# Patient Record
Sex: Male | Born: 1955
Health system: Southern US, Community
[De-identification: ages and names within clinical notes are randomized; demographics above are authoritative.]

## PROBLEM LIST (undated history)

## (undated) DIAGNOSIS — K219 Gastro-esophageal reflux disease without esophagitis: Secondary | ICD-10-CM

## (undated) DIAGNOSIS — M1990 Unspecified osteoarthritis, unspecified site: Secondary | ICD-10-CM

## (undated) DIAGNOSIS — Z01810 Encounter for preprocedural cardiovascular examination: Secondary | ICD-10-CM

## (undated) DIAGNOSIS — T8859XA Other complications of anesthesia, initial encounter: Secondary | ICD-10-CM

## (undated) DIAGNOSIS — I10 Essential (primary) hypertension: Secondary | ICD-10-CM

## (undated) DIAGNOSIS — G709 Myoneural disorder, unspecified: Secondary | ICD-10-CM

## (undated) DIAGNOSIS — J45909 Unspecified asthma, uncomplicated: Secondary | ICD-10-CM

## (undated) DIAGNOSIS — R519 Headache, unspecified: Secondary | ICD-10-CM

## (undated) DIAGNOSIS — T4145XA Adverse effect of unspecified anesthetic, initial encounter: Secondary | ICD-10-CM

## (undated) DIAGNOSIS — G8921 Chronic pain due to trauma: Secondary | ICD-10-CM

## (undated) HISTORY — PX: NECK SURGERY: SHX720

## (undated) HISTORY — PX: OTHER SURGICAL HISTORY: SHX169

## (undated) HISTORY — PX: ELBOW SURGERY: SHX618

## (undated) HISTORY — DX: Encounter for preprocedural cardiovascular examination: Z01.810

---

## 1990-05-14 HISTORY — PX: BACK SURGERY: SHX140

## 1998-07-19 ENCOUNTER — Encounter: Payer: Self-pay | Admitting: Family Medicine

## 1998-07-19 ENCOUNTER — Ambulatory Visit (HOSPITAL_COMMUNITY): Admission: RE | Admit: 1998-07-19 | Discharge: 1998-07-19 | Payer: Self-pay | Admitting: Family Medicine

## 2000-09-20 ENCOUNTER — Encounter: Payer: Self-pay | Admitting: Family Medicine

## 2000-09-20 ENCOUNTER — Ambulatory Visit (HOSPITAL_COMMUNITY): Admission: RE | Admit: 2000-09-20 | Discharge: 2000-09-20 | Payer: Self-pay | Admitting: Family Medicine

## 2001-01-31 ENCOUNTER — Ambulatory Visit (HOSPITAL_COMMUNITY): Admission: RE | Admit: 2001-01-31 | Discharge: 2001-01-31 | Payer: Self-pay | Admitting: Gastroenterology

## 2001-05-31 ENCOUNTER — Ambulatory Visit (HOSPITAL_COMMUNITY): Admission: RE | Admit: 2001-05-31 | Discharge: 2001-05-31 | Payer: Self-pay | Admitting: Family Medicine

## 2001-05-31 ENCOUNTER — Encounter: Payer: Self-pay | Admitting: Family Medicine

## 2001-12-08 ENCOUNTER — Emergency Department (HOSPITAL_COMMUNITY): Admission: EM | Admit: 2001-12-08 | Discharge: 2001-12-08 | Payer: Self-pay | Admitting: Emergency Medicine

## 2001-12-08 ENCOUNTER — Encounter: Payer: Self-pay | Admitting: Emergency Medicine

## 2002-10-16 ENCOUNTER — Ambulatory Visit (HOSPITAL_COMMUNITY): Admission: RE | Admit: 2002-10-16 | Discharge: 2002-10-16 | Payer: Self-pay | Admitting: Gastroenterology

## 2004-05-14 DIAGNOSIS — M21371 Foot drop, right foot: Secondary | ICD-10-CM | POA: Insufficient documentation

## 2004-05-14 HISTORY — DX: Foot drop, right foot: M21.371

## 2005-07-31 ENCOUNTER — Encounter: Admission: RE | Admit: 2005-07-31 | Discharge: 2005-07-31 | Payer: Self-pay | Admitting: Neurological Surgery

## 2005-08-08 ENCOUNTER — Ambulatory Visit (HOSPITAL_COMMUNITY): Admission: RE | Admit: 2005-08-08 | Discharge: 2005-08-09 | Payer: Self-pay | Admitting: Neurological Surgery

## 2005-10-15 ENCOUNTER — Encounter: Admission: RE | Admit: 2005-10-15 | Discharge: 2005-10-15 | Payer: Self-pay | Admitting: Neurological Surgery

## 2006-02-04 ENCOUNTER — Encounter: Admission: RE | Admit: 2006-02-04 | Discharge: 2006-02-04 | Payer: Self-pay | Admitting: Neurological Surgery

## 2007-02-03 ENCOUNTER — Encounter: Admission: RE | Admit: 2007-02-03 | Discharge: 2007-05-04 | Payer: Self-pay | Admitting: Orthopaedic Surgery

## 2007-04-17 ENCOUNTER — Encounter: Admission: RE | Admit: 2007-04-17 | Discharge: 2007-07-16 | Payer: Self-pay | Admitting: Orthopaedic Surgery

## 2007-07-17 ENCOUNTER — Encounter: Admission: RE | Admit: 2007-07-17 | Discharge: 2007-07-22 | Payer: Self-pay | Admitting: Orthopaedic Surgery

## 2007-07-21 ENCOUNTER — Encounter: Admission: RE | Admit: 2007-07-21 | Discharge: 2007-07-21 | Payer: Self-pay | Admitting: Neurological Surgery

## 2007-07-24 ENCOUNTER — Observation Stay (HOSPITAL_COMMUNITY): Admission: RE | Admit: 2007-07-24 | Discharge: 2007-07-25 | Payer: Self-pay | Admitting: Neurological Surgery

## 2007-08-19 ENCOUNTER — Encounter: Admission: RE | Admit: 2007-08-19 | Discharge: 2007-08-19 | Payer: Self-pay | Admitting: Neurological Surgery

## 2007-09-01 ENCOUNTER — Encounter: Admission: RE | Admit: 2007-09-01 | Discharge: 2007-09-25 | Payer: Self-pay | Admitting: Anesthesiology

## 2007-09-10 ENCOUNTER — Encounter: Admission: RE | Admit: 2007-09-10 | Discharge: 2007-09-10 | Payer: Self-pay | Admitting: Orthopedic Surgery

## 2007-09-17 ENCOUNTER — Encounter: Admission: RE | Admit: 2007-09-17 | Discharge: 2007-09-17 | Payer: Self-pay | Admitting: Neurological Surgery

## 2007-09-22 ENCOUNTER — Encounter: Admission: RE | Admit: 2007-09-22 | Discharge: 2007-09-22 | Payer: Self-pay | Admitting: Neurological Surgery

## 2007-10-16 ENCOUNTER — Ambulatory Visit (HOSPITAL_COMMUNITY): Admission: RE | Admit: 2007-10-16 | Discharge: 2007-10-16 | Payer: Self-pay | Admitting: Neurological Surgery

## 2007-12-10 ENCOUNTER — Encounter
Admission: RE | Admit: 2007-12-10 | Discharge: 2007-12-11 | Payer: Self-pay | Admitting: Physical Medicine & Rehabilitation

## 2007-12-11 ENCOUNTER — Ambulatory Visit: Payer: Self-pay | Admitting: Physical Medicine & Rehabilitation

## 2008-02-18 ENCOUNTER — Encounter (INDEPENDENT_AMBULATORY_CARE_PROVIDER_SITE_OTHER): Payer: Self-pay | Admitting: Gastroenterology

## 2008-02-18 ENCOUNTER — Ambulatory Visit (HOSPITAL_COMMUNITY): Admission: RE | Admit: 2008-02-18 | Discharge: 2008-02-18 | Payer: Self-pay | Admitting: Gastroenterology

## 2008-05-03 ENCOUNTER — Encounter: Admission: RE | Admit: 2008-05-03 | Discharge: 2008-05-03 | Payer: Self-pay | Admitting: Neurological Surgery

## 2008-05-03 IMAGING — CT CT EXTREM LOW W/O CM*L*
4 series · 11 of 20 positions shown, 12 images · non-contrast
Comparison: None

CLINICAL DATA: Calcaneal fracture 1 year ago.  Persistent
ankle/foot pain

CT LEFT FOOT WITHOUT CONTRAST
TECHNIQUE: Multidetector CT imaging of the left foot was performed
according to the standard protocol without intravenous contrast.
Multiplanar CT image reconstructions were also generated.

[Series 4: lower ext detail · axial · 0.45mm/px · z∈[-51,+26]mm · 3 of 63 slices shown]
[im 16/63  bone]
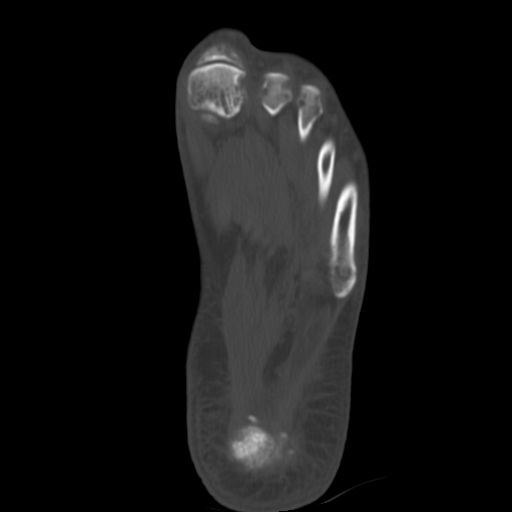
[im 32/63  bone]
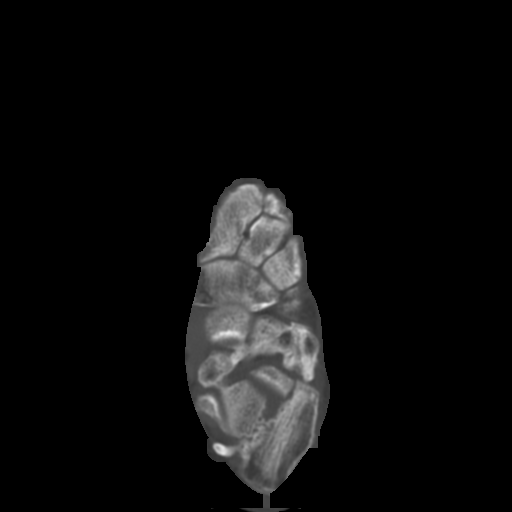
[im 47/63  bone]
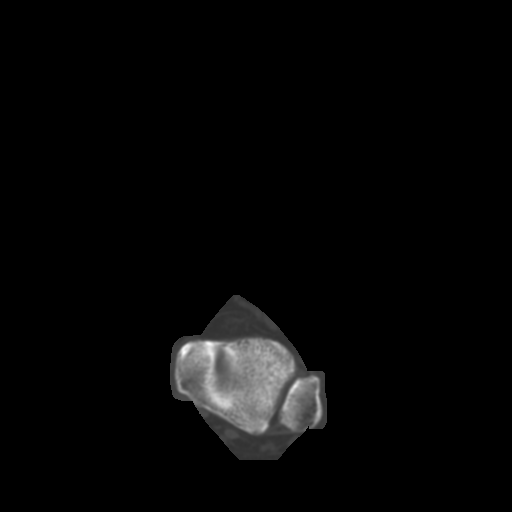

[Series 200: cor lt foot · axial · 0.45mm/px · z∈[-108,-57]mm · 3 of 57 slices shown, 4 images]
[im 15/57  soft-tissue]
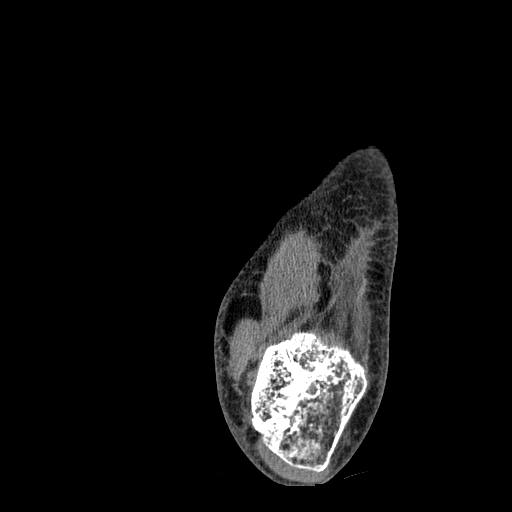
[im 15/57  bone]
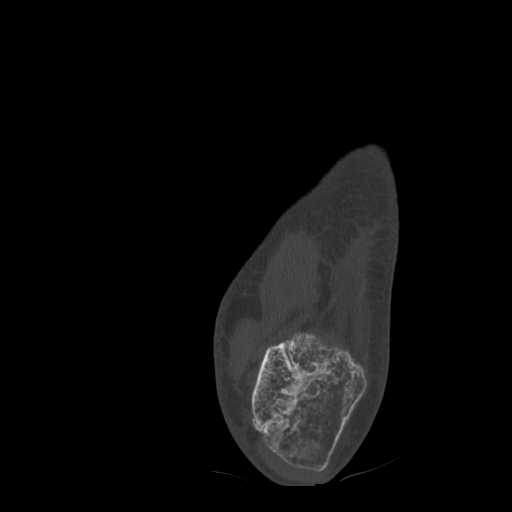
[im 29/57  bone]
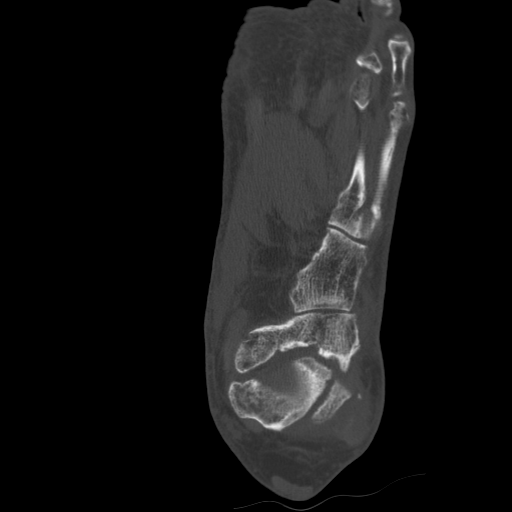
[im 43/57  bone]
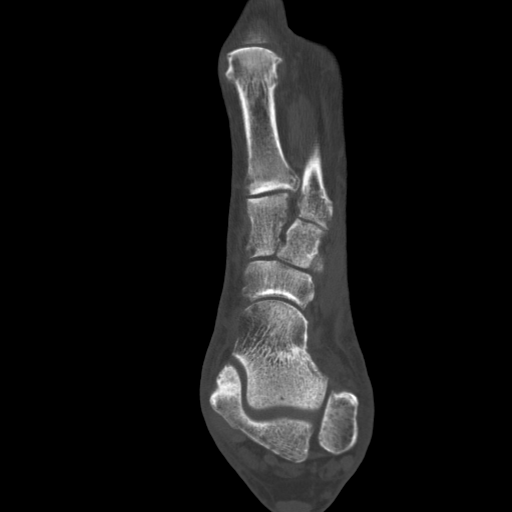

[Series 201: sag lt foot · sagittal · 0.45mm/px · 2 of 47 slices shown]
[im 16/47  bone]
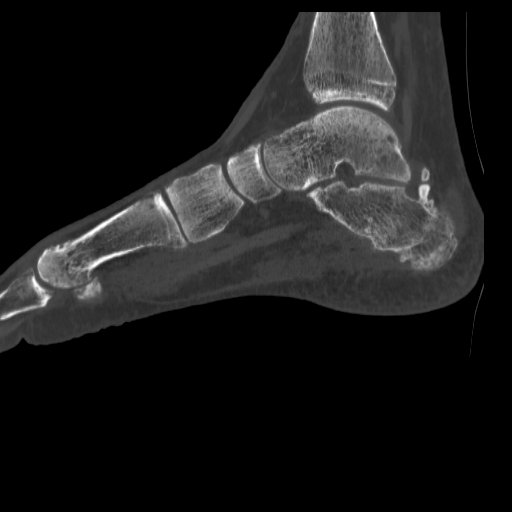
[im 31/47  bone]
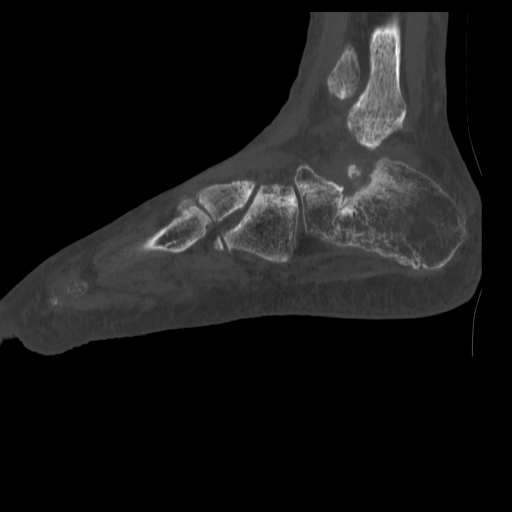

[Series 202: axial lt foot · coronal · 0.45mm/px · 3 of 81 slices shown]
[im 17/81  bone]
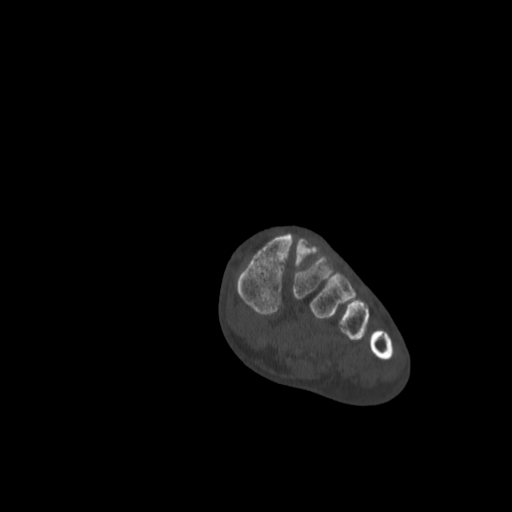
[im 33/81  bone]
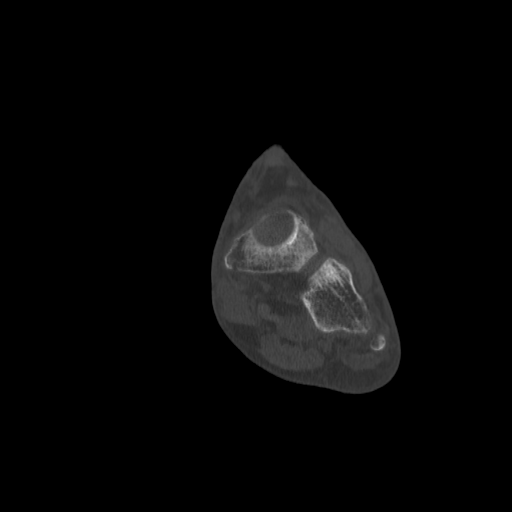
[im 49/81  bone]
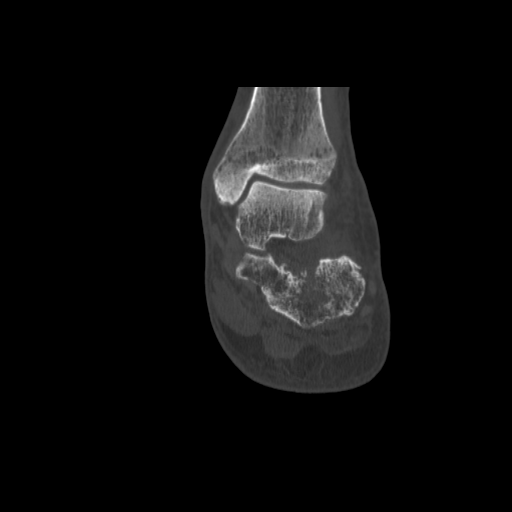

[11 of 20 positions shown; findings below may reference images not displayed]

FINDINGS: There is diffuse, aggressive osteoporosis.  There is a
complex calcaneus fracture with central depression of the posterior
subtalar joint.  Is a large gap centrally which is splenic
calcaneus.  This measures approximate 13 mm.  The medial aspect of
the posterior talocalcaneal facet is intact.  The lateral portion
is depressed and separated.  The oblique fracture of the posterior
calcaneus is healed.  The middle facet is maintained.  There is an
ununited fracture through the base of the anterior process
laterally.  The medial portion is healed.  The tibiotalar joint
spaces maintained.  No osteochondral lesions.  There is a lateral
calcaneal bone spur noted but this is not near the peroneal
tendons.
IMPRESSION: 1.  The central portion of the calcaneal fracture is depressed and
ununited as discussed above.  The sustentaculum is intact.
2.  Disuse osteoporosis.

## 2008-05-10 ENCOUNTER — Encounter
Admission: RE | Admit: 2008-05-10 | Discharge: 2008-08-08 | Payer: Self-pay | Admitting: Physical Medicine & Rehabilitation

## 2008-05-25 ENCOUNTER — Ambulatory Visit: Payer: Self-pay | Admitting: Physical Medicine & Rehabilitation

## 2008-06-21 ENCOUNTER — Ambulatory Visit: Payer: Self-pay | Admitting: Physical Medicine & Rehabilitation

## 2008-06-24 ENCOUNTER — Encounter
Admission: RE | Admit: 2008-06-24 | Discharge: 2008-09-22 | Payer: Self-pay | Admitting: Physical Medicine & Rehabilitation

## 2008-06-24 ENCOUNTER — Ambulatory Visit: Payer: Self-pay | Admitting: Psychology

## 2008-07-19 ENCOUNTER — Ambulatory Visit: Payer: Self-pay | Admitting: Physical Medicine & Rehabilitation

## 2008-08-18 ENCOUNTER — Encounter
Admission: RE | Admit: 2008-08-18 | Discharge: 2008-11-16 | Payer: Self-pay | Admitting: Physical Medicine & Rehabilitation

## 2008-08-23 ENCOUNTER — Ambulatory Visit: Payer: Self-pay | Admitting: Physical Medicine & Rehabilitation

## 2008-09-20 ENCOUNTER — Ambulatory Visit: Payer: Self-pay | Admitting: Physical Medicine & Rehabilitation

## 2008-10-19 ENCOUNTER — Ambulatory Visit: Payer: Self-pay | Admitting: Physical Medicine & Rehabilitation

## 2008-11-18 ENCOUNTER — Encounter: Admission: RE | Admit: 2008-11-18 | Discharge: 2009-02-16 | Payer: Self-pay | Admitting: Anesthesiology

## 2008-11-23 ENCOUNTER — Ambulatory Visit: Payer: Self-pay | Admitting: Anesthesiology

## 2008-12-07 ENCOUNTER — Ambulatory Visit: Payer: Self-pay | Admitting: Anesthesiology

## 2009-01-10 ENCOUNTER — Encounter
Admission: RE | Admit: 2009-01-10 | Discharge: 2009-01-10 | Payer: Self-pay | Admitting: Physical Medicine & Rehabilitation
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
## 2010-09-26 NOTE — Procedures (Signed)
NAMENORMAN, PIACENTINI                ACCOUNT NO.:  [id number]   MEDICAL RECORD NO.:  [id number]           PATIENT TYPE:   LOCATION:                                 FACILITY:   PHYSICIAN:  Erick Colace, M.D.DATE OF BIRTH:  05/16/1955   DATE OF PROCEDURE:  DATE OF DISCHARGE:                               OPERATIVE REPORT   PROCEDURE:  A left L4, L3, and L2 medial branch blocks as well as  radiofrequency neurotomy under fluoroscopic guidance.   INDICATIONS:  Lumbar postlaminectomy syndrome with pain above the level  of the L5-S1 fusion.   Informed consent was obtained after describing risks and benefits of the  procedure with the patient.  These include bleeding, bruising, and  infection.  He elects to proceed and has given written consent.  He has  had 2 positive responses with the medial branch block with greater than  50% improvement.  He has had good results on the right side with  radiofrequency neurotomy done at corresponding levels 1 month ago.  His  pain is only partially responsive to medication management and not any  conservative care.   Informed consent was obtained after describing risks and benefits of the  procedure with the patient.  These include bleeding, bruising, and  infection.  He elects to proceed and has given written consent.  The  patient placed prone on fluoroscopy table.  Betadine prep, sterile  drape.  A 25-gauge inch and a half needle was used to anesthetize the  skin and subcu tissue, 1% lidocaine x2 mL.  Then, a 20-gauge 10-cm RF  needle with 10-mm curved active tip was inserted under fluoroscopic  guidance targeting at the left L5 SAP transverse process junction.  Bone  contact made, confirmed with lateral imaging.  Sensory stim at 50 Hz  followed by motor stim at 2 Hz confirmed proper needle location followed  by injection of 1 mL of a solution containing 1 mL of 4 mg/mL  dexamethasone, 2 mL of 2% MPF lidocaine followed by radiofrequency  lesioning at 70-degree Celsius for 70 seconds.  Then, the right L4 SAP  transverse process junction targeted, bone contact made, confirmed with  lateral imaging.  Sensory stim at 50 Hz followed by motor stim at 2 Hz  confirmed proper needle location followed by injection of 1 mL of  dexamethasone lidocaine solution and radiofrequency lesioning 70 degrees  Celsius for 70 seconds.  Then, the right L3 SAP transverse junction  targeted, bone contact made, confirmed with lateral imaging.  Sensory  stem at 50 Hz followed by motor stem at 2 Hz confirmed proper needle  location followed by injection of 1 mL of the dexamethasone lidocaine  solution and radiofrequency lesioning 70 degrees Celsius for 70 seconds.  The patient tolerated the procedure well.  Pre and postinjection vitals  stable.  Postinjection instructions given.  I will see him back in 1  month for a followup appointment.      Erick Colace, M.D.  Electronically Signed     AEK/MEDQ  D:  09/20/2008 12:44:58  T:  09/21/2008 03:08:05  Job:  [id number]

## 2010-09-26 NOTE — Procedures (Signed)
[name], [name] NO.:  [id number]   MEDICAL RECORD NO.:  [id number]          PATIENT TYPE:  REC   LOCATION:  [facility]                          FACILITY:  [facility]   PHYSICIAN:  Celene Kras, MD        DATE OF BIRTH:  12-05-1955   DATE OF PROCEDURE:  12/07/2008  DATE OF DISCHARGE:                               OPERATIVE REPORT   [name] comes to the pain management today.  I evaluated him via  health and history form 14-point review of systems benefited from facet  intervention.  I am going to reinforce longer-acting local anesthetic.  Benchmarks were given, right side, 4, 5, 6, and 7, contributory elements  at 3, risks, complications, and options are fully outlined, independent  needle access points.  No interval change in exam.   IMPRESSION:  Spondylosis, cervical spine.   PLAN:  Facet medial branch intervention.  Consider RF.  Follow up with  him to determine further course of care.  He is consented.   The patient was taken to the fluoroscopy suite and was placed in the  supine position.  The neck prepped and draped in usual fashion.  Right  side prepped, under local anesthetic.  I advanced 25-gauge needle to the  facet mentioned, confirmed independent needle access points, and  injected 0.5 mL of Marcaine with 0.5% MPF at each level with total 40 of  Aristocort in divided dose.   He tolerated the procedure well.  No complications from the procedure.  Appropriate recovery.           ______________________________  Celene Kras, MD     HH/MEDQ  D:  12/07/2008 12:00:02  T:  12/08/2008 01:17:05  Job:  [id number]

## 2010-09-26 NOTE — Procedures (Signed)
NAMERIGDON, MACOMBER NO.:  [id number]   MEDICAL RECORD NO.:  [id number]           PATIENT TYPE:   LOCATION:                                 FACILITY:   PHYSICIAN:  Erick Colace, M.D.DATE OF BIRTH:  Dec 20, 1955   DATE OF PROCEDURE:  06/21/2008  DATE OF DISCHARGE:                               OPERATIVE REPORT   PROCEDURE:  This is a bilateral L2, L3, L4 medial branch blocks under  fluoroscopic guidance.   INDICATIONS:  Lumbar post laminectomy syndrome and he has L5-S1 fusion.   Pain is only partially response to medication management.  Given his  brain injury, overall trying to avoid narcotic analgesics.   The patient is not on any blood thinners or antibiotics at the current  time.   Informed consent was obtained after describing risks and benefits of the  procedure with the patient.  These include bleeding, bruising, and  infection.  He elects to proceed and has given written consent.  The  patient placed prone on fluoroscopy table.  Betadine prep, sterile  drape, 25-gauge 1-1/2-inch needle was used to anesthetize the skin and  subcutaneous tissue, 1% lidocaine x2 mL, a 22-gauge, 3-1/2-inch spinal  needle was inserted under fluoroscopic guidance, targeting the SAP  transverse process junction, first at the L4 on the left side.  AP and  lateral oblique images were utilized.  Bone contact made.  Omnipaque 180  x 0.5 under live fluoro demonstrated no intravascular uptake and 0.5 mL  of solution containing 1 mL of 4 mg/mL dexamethasone and 2 mL of 2% MPF  lidocaine was injected.  Then, left L3 SAP transverse process junction,  targeted bone contact made, confirmed with lateral imaging.  Omnipaque  180 under live fluoro demonstrated no intravascular uptake.  Then, 0.5  of dexamethasone lidocaine solution was injected.  Then, the left L5 SAP  transverse process junction, targeted bone contact made, confirmed with  lateral imaging.  Omnipaque 180 under live  fluoro demonstrated no  intravascular uptake.  Then, 0.5 mL of dexamethasone lidocaine solution  was injected.  The same procedure was repeated on the right side at  corresponding levels using same needle injectate and technique.  The  patient tolerated the procedure well.  Pre- and post-injection vitals  stable.  Pre-injection pain level 5-6/10, post injection 5/10.   We will track overtime, if no significant relief, then we will send him  up for sacroiliac injections.      Erick Colace, M.D.  Electronically Signed     AEK/MEDQ  D:  06/21/2008 10:55:29  T:  06/22/2008 01:15:37  Job:  [id number]
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
## 2010-09-26 NOTE — Procedures (Signed)
NAMEJESHURUN, [name] NO.:  [id number]   MEDICAL RECORD NO.:  [id number]          PATIENT TYPE:  REC   LOCATION:  [facility]                         FACILITY:  [facility]   PHYSICIAN:  Erick Colace, M.D.DATE OF BIRTH:  09-09-1955   DATE OF PROCEDURE:  07/19/2008  DATE OF DISCHARGE:                               OPERATIVE REPORT   This is a medial branch block, bilateral L2, L3, L4 under fluoroscopic  guidance.   INDICATIONS:  Lumbar postlaminectomy syndrome and L5-S1 non-instrumented  fusion.   Pain is only partially responsive to medication management.  Given the  history of brain injury, trying to avoid narcotic analgesic medications.  Not on any blood thinners or antibiotics at the current time.   Informed consent was obtained after describing risks and benefits of the  procedure to the patient.  These include bleeding, bruising, infection.  He elects to proceed and has given written consent.  The patient was  placed prone on fluoroscopy table.  Betadine prep, sterile drape.  A 25-  gauge inch and a half needle was used to anesthetize the skin and  subcutaneous tissue, 1% lidocaine x2 mL.  A 22-gauge 3-1/2-inch spinal  needle was inserted under fluoroscopic guidance targeting the SAP  transverse process junction first at L4 on the right side.  AP, lateral,  oblique images utilized.  Bone contact made.  Omnipaque 180 under live  fluoro demonstrated no intravascular uptake x0.5 mL.  Then, a solution  containing 1 mL of 40 mg/mL dexamethasone and 2 mL of 2% MPF lidocaine  x0.5 mL injected.  Then, the right L3 SAP transverse process junction  targeted, bone contact made, confirmed with lateral imaging.  Omnipaque  180 under live fluoro demonstrated no intravascular uptake.  Then, 0.5  mL of the dexamethasone-lidocaine solution was injected in the right L5  SAP transverse junction targeted, bone contact made, confirmed with  lateral imaging.  Omnipaque 180 x0.5 mL  demonstrated no intravascular  uptake.  Then, 0.5 mL of the dexamethasone-lidocaine solution was  injected.  The same procedure was repeated on the left side at the same  corresponding levels using same needle, injectate and technique.  The  patient tolerated the procedure well.  Pre and postinjection vitals  stable.  His prior injection relief was 50% lasting for about 1 week.  We will schedule for radiofrequency pending outcome of these results.  Getting up from the table at the last visit he did not have an immediate  effect.       Erick Colace, M.D.  Electronically Signed     AEK/MEDQ  D:  07/19/2008 10:05:41  T:  07/19/2008 22:18:01  Job:  [id number]

## 2010-09-26 NOTE — Procedures (Signed)
[name], SCHMALE NO.:  [id number]   MEDICAL RECORD NO.:  [id number]          PATIENT TYPE:  REC   LOCATION:  [facility]                          FACILITY:  [facility]   PHYSICIAN:  Erick Colace, M.D.DATE OF BIRTH:  13-Dec-1955   DATE OF PROCEDURE:  08/23/2008  DATE OF DISCHARGE:                               OPERATIVE REPORT   This is a right L4 medial branch radiofrequency neurotomy, right L3  medial branch radiofrequency neurotomy, right L2 medial branch  radiofrequency neurotomy under fluoroscopic guidance.   INDICATIONS:  Lumbar post laminectomy syndrome with pain above the level  of the L5-S1 fusion.   Informed consent was obtained after describing risks and benefits of the  procedure with the patient.  These include bleeding, bruising,  infection, he elects to proceed and has given written consent.  He has  had 2 positive responses to medial branch block with greater than 50%  improvement on 2 separate occasions.  His pain does interfere with  activities and is only partially responsive to medication management.   Informed consent was obtained describing the risks and benefits of  procedure with the patient.  These include bleeding, bruising,  infection.  He elects proceed and has given written consent.  The  patient placed prone on fluoroscopy table with Betadine prep, sterilely  draped, a 25-gauge, 1-1/2-inch needle was used to anesthetize the skin  and subcutaneous tissue  with 1% lidocaine x2 mL.  Then a 20-gauge 10-cm  RF needle with 10 mm curved active tip was inserted under fluoroscopic  guidance starting in the right L5 SAP transverse process junction, bone  contact made confirmed with lateral imaging.  Sensory stem at 50 Hz  followed by motor stem at 2 Hz confirmed proper needle location followed  by injection of 1 mL of the solution containing 1 mL of 4 mg/mL  dexamethasone, 2 mL of 2% MPF lidocaine followed by 2 mL of 1% MPF  lidocaine followed  by radiofrequency lesioning 70 degrees Celsius for 70  seconds.  Then, the right L4 SAP transverse process junction targeted  bone contact made confirmed with lateral imaging.  Sensory stem at 50 Hz  followed by motor stem at 2 Hz confirmed proper needle location followed  by injection of 1 mL of dexamethasone lidocaine solution and  radiofrequency lesioning 70 degrees Celsius for 70 seconds.  Then, the  right L3 SAP transverse process junction targeted bone contact made  confirmed with lateral imaging.  Sensory stem at 50 Hz followed by motor  stem at 2 Hz confirmed proper needle location followed by injection of 1  mL of dexamethasone lidocaine solution and radiofrequency lesioning 70  degrees Celsius for 70 seconds.  The patient tolerated the procedure  well.  Pre and post injection vitals stable.  Post injection  instructions given.  I will see him back in 1 month for the left side.      Erick Colace, M.D.  Electronically Signed     AEK/MEDQ  D:  08/23/2008 14:17:12  T:  08/24/2008 04:34:25  Job:  [id number]
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
## 2010-09-26 NOTE — Op Note (Signed)
[name], BOLLIG NO.:  [id number]   MEDICAL RECORD NO.:  [id number]          PATIENT TYPE:  AMB   LOCATION:  ENDO                         FACILITY:  [facility]   PHYSICIAN:  Danise Edge, M.D.   DATE OF BIRTH:  [birth year]   DATE OF PROCEDURE:  02/18/2008  DATE OF DISCHARGE:                               OPERATIVE REPORT   REFERRING PHYSICIAN:  Holley Bouche, MD   PROCEDURE INDICATIONS:  Mr. Ezreal Turay is a 55 year old male born on  15-Nov-1955.  Mr. Poage is undergoing diagnostic colonoscopy to  evaluate painless hematochezia.   Mr. Knighton father was diagnosed with colon cancer when his father was in  his 77s.  On January 31, 2001, Mr. Goebel Blagg's proctocolonoscopy to  the cecum was normal.   PAST MEDICAL HISTORY:  1. Hypertension.  2. Multiple orthopedic procedures.  3. Gastroesophageal reflux.  4. Peptic ulcer disease by October 16, 2002.  5. Esophagogastroduodenoscopy.   ENDOSCOPIST:  Danise Edge, MD   PREMEDICATION:  1. Fentanyl 100 mcg.  2. Versed 10 mg.   PROCEDURE:  After obtaining informed consent, Mr. Botero was placed in the  left lateral decubitus position.  I administered intravenous fentanyl  and intravenous Versed to achieve conscious sedation for the procedure.  The patient's blood pressure, oxygen saturation, and cardiac rhythm were  monitored throughout the procedure and documented in the medical record.   Anal inspection was normal.  Digital rectal exam was normal.  The  prostate was somewhat enlarged but nonnodular.  The Pentax pediatric  colonoscope was introduced into the rectum and advanced to the cecum.  A  normal-appearing ileocecal valve and appendiceal orifice were  identified.  Colonic preparation for the exam today was good.   Rectum normal.  Retroflexed view of the distal rectum normal.   Sigmoid colon and descending colon normal.   Splenic flexure.  A 2-mm sessile polyp was removed from the splenic  flexure with the cold biopsy forceps.   Transverse colon normal.   Hepatic flexure normal.   Ascending colon.  From the proximal ascending colon, a 2-mm sessile  polyp was removed with the cold biopsy forceps.   Cecum and ileocecal valve normal.   ASSESSMENT:  A small polyp was removed from the ascending colon and a  small polyp was removed from the splenic flexure.  Both polyps were  submitted in one bottle for pathological evaluation.   RECOMMENDATIONS:  Repeat surveillance colonoscopy in 5 years.           ______________________________  Danise Edge, M.D.     MJ/MEDQ  D:  02/18/2008  T:  02/18/2008  Job:  [id number]   cc:   Holley Bouche, M.D.
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
## 2010-09-26 NOTE — Op Note (Signed)
[name], SWEETLAND NO.:  [id number]   MEDICAL RECORD NO.:  [id number]          PATIENT TYPE:  AMB   LOCATION:  SDS                          FACILITY:  [facility]   PHYSICIAN:  Tia Alert, MD     DATE OF BIRTH:  [birth year]   DATE OF PROCEDURE:  10/16/2007  DATE OF DISCHARGE:  10/16/2007                               OPERATIVE REPORT   PREOPERATIVE DIAGNOSIS:  Right peroneal neuropathy with right foot drop.   POSTOPERATIVE DIAGNOSIS:  Right peroneal neuropathy with right foot  drop.   PROCEDURE:  Right peroneal nerve decompression.   SURGEON:  Tia Alert, MD   ANESTHESIA:  General endotracheal.   COMPLICATIONS:  None apparent.   INDICATIONS FOR PROCEDURE:  [name] is a 55 year old gentleman who had  acute onset of a right foot drop.  He had an MRI, which showed no  significant pathology at L4-L5.  He had a nerve conduction study with  EMG, which showed a significant right peroneal neuropathy.  I  recommended a peroneal nerve decompression.  He understood the risks,  benefits, expected outcome, and wished to proceed.   DESCRIPTION OF THE PROCEDURE:  The patient was taken to operating room  and after induction of adequate generalized endotracheal anesthesia, he  was placed in a supine position on the operating table with a chest roll  under his right back.  His right knee, thigh, and calf were prepped with  DuraPrep and draped in the usual sterile fashion.  The fibular head was  identified and a curvilinear incision was made just lateral to the  fibular head in line with the typical distribution of the peroneal  nerve.  I dissected through the subcutaneous tissue.  Self-retaining  retractors were placed.  I opened the fascia and identified the right  peroneal nerve.  I then dissected between the fascia and the nerve  proximally until it was fully decompressed.  This fascia was quite tight  on the nerve.  It seemed to be flattened and compressed by  this.  I then  dissected distally past the fibular head into its deep and superficial  branches.  As I opened the fascia and the musculature to identify this,  I could pass instrument easily both proximally and distally.  I then  opened the fascia over the fibular head and with a Leksell rongeur  removed the lateral surface of the fibular head and then this was waxed  with bone wax.  I then irrigated with saline solution containing  bacitracin.  I inspected the nerve once again; it looked quite free and  good, and I encircled it with vessel loops.  I was able to mobilize the  nerve this way.  I then removed the vessel loops, irrigated once again,  and then closed the subcutaneous tissues with  2-0 Vicryl, the subcuticular tissues with 3-0 Vicryl and closed the skin  with benzoin and Steri-Strips.  The drapes were removed.  Sterile  dressing was applied.  The patient was awakened from general anesthesia  and transferred to recovery room in stable condition.  At the end of  procedure, all sponge, needle, and instrument counts were correct.      Tia Alert, MD  Electronically Signed     DSJ/MEDQ  D:  10/16/2007  T:  10/17/2007  Job:  [id number]

## 2010-09-29 NOTE — Op Note (Signed)
NAME:  [name], [name] NO.:  [id number]   MEDICAL RECORD NO.:  [id number]                   PATIENT TYPE:  AMB   LOCATION:  ENDO                                 FACILITY:  [facility]   PHYSICIAN:  Danise Edge, M.D.                DATE OF BIRTH:  08/07/1955   DATE OF PROCEDURE:  10/16/2002  DATE OF DISCHARGE:  10/16/2002                                 OPERATIVE REPORT   REFERRING PHYSICIAN:  Noberto Retort, M.D.   PROCEDURE:  Esophagogastroduodenoscopy.   PROCEDURE INDICATION:  [name] is a 55 year old male, born [birth year].  [name] is scheduled to undergo an esophagogastroduodenoscopy to  evaluate the passage of melenic-appearing stool.   January 31, 2001, [name]'s esophagogastroduodenoscopy and  proctocolonoscopy to the cecum were normal.  [name] father had colon  cancer.  [name] is scheduled for screening colonoscopy with polypectomy in  September 2007.   ENDOSCOPIST:  Charolett Bumpers, M.D.   PREMEDICATION:  1. Versed 10 mg.  2. Demerol 100 mg.   DESCRIPTION OF PROCEDURE:  After obtaining informed consent, [name] was  placed in the left lateral decubitus position.  I administered intravenous  Demerol and intravenous Versed to achieve conscious sedation for the  procedure.  The patient's blood pressure, oxygen saturation, and cardiac  rhythm were monitored throughout the procedure and documented in the medical  record.   The Olympus gastroscope was passed through the posterior hypopharynx into  the proximal esophagus without difficulty.  The hypopharynx, larynx appeared  normal.  I did not visualize the vocal cords.   ESOPHAGOSCOPY:  The proximal, mid, and lower segments of the esophageal  mucosa appear normal.   GASTROSCOPY:  Retroflexed view of the gastric cardia and fundus was normal.  The gastric body appeared normal.  In the very distal gastric antrum,  probably extending a short distance into the  pyloric channel is a 3 mm x 3  mm ulcer with sharp margins and flat exudative base.  There is no visible  vessel.  This lesion is at very low risk for significant re-bleeding.   DUODENOSCOPY:  The duodenal bulb, mid duodenum, and distal duodenum appear  normal.   ASSESSMENT:  Prepyloric ulcer in the distal gastric antrum at low risk for  significant bleeding.  CLOtest to rule out Helicobacter pylori antral  gastritis pending.    PLAN:  I will start [name] on Prilosec 20 mg each morning for six weeks to  heal his prepyloric antral ulcer.  If his CLOtest is positive for  Helicobacter pylori antral gastritis, he will need therapy.  Danise Edge, M.D.    MJ/MEDQ  D:  10/16/2002  T:  10/18/2002  Job:  [id number]   cc:   Melida Quitter, M.D.  [address]  [place]  [place] [address]  Fax: [phone]
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
## 2010-09-29 NOTE — Op Note (Signed)
NAMEJAISHON, KRISHER                ACCOUNT NO.:  [id number]   MEDICAL RECORD NO.:  [id number]          PATIENT TYPE:  AMB   LOCATION:  SDS                          FACILITY:  [facility]   PHYSICIAN:  Tia Alert, MD     DATE OF BIRTH:  1955/12/08   DATE OF PROCEDURE:  08/08/2005  DATE OF DISCHARGE:                                 OPERATIVE REPORT   PREOPERATIVE DIAGNOSIS:  Cervical spondylosis with cervical spinal stenosis  C5-6 and C6-7 with neck and right arm pain.   POSTOPERATIVE DIAGNOSIS:  Cervical spondylosis with cervical spinal stenosis  C5-6 and C6-7 with neck and right arm pain.   PROCEDURES:  1.  Decompressive anterior cervical diskectomy C5-6, C6-7 for central canal      nerve root decompression.  2.  Anterior cervical arthrodesis C5-6, C6-7 utilizing 7 mm PEEK interbody      cages packed with local morselized autograft mixed with DBX putty.  3.  Anterior cervical plating C5-C7 utilizing 42.5 mm Venture plate.   SURGEON:  Dr. Marikay Alar.   ASSISTANT:  Dr. Donalee Citrin.   ANESTHESIA:  General endotracheal.   COMPLICATIONS:  None apparent.   INDICATIONS FOR PROCEDURE:  Mr. Griep is a 55 year old white male who  presented with neck pain and right arm pain.  He had an MRI which showed  cervical spondylosis with degenerative disease at C5-6 with foraminal  stenosis and moderate canal stenosis.  He had severe canal stenosis at C6-7  with bilateral foraminal stenosis.  I recommended decompressive anterior  cervical diskectomy with fusion plating C5-6 and C6-7.  He understood the  risks, benefits, expected outcome and wished to proceed.   DESCRIPTION OF PROCEDURE:  The patient was taken to operating room and after  induction of adequate generalized endotracheal anesthesia he was placed the  supine position on the operating table.  His right anterior cervical region  was prepped with DuraPrep and draped in usual sterile fashion.  5 mL local  anesthesia was injected and  incision was made to the right of midline down  to the platysma muscle which was elevated, opened and undermined with  Metzenbaum scissors.  I then dissected in a plane medial to the  sternocleidomastoid muscle and internal carotid artery and lateral to the  trachea and esophagus to expose C5-6 and C6-7.  There was a large anterior  osteophyte at C5-6.  We localized this level with fluoroscopy and then took  down the longus colli muscles bilaterally and placed shadow line retractors  under these to expose C5-6, C6-7.  The annulus was incised bilaterally after  the anterior osteophyte was removed with a Leksell rongeur at C5-6.  The  initial diskectomies done with pituitary rongeurs and curved curettes.  The  high-speed drill was brought into the field and each disk space was drilled  and the drill shavings were saved in a mucous trap for later arthrodesis.  We drilled down to the level of the posterior longitudinal ligament at each  at each level.  He had large posterior spurs which were drilled using the  high-speed  drill.  We then brought in the operative microscope started C5-6  level.  We opened the patient also ligament with a nerve hook and removed in  circumferential fashion along the posterior osteophytes at C5-6.  We were  careful to undercut the body C5 and the top of C6 to decompress the central  canal.  Bilateral foraminotomies were performed and the C6 nerve roots were  identified bilaterally and followed out past the pedicle level.  We then  measured the interspace to be 7 mm and tapped a 7 mm Peak interbody cage  packed local autograft and DBX putty into position at C5-6.  We then used  the high-speed drill to drill the endplates again at C6-7 down to the level  of posterior longitudinal ligament which was opened with a nerve hook and  removed in a circumferential fashion along the posterior osteophytes at C6-  7.  We undercut the body C6 and the top of C7 to decompress the  central  canal.  We also did bilateral foraminotomies.  We palpated the pedicle,  followed the nerve root out past the pedicle level.  Once adequate  decompression was performed, we palpated with nerve hook to assure adequate  decompression.  I irrigated with saline solution, measured the interspace to  7 mm.  Once again I used a 7 mm PEEK interbody cage packed with local  autograft and DBX putty and tapped this in position at C6-7.  We then  irrigated with saline solution.  Once again and used a 42.5 mm Venture plate  and placed two 13 mm variable angle screws in the bodies of C5, C6, and C7  and checked our construct with fluoroscopy.  We then dried the surgical bed  with bipolar cautery and with Surgifoam and once meticulous hemostasis was  achieved, closed the platysma with 3-0 Vicryl, closed the subcuticular  tissue with 3-0 Vicryl and closed the skin with Benzoin and Steri-Strips.  The drapes removed, a sterile dressing was applied.  The patient awakened  from anesthesia and transferred to recovery room stable condition.  The end  of the procedure all sponge, needle and sponge counts were correct.      Tia Alert, MD  Electronically Signed     DSJ/MEDQ  D:  08/08/2005  T:  08/10/2005  Job:  [id number]

## 2011-02-05 LAB — PROTIME-INR
INR: 1
Prothrombin Time: 12.9

## 2011-02-05 LAB — BASIC METABOLIC PANEL
BUN: 11
CO2: 30
Calcium: 9.2
Chloride: 104
Creatinine, Ser: 0.77
GFR calc Af Amer: >60
GFR calc non Af Amer: >60
Glucose, Bld: 88
Potassium: 3.9
Sodium: 139

## 2011-02-05 LAB — DIFFERENTIAL
Basophils Absolute: 0
Basophils Relative: 1
Eosinophils Absolute: 0.3
Eosinophils Relative: 4
Lymphocytes Relative: 30
Lymphs Abs: 2.2
Monocytes Absolute: 0.8
Monocytes Relative: 11
Neutro Abs: 4.1
Neutrophils Relative %: 55

## 2011-02-05 LAB — CBC
HCT: 42
Hemoglobin: 14.7
MCHC: 35
MCV: 91.5
Platelets: 222
RBC: 4.59
RDW: 13.5
WBC: 7.5

## 2011-02-05 LAB — APTT: aPTT: 27

## 2011-02-08 LAB — PROTIME-INR
INR: 0.9
Prothrombin Time: 12.3

## 2011-02-08 LAB — BASIC METABOLIC PANEL
BUN: 9
CO2: 27
Calcium: 9.2
Chloride: 100
Creatinine, Ser: 0.77
GFR calc Af Amer: >60
GFR calc non Af Amer: >60
Glucose, Bld: 110 — ABNORMAL HIGH
Potassium: 4.4
Sodium: 135

## 2011-02-08 LAB — CBC
HCT: 43.6
Hemoglobin: 15.2
MCHC: 34.9
MCV: 92.3
Platelets: 262
RBC: 4.72
RDW: 13.5
WBC: 8.9

## 2011-02-08 LAB — DIFFERENTIAL
Basophils Absolute: 0
Basophils Relative: 0
Eosinophils Absolute: 0.4
Eosinophils Relative: 4
Lymphocytes Relative: 28
Lymphs Abs: 2.5
Monocytes Absolute: 0.9
Monocytes Relative: 10
Neutro Abs: 5.1
Neutrophils Relative %: 57

## 2011-02-08 LAB — APTT: aPTT: 27

## 2014-08-12 ENCOUNTER — Other Ambulatory Visit: Payer: Self-pay | Admitting: Gastroenterology

## 2014-10-25 ENCOUNTER — Encounter (HOSPITAL_COMMUNITY): Payer: Self-pay | Admitting: *Deleted

## 2014-11-01 ENCOUNTER — Other Ambulatory Visit: Payer: Self-pay | Admitting: Gastroenterology

## 2014-11-01 NOTE — Anesthesia Preprocedure Evaluation (Signed)
Anesthesia Evaluation  Patient identified by MRN, date of birth, ID band Patient awake    Reviewed: Allergy & Precautions, NPO status , Patient's Chart, lab work & pertinent test results  History of Anesthesia Complications Negative for: history of anesthetic complications  Airway Mallampati: II  TM Distance: >3 FB Neck ROM: Full    Dental no notable dental hx. (+) Dental Advisory Given   Pulmonary Current Smoker,  breath sounds clear to auscultation  Pulmonary exam normal       Cardiovascular hypertension, Pt. on medications Normal cardiovascular examRhythm:Regular Rate:Normal     Neuro/Psych negative neurological ROS  negative psych ROS   GI/Hepatic Neg liver ROS, GERD-  Medicated and Controlled,  Endo/Other  negative endocrine ROS  Renal/GU negative Renal ROS  negative genitourinary   Musculoskeletal negative musculoskeletal ROS (+)   Abdominal   Peds negative pediatric ROS (+)  Hematology negative hematology ROS (+)   Anesthesia Other Findings   Reproductive/Obstetrics negative OB ROS                             Anesthesia Physical Anesthesia Plan  ASA: II  Anesthesia Plan: MAC   Post-op Pain Management:    Induction: Intravenous  Airway Management Planned: Nasal Cannula  Additional Equipment:   Intra-op Plan:   Post-operative Plan:   Informed Consent: I have reviewed the patients History and Physical, chart, labs and discussed the procedure including the risks, benefits and alternatives for the proposed anesthesia with the patient or authorized representative who has indicated his/her understanding and acceptance.   Dental advisory given  Plan Discussed with: CRNA  Anesthesia Plan Comments:         Anesthesia Quick Evaluation

## 2014-11-02 ENCOUNTER — Ambulatory Visit (HOSPITAL_COMMUNITY): Payer: Medicare Other | Admitting: Anesthesiology

## 2014-11-02 ENCOUNTER — Encounter (HOSPITAL_COMMUNITY)
Admission: RE | Disposition: A | Discharge status: Home / Self Care | Payer: Self-pay | Source: Ambulatory Visit | Attending: Gastroenterology

## 2014-11-02 ENCOUNTER — Ambulatory Visit (HOSPITAL_COMMUNITY)
Admission: RE | Admit: 2014-11-02 | Discharge: 2014-11-02 | Disposition: A | Discharge status: Home / Self Care | Payer: Medicare Other | Source: Ambulatory Visit | Attending: Gastroenterology | Admitting: Gastroenterology

## 2014-11-02 ENCOUNTER — Encounter (HOSPITAL_COMMUNITY): Payer: Self-pay

## 2014-11-02 DIAGNOSIS — D124 Benign neoplasm of descending colon: Secondary | ICD-10-CM | POA: Insufficient documentation

## 2014-11-02 DIAGNOSIS — K219 Gastro-esophageal reflux disease without esophagitis: Secondary | ICD-10-CM | POA: Diagnosis not present

## 2014-11-02 DIAGNOSIS — F17200 Nicotine dependence, unspecified, uncomplicated: Secondary | ICD-10-CM | POA: Insufficient documentation

## 2014-11-02 DIAGNOSIS — Z800 Family history of malignant neoplasm of digestive organs: Secondary | ICD-10-CM | POA: Diagnosis not present

## 2014-11-02 DIAGNOSIS — D125 Benign neoplasm of sigmoid colon: Secondary | ICD-10-CM | POA: Insufficient documentation

## 2014-11-02 DIAGNOSIS — D122 Benign neoplasm of ascending colon: Secondary | ICD-10-CM | POA: Diagnosis not present

## 2014-11-02 DIAGNOSIS — I10 Essential (primary) hypertension: Secondary | ICD-10-CM | POA: Diagnosis not present

## 2014-11-02 DIAGNOSIS — Z79899 Other long term (current) drug therapy: Secondary | ICD-10-CM | POA: Diagnosis not present

## 2014-11-02 DIAGNOSIS — K621 Rectal polyp: Secondary | ICD-10-CM | POA: Insufficient documentation

## 2014-11-02 DIAGNOSIS — Z86010 Personal history of colonic polyps: Secondary | ICD-10-CM | POA: Diagnosis not present

## 2014-11-02 DIAGNOSIS — Z09 Encounter for follow-up examination after completed treatment for conditions other than malignant neoplasm: Secondary | ICD-10-CM | POA: Diagnosis present

## 2014-11-02 HISTORY — DX: Other complications of anesthesia, initial encounter: T88.59XA

## 2014-11-02 HISTORY — DX: Adverse effect of unspecified anesthetic, initial encounter: T41.45XA

## 2014-11-02 HISTORY — DX: Gastro-esophageal reflux disease without esophagitis: K21.9

## 2014-11-02 HISTORY — PX: COLONOSCOPY WITH PROPOFOL: SHX5780

## 2014-11-02 HISTORY — DX: Essential (primary) hypertension: I10

## 2014-11-02 SURGERY — COLONOSCOPY WITH PROPOFOL
Anesthesia: Monitor Anesthesia Care

## 2014-11-02 MED ORDER — LACTATED RINGERS IV SOLN
INTRAVENOUS | Status: DC
  Administered 2014-11-02: 1000 mL via INTRAVENOUS

## 2014-11-02 MED ORDER — PROPOFOL 10 MG/ML IV BOLUS
INTRAVENOUS | Status: DC | PRN
Start: 2014-11-02 — End: 2014-11-02
  Administered 2014-11-02: 50 mg via INTRAVENOUS
  Administered 2014-11-02: 30 mg via INTRAVENOUS
  Administered 2014-11-02: 20 mg via INTRAVENOUS
  Administered 2014-11-02: 30 mg via INTRAVENOUS
  Administered 2014-11-02: 20 mg via INTRAVENOUS

## 2014-11-02 MED ORDER — PROPOFOL 10 MG/ML IV BOLUS
INTRAVENOUS | Status: AC
  Filled 2014-11-02: qty 20

## 2014-11-02 MED ORDER — PROPOFOL INFUSION 10 MG/ML OPTIME
INTRAVENOUS | Status: DC | PRN
  Administered 2014-11-02: 120 ug/kg/min via INTRAVENOUS

## 2014-11-02 SURGICAL SUPPLY — 21 items

## 2014-11-02 NOTE — Op Note (Signed)
Procedure: Surveillance colonoscopy. Father diagnosed with colon cancer in his 11s. 02/18/2008 colonoscopy performed with removal of two diminutive tubular adenomatous colon polyps  Endoscopist: Earle Gell  Premedication: Propofol administered by anesthesia  Procedure: The patient was placed in the left lateral decubitus position. Anal inspection and digital rectal exam were normal. The Pentax pediatric colonoscope was introduced into the rectum and advanced to the cecum. A normal-appearing appendiceal orifice and ileocecal valve were identified. Colonic preparation for the exam today was good. Withdrawal time was 18 minutes  Rectum. From the mid rectum, a 5 mm sessile polyp was removed with the cold snare. Retroflexed view of the distal rectum was normal.  Sigmoid colon. From the distal sigmoid colon, a 3 mm sessile polyp was removed with the cold biopsy forceps  Descending colon. From the mid descending colon, a 3 mm sessile polyp was removed with the cold biopsy forceps  Splenic flexure. Normal  Transverse colon. Normal  Hepatic flexure. Normal  Ascending colon. From the mid ascending colon, a 3 mm sessile polyp removed with the cold biopsy forceps  Cecum and ileocecal valve. Normal  Assessment: A small polyp was removed from the rectum, a small polyp was removed from the sigmoid colon, a small polyp was removed from the descending colon, and a small polyp was removed from the ascending colon.  Recommendation: Schedule surveillance colonoscopy in 5 years

## 2014-11-02 NOTE — Anesthesia Postprocedure Evaluation (Signed)
  Anesthesia Post-op Note  Patient: [name]  Procedure(s) Performed: Procedure(s) (LRB): COLONOSCOPY WITH PROPOFOL (N/A)  Patient Location: PACU  Anesthesia Type: MAC  Level of Consciousness: awake and alert   Airway and Oxygen Therapy: Patient Spontanous Breathing  Post-op Pain: mild  Post-op Assessment: Post-op Vital signs reviewed, Patient's Cardiovascular Status Stable, Respiratory Function Stable, Patent Airway and No signs of Nausea or vomiting  Last Vitals:  Filed Vitals:   11/02/14 1409  BP: 160/98  Pulse: 56  Temp:   Resp: 18    Post-op Vital Signs: stable   Complications: No apparent anesthesia complications

## 2014-11-02 NOTE — Transfer of Care (Signed)
Immediate Anesthesia Transfer of Care Note  Patient: [name]  Procedure(s) Performed: Procedure(s): COLONOSCOPY WITH PROPOFOL (N/A)  Patient Location: PACU  Anesthesia Type:MAC  Level of Consciousness:  sedated, patient cooperative and responds to stimulation  Airway & Oxygen Therapy:Patient Spontanous Breathing and Patient connected to face mask oxgen  Post-op Assessment:  Report given to PACU RN and Post -op Vital signs reviewed and stable  Post vital signs:  Reviewed and stable  Last Vitals:  Filed Vitals:   11/02/14 1203  BP: 170/100  Temp: 36.8 C  Resp: 22    Complications: No apparent anesthesia complications

## 2014-11-02 NOTE — Discharge Instructions (Signed)

## 2014-11-02 NOTE — H&P (Signed)
  Procedure: Surveillance colonoscopy. Father diagnosed with colon cancer in his 46s. 02/18/2008 colonoscopy performed with removal of two diminutive tubular adenomatous colon polyps.  History: The patient is a 59 year old male born 09-11-1955. He is scheduled to undergo a surveillance colonoscopy today.  Medication allergies: Darvocet. Lisinopril.  Past medical history: Lungs are surgery. Left elbow surgery. Right elbow surgery on 2 occasions. IVC filter placed in August 2008. Hypertension. Hypercholesterolemia. Overactive bladder.  Exam: The patient is alert and lying comfortably on the endoscopy stretcher. Abdomen is soft and nontender to palpation. Lungs are clear to auscultation. Cardiac exam reveals a regular rhythm.  Plan: Proceed with surveillance colonoscopy

## 2014-11-03 ENCOUNTER — Encounter (HOSPITAL_COMMUNITY): Payer: Self-pay | Admitting: Gastroenterology

## 2015-05-15 DIAGNOSIS — K759 Inflammatory liver disease, unspecified: Secondary | ICD-10-CM | POA: Insufficient documentation

## 2015-05-15 HISTORY — DX: Inflammatory liver disease, unspecified: K75.9

## 2015-10-21 DIAGNOSIS — F17210 Nicotine dependence, cigarettes, uncomplicated: Secondary | ICD-10-CM | POA: Diagnosis not present

## 2015-10-21 DIAGNOSIS — Z1389 Encounter for screening for other disorder: Secondary | ICD-10-CM | POA: Diagnosis not present

## 2015-10-21 DIAGNOSIS — I129 Hypertensive chronic kidney disease with stage 1 through stage 4 chronic kidney disease, or unspecified chronic kidney disease: Secondary | ICD-10-CM | POA: Diagnosis not present

## 2015-10-21 DIAGNOSIS — E7800 Pure hypercholesterolemia, unspecified: Secondary | ICD-10-CM | POA: Diagnosis not present

## 2015-10-21 DIAGNOSIS — N183 Chronic kidney disease, stage 3 (moderate): Secondary | ICD-10-CM | POA: Diagnosis not present

## 2015-10-21 DIAGNOSIS — Z1159 Encounter for screening for other viral diseases: Secondary | ICD-10-CM | POA: Diagnosis not present

## 2015-10-21 DIAGNOSIS — R7301 Impaired fasting glucose: Secondary | ICD-10-CM | POA: Diagnosis not present

## 2015-10-21 DIAGNOSIS — G8929 Other chronic pain: Secondary | ICD-10-CM | POA: Diagnosis not present

## 2015-10-25 DIAGNOSIS — R894 Abnormal immunological findings in specimens from other organs, systems and tissues: Secondary | ICD-10-CM | POA: Diagnosis not present

## 2015-11-29 DIAGNOSIS — B182 Chronic viral hepatitis C: Secondary | ICD-10-CM | POA: Diagnosis not present

## 2015-12-01 ENCOUNTER — Other Ambulatory Visit (HOSPITAL_COMMUNITY): Payer: Self-pay | Admitting: Nurse Practitioner

## 2015-12-01 DIAGNOSIS — B182 Chronic viral hepatitis C: Secondary | ICD-10-CM

## 2015-12-06 DIAGNOSIS — K625 Hemorrhage of anus and rectum: Secondary | ICD-10-CM | POA: Diagnosis not present

## 2015-12-06 DIAGNOSIS — K648 Other hemorrhoids: Secondary | ICD-10-CM | POA: Diagnosis not present

## 2015-12-28 ENCOUNTER — Ambulatory Visit (HOSPITAL_COMMUNITY)
Admission: RE | Admit: 2015-12-28 | Discharge: 2015-12-28 | Disposition: A | Discharge status: Home / Self Care | Payer: Commercial Managed Care - HMO | Source: Ambulatory Visit | Attending: Nurse Practitioner | Admitting: Nurse Practitioner

## 2015-12-28 ENCOUNTER — Other Ambulatory Visit (HOSPITAL_COMMUNITY): Payer: Self-pay | Admitting: Nurse Practitioner

## 2015-12-28 DIAGNOSIS — K7689 Other specified diseases of liver: Secondary | ICD-10-CM | POA: Diagnosis not present

## 2015-12-28 DIAGNOSIS — R932 Abnormal findings on diagnostic imaging of liver and biliary tract: Secondary | ICD-10-CM | POA: Insufficient documentation

## 2015-12-28 DIAGNOSIS — B182 Chronic viral hepatitis C: Secondary | ICD-10-CM

## 2015-12-28 DIAGNOSIS — N200 Calculus of kidney: Secondary | ICD-10-CM | POA: Diagnosis not present

## 2015-12-28 DIAGNOSIS — N281 Cyst of kidney, acquired: Secondary | ICD-10-CM | POA: Diagnosis not present

## 2016-01-04 ENCOUNTER — Ambulatory Visit (HOSPITAL_COMMUNITY)
Admission: RE | Admit: 2016-01-04 | Discharge: 2016-01-04 | Disposition: A | Discharge status: Home / Self Care | Payer: Commercial Managed Care - HMO | Source: Ambulatory Visit | Attending: Nurse Practitioner | Admitting: Nurse Practitioner

## 2016-01-04 DIAGNOSIS — B182 Chronic viral hepatitis C: Secondary | ICD-10-CM

## 2016-01-04 DIAGNOSIS — R932 Abnormal findings on diagnostic imaging of liver and biliary tract: Secondary | ICD-10-CM | POA: Diagnosis not present

## 2016-01-04 DIAGNOSIS — N281 Cyst of kidney, acquired: Secondary | ICD-10-CM | POA: Diagnosis not present

## 2016-01-04 DIAGNOSIS — N200 Calculus of kidney: Secondary | ICD-10-CM | POA: Diagnosis not present

## 2016-01-04 DIAGNOSIS — B1920 Unspecified viral hepatitis C without hepatic coma: Secondary | ICD-10-CM | POA: Diagnosis not present

## 2016-02-06 DIAGNOSIS — K740 Hepatic fibrosis: Secondary | ICD-10-CM | POA: Diagnosis not present

## 2016-02-06 DIAGNOSIS — B182 Chronic viral hepatitis C: Secondary | ICD-10-CM | POA: Diagnosis not present

## 2016-03-13 DIAGNOSIS — B182 Chronic viral hepatitis C: Secondary | ICD-10-CM | POA: Diagnosis not present

## 2016-03-13 DIAGNOSIS — Z23 Encounter for immunization: Secondary | ICD-10-CM | POA: Diagnosis not present

## 2016-03-26 DIAGNOSIS — K740 Hepatic fibrosis: Secondary | ICD-10-CM | POA: Diagnosis not present

## 2016-03-26 DIAGNOSIS — B182 Chronic viral hepatitis C: Secondary | ICD-10-CM | POA: Diagnosis not present

## 2016-04-10 DIAGNOSIS — B182 Chronic viral hepatitis C: Secondary | ICD-10-CM | POA: Diagnosis not present

## 2016-04-23 DIAGNOSIS — K219 Gastro-esophageal reflux disease without esophagitis: Secondary | ICD-10-CM | POA: Diagnosis not present

## 2016-04-23 DIAGNOSIS — H9193 Unspecified hearing loss, bilateral: Secondary | ICD-10-CM | POA: Diagnosis not present

## 2016-04-23 DIAGNOSIS — Z125 Encounter for screening for malignant neoplasm of prostate: Secondary | ICD-10-CM | POA: Diagnosis not present

## 2016-04-23 DIAGNOSIS — Z0000 Encounter for general adult medical examination without abnormal findings: Secondary | ICD-10-CM | POA: Diagnosis not present

## 2016-04-23 DIAGNOSIS — Z23 Encounter for immunization: Secondary | ICD-10-CM | POA: Diagnosis not present

## 2016-04-23 DIAGNOSIS — B1920 Unspecified viral hepatitis C without hepatic coma: Secondary | ICD-10-CM | POA: Diagnosis not present

## 2016-04-23 DIAGNOSIS — E7800 Pure hypercholesterolemia, unspecified: Secondary | ICD-10-CM | POA: Diagnosis not present

## 2016-04-23 DIAGNOSIS — B356 Tinea cruris: Secondary | ICD-10-CM | POA: Diagnosis not present

## 2016-04-23 DIAGNOSIS — N183 Chronic kidney disease, stage 3 (moderate): Secondary | ICD-10-CM | POA: Diagnosis not present

## 2016-04-23 DIAGNOSIS — I129 Hypertensive chronic kidney disease with stage 1 through stage 4 chronic kidney disease, or unspecified chronic kidney disease: Secondary | ICD-10-CM | POA: Diagnosis not present

## 2016-04-23 DIAGNOSIS — Z113 Encounter for screening for infections with a predominantly sexual mode of transmission: Secondary | ICD-10-CM | POA: Diagnosis not present

## 2016-05-11 DIAGNOSIS — F17210 Nicotine dependence, cigarettes, uncomplicated: Secondary | ICD-10-CM | POA: Diagnosis not present

## 2016-05-11 DIAGNOSIS — J209 Acute bronchitis, unspecified: Secondary | ICD-10-CM | POA: Diagnosis not present

## 2016-06-04 DIAGNOSIS — E781 Pure hyperglyceridemia: Secondary | ICD-10-CM | POA: Diagnosis not present

## 2016-06-04 DIAGNOSIS — E7800 Pure hypercholesterolemia, unspecified: Secondary | ICD-10-CM | POA: Diagnosis not present

## 2016-06-04 DIAGNOSIS — F320 Major depressive disorder, single episode, mild: Secondary | ICD-10-CM | POA: Diagnosis not present

## 2016-06-04 DIAGNOSIS — B009 Herpesviral infection, unspecified: Secondary | ICD-10-CM | POA: Diagnosis not present

## 2016-06-10 DIAGNOSIS — H9190 Unspecified hearing loss, unspecified ear: Secondary | ICD-10-CM | POA: Diagnosis not present

## 2016-07-03 DIAGNOSIS — B182 Chronic viral hepatitis C: Secondary | ICD-10-CM | POA: Diagnosis not present

## 2016-07-09 DIAGNOSIS — B182 Chronic viral hepatitis C: Secondary | ICD-10-CM | POA: Diagnosis not present

## 2016-07-09 DIAGNOSIS — K740 Hepatic fibrosis: Secondary | ICD-10-CM | POA: Diagnosis not present

## 2016-07-11 ENCOUNTER — Other Ambulatory Visit: Payer: Self-pay | Admitting: Nurse Practitioner

## 2016-07-11 DIAGNOSIS — K7400 Hepatic fibrosis, unspecified: Secondary | ICD-10-CM

## 2016-07-24 ENCOUNTER — Other Ambulatory Visit: Payer: Medicare Other

## 2016-07-27 ENCOUNTER — Ambulatory Visit
Admission: RE | Admit: 2016-07-27 | Discharge: 2016-07-27 | Disposition: A | Discharge status: Home / Self Care | Payer: Medicare HMO | Source: Ambulatory Visit | Attending: Nurse Practitioner | Admitting: Nurse Practitioner

## 2016-07-27 DIAGNOSIS — K7400 Hepatic fibrosis, unspecified: Secondary | ICD-10-CM

## 2016-09-10 DIAGNOSIS — Z23 Encounter for immunization: Secondary | ICD-10-CM | POA: Diagnosis not present

## 2016-11-02 DIAGNOSIS — N200 Calculus of kidney: Secondary | ICD-10-CM | POA: Diagnosis not present

## 2016-11-02 DIAGNOSIS — I10 Essential (primary) hypertension: Secondary | ICD-10-CM | POA: Diagnosis not present

## 2016-11-02 DIAGNOSIS — Z1389 Encounter for screening for other disorder: Secondary | ICD-10-CM | POA: Diagnosis not present

## 2016-11-02 DIAGNOSIS — F320 Major depressive disorder, single episode, mild: Secondary | ICD-10-CM | POA: Diagnosis not present

## 2016-11-02 DIAGNOSIS — E7800 Pure hypercholesterolemia, unspecified: Secondary | ICD-10-CM | POA: Diagnosis not present

## 2016-11-02 DIAGNOSIS — R7303 Prediabetes: Secondary | ICD-10-CM | POA: Diagnosis not present

## 2016-11-02 DIAGNOSIS — F17210 Nicotine dependence, cigarettes, uncomplicated: Secondary | ICD-10-CM | POA: Diagnosis not present

## 2016-11-02 DIAGNOSIS — B1920 Unspecified viral hepatitis C without hepatic coma: Secondary | ICD-10-CM | POA: Diagnosis not present

## 2016-11-02 DIAGNOSIS — B356 Tinea cruris: Secondary | ICD-10-CM | POA: Diagnosis not present

## 2016-12-31 DIAGNOSIS — K740 Hepatic fibrosis: Secondary | ICD-10-CM | POA: Diagnosis not present

## 2016-12-31 DIAGNOSIS — K7469 Other cirrhosis of liver: Secondary | ICD-10-CM | POA: Diagnosis not present

## 2017-03-13 DIAGNOSIS — R21 Rash and other nonspecific skin eruption: Secondary | ICD-10-CM | POA: Diagnosis not present

## 2017-04-11 DIAGNOSIS — L308 Other specified dermatitis: Secondary | ICD-10-CM | POA: Diagnosis not present

## 2017-04-29 DIAGNOSIS — Z1389 Encounter for screening for other disorder: Secondary | ICD-10-CM | POA: Diagnosis not present

## 2017-04-29 DIAGNOSIS — Z23 Encounter for immunization: Secondary | ICD-10-CM | POA: Diagnosis not present

## 2017-04-29 DIAGNOSIS — I10 Essential (primary) hypertension: Secondary | ICD-10-CM | POA: Diagnosis not present

## 2017-04-29 DIAGNOSIS — K219 Gastro-esophageal reflux disease without esophagitis: Secondary | ICD-10-CM | POA: Diagnosis not present

## 2017-04-29 DIAGNOSIS — E7800 Pure hypercholesterolemia, unspecified: Secondary | ICD-10-CM | POA: Diagnosis not present

## 2017-04-29 DIAGNOSIS — Z0000 Encounter for general adult medical examination without abnormal findings: Secondary | ICD-10-CM | POA: Diagnosis not present

## 2017-04-29 DIAGNOSIS — Z125 Encounter for screening for malignant neoplasm of prostate: Secondary | ICD-10-CM | POA: Diagnosis not present

## 2017-11-06 DIAGNOSIS — M545 Low back pain: Secondary | ICD-10-CM | POA: Diagnosis not present

## 2017-11-06 DIAGNOSIS — F17210 Nicotine dependence, cigarettes, uncomplicated: Secondary | ICD-10-CM | POA: Diagnosis not present

## 2017-11-06 DIAGNOSIS — Z23 Encounter for immunization: Secondary | ICD-10-CM | POA: Diagnosis not present

## 2017-11-06 DIAGNOSIS — I10 Essential (primary) hypertension: Secondary | ICD-10-CM | POA: Diagnosis not present

## 2017-11-06 DIAGNOSIS — B356 Tinea cruris: Secondary | ICD-10-CM | POA: Diagnosis not present

## 2017-11-06 DIAGNOSIS — E7800 Pure hypercholesterolemia, unspecified: Secondary | ICD-10-CM | POA: Diagnosis not present

## 2017-11-06 DIAGNOSIS — R7303 Prediabetes: Secondary | ICD-10-CM | POA: Diagnosis not present

## 2018-04-30 DIAGNOSIS — Z1389 Encounter for screening for other disorder: Secondary | ICD-10-CM | POA: Diagnosis not present

## 2018-04-30 DIAGNOSIS — K219 Gastro-esophageal reflux disease without esophagitis: Secondary | ICD-10-CM | POA: Diagnosis not present

## 2018-04-30 DIAGNOSIS — E7800 Pure hypercholesterolemia, unspecified: Secondary | ICD-10-CM | POA: Diagnosis not present

## 2018-04-30 DIAGNOSIS — F17210 Nicotine dependence, cigarettes, uncomplicated: Secondary | ICD-10-CM | POA: Diagnosis not present

## 2018-04-30 DIAGNOSIS — Z0000 Encounter for general adult medical examination without abnormal findings: Secondary | ICD-10-CM | POA: Diagnosis not present

## 2018-04-30 DIAGNOSIS — Z23 Encounter for immunization: Secondary | ICD-10-CM | POA: Diagnosis not present

## 2018-04-30 DIAGNOSIS — Z125 Encounter for screening for malignant neoplasm of prostate: Secondary | ICD-10-CM | POA: Diagnosis not present

## 2018-04-30 DIAGNOSIS — I10 Essential (primary) hypertension: Secondary | ICD-10-CM | POA: Diagnosis not present

## 2018-10-31 ENCOUNTER — Other Ambulatory Visit: Payer: Self-pay | Admitting: Family Medicine

## 2018-10-31 ENCOUNTER — Other Ambulatory Visit: Payer: Self-pay

## 2018-10-31 ENCOUNTER — Ambulatory Visit
Admission: RE | Admit: 2018-10-31 | Discharge: 2018-10-31 | Disposition: A | Discharge status: Home / Self Care | Payer: Medicare HMO | Source: Ambulatory Visit | Attending: Family Medicine | Admitting: Family Medicine

## 2018-10-31 DIAGNOSIS — M7989 Other specified soft tissue disorders: Secondary | ICD-10-CM | POA: Diagnosis not present

## 2018-10-31 DIAGNOSIS — T1490XA Injury, unspecified, initial encounter: Secondary | ICD-10-CM

## 2018-10-31 DIAGNOSIS — M79642 Pain in left hand: Secondary | ICD-10-CM | POA: Diagnosis not present

## 2018-10-31 DIAGNOSIS — I10 Essential (primary) hypertension: Secondary | ICD-10-CM | POA: Diagnosis not present

## 2018-10-31 DIAGNOSIS — M79643 Pain in unspecified hand: Secondary | ICD-10-CM

## 2018-10-31 DIAGNOSIS — R7303 Prediabetes: Secondary | ICD-10-CM | POA: Diagnosis not present

## 2018-10-31 DIAGNOSIS — F17210 Nicotine dependence, cigarettes, uncomplicated: Secondary | ICD-10-CM | POA: Diagnosis not present

## 2018-10-31 DIAGNOSIS — L03114 Cellulitis of left upper limb: Secondary | ICD-10-CM | POA: Diagnosis not present

## 2018-10-31 DIAGNOSIS — E7800 Pure hypercholesterolemia, unspecified: Secondary | ICD-10-CM | POA: Diagnosis not present

## 2018-10-31 DIAGNOSIS — S60222A Contusion of left hand, initial encounter: Secondary | ICD-10-CM | POA: Diagnosis not present

## 2018-10-31 DIAGNOSIS — L309 Dermatitis, unspecified: Secondary | ICD-10-CM | POA: Diagnosis not present

## 2019-03-26 DIAGNOSIS — F320 Major depressive disorder, single episode, mild: Secondary | ICD-10-CM | POA: Diagnosis not present

## 2019-03-26 DIAGNOSIS — E7800 Pure hypercholesterolemia, unspecified: Secondary | ICD-10-CM | POA: Diagnosis not present

## 2019-03-26 DIAGNOSIS — E1169 Type 2 diabetes mellitus with other specified complication: Secondary | ICD-10-CM | POA: Diagnosis not present

## 2019-03-26 DIAGNOSIS — I10 Essential (primary) hypertension: Secondary | ICD-10-CM | POA: Diagnosis not present

## 2019-05-22 DIAGNOSIS — M25532 Pain in left wrist: Secondary | ICD-10-CM | POA: Diagnosis not present

## 2019-05-22 DIAGNOSIS — I10 Essential (primary) hypertension: Secondary | ICD-10-CM | POA: Diagnosis not present

## 2019-05-22 DIAGNOSIS — E1169 Type 2 diabetes mellitus with other specified complication: Secondary | ICD-10-CM | POA: Diagnosis not present

## 2019-05-22 DIAGNOSIS — Z23 Encounter for immunization: Secondary | ICD-10-CM | POA: Diagnosis not present

## 2019-05-22 DIAGNOSIS — K219 Gastro-esophageal reflux disease without esophagitis: Secondary | ICD-10-CM | POA: Diagnosis not present

## 2019-05-22 DIAGNOSIS — Z0000 Encounter for general adult medical examination without abnormal findings: Secondary | ICD-10-CM | POA: Diagnosis not present

## 2019-05-22 DIAGNOSIS — Z125 Encounter for screening for malignant neoplasm of prostate: Secondary | ICD-10-CM | POA: Diagnosis not present

## 2019-05-22 DIAGNOSIS — E7800 Pure hypercholesterolemia, unspecified: Secondary | ICD-10-CM | POA: Diagnosis not present

## 2019-05-22 DIAGNOSIS — Z1389 Encounter for screening for other disorder: Secondary | ICD-10-CM | POA: Diagnosis not present

## 2019-07-10 DIAGNOSIS — E7800 Pure hypercholesterolemia, unspecified: Secondary | ICD-10-CM | POA: Diagnosis not present

## 2019-07-10 DIAGNOSIS — I10 Essential (primary) hypertension: Secondary | ICD-10-CM | POA: Diagnosis not present

## 2019-07-10 DIAGNOSIS — E1169 Type 2 diabetes mellitus with other specified complication: Secondary | ICD-10-CM | POA: Diagnosis not present

## 2019-07-10 DIAGNOSIS — F320 Major depressive disorder, single episode, mild: Secondary | ICD-10-CM | POA: Diagnosis not present

## 2019-10-08 DIAGNOSIS — E7800 Pure hypercholesterolemia, unspecified: Secondary | ICD-10-CM | POA: Diagnosis not present

## 2019-10-08 DIAGNOSIS — I10 Essential (primary) hypertension: Secondary | ICD-10-CM | POA: Diagnosis not present

## 2019-10-08 DIAGNOSIS — F320 Major depressive disorder, single episode, mild: Secondary | ICD-10-CM | POA: Diagnosis not present

## 2019-10-08 DIAGNOSIS — E1169 Type 2 diabetes mellitus with other specified complication: Secondary | ICD-10-CM | POA: Diagnosis not present

## 2019-10-08 DIAGNOSIS — G4700 Insomnia, unspecified: Secondary | ICD-10-CM | POA: Diagnosis not present

## 2019-11-20 DIAGNOSIS — F17210 Nicotine dependence, cigarettes, uncomplicated: Secondary | ICD-10-CM | POA: Diagnosis not present

## 2019-11-20 DIAGNOSIS — E1169 Type 2 diabetes mellitus with other specified complication: Secondary | ICD-10-CM | POA: Diagnosis not present

## 2019-11-20 DIAGNOSIS — Z7984 Long term (current) use of oral hypoglycemic drugs: Secondary | ICD-10-CM | POA: Diagnosis not present

## 2019-11-20 DIAGNOSIS — I10 Essential (primary) hypertension: Secondary | ICD-10-CM | POA: Diagnosis not present

## 2019-11-20 DIAGNOSIS — G8929 Other chronic pain: Secondary | ICD-10-CM | POA: Diagnosis not present

## 2019-11-20 DIAGNOSIS — L570 Actinic keratosis: Secondary | ICD-10-CM | POA: Diagnosis not present

## 2019-11-20 DIAGNOSIS — R062 Wheezing: Secondary | ICD-10-CM | POA: Diagnosis not present

## 2019-11-20 DIAGNOSIS — E7800 Pure hypercholesterolemia, unspecified: Secondary | ICD-10-CM | POA: Diagnosis not present

## 2019-11-30 DIAGNOSIS — E559 Vitamin D deficiency, unspecified: Secondary | ICD-10-CM | POA: Diagnosis not present

## 2019-11-30 DIAGNOSIS — M542 Cervicalgia: Secondary | ICD-10-CM | POA: Diagnosis not present

## 2019-11-30 DIAGNOSIS — M129 Arthropathy, unspecified: Secondary | ICD-10-CM | POA: Diagnosis not present

## 2019-11-30 DIAGNOSIS — M546 Pain in thoracic spine: Secondary | ICD-10-CM | POA: Diagnosis not present

## 2019-11-30 DIAGNOSIS — Z79899 Other long term (current) drug therapy: Secondary | ICD-10-CM | POA: Diagnosis not present

## 2019-11-30 DIAGNOSIS — M545 Low back pain: Secondary | ICD-10-CM | POA: Diagnosis not present

## 2019-11-30 DIAGNOSIS — G8929 Other chronic pain: Secondary | ICD-10-CM | POA: Diagnosis not present

## 2019-11-30 DIAGNOSIS — M25572 Pain in left ankle and joints of left foot: Secondary | ICD-10-CM | POA: Diagnosis not present

## 2019-12-22 DIAGNOSIS — G8929 Other chronic pain: Secondary | ICD-10-CM | POA: Diagnosis not present

## 2019-12-22 DIAGNOSIS — M542 Cervicalgia: Secondary | ICD-10-CM | POA: Diagnosis not present

## 2019-12-22 DIAGNOSIS — Z79899 Other long term (current) drug therapy: Secondary | ICD-10-CM | POA: Diagnosis not present

## 2019-12-22 DIAGNOSIS — M25572 Pain in left ankle and joints of left foot: Secondary | ICD-10-CM | POA: Diagnosis not present

## 2019-12-22 DIAGNOSIS — M19272 Secondary osteoarthritis, left ankle and foot: Secondary | ICD-10-CM | POA: Diagnosis not present

## 2020-01-21 DIAGNOSIS — M542 Cervicalgia: Secondary | ICD-10-CM | POA: Diagnosis not present

## 2020-01-21 DIAGNOSIS — M19272 Secondary osteoarthritis, left ankle and foot: Secondary | ICD-10-CM | POA: Diagnosis not present

## 2020-01-21 DIAGNOSIS — M25572 Pain in left ankle and joints of left foot: Secondary | ICD-10-CM | POA: Diagnosis not present

## 2020-01-21 DIAGNOSIS — G8929 Other chronic pain: Secondary | ICD-10-CM | POA: Diagnosis not present

## 2020-01-21 DIAGNOSIS — Z79899 Other long term (current) drug therapy: Secondary | ICD-10-CM | POA: Diagnosis not present

## 2020-02-26 DIAGNOSIS — Z23 Encounter for immunization: Secondary | ICD-10-CM | POA: Diagnosis not present

## 2020-02-26 DIAGNOSIS — M19272 Secondary osteoarthritis, left ankle and foot: Secondary | ICD-10-CM | POA: Diagnosis not present

## 2020-02-26 DIAGNOSIS — E1165 Type 2 diabetes mellitus with hyperglycemia: Secondary | ICD-10-CM | POA: Diagnosis not present

## 2020-02-26 DIAGNOSIS — M25572 Pain in left ankle and joints of left foot: Secondary | ICD-10-CM | POA: Diagnosis not present

## 2020-02-26 DIAGNOSIS — F17210 Nicotine dependence, cigarettes, uncomplicated: Secondary | ICD-10-CM | POA: Diagnosis not present

## 2020-02-26 DIAGNOSIS — M25512 Pain in left shoulder: Secondary | ICD-10-CM | POA: Diagnosis not present

## 2020-02-26 DIAGNOSIS — Z79899 Other long term (current) drug therapy: Secondary | ICD-10-CM | POA: Diagnosis not present

## 2020-02-29 DIAGNOSIS — H35363 Drusen (degenerative) of macula, bilateral: Secondary | ICD-10-CM | POA: Diagnosis not present

## 2020-02-29 DIAGNOSIS — H25013 Cortical age-related cataract, bilateral: Secondary | ICD-10-CM | POA: Diagnosis not present

## 2020-02-29 DIAGNOSIS — H2513 Age-related nuclear cataract, bilateral: Secondary | ICD-10-CM | POA: Diagnosis not present

## 2020-02-29 DIAGNOSIS — H524 Presbyopia: Secondary | ICD-10-CM | POA: Diagnosis not present

## 2020-02-29 DIAGNOSIS — E119 Type 2 diabetes mellitus without complications: Secondary | ICD-10-CM | POA: Diagnosis not present

## 2020-03-16 DIAGNOSIS — E1169 Type 2 diabetes mellitus with other specified complication: Secondary | ICD-10-CM | POA: Diagnosis not present

## 2020-03-16 DIAGNOSIS — N1831 Chronic kidney disease, stage 3a: Secondary | ICD-10-CM | POA: Diagnosis not present

## 2020-03-16 DIAGNOSIS — I10 Essential (primary) hypertension: Secondary | ICD-10-CM | POA: Diagnosis not present

## 2020-03-16 DIAGNOSIS — E1122 Type 2 diabetes mellitus with diabetic chronic kidney disease: Secondary | ICD-10-CM | POA: Diagnosis not present

## 2020-03-16 DIAGNOSIS — K219 Gastro-esophageal reflux disease without esophagitis: Secondary | ICD-10-CM | POA: Diagnosis not present

## 2020-03-16 DIAGNOSIS — G8929 Other chronic pain: Secondary | ICD-10-CM | POA: Diagnosis not present

## 2020-03-16 DIAGNOSIS — E7800 Pure hypercholesterolemia, unspecified: Secondary | ICD-10-CM | POA: Diagnosis not present

## 2020-03-16 DIAGNOSIS — F320 Major depressive disorder, single episode, mild: Secondary | ICD-10-CM | POA: Diagnosis not present

## 2020-03-16 DIAGNOSIS — G4700 Insomnia, unspecified: Secondary | ICD-10-CM | POA: Diagnosis not present

## 2020-03-25 DIAGNOSIS — M19012 Primary osteoarthritis, left shoulder: Secondary | ICD-10-CM | POA: Diagnosis not present

## 2020-03-25 DIAGNOSIS — M25572 Pain in left ankle and joints of left foot: Secondary | ICD-10-CM | POA: Diagnosis not present

## 2020-03-25 DIAGNOSIS — Z79899 Other long term (current) drug therapy: Secondary | ICD-10-CM | POA: Diagnosis not present

## 2020-03-25 DIAGNOSIS — G8929 Other chronic pain: Secondary | ICD-10-CM | POA: Diagnosis not present

## 2020-03-25 DIAGNOSIS — F17210 Nicotine dependence, cigarettes, uncomplicated: Secondary | ICD-10-CM | POA: Diagnosis not present

## 2020-03-25 DIAGNOSIS — M19272 Secondary osteoarthritis, left ankle and foot: Secondary | ICD-10-CM | POA: Diagnosis not present

## 2020-03-25 DIAGNOSIS — E559 Vitamin D deficiency, unspecified: Secondary | ICD-10-CM | POA: Diagnosis not present

## 2020-04-22 DIAGNOSIS — G8929 Other chronic pain: Secondary | ICD-10-CM | POA: Diagnosis not present

## 2020-04-22 DIAGNOSIS — M19012 Primary osteoarthritis, left shoulder: Secondary | ICD-10-CM | POA: Diagnosis not present

## 2020-04-22 DIAGNOSIS — F17210 Nicotine dependence, cigarettes, uncomplicated: Secondary | ICD-10-CM | POA: Diagnosis not present

## 2020-04-22 DIAGNOSIS — M19272 Secondary osteoarthritis, left ankle and foot: Secondary | ICD-10-CM | POA: Diagnosis not present

## 2020-04-22 DIAGNOSIS — Z79899 Other long term (current) drug therapy: Secondary | ICD-10-CM | POA: Diagnosis not present

## 2020-04-22 DIAGNOSIS — M25572 Pain in left ankle and joints of left foot: Secondary | ICD-10-CM | POA: Diagnosis not present

## 2020-05-25 DIAGNOSIS — M25572 Pain in left ankle and joints of left foot: Secondary | ICD-10-CM | POA: Diagnosis not present

## 2020-05-25 DIAGNOSIS — E1169 Type 2 diabetes mellitus with other specified complication: Secondary | ICD-10-CM | POA: Diagnosis not present

## 2020-05-25 DIAGNOSIS — M19272 Secondary osteoarthritis, left ankle and foot: Secondary | ICD-10-CM | POA: Diagnosis not present

## 2020-05-25 DIAGNOSIS — M19012 Primary osteoarthritis, left shoulder: Secondary | ICD-10-CM | POA: Diagnosis not present

## 2020-05-25 DIAGNOSIS — Z79899 Other long term (current) drug therapy: Secondary | ICD-10-CM | POA: Diagnosis not present

## 2020-05-25 DIAGNOSIS — Z0000 Encounter for general adult medical examination without abnormal findings: Secondary | ICD-10-CM | POA: Diagnosis not present

## 2020-05-25 DIAGNOSIS — I10 Essential (primary) hypertension: Secondary | ICD-10-CM | POA: Diagnosis not present

## 2020-05-25 DIAGNOSIS — E7800 Pure hypercholesterolemia, unspecified: Secondary | ICD-10-CM | POA: Diagnosis not present

## 2020-05-25 DIAGNOSIS — G894 Chronic pain syndrome: Secondary | ICD-10-CM | POA: Diagnosis not present

## 2020-06-10 DIAGNOSIS — H903 Sensorineural hearing loss, bilateral: Secondary | ICD-10-CM | POA: Diagnosis not present

## 2020-06-22 ENCOUNTER — Other Ambulatory Visit: Payer: Self-pay | Admitting: *Deleted

## 2020-06-22 DIAGNOSIS — F17210 Nicotine dependence, cigarettes, uncomplicated: Secondary | ICD-10-CM

## 2020-06-22 DIAGNOSIS — Z87891 Personal history of nicotine dependence: Secondary | ICD-10-CM

## 2020-06-23 DIAGNOSIS — G894 Chronic pain syndrome: Secondary | ICD-10-CM | POA: Diagnosis not present

## 2020-06-23 DIAGNOSIS — M19012 Primary osteoarthritis, left shoulder: Secondary | ICD-10-CM | POA: Diagnosis not present

## 2020-06-23 DIAGNOSIS — M25572 Pain in left ankle and joints of left foot: Secondary | ICD-10-CM | POA: Diagnosis not present

## 2020-06-23 DIAGNOSIS — Z79899 Other long term (current) drug therapy: Secondary | ICD-10-CM | POA: Diagnosis not present

## 2020-06-23 DIAGNOSIS — M19272 Secondary osteoarthritis, left ankle and foot: Secondary | ICD-10-CM | POA: Diagnosis not present

## 2020-07-13 ENCOUNTER — Ambulatory Visit (INDEPENDENT_AMBULATORY_CARE_PROVIDER_SITE_OTHER): Payer: Medicare Other | Admitting: Acute Care

## 2020-07-13 ENCOUNTER — Encounter: Payer: Self-pay | Admitting: Acute Care

## 2020-07-13 ENCOUNTER — Other Ambulatory Visit: Payer: Self-pay

## 2020-07-13 ENCOUNTER — Ambulatory Visit
Admission: RE | Admit: 2020-07-13 | Discharge: 2020-07-13 | Disposition: A | Discharge status: Home / Self Care | Payer: Medicare HMO | Source: Ambulatory Visit | Attending: Acute Care | Admitting: Acute Care

## 2020-07-13 VITALS — BP 152/86 | HR 59 | Temp 98.0°F | Ht 71.0 in | Wt 245.0 lb

## 2020-07-13 DIAGNOSIS — Z122 Encounter for screening for malignant neoplasm of respiratory organs: Secondary | ICD-10-CM

## 2020-07-13 DIAGNOSIS — F17210 Nicotine dependence, cigarettes, uncomplicated: Secondary | ICD-10-CM

## 2020-07-13 DIAGNOSIS — J432 Centrilobular emphysema: Secondary | ICD-10-CM | POA: Diagnosis not present

## 2020-07-13 DIAGNOSIS — Z87891 Personal history of nicotine dependence: Secondary | ICD-10-CM

## 2020-07-13 DIAGNOSIS — I2510 Atherosclerotic heart disease of native coronary artery without angina pectoris: Secondary | ICD-10-CM | POA: Diagnosis not present

## 2020-07-13 DIAGNOSIS — I700 Atherosclerosis of aorta: Secondary | ICD-10-CM | POA: Diagnosis not present

## 2020-07-13 NOTE — Patient Instructions (Signed)
Thank you for participating in the [facility]. It was our pleasure to meet you today. We will call you with the results of your scan within the next few days. Your scan will be assigned a Lung RADS category score by the physicians reading the scans.  This Lung RADS score determines follow up scanning.  See below for description of categories, and follow up screening recommendations. We will be in touch to schedule your follow up screening annually or based on recommendations of our providers. We will fax a copy of your scan results to your Primary Care Physician, or the physician who referred you to the program, to ensure they have the results. Please call the office if you have any questions or concerns regarding your scanning experience or results.  Our office number is [phone]. Please speak with Denise Phelps, RN. She is our Lung Cancer Screening RN. If she is unavailable when you call, please have the office staff send her a message. She will return your call at her earliest convenience. Remember, if your scan is normal, we will scan you annually as long as you continue to meet the criteria for the program. (Age 55-77, Current smoker or smoker who has quit within the last 15 years). If you are a smoker, remember, quitting is the single most powerful action that you can take to decrease your risk of lung cancer and other pulmonary, breathing related problems. We know quitting is hard, and we are here to help.  Please let us know if there is anything we can do to help you meet your goal of quitting. If you are a former smoker, congratulations. We are proud of you! Remain smoke free! Remember you can refer friends or family members through the number above.  We will screen them to make sure they meet criteria for the program. Thank you for helping us take better care of you by participating in Lung Screening.  Lung RADS Categories:  Lung RADS 1: no nodules  or definitely non-concerning nodules.  Recommendation is for a repeat annual scan in 12 months.  Lung RADS 2:  nodules that are non-concerning in appearance and behavior with a very low likelihood of becoming an active cancer. Recommendation is for a repeat annual scan in 12 months.  Lung RADS 3: nodules that are probably non-concerning , includes nodules with a low likelihood of becoming an active cancer.  Recommendation is for a 6-month repeat screening scan. Often noted after an upper respiratory illness. We will be in touch to make sure you have no questions, and to schedule your 6-month scan.  Lung RADS 4 A: nodules with concerning findings, recommendation is most often for a follow up scan in 3 months or additional testing based on our provider's assessment of the scan. We will be in touch to make sure you have no questions and to schedule the recommended 3 month follow up scan.  Lung RADS 4 B:  indicates findings that are concerning. We will be in touch with you to schedule additional diagnostic testing based on our provider's  assessment of the scan.   

## 2020-07-13 NOTE — Progress Notes (Signed)
Shared Decision Making Visit Lung Cancer Screening Program [phone])   Eligibility:  Age 65 y.o.  Pack Years Smoking History Calculation 48 pack year smoking history (# packs/per year x # years smoked)  Recent History of coughing up blood  no  Unexplained weight loss? no ( >Than 15 pounds within the last 6 months )  Prior History Lung / other cancer no (Diagnosis within the last 5 years already requiring surveillance chest CT Scans).  Smoking Status Current Smoker  Former Smokers: Years since quit: NA  Quit Date: NA  Visit Components:  Discussion included one or more decision making aids. yes  Discussion included risk/benefits of screening. yes  Discussion included potential follow up diagnostic testing for abnormal scans. yes  Discussion included meaning and risk of over diagnosis. yes  Discussion included meaning and risk of False Positives. yes  Discussion included meaning of total radiation exposure. yes  Counseling Included:  Importance of adherence to annual lung cancer LDCT screening. yes  Impact of comorbidities on ability to participate in the program. yes  Ability and willingness to under diagnostic treatment. yes  Smoking Cessation Counseling:  Current Smokers:   Discussed importance of smoking cessation. yes  Information about tobacco cessation classes and interventions provided to patient. yes  Patient provided with "ticket" for LDCT Scan. no  Symptomatic Patient. no  Counseling NA  Diagnosis Code: Tobacco Use Z72.0  Asymptomatic Patient yes  Counseling (Intermediate counseling: > three minutes counseling) E0814  Former Smokers:   Discussed the importance of maintaining cigarette abstinence. yes  Diagnosis Code: Personal History of Nicotine Dependence. G81.856  Information about tobacco cessation classes and interventions provided to patient. Yes  Patient provided with "ticket" for LDCT Scan. yes  Written Order for Lung Cancer  Screening with LDCT placed in Epic. Yes (CT Chest Lung Cancer Screening Low Dose W/O CM) [id number] Z12.2-Screening of respiratory organs Z87.891-Personal history of nicotine dependence  BP (!) 152/86 (BP Location: Left Arm, Patient Position: Sitting, Cuff Size: Normal)   Pulse (!) 59   Temp 98 F (36.7 C) (Temporal)   Ht 5\' 11"  (1.803 m)   Wt 245 lb (111.1 kg)   SpO2 96%   BMI 34.17 kg/m    I have spent 25 minutes of face to face time with Mr. Timberman discussing the risks and benefits of lung cancer screening. We viewed a power point together that explained in detail the above noted topics. We paused at intervals to allow for questions to be asked and answered to ensure understanding.We discussed that the single most powerful action that he can take to decrease his risk of developing lung cancer is to quit smoking. We discussed whether or not he is ready to commit to setting a quit date. We discussed options for tools to aid in quitting smoking including nicotine replacement therapy, non-nicotine medications, support groups, Quit Smart classes, and behavior modification. We discussed that often times setting smaller, more achievable goals, such as eliminating 1 cigarette a day for a week and then 2 cigarettes a day for a week can be helpful in slowly decreasing the number of cigarettes smoked. This allows for a sense of accomplishment as well as providing a clinical benefit. I gave him the " Be Stronger Than Your Excuses" card with contact information for community resources, classes, free nicotine replacement therapy, and access to mobile apps, text messaging, and on-line smoking cessation help. I have also given him my card and contact information in the event he needs to contact  me. We discussed the time and location of the scan, and that either Doroteo Glassman RN or I will call with the results within 24-48 hours of receiving them. I have offered him  a copy of the power point we viewed  as a resource  in the event they need reinforcement of the concepts we discussed today in the office. The patient verbalized understanding of all of  the above and had no further questions upon leaving the office. They have my contact information in the event they have any further questions.  I spent 3 minutes counseling on smoking cessation and the health risks of continued tobacco abuse.  I explained to the patient that there has been a high incidence of coronary artery disease noted on these exams. I explained that this is a non-gated exam therefore degree or severity cannot be determined. This patient is currently on statin therapy. I have asked the patient to follow-up with their PCP regarding any incidental finding of coronary artery disease and management with diet or medication as their PCP  feels is clinically indicated. The patient verbalized understanding of the above and had no further questions upon completion of the visit.      Magdalen Spatz, NP 07/13/2020

## 2020-07-14 DIAGNOSIS — M25512 Pain in left shoulder: Secondary | ICD-10-CM

## 2020-07-14 DIAGNOSIS — M7542 Impingement syndrome of left shoulder: Secondary | ICD-10-CM | POA: Diagnosis not present

## 2020-07-14 HISTORY — DX: Pain in left shoulder: M25.512

## 2020-07-19 ENCOUNTER — Telehealth: Payer: Self-pay | Admitting: Acute Care

## 2020-07-19 DIAGNOSIS — F17210 Nicotine dependence, cigarettes, uncomplicated: Secondary | ICD-10-CM

## 2020-07-19 NOTE — Telephone Encounter (Signed)
Please call patient and let them know their low dose Ct was read as a Lung RADS 2: nodules that are benign in appearance and behavior with a very low likelihood of becoming a clinically active cancer due to size or lack of growth. Recommendation per radiology is for a repeat LDCT in 12 months. .Please let them know we will order and schedule their annual screening scan for 07/2021 Please let them know there was notation of CAD on their scan. Please remind the patient that this is a non-gated exam therefore degree or severity of disease cannot be determined. Please have them follow up with their PCP regarding potential risk factor modification, dietary therapy or pharmacologic therapy if clinically indicated. Pt. is currently on statin therapy. Please place order for annual screening scan for 07/2021 and fax results to PCP. Thanks so much.  Pt is aware of results.  Nothing further is needed.

## 2020-07-19 NOTE — Progress Notes (Signed)
Please call patient and let them  know their  low dose Ct was read as a Lung RADS 2: nodules that are benign in appearance and behavior with a very low likelihood of becoming a clinically active cancer due to size or lack of growth. Recommendation per radiology is for a repeat LDCT in 12 months. .Please let them  know we will order and schedule their  annual screening scan for 07/2021. Please let them  know there was notation of CAD on their  scan.  Please remind the patient  that this is a non-gated exam therefore degree or severity of disease  cannot be determined. Please have them  follow up with their PCP regarding potential risk factor modification, dietary therapy or pharmacologic therapy if clinically indicated. Pt.  is  currently on statin therapy. Please place order for annual  screening scan for  07/2021 and fax results to PCP. Thanks so much. 

## 2020-07-20 DIAGNOSIS — E559 Vitamin D deficiency, unspecified: Secondary | ICD-10-CM | POA: Diagnosis not present

## 2020-07-20 DIAGNOSIS — M19012 Primary osteoarthritis, left shoulder: Secondary | ICD-10-CM | POA: Diagnosis not present

## 2020-07-20 DIAGNOSIS — M25572 Pain in left ankle and joints of left foot: Secondary | ICD-10-CM | POA: Diagnosis not present

## 2020-07-20 DIAGNOSIS — M19272 Secondary osteoarthritis, left ankle and foot: Secondary | ICD-10-CM | POA: Diagnosis not present

## 2020-07-20 DIAGNOSIS — G894 Chronic pain syndrome: Secondary | ICD-10-CM | POA: Diagnosis not present

## 2020-07-20 DIAGNOSIS — Z79899 Other long term (current) drug therapy: Secondary | ICD-10-CM | POA: Diagnosis not present

## 2020-07-21 ENCOUNTER — Other Ambulatory Visit: Payer: Self-pay | Admitting: *Deleted

## 2020-07-21 DIAGNOSIS — F17210 Nicotine dependence, cigarettes, uncomplicated: Secondary | ICD-10-CM

## 2020-07-21 DIAGNOSIS — Z87891 Personal history of nicotine dependence: Secondary | ICD-10-CM

## 2020-08-11 DIAGNOSIS — E7800 Pure hypercholesterolemia, unspecified: Secondary | ICD-10-CM | POA: Diagnosis not present

## 2020-08-11 DIAGNOSIS — E1122 Type 2 diabetes mellitus with diabetic chronic kidney disease: Secondary | ICD-10-CM | POA: Diagnosis not present

## 2020-08-11 DIAGNOSIS — I10 Essential (primary) hypertension: Secondary | ICD-10-CM | POA: Diagnosis not present

## 2020-08-11 DIAGNOSIS — N1831 Chronic kidney disease, stage 3a: Secondary | ICD-10-CM | POA: Diagnosis not present

## 2020-08-19 DIAGNOSIS — M19012 Primary osteoarthritis, left shoulder: Secondary | ICD-10-CM | POA: Diagnosis not present

## 2020-08-19 DIAGNOSIS — M19272 Secondary osteoarthritis, left ankle and foot: Secondary | ICD-10-CM | POA: Diagnosis not present

## 2020-08-19 DIAGNOSIS — Z79899 Other long term (current) drug therapy: Secondary | ICD-10-CM | POA: Diagnosis not present

## 2020-08-19 DIAGNOSIS — G894 Chronic pain syndrome: Secondary | ICD-10-CM | POA: Diagnosis not present

## 2020-08-19 DIAGNOSIS — M25572 Pain in left ankle and joints of left foot: Secondary | ICD-10-CM | POA: Diagnosis not present

## 2020-09-16 DIAGNOSIS — G894 Chronic pain syndrome: Secondary | ICD-10-CM | POA: Diagnosis not present

## 2020-09-16 DIAGNOSIS — M25572 Pain in left ankle and joints of left foot: Secondary | ICD-10-CM | POA: Diagnosis not present

## 2020-09-16 DIAGNOSIS — M19012 Primary osteoarthritis, left shoulder: Secondary | ICD-10-CM | POA: Diagnosis not present

## 2020-09-16 DIAGNOSIS — M19272 Secondary osteoarthritis, left ankle and foot: Secondary | ICD-10-CM | POA: Diagnosis not present

## 2020-09-16 DIAGNOSIS — Z79899 Other long term (current) drug therapy: Secondary | ICD-10-CM | POA: Diagnosis not present

## 2020-10-18 DIAGNOSIS — M19272 Secondary osteoarthritis, left ankle and foot: Secondary | ICD-10-CM | POA: Diagnosis not present

## 2020-10-18 DIAGNOSIS — M25572 Pain in left ankle and joints of left foot: Secondary | ICD-10-CM | POA: Diagnosis not present

## 2020-10-18 DIAGNOSIS — G894 Chronic pain syndrome: Secondary | ICD-10-CM | POA: Diagnosis not present

## 2020-10-18 DIAGNOSIS — M19012 Primary osteoarthritis, left shoulder: Secondary | ICD-10-CM | POA: Diagnosis not present

## 2020-10-18 DIAGNOSIS — Z79899 Other long term (current) drug therapy: Secondary | ICD-10-CM | POA: Diagnosis not present

## 2020-10-27 DIAGNOSIS — K429 Umbilical hernia without obstruction or gangrene: Secondary | ICD-10-CM | POA: Diagnosis not present

## 2020-11-01 DIAGNOSIS — N1831 Chronic kidney disease, stage 3a: Secondary | ICD-10-CM | POA: Diagnosis not present

## 2020-11-01 DIAGNOSIS — I10 Essential (primary) hypertension: Secondary | ICD-10-CM | POA: Diagnosis not present

## 2020-11-01 DIAGNOSIS — E7800 Pure hypercholesterolemia, unspecified: Secondary | ICD-10-CM | POA: Diagnosis not present

## 2020-11-01 DIAGNOSIS — E1169 Type 2 diabetes mellitus with other specified complication: Secondary | ICD-10-CM | POA: Diagnosis not present

## 2020-11-16 DIAGNOSIS — M25572 Pain in left ankle and joints of left foot: Secondary | ICD-10-CM | POA: Diagnosis not present

## 2020-11-16 DIAGNOSIS — Z79899 Other long term (current) drug therapy: Secondary | ICD-10-CM | POA: Diagnosis not present

## 2020-11-16 DIAGNOSIS — G894 Chronic pain syndrome: Secondary | ICD-10-CM | POA: Diagnosis not present

## 2020-11-16 DIAGNOSIS — M19272 Secondary osteoarthritis, left ankle and foot: Secondary | ICD-10-CM | POA: Diagnosis not present

## 2020-11-16 DIAGNOSIS — M19012 Primary osteoarthritis, left shoulder: Secondary | ICD-10-CM | POA: Diagnosis not present

## 2020-11-22 DIAGNOSIS — N1831 Chronic kidney disease, stage 3a: Secondary | ICD-10-CM | POA: Diagnosis not present

## 2020-11-22 DIAGNOSIS — E1122 Type 2 diabetes mellitus with diabetic chronic kidney disease: Secondary | ICD-10-CM | POA: Diagnosis not present

## 2020-11-22 DIAGNOSIS — I10 Essential (primary) hypertension: Secondary | ICD-10-CM | POA: Diagnosis not present

## 2020-11-22 DIAGNOSIS — E7800 Pure hypercholesterolemia, unspecified: Secondary | ICD-10-CM | POA: Diagnosis not present

## 2020-11-29 DIAGNOSIS — K5730 Diverticulosis of large intestine without perforation or abscess without bleeding: Secondary | ICD-10-CM | POA: Diagnosis not present

## 2020-11-29 DIAGNOSIS — Z86010 Personal history of colonic polyps: Secondary | ICD-10-CM | POA: Diagnosis not present

## 2020-11-29 DIAGNOSIS — D123 Benign neoplasm of transverse colon: Secondary | ICD-10-CM | POA: Diagnosis not present

## 2020-12-02 DIAGNOSIS — D123 Benign neoplasm of transverse colon: Secondary | ICD-10-CM | POA: Diagnosis not present

## 2020-12-06 DIAGNOSIS — Z720 Tobacco use: Secondary | ICD-10-CM | POA: Diagnosis not present

## 2020-12-06 DIAGNOSIS — B182 Chronic viral hepatitis C: Secondary | ICD-10-CM | POA: Diagnosis not present

## 2020-12-06 DIAGNOSIS — E119 Type 2 diabetes mellitus without complications: Secondary | ICD-10-CM | POA: Diagnosis not present

## 2020-12-06 DIAGNOSIS — K429 Umbilical hernia without obstruction or gangrene: Secondary | ICD-10-CM | POA: Diagnosis not present

## 2020-12-09 ENCOUNTER — Other Ambulatory Visit: Payer: Self-pay | Admitting: General Surgery

## 2020-12-09 DIAGNOSIS — K429 Umbilical hernia without obstruction or gangrene: Secondary | ICD-10-CM

## 2020-12-15 DIAGNOSIS — M19012 Primary osteoarthritis, left shoulder: Secondary | ICD-10-CM | POA: Diagnosis not present

## 2020-12-15 DIAGNOSIS — M19272 Secondary osteoarthritis, left ankle and foot: Secondary | ICD-10-CM | POA: Diagnosis not present

## 2020-12-15 DIAGNOSIS — M25572 Pain in left ankle and joints of left foot: Secondary | ICD-10-CM | POA: Diagnosis not present

## 2020-12-15 DIAGNOSIS — G894 Chronic pain syndrome: Secondary | ICD-10-CM | POA: Diagnosis not present

## 2020-12-15 DIAGNOSIS — Z79899 Other long term (current) drug therapy: Secondary | ICD-10-CM | POA: Diagnosis not present

## 2020-12-20 ENCOUNTER — Other Ambulatory Visit: Payer: Self-pay

## 2020-12-20 ENCOUNTER — Ambulatory Visit
Admission: RE | Admit: 2020-12-20 | Discharge: 2020-12-20 | Disposition: A | Discharge status: Home / Self Care | Payer: Medicare Other | Source: Ambulatory Visit | Attending: General Surgery | Admitting: General Surgery

## 2020-12-20 DIAGNOSIS — K5730 Diverticulosis of large intestine without perforation or abscess without bleeding: Secondary | ICD-10-CM | POA: Diagnosis not present

## 2020-12-20 DIAGNOSIS — K429 Umbilical hernia without obstruction or gangrene: Secondary | ICD-10-CM

## 2021-01-05 DIAGNOSIS — N1831 Chronic kidney disease, stage 3a: Secondary | ICD-10-CM | POA: Diagnosis not present

## 2021-01-05 DIAGNOSIS — E1169 Type 2 diabetes mellitus with other specified complication: Secondary | ICD-10-CM | POA: Diagnosis not present

## 2021-01-05 DIAGNOSIS — E7800 Pure hypercholesterolemia, unspecified: Secondary | ICD-10-CM | POA: Diagnosis not present

## 2021-01-05 DIAGNOSIS — E1122 Type 2 diabetes mellitus with diabetic chronic kidney disease: Secondary | ICD-10-CM | POA: Diagnosis not present

## 2021-01-12 DIAGNOSIS — G629 Polyneuropathy, unspecified: Secondary | ICD-10-CM | POA: Diagnosis not present

## 2021-01-12 DIAGNOSIS — Z79899 Other long term (current) drug therapy: Secondary | ICD-10-CM | POA: Diagnosis not present

## 2021-01-12 DIAGNOSIS — M62830 Muscle spasm of back: Secondary | ICD-10-CM | POA: Diagnosis not present

## 2021-01-12 DIAGNOSIS — M542 Cervicalgia: Secondary | ICD-10-CM | POA: Diagnosis not present

## 2021-01-12 DIAGNOSIS — M546 Pain in thoracic spine: Secondary | ICD-10-CM | POA: Diagnosis not present

## 2021-01-18 DIAGNOSIS — K429 Umbilical hernia without obstruction or gangrene: Secondary | ICD-10-CM | POA: Diagnosis not present

## 2021-02-02 NOTE — Progress Notes (Signed)
Sent message, via epic in basket, requesting orders in epic from surgeon.  

## 2021-02-07 NOTE — Patient Instructions (Addendum)
DUE TO COVID-19 ONLY ONE VISITOR IS ALLOWED TO COME WITH YOU AND STAY IN THE WAITING ROOM ONLY DURING PRE OP AND PROCEDURE DAY OF SURGERY IF YOU ARE GOING HOME AFTER SURGERY. IF YOU ARE SPENDING THE NIGHT 2 PEOPLE MAY VISIT WITH YOU IN YOUR PRIVATE ROOM AFTER SURGERY UNTIL VISITING  HOURS ARE OVER AT 800 PM AND THE 2 VISITORS CANNOT SPEND THE NIGHT.                  [name]     Your procedure is scheduled on: 02/23/21   Report to [facility] Main  Entrance   Report to admitting at   8:15 AM     Call this number if you have problems the morning of surgery [phone]    Remember: Do not eat food  :After Midnight the night before your surgery,     You may have clear liquids from midnight until ---.7:00 am    CLEAR LIQUID DIET   Foods Allowed                                                                     Foods Excluded  Coffee and tea, regular and decaf    No milk or creamer in coffee                                              liquids that you cannot  Plain Jell-O any favor except red or purple                                           see through such as: Fruit ices (not with fruit pulp)                                     milk, soups, orange juice  Iced Popsicles                                    All solid food Carbonated beverages, regular and diet                                    Cranberry, grape and apple juices Sports drinks like Gatorade Lightly seasoned clear broth or consume(fat free) Sugar    BRUSH YOUR TEETH MORNING OF SURGERY AND RINSE YOUR MOUTH OUT, NO CHEWING GUM CANDY OR MINTS.     Take these medicines the morning of surgery with A SIP OF WATER: Metoprolol, Amlodipine, Omeprazole,              Use your inhaler and bring it with you     How to Manage Your Diabetes Before and After Surgery  Why is it important to control my blood sugar before and after surgery? Improving blood sugar levels before  and after surgery helps healing and  can limit problems. A way of improving blood sugar control is eating a healthy diet by:  Eating less sugar and carbohydrates  Increasing activity/exercise  Talking with your doctor about reaching your blood sugar goals High blood sugars (greater than 180 mg/dL) can raise your risk of infections and slow your recovery, so you will need to focus on controlling your diabetes during the weeks before surgery. Make sure that the doctor who takes care of your diabetes knows about your planned surgery including the date and location.  How do I manage my blood sugar before surgery? Check your blood sugar at least 4 times a day, starting 2 days before surgery, to make sure that the level is not too high or low. Check your blood sugar the morning of your surgery when you wake up and every 2 hours until you get to the Short Stay unit. If your blood sugar is less than 70 mg/dL, you will need to treat for low blood sugar: Do not take insulin. Treat a low blood sugar (less than 70 mg/dL) with  cup of clear juice (cranberry or apple), 4 glucose tablets, OR glucose gel. Recheck blood sugar in 15 minutes after treatment (to make sure it is greater than 70 mg/dL). If your blood sugar is not greater than 70 mg/dL on recheck, call [phone] for further instructions. Report your blood sugar to the short stay nurse when you get to Short Stay.  If you are admitted to the hospital after surgery: Your blood sugar will be checked by the staff and you will probably be given insulin after surgery (instead of oral diabetes medicines) to make sure you have good blood sugar levels. The goal for blood sugar control after surgery is 80-180 mg/dL.   WHAT DO I DO ABOUT MY DIABETES MEDICATION?  Do not take oral diabetes medicines (pills) the morning of surgery.                           You may not have any metal on your body including              piercings  Do not wear jewelry, lotions, powders or  deodorant                         Men may shave face and neck.   Do not bring valuables to the hospital. [place].  Contacts, dentures or bridgework may not be worn into surgery.       Patients discharged the day of surgery will not be allowed to drive home.  IF YOU ARE HAVING SURGERY AND GOING HOME THE SAME DAY, YOU MUST HAVE AN ADULT TO DRIVE YOU HOME AND BE WITH YOU FOR 24 HOURS.  YOU MAY GO HOME BY TAXI OR UBER OR ORTHERWISE, BUT AN ADULT MUST ACCOMPANY YOU HOME AND STAY WITH YOU FOR 24 HOURS.  Name and phone number of your driver:  Special Instructions: N/A              Please read over the following fact sheets you were given: _____________________________________________________________________             [facility] - Preparing for Surgery Before surgery, you can play an important role.  Because skin is not sterile, your  skin needs to be as free of germs as possible.  You can reduce the number of germs on your skin by washing with CHG (chlorahexidine gluconate) soap before surgery.  CHG is an antiseptic cleaner which kills germs and bonds with the skin to continue killing germs even after washing. Please DO NOT use if you have an allergy to CHG or antibacterial soaps.  If your skin becomes reddened/irritated stop using the CHG and inform your nurse when you arrive at Short Stay.  You may shave your face/neck. Please follow these instructions carefully:  1.  Shower with CHG Soap the night before surgery and the  morning of Surgery.  2.  If you choose to wash your hair, wash your hair first as usual with your  normal  shampoo.  3.  After you shampoo, rinse your hair and body thoroughly to remove the  shampoo.                            4.  Use CHG as you would any other liquid soap.  You can apply chg directly  to the skin and wash                       Gently with a scrungie or clean washcloth.  5.  Apply the CHG Soap to your body ONLY FROM THE NECK  DOWN.   Do not use on face/ open                           Wound or open sores. Avoid contact with eyes, ears mouth and genitals (private parts).                       Wash face,  Genitals (private parts) with your normal soap.             6.  Wash thoroughly, paying special attention to the area where your surgery  will be performed.  7.  Thoroughly rinse your body with warm water from the neck down.  8.  DO NOT shower/wash with your normal soap after using and rinsing off  the CHG Soap.                9.  Pat yourself dry with a clean towel.            10.  Wear clean pajamas.            11.  Place clean sheets on your bed the night of your first shower and do not  sleep with pets. Day of Surgery : Do not apply any lotions/deodorants the morning of surgery.  Please wear clean clothes to the hospital/surgery center.  FAILURE TO FOLLOW THESE INSTRUCTIONS MAY RESULT IN THE CANCELLATION OF YOUR SURGERY PATIENT SIGNATURE_________________________________  NURSE SIGNATURE__________________________________  ________________________________________________________________________

## 2021-02-09 ENCOUNTER — Encounter (HOSPITAL_COMMUNITY): Payer: Self-pay

## 2021-02-09 ENCOUNTER — Other Ambulatory Visit: Payer: Self-pay

## 2021-02-09 ENCOUNTER — Encounter (HOSPITAL_COMMUNITY)
Admission: RE | Admit: 2021-02-09 | Discharge: 2021-02-09 | Disposition: A | Discharge status: Home / Self Care | Payer: Medicare Other | Source: Ambulatory Visit | Attending: General Surgery | Admitting: General Surgery

## 2021-02-09 ENCOUNTER — Ambulatory Visit: Payer: Self-pay | Admitting: General Surgery

## 2021-02-09 DIAGNOSIS — Z01818 Encounter for other preprocedural examination: Secondary | ICD-10-CM | POA: Insufficient documentation

## 2021-02-09 DIAGNOSIS — Z79899 Other long term (current) drug therapy: Secondary | ICD-10-CM | POA: Diagnosis not present

## 2021-02-09 HISTORY — DX: Unspecified osteoarthritis, unspecified site: M19.90

## 2021-02-09 HISTORY — DX: Chronic pain due to trauma: G89.21

## 2021-02-09 HISTORY — DX: Unspecified asthma, uncomplicated: J45.909

## 2021-02-09 HISTORY — DX: Myoneural disorder, unspecified: G70.9

## 2021-02-09 LAB — BASIC METABOLIC PANEL
Anion gap: 11 (ref 5–15)
BUN: 15 mg/dL (ref 8–23)
CO2: 26 mmol/L (ref 22–32)
Calcium: 9.7 mg/dL (ref 8.9–10.3)
Chloride: 99 mmol/L (ref 98–111)
Creatinine, Ser: 1.07 mg/dL (ref 0.61–1.24)
GFR, Estimated: >60 mL/min (ref >60)
Glucose, Bld: 122 mg/dL — ABNORMAL HIGH (ref 70–99)
Potassium: 5.6 mmol/L — ABNORMAL HIGH (ref 3.5–5.1)
Sodium: 136 mmol/L (ref 135–145)

## 2021-02-09 LAB — CBC
HCT: 47.9 % (ref 39.0–52.0)
Hemoglobin: 16.3 g/dL (ref 13.0–17.0)
MCH: 32.4 pg (ref 26.0–34.0)
MCHC: 34 g/dL (ref 30.0–36.0)
MCV: 95.2 fL (ref 80.0–100.0)
Platelets: 289 10*3/uL (ref 150–400)
RBC: 5.03 MIL/uL (ref 4.22–5.81)
RDW: 12.9 % (ref 11.5–15.5)
WBC: 11.4 10*3/uL — ABNORMAL HIGH (ref 4.0–10.5)
nRBC: 0 % (ref 0.0–0.2)

## 2021-02-09 LAB — GLUCOSE, CAPILLARY: Glucose-Capillary: 118 mg/dL — ABNORMAL HIGH (ref 70–99)

## 2021-02-09 NOTE — Progress Notes (Signed)
COVID test- NA   PCP - Dr. Matilde Haymaker LOV 03/16/20 Cardiologist - none Pain Clinic - Dr. Abran Cantor, [facility] medical  Chest x-ray - no EKG - 02/09/21-chart Stress Test - no ECHO - no Cardiac Cath - no Pacemaker/ICD device last checked:NA  Sleep Study - no CPAP -   Fasting Blood Sugar - Pt doesn't test Checks Blood Sugar _____ times a day  Blood Thinner Instructions:NA Aspirin Instructions: Last Dose:  Anesthesia review: yes  Patient denies shortness of breath, fever, cough and chest pain at PAT appointment. Yes  Pt has nasal congestion at PAT visit. He had been snorting his percocet for about a month because of his teeth.  He stopped around 9/22 because of the congestion. He now dissolves it in hot water and takes it that way. Anesthesia is aware. I told Pt to call Dr. If he isn't improved close to DOS.   Patient verbalized understanding of instructions that were given to them at the PAT appointment. Patient was also instructed that they will need to review over the PAT instructions again at home before surgery. yes

## 2021-02-11 LAB — HEMOGLOBIN A1C
Hgb A1c MFr Bld: 5.7 % — ABNORMAL HIGH (ref 4.8–5.6)
Mean Plasma Glucose: 117 mg/dL

## 2021-02-13 DIAGNOSIS — M2351 Chronic instability of knee, right knee: Secondary | ICD-10-CM | POA: Diagnosis not present

## 2021-02-13 DIAGNOSIS — G8929 Other chronic pain: Secondary | ICD-10-CM | POA: Diagnosis not present

## 2021-02-13 DIAGNOSIS — G629 Polyneuropathy, unspecified: Secondary | ICD-10-CM | POA: Diagnosis not present

## 2021-02-13 DIAGNOSIS — M25551 Pain in right hip: Secondary | ICD-10-CM | POA: Diagnosis not present

## 2021-02-13 DIAGNOSIS — Z79899 Other long term (current) drug therapy: Secondary | ICD-10-CM | POA: Diagnosis not present

## 2021-02-14 DIAGNOSIS — E1169 Type 2 diabetes mellitus with other specified complication: Secondary | ICD-10-CM | POA: Diagnosis not present

## 2021-02-14 DIAGNOSIS — E7800 Pure hypercholesterolemia, unspecified: Secondary | ICD-10-CM | POA: Diagnosis not present

## 2021-02-14 DIAGNOSIS — E1165 Type 2 diabetes mellitus with hyperglycemia: Secondary | ICD-10-CM | POA: Diagnosis not present

## 2021-02-14 DIAGNOSIS — I10 Essential (primary) hypertension: Secondary | ICD-10-CM | POA: Diagnosis not present

## 2021-02-23 ENCOUNTER — Encounter (HOSPITAL_COMMUNITY)
Admission: RE | Disposition: A | Discharge status: Home / Self Care | Payer: Self-pay | Source: Home / Self Care | Attending: General Surgery

## 2021-02-23 ENCOUNTER — Ambulatory Visit (HOSPITAL_COMMUNITY): Payer: Medicare Other | Admitting: Anesthesiology

## 2021-02-23 ENCOUNTER — Ambulatory Visit (HOSPITAL_COMMUNITY)
Admission: RE | Admit: 2021-02-23 | Discharge: 2021-02-23 | Disposition: A | Discharge status: Home / Self Care | Payer: Medicare Other | Attending: General Surgery | Admitting: General Surgery

## 2021-02-23 ENCOUNTER — Ambulatory Visit (HOSPITAL_COMMUNITY): Payer: Medicare Other | Admitting: Physician Assistant

## 2021-02-23 ENCOUNTER — Encounter (HOSPITAL_COMMUNITY): Payer: Self-pay | Admitting: General Surgery

## 2021-02-23 ENCOUNTER — Other Ambulatory Visit: Payer: Self-pay

## 2021-02-23 DIAGNOSIS — Z7984 Long term (current) use of oral hypoglycemic drugs: Secondary | ICD-10-CM | POA: Diagnosis not present

## 2021-02-23 DIAGNOSIS — I10 Essential (primary) hypertension: Secondary | ICD-10-CM | POA: Diagnosis not present

## 2021-02-23 DIAGNOSIS — Z885 Allergy status to narcotic agent status: Secondary | ICD-10-CM | POA: Insufficient documentation

## 2021-02-23 DIAGNOSIS — K219 Gastro-esophageal reflux disease without esophagitis: Secondary | ICD-10-CM | POA: Diagnosis not present

## 2021-02-23 DIAGNOSIS — K429 Umbilical hernia without obstruction or gangrene: Secondary | ICD-10-CM | POA: Insufficient documentation

## 2021-02-23 DIAGNOSIS — E119 Type 2 diabetes mellitus without complications: Secondary | ICD-10-CM | POA: Diagnosis not present

## 2021-02-23 DIAGNOSIS — Z79899 Other long term (current) drug therapy: Secondary | ICD-10-CM | POA: Insufficient documentation

## 2021-02-23 HISTORY — PX: UMBILICAL HERNIA REPAIR: SHX196

## 2021-02-23 LAB — GLUCOSE, CAPILLARY
Glucose-Capillary: 116 mg/dL — ABNORMAL HIGH (ref 70–99)
Glucose-Capillary: 148 mg/dL — ABNORMAL HIGH (ref 70–99)

## 2021-02-23 SURGERY — REPAIR, HERNIA, UMBILICAL, ADULT
Anesthesia: General | Site: Abdomen

## 2021-02-23 MED ORDER — BUPIVACAINE LIPOSOME 1.3 % IJ SUSP
INTRAMUSCULAR | Status: AC
  Filled 2021-02-23: qty 20

## 2021-02-23 MED ORDER — BUPIVACAINE-EPINEPHRINE 0.25% -1:200000 IJ SOLN
INTRAMUSCULAR | Status: DC | PRN
  Administered 2021-02-23: 30 mL

## 2021-02-23 MED ORDER — CELECOXIB 200 MG PO CAPS
400.0000 mg | ORAL_CAPSULE | ORAL | Status: AC
  Administered 2021-02-23: 400 mg via ORAL
  Filled 2021-02-23: qty 2

## 2021-02-23 MED ORDER — 0.9 % SODIUM CHLORIDE (POUR BTL) OPTIME
TOPICAL | Status: DC | PRN
  Administered 2021-02-23: 1000 mL

## 2021-02-23 MED ORDER — ALBUTEROL SULFATE HFA 108 (90 BASE) MCG/ACT IN AERS
INHALATION_SPRAY | RESPIRATORY_TRACT | Status: AC
  Filled 2021-02-23: qty 6.7

## 2021-02-23 MED ORDER — ONDANSETRON HCL 4 MG/2ML IJ SOLN
4.0000 mg | Freq: Once | INTRAMUSCULAR | Status: DC | PRN
Start: 2021-02-23 — End: 2021-02-23

## 2021-02-23 MED ORDER — FENTANYL CITRATE PF 50 MCG/ML IJ SOSY
PREFILLED_SYRINGE | INTRAMUSCULAR | Status: AC
  Filled 2021-02-23: qty 3

## 2021-02-23 MED ORDER — CHLORHEXIDINE GLUCONATE CLOTH 2 % EX PADS: 6.0000 | MEDICATED_PAD | Freq: Once | CUTANEOUS | Status: DC

## 2021-02-23 MED ORDER — IBUPROFEN 800 MG PO TABS: 800.0000 mg | ORAL_TABLET | Freq: Three times a day (TID) | ORAL | 0 refills | Status: DC | PRN

## 2021-02-23 MED ORDER — ACETAMINOPHEN 500 MG PO TABS
1000.0000 mg | ORAL_TABLET | ORAL | Status: AC
  Administered 2021-02-23: 1000 mg via ORAL
  Filled 2021-02-23: qty 2

## 2021-02-23 MED ORDER — MIDAZOLAM HCL 2 MG/2ML IJ SOLN
INTRAMUSCULAR | Status: AC
  Filled 2021-02-23: qty 2

## 2021-02-23 MED ORDER — PROPOFOL 10 MG/ML IV BOLUS
INTRAVENOUS | Status: AC
  Filled 2021-02-23: qty 20

## 2021-02-23 MED ORDER — MIDAZOLAM HCL 2 MG/2ML IJ SOLN
INTRAMUSCULAR | Status: DC | PRN
  Administered 2021-02-23: 2 mg via INTRAVENOUS

## 2021-02-23 MED ORDER — EPHEDRINE SULFATE-NACL 50-0.9 MG/10ML-% IV SOSY
PREFILLED_SYRINGE | INTRAVENOUS | Status: DC | PRN
  Administered 2021-02-23: 10 mg via INTRAVENOUS
  Administered 2021-02-23: 5 mg via INTRAVENOUS

## 2021-02-23 MED ORDER — KETOROLAC TROMETHAMINE 30 MG/ML IJ SOLN
30.0000 mg | Freq: Once | INTRAMUSCULAR | Status: AC | PRN
  Administered 2021-02-23: 30 mg via INTRAVENOUS

## 2021-02-23 MED ORDER — ALBUTEROL SULFATE HFA 108 (90 BASE) MCG/ACT IN AERS
INHALATION_SPRAY | RESPIRATORY_TRACT | Status: DC | PRN
  Administered 2021-02-23: 6 via RESPIRATORY_TRACT

## 2021-02-23 MED ORDER — LACTATED RINGERS IV SOLN: INTRAVENOUS | Status: DC

## 2021-02-23 MED ORDER — FENTANYL CITRATE (PF) 100 MCG/2ML IJ SOLN
INTRAMUSCULAR | Status: AC
  Filled 2021-02-23: qty 2

## 2021-02-23 MED ORDER — OXYCODONE HCL 5 MG PO TABS
ORAL_TABLET | ORAL | Status: AC
  Administered 2021-02-23: 5 mg via ORAL
  Filled 2021-02-23: qty 1

## 2021-02-23 MED ORDER — EPHEDRINE 5 MG/ML INJ
INTRAVENOUS | Status: AC
  Filled 2021-02-23: qty 5

## 2021-02-23 MED ORDER — OXYCODONE HCL 10 MG PO TABS: 10.0000 mg | ORAL_TABLET | ORAL | 0 refills | Status: DC | PRN

## 2021-02-23 MED ORDER — ENSURE PRE-SURGERY PO LIQD
296.0000 mL | Freq: Once | ORAL | Status: DC
  Filled 2021-02-23: qty 296

## 2021-02-23 MED ORDER — FENTANYL CITRATE PF 50 MCG/ML IJ SOSY
25.0000 ug | PREFILLED_SYRINGE | INTRAMUSCULAR | Status: DC | PRN
  Administered 2021-02-23 (×3): 50 ug via INTRAVENOUS

## 2021-02-23 MED ORDER — OXYCODONE HCL 5 MG PO TABS: 5.0000 mg | ORAL_TABLET | Freq: Once | ORAL | Status: AC | PRN

## 2021-02-23 MED ORDER — SUGAMMADEX SODIUM 200 MG/2ML IV SOLN
INTRAVENOUS | Status: DC | PRN
  Administered 2021-02-23: 200 mg via INTRAVENOUS

## 2021-02-23 MED ORDER — ORAL CARE MOUTH RINSE: 15.0000 mL | Freq: Once | OROMUCOSAL | Status: AC

## 2021-02-23 MED ORDER — BUPIVACAINE-EPINEPHRINE (PF) 0.25% -1:200000 IJ SOLN
INTRAMUSCULAR | Status: AC
  Filled 2021-02-23: qty 30

## 2021-02-23 MED ORDER — PROPOFOL 10 MG/ML IV BOLUS
INTRAVENOUS | Status: DC | PRN
  Administered 2021-02-23: 50 mg via INTRAVENOUS
  Administered 2021-02-23: 150 mg via INTRAVENOUS

## 2021-02-23 MED ORDER — DEXAMETHASONE SODIUM PHOSPHATE 10 MG/ML IJ SOLN
INTRAMUSCULAR | Status: DC | PRN
  Administered 2021-02-23: 5 mg via INTRAVENOUS

## 2021-02-23 MED ORDER — ONDANSETRON HCL 4 MG/2ML IJ SOLN
INTRAMUSCULAR | Status: AC
  Filled 2021-02-23: qty 2

## 2021-02-23 MED ORDER — OXYCODONE HCL 5 MG/5ML PO SOLN: 5.0000 mg | Freq: Once | ORAL | Status: AC | PRN

## 2021-02-23 MED ORDER — DEXAMETHASONE SODIUM PHOSPHATE 10 MG/ML IJ SOLN
INTRAMUSCULAR | Status: AC
  Filled 2021-02-23: qty 1

## 2021-02-23 MED ORDER — LIDOCAINE HCL 2 % IJ SOLN
INTRAMUSCULAR | Status: AC
  Filled 2021-02-23: qty 20

## 2021-02-23 MED ORDER — KETOROLAC TROMETHAMINE 30 MG/ML IJ SOLN
INTRAMUSCULAR | Status: AC
  Filled 2021-02-23: qty 1

## 2021-02-23 MED ORDER — ROCURONIUM BROMIDE 10 MG/ML (PF) SYRINGE
PREFILLED_SYRINGE | INTRAVENOUS | Status: DC | PRN
  Administered 2021-02-23: 60 mg via INTRAVENOUS

## 2021-02-23 MED ORDER — BUPIVACAINE LIPOSOME 1.3 % IJ SUSP
INTRAMUSCULAR | Status: DC | PRN
Start: 2021-02-23 — End: 2021-02-23
  Administered 2021-02-23: 20 mL

## 2021-02-23 MED ORDER — MEPERIDINE HCL 50 MG/ML IJ SOLN: 6.2500 mg | INTRAMUSCULAR | Status: DC | PRN

## 2021-02-23 MED ORDER — CEFAZOLIN SODIUM-DEXTROSE 2-4 GM/100ML-% IV SOLN
2.0000 g | INTRAVENOUS | Status: AC
  Administered 2021-02-23: 2 g via INTRAVENOUS
  Filled 2021-02-23: qty 100

## 2021-02-23 MED ORDER — LIDOCAINE 2% (20 MG/ML) 5 ML SYRINGE
INTRAMUSCULAR | Status: DC | PRN
  Administered 2021-02-23: 60 mg via INTRAVENOUS

## 2021-02-23 MED ORDER — CHLORHEXIDINE GLUCONATE 0.12 % MT SOLN
15.0000 mL | Freq: Once | OROMUCOSAL | Status: AC
  Administered 2021-02-23: 15 mL via OROMUCOSAL

## 2021-02-23 MED ORDER — ONDANSETRON HCL 4 MG/2ML IJ SOLN
INTRAMUSCULAR | Status: DC | PRN
Start: 2021-02-23 — End: 2021-02-23
  Administered 2021-02-23: 4 mg via INTRAVENOUS

## 2021-02-23 MED ORDER — FENTANYL CITRATE (PF) 250 MCG/5ML IJ SOLN
INTRAMUSCULAR | Status: DC | PRN
  Administered 2021-02-23: 100 ug via INTRAVENOUS
  Administered 2021-02-23 (×4): 50 ug via INTRAVENOUS

## 2021-02-23 SURGICAL SUPPLY — 30 items
ADH SKN CLS APL DERMABOND .7 (GAUZE/BANDAGES/DRESSINGS) ×1
APL PRP STRL LF DISP 70% ISPRP (MISCELLANEOUS) ×1
BAG COUNTER SPONGE SURGICOUNT (BAG) IMPLANT
BAG SPNG CNTER NS LX DISP (BAG)
CHLORAPREP W/TINT 26 (MISCELLANEOUS) ×2 IMPLANT
COVER SURGICAL LIGHT HANDLE (MISCELLANEOUS) ×2 IMPLANT
DECANTER SPIKE VIAL GLASS SM (MISCELLANEOUS) ×2 IMPLANT
DERMABOND ADVANCED (GAUZE/BANDAGES/DRESSINGS) ×1
DERMABOND ADVANCED .7 DNX12 (GAUZE/BANDAGES/DRESSINGS) ×1 IMPLANT
DRAPE LAPAROSCOPIC ABDOMINAL (DRAPES) ×2 IMPLANT
ELECT REM PT RETURN 15FT ADLT (MISCELLANEOUS) ×2 IMPLANT
GLOVE SURG POLYISO LF SZ7 (GLOVE) ×2 IMPLANT
GLOVE SURG UNDER POLY LF SZ7 (GLOVE) ×2 IMPLANT
GOWN STRL REUS W/TWL LRG LVL3 (GOWN DISPOSABLE) ×2 IMPLANT
GOWN STRL REUS W/TWL XL LVL3 (GOWN DISPOSABLE) ×2 IMPLANT
KIT BASIN OR (CUSTOM PROCEDURE TRAY) ×2 IMPLANT
KIT TURNOVER KIT A (KITS) ×2 IMPLANT
MESH HERNIA 6X6 BARD (Mesh General) ×1 IMPLANT
MESH HERNIA BARD 6X6 (Mesh General) ×1 IMPLANT
NEEDLE HYPO 22GX1.5 SAFETY (NEEDLE) ×2 IMPLANT
PACK BASIC VI WITH GOWN DISP (CUSTOM PROCEDURE TRAY) ×2 IMPLANT
PENCIL SMOKE EVACUATOR (MISCELLANEOUS) IMPLANT
SPONGE T-LAP 4X18 ~~LOC~~+RFID (SPONGE) ×2 IMPLANT
SUT MNCRL AB 4-0 PS2 18 (SUTURE) ×2 IMPLANT
SUT NOVA NAB GS-21 0 18 T12 DT (SUTURE) IMPLANT
SUT VIC AB 3-0 SH 27 (SUTURE) ×2
SUT VIC AB 3-0 SH 27X BRD (SUTURE) ×1 IMPLANT
SYR CONTROL 10ML LL (SYRINGE) ×2 IMPLANT
TOWEL OR 17X26 10 PK STRL BLUE (TOWEL DISPOSABLE) ×2 IMPLANT
TOWEL OR NON WOVEN STRL DISP B (DISPOSABLE) ×2 IMPLANT

## 2021-02-23 NOTE — Anesthesia Procedure Notes (Signed)
Procedure Name: Intubation Date/Time: 02/23/2021 10:20 AM Performed by: Lollie Sails, CRNA Pre-anesthesia Checklist: Patient identified, Emergency Drugs available, Suction available, Patient being monitored and Timeout performed Patient Re-evaluated:Patient Re-evaluated prior to induction Oxygen Delivery Method: Circle system utilized Preoxygenation: Pre-oxygenation with 100% oxygen Induction Type: IV induction Ventilation: Mask ventilation without difficulty Laryngoscope Size: Miller and 3 Grade View: Grade I Tube type: Oral Tube size: 7.5 mm Number of attempts: 1 Airway Equipment and Method: Stylet Placement Confirmation: ETT inserted through vocal cords under direct vision, positive ETCO2 and breath sounds checked- equal and bilateral Secured at: 24 cm Tube secured with: Tape Dental Injury: Teeth and Oropharynx as per pre-operative assessment

## 2021-02-23 NOTE — Discharge Instructions (Signed)
[facility] _______[facility]  UMBILICAL OR INGUINAL HERNIA REPAIR: POST OP INSTRUCTIONS  Always review your discharge instruction sheet given to you by the facility where your surgery was performed. IF YOU HAVE DISABILITY OR FAMILY LEAVE FORMS, YOU MUST BRING THEM TO THE OFFICE FOR PROCESSING.   DO NOT GIVE THEM TO YOUR DOCTOR.  1. A  prescription for pain medication may be given to you upon discharge.  Take your pain medication as prescribed, if needed.  If narcotic pain medicine is not needed, then you may take acetaminophen (Tylenol) or ibuprofen (Advil) as needed. 2. Take your usually prescribed medications unless otherwise directed. If you need a refill on your pain medication, please contact your pharmacy.  They will contact our office to request authorization. Prescriptions will not be filled after 5 pm or on week-ends. 3. You should follow a light diet the first 24 hours after arrival home, such as soup and crackers, etc.  Be sure to include lots of fluids daily.  Resume your normal diet the day after surgery. 4.Most patients will experience some swelling and bruising around the umbilicus or in the groin and scrotum.  Ice packs and reclining will help.  Swelling and bruising can take several days to resolve.  6. It is common to experience some constipation if taking pain medication after surgery.  Increasing fluid intake and taking a stool softener (such as Colace) will usually help or prevent this problem from occurring.  A mild laxative (Milk of Magnesia or Miralax) should be taken according to package directions if there are no bowel movements after 48 hours. 7. Unless discharge instructions indicate otherwise, you may remove your bandages 24-48 hours after surgery, and you may shower at that time.  You may have steri-strips (small skin tapes) in place directly over the incision.  These strips should be left on the skin for 7-10 days.  If your surgeon used skin glue on the  incision, you may shower in 24 hours.  The glue will flake off over the next 2-3 weeks.  Any sutures or staples will be removed at the office during your follow-up visit. 8. ACTIVITIES:  You may resume regular (light) daily activities beginning the next day--such as daily self-care, walking, climbing stairs--gradually increasing activities as tolerated.  You may have sexual intercourse when it is comfortable.  Refrain from any heavy lifting or straining until approved by your doctor.  a.You may drive when you are no longer taking prescription pain medication, you can comfortably wear a seatbelt, and you can safely maneuver your car and apply brakes. b.RETURN TO WORK:   _____________________________________________  9.You should see your doctor in the office for a follow-up appointment approximately 2-3 weeks after your surgery.  Make sure that you call for this appointment within a day or two after you arrive home to insure a convenient appointment time. 10.OTHER INSTRUCTIONS: _________________________    _____________________________________  WHEN TO CALL YOUR DOCTOR: Fever over 101.0 Inability to urinate Nausea and/or vomiting Extreme swelling or bruising Continued bleeding from incision. Increased pain, redness, or drainage from the incision  The clinic staff is available to answer your questions during regular business hours.  Please don't hesitate to call and ask to speak to one of the nurses for clinical concerns.  If you have a medical emergency, go to the nearest emergency room or call 911.  A surgeon from [facility] is always on call at the hospital   [address], [address],   [place], [place]  [address] ?  [address] [address], [address],    [address] [phone] ? [phone] ? FAX [phone] Web site: [email or web address]  

## 2021-02-23 NOTE — Op Note (Addendum)
PATIENT:  [name]  65 y.o. male  PRE-OPERATIVE DIAGNOSIS:  UMBILICAL HERNIA  POST-OPERATIVE DIAGNOSIS:  UMBILICAL HERNIA  PROCEDURE:  Procedure(s): OPEN HERNIA REPAIR UMBILICAL ADULT WITH MESH   SURGEON:  Surgeon(s): Getsemani Lindon, Arta Bruce, MD  ASSISTANT: Genella Mech, M.D. resident  ANESTHESIA:   local and general  Indications for procedure: [name] is a 65 y.o. year old male with symptoms of abdominal pain and swelling.  Description of procedure: The patient was brought into the operative suite. Anesthesia was administered with General endotracheal anesthesia. WHO checklist was applied. The patient was then placed in supine. The area was prepped and draped in the usual sterile fashion.  Next the infraumbilical skin was anesthetized with Marcaine/Exparel mix. A semilunar infraumbilical incision was made. Cautery and blunt dissection was used to dissect down to the fascia. The hernia sac was dissected free from surrounding tissues in 360 degrees. The umbilical skin was dissected free of the hernia sac with cautery. The hernia sac was reduced and contained no visceral structures. There were 2 hernias in the area each about 2 cm in diameter. The hernia sac was removed. The preperitoneum was the closed with 3-0 vicryl. Due to the size of the hernia, a 15 x 15 cm Bard mesh was inserted into the preperitoneal space. Care was taken to lay the mesh flat. Novafil was used to suture the mesh to the rectus in 4 areas. The fascial defect was then primarily closed with interrupted 0 PDS sutures. The umbilical skin was sutured to the fascia with a 3-0 vicryl. The deep dermal space was closed with a 3-0 vicryl. Marcaine/Exparel mix was injected into the muscle layer and around the fascia. The skin was closed with a 4-0 monocryl subcuticular suture. Dermabond was put in place for dressing. The patient awoke from anesthesia and was brought to pacu in stable condition. All counts were  correct.  Findings: 2 2 cm hernias  Specimen: none  Blood loss: 20  ml  Local anesthesia: 50 ml Marcaine/Exparel mix  Complications: none  PLAN OF CARE: Discharge to home after PACU  PATIENT DISPOSITION:  PACU - hemodynamically stable.  Gurney Maxin, M.D. General, Bariatric, & Minimally Invasive Surgery [facility] [place]  02/23/2021 11:33 AM

## 2021-02-23 NOTE — Anesthesia Postprocedure Evaluation (Signed)
Anesthesia Post Note  Patient: [name]  Procedure(s) Performed: OPEN HERNIA REPAIR UMBILICAL ADULT WITH MESH (Abdomen)     Patient location during evaluation: PACU Anesthesia Type: General Level of consciousness: awake and alert, oriented and patient cooperative Pain management: pain level controlled Vital Signs Assessment: post-procedure vital signs reviewed and stable Respiratory status: spontaneous breathing, nonlabored ventilation and respiratory function stable Cardiovascular status: blood pressure returned to baseline and stable Postop Assessment: no apparent nausea or vomiting Anesthetic complications: no   No notable events documented.  Last Vitals:  Vitals:   02/23/21 1300 02/23/21 1315  BP: 126/79 (!) 145/94  Pulse: (!) 56 65  Resp: 10 12  Temp:  (!) 36.4 C  SpO2: 92% 95%    Last Pain:  Vitals:   02/23/21 1300  TempSrc:   PainSc: [place]

## 2021-02-23 NOTE — Transfer of Care (Signed)
Immediate Anesthesia Transfer of Care Note  Patient: [name]  Procedure(s) Performed: OPEN HERNIA REPAIR UMBILICAL ADULT WITH MESH (Abdomen)  Patient Location: PACU  Anesthesia Type:General  Level of Consciousness: awake and alert   Airway & Oxygen Therapy: Patient Spontanous Breathing and Patient connected to face mask oxygen  Post-op Assessment: Report given to RN and Post -op Vital signs reviewed and stable  Post vital signs: Reviewed and stable  Last Vitals:  Vitals Value Taken Time  BP 144/98 02/23/21 1149  Temp    Pulse 69 02/23/21 1150  Resp 15 02/23/21 1150  SpO2 100 % 02/23/21 1150  Vitals shown include unvalidated device data.  Last Pain:  Vitals:   02/23/21 0939  TempSrc: Oral  PainSc: 2          Complications: No notable events documented.

## 2021-02-23 NOTE — H&P (Signed)
Chief Complaint: Umbilical Hernia   History of Present Illness: [name] is a 65 y.o. male who is seen today for umbilical hernia. He continues to have pain with activity. It has not gotten worse. He denies nausea or vomiting. He does have chronic constipation.  Review of Systems: A complete review of systems was obtained from the patient. I have reviewed this information and discussed as appropriate with the patient. See HPI as well for other ROS.  Review of Systems  Constitutional: Negative.  HENT: Negative.  Eyes: Negative.  Respiratory: Negative.  Cardiovascular: Negative.  Gastrointestinal: Positive for abdominal pain.  Genitourinary: Negative.  Musculoskeletal: Negative.  Skin: Negative.  Neurological: Negative.  Endo/Heme/Allergies: Negative.  Psychiatric/Behavioral: Negative.    Medical History: Past Medical History:  Diagnosis Date   Arthritis   Asthma, unspecified asthma severity, unspecified whether complicated, unspecified whether persistent   Diabetes mellitus without complication (CMS-HCC)   There is no problem list on file for this patient.  Past Surgical History:  Procedure Laterality Date   Colonoscopy with Propofol 11/02/2014  Dr. Mellody Memos    Allergies  Allergen Reactions   Hydromorphone Itching   Current Outpatient Medications on File Prior to Visit  Medication Sig Dispense Refill   amLODIPine (NORVASC) 10 MG tablet amlodipine 10 mg tablet TAKE 1 TABLET EVERY DAY 90 DAYS   hydroCHLOROthiazide (HYDRODIURIL) 25 MG tablet   LINZESS 145 mcg capsule Take 145 mcg by mouth once daily as needed   losartan (COZAAR) 100 MG tablet losartan 100 mg tablet TAKE 1 TABLET BY MOUTH EVERY DAY FOR 90 DAYS   metFORMIN (GLUCOPHAGE-XR) 500 MG XR tablet metformin ER 500 mg tablet,extended release 24 hr TAKE 1 TABLET BY MOUTH EVERY DAY WITH EVENING MEAL FOR 90 DAYS   metoprolol tartrate (LOPRESSOR) 100 MG tablet metoprolol tartrate 100 mg tablet TAKE 2 TABLET  EVERY DAY WITH FOOD ORALLY ONCE A DAY 90 DAYS   omeprazole (PRILOSEC) 20 MG DR capsule omeprazole 20 mg capsule,delayed release 1 CAPSULE 30 MINUTES BEFORE MORNING MEAL BY MOUTH 90 DAYS   oxyCODONE (ROXICODONE) 15 MG immediate release tablet TAKE 1 TABLET BY MOUTH FOUR TIMES A DAY AS NEEDED (Patient not taking: Reported on 01/18/2021)   pregabalin (LYRICA) 25 MG capsule Take 25 mg by mouth nightly   simvastatin (ZOCOR) 20 MG tablet simvastatin 20 mg tablet 1 TABLET IN THE EVENING BY MOUTH 90 DAYS   No current facility-administered medications on file prior to visit.   History reviewed. No pertinent family history.   Social History   Tobacco Use  Smoking Status Smoker, Current Status Unknown   Packs/day: 1.00  Smokeless Tobacco Never Used    Social History   Socioeconomic History   Marital status: Married  Tobacco Use   Smoking status: Smoker, Current Status Unknown  Packs/day: 1.00   Smokeless tobacco: Never Used  Vaping Use   Vaping Use: Never used  Substance and Sexual Activity   Alcohol use: Yes   Drug use: Defer   Sexual activity: Defer   Objective:   Vitals:  01/18/21 0908  BP: (!) 152/84  Pulse: 64  Temp: 36.7 C (98.1 F)  SpO2: 97%  Weight: 93.1 kg (205 lb 3.2 oz)  Height: 180.3 cm (5\' 11" )   Body mass index is 28.62 kg/m.  Physical Exam Constitutional:  Appearance: Normal appearance.  HENT:  Head: Normocephalic and atraumatic.  Pulmonary:  Effort: Pulmonary effort is normal.  Abdominal:  Comments: Umbilical hernia and small supraumbilical hernia  Musculoskeletal:  General: Normal range of motion.  Cervical back: Normal range of motion.  Neurological:  General: No focal deficit present.  Mental Status: He is alert and oriented to person, place, and time. Mental status is at baseline.  Psychiatric:  Mood and Affect: Mood normal.  Behavior: Behavior normal.  Thought Content: Thought content normal.   Labs, Imaging and Diagnostic Testing:  CT  scan images reviewed showing small umbilical hernia containing fatty tissue  Assessment and Plan:  Diagnoses and all orders for this visit:  Umbilical hernia without obstruction or gangrene  The patient has a symptomatic reducible hernia. We discussed the etiology of his hernia, the risk of it enlarging, incarceration, obstruction, strangulation, and that it is unlikely to get smaller or better on its own. We discussed operative options of laparoscopic vs open repair with mesh including the risks of recurrence, injury to intestines or abdominal organs, or chronic pain associated with mesh. We decided to proceed with open umbilical hernia repair with mesh as outpatient.

## 2021-02-23 NOTE — Anesthesia Preprocedure Evaluation (Addendum)
Anesthesia Evaluation  Patient identified by MRN, date of birth, ID band Patient awake    Reviewed: Allergy & Precautions, NPO status , Patient's Chart, lab work & pertinent test results, reviewed documented beta blocker date and time   History of Anesthesia Complications Negative for: history of anesthetic complications  Airway Mallampati: II  TM Distance: >3 FB Neck ROM: Full    Dental  (+) Edentulous Upper, Dental Advisory Given, Missing, Poor Dentition,    Pulmonary asthma (well controlled per pt, last used rescue inhaler a few days ago) , Current Smoker,  Current smoker, 43 pack year history  Current 1ppd, previously 3ppd   Pulmonary exam normal breath sounds clear to auscultation       Cardiovascular hypertension, Pt. on medications and Pt. on home beta blockers Normal cardiovascular exam Rhythm:Regular Rate:Normal     Neuro/Psych negative neurological ROS  negative psych ROS   GI/Hepatic Neg liver ROS, GERD  Controlled,  Endo/Other  negative endocrine ROS  Renal/GU negative Renal ROS  negative genitourinary   Musculoskeletal  (+) Arthritis , Osteoarthritis,    Abdominal   Peds  Hematology negative hematology ROS (+) hct 47.9   Anesthesia Other Findings Umbilical hernia   Reproductive/Obstetrics negative OB ROS                            Anesthesia Physical Anesthesia Plan  ASA: 3  Anesthesia Plan: General   Post-op Pain Management:    Induction: Intravenous  PONV Risk Score and Plan: 2 and Ondansetron, Dexamethasone, Midazolam and Treatment may vary due to age or medical condition  Airway Management Planned: Oral ETT  Additional Equipment: None  Intra-op Plan:   Post-operative Plan: Extubation in OR  Informed Consent: I have reviewed the patients History and Physical, chart, labs and discussed the procedure including the risks, benefits and alternatives for the  proposed anesthesia with the patient or authorized representative who has indicated his/her understanding and acceptance.     Dental advisory given  Plan Discussed with: CRNA  Anesthesia Plan Comments:        Anesthesia Quick Evaluation

## 2021-02-24 ENCOUNTER — Encounter (HOSPITAL_COMMUNITY): Payer: Self-pay | Admitting: General Surgery

## 2021-03-02 DIAGNOSIS — H1045 Other chronic allergic conjunctivitis: Secondary | ICD-10-CM | POA: Diagnosis not present

## 2021-03-02 DIAGNOSIS — H35363 Drusen (degenerative) of macula, bilateral: Secondary | ICD-10-CM | POA: Diagnosis not present

## 2021-03-02 DIAGNOSIS — H2513 Age-related nuclear cataract, bilateral: Secondary | ICD-10-CM | POA: Diagnosis not present

## 2021-03-02 DIAGNOSIS — E119 Type 2 diabetes mellitus without complications: Secondary | ICD-10-CM | POA: Diagnosis not present

## 2021-03-07 DIAGNOSIS — M1611 Unilateral primary osteoarthritis, right hip: Secondary | ICD-10-CM | POA: Diagnosis not present

## 2021-03-07 DIAGNOSIS — M1711 Unilateral primary osteoarthritis, right knee: Secondary | ICD-10-CM | POA: Diagnosis not present

## 2021-03-07 DIAGNOSIS — M25551 Pain in right hip: Secondary | ICD-10-CM | POA: Diagnosis not present

## 2021-03-14 DIAGNOSIS — Z79899 Other long term (current) drug therapy: Secondary | ICD-10-CM | POA: Diagnosis not present

## 2021-03-14 DIAGNOSIS — M2351 Chronic instability of knee, right knee: Secondary | ICD-10-CM | POA: Diagnosis not present

## 2021-03-14 DIAGNOSIS — M25551 Pain in right hip: Secondary | ICD-10-CM | POA: Diagnosis not present

## 2021-03-14 DIAGNOSIS — G629 Polyneuropathy, unspecified: Secondary | ICD-10-CM | POA: Diagnosis not present

## 2021-03-14 DIAGNOSIS — G8929 Other chronic pain: Secondary | ICD-10-CM | POA: Diagnosis not present

## 2021-03-30 DIAGNOSIS — M25551 Pain in right hip: Secondary | ICD-10-CM | POA: Diagnosis not present

## 2021-04-05 DIAGNOSIS — M222X1 Patellofemoral disorders, right knee: Secondary | ICD-10-CM | POA: Diagnosis not present

## 2021-04-05 DIAGNOSIS — M25551 Pain in right hip: Secondary | ICD-10-CM | POA: Diagnosis not present

## 2021-04-13 DIAGNOSIS — M25551 Pain in right hip: Secondary | ICD-10-CM | POA: Diagnosis not present

## 2021-04-13 DIAGNOSIS — M5416 Radiculopathy, lumbar region: Secondary | ICD-10-CM | POA: Diagnosis not present

## 2021-04-13 DIAGNOSIS — M2351 Chronic instability of knee, right knee: Secondary | ICD-10-CM | POA: Diagnosis not present

## 2021-04-13 DIAGNOSIS — G8929 Other chronic pain: Secondary | ICD-10-CM | POA: Diagnosis not present

## 2021-04-13 DIAGNOSIS — Z79899 Other long term (current) drug therapy: Secondary | ICD-10-CM | POA: Diagnosis not present

## 2021-05-10 DIAGNOSIS — M25551 Pain in right hip: Secondary | ICD-10-CM | POA: Diagnosis not present

## 2021-05-10 DIAGNOSIS — G8929 Other chronic pain: Secondary | ICD-10-CM | POA: Diagnosis not present

## 2021-05-10 DIAGNOSIS — Z79899 Other long term (current) drug therapy: Secondary | ICD-10-CM | POA: Diagnosis not present

## 2021-05-10 DIAGNOSIS — E559 Vitamin D deficiency, unspecified: Secondary | ICD-10-CM | POA: Diagnosis not present

## 2021-05-10 DIAGNOSIS — M5416 Radiculopathy, lumbar region: Secondary | ICD-10-CM | POA: Diagnosis not present

## 2021-05-10 DIAGNOSIS — M2351 Chronic instability of knee, right knee: Secondary | ICD-10-CM | POA: Diagnosis not present

## 2021-05-30 DIAGNOSIS — H903 Sensorineural hearing loss, bilateral: Secondary | ICD-10-CM | POA: Diagnosis not present

## 2021-06-07 DIAGNOSIS — G8929 Other chronic pain: Secondary | ICD-10-CM | POA: Diagnosis not present

## 2021-06-07 DIAGNOSIS — M25551 Pain in right hip: Secondary | ICD-10-CM | POA: Diagnosis not present

## 2021-06-07 DIAGNOSIS — M5416 Radiculopathy, lumbar region: Secondary | ICD-10-CM | POA: Diagnosis not present

## 2021-06-07 DIAGNOSIS — Z79899 Other long term (current) drug therapy: Secondary | ICD-10-CM | POA: Diagnosis not present

## 2021-06-07 DIAGNOSIS — M2351 Chronic instability of knee, right knee: Secondary | ICD-10-CM | POA: Diagnosis not present

## 2021-07-07 DIAGNOSIS — Z79899 Other long term (current) drug therapy: Secondary | ICD-10-CM | POA: Diagnosis not present

## 2021-08-03 DIAGNOSIS — M5416 Radiculopathy, lumbar region: Secondary | ICD-10-CM | POA: Diagnosis not present

## 2021-08-03 DIAGNOSIS — M2351 Chronic instability of knee, right knee: Secondary | ICD-10-CM | POA: Diagnosis not present

## 2021-08-03 DIAGNOSIS — Z79899 Other long term (current) drug therapy: Secondary | ICD-10-CM | POA: Diagnosis not present

## 2021-08-03 DIAGNOSIS — M25551 Pain in right hip: Secondary | ICD-10-CM | POA: Diagnosis not present

## 2021-08-03 DIAGNOSIS — G8929 Other chronic pain: Secondary | ICD-10-CM | POA: Diagnosis not present

## 2021-08-12 ENCOUNTER — Telehealth: Payer: Self-pay | Admitting: *Deleted

## 2021-08-12 NOTE — Telephone Encounter (Signed)
Left a message for patient to call office to schedule 1 year Lung cancer screening scan. Left [phone] as call back number. ? ?[place], CMA ?

## 2021-08-14 NOTE — Telephone Encounter (Signed)
Returned call from patient to schedule LDCT.  No answer.  Left voicemail with call back number ? ?

## 2021-08-15 DIAGNOSIS — R0989 Other specified symptoms and signs involving the circulatory and respiratory systems: Secondary | ICD-10-CM | POA: Diagnosis not present

## 2021-08-15 DIAGNOSIS — I10 Essential (primary) hypertension: Secondary | ICD-10-CM | POA: Diagnosis not present

## 2021-08-15 DIAGNOSIS — I739 Peripheral vascular disease, unspecified: Secondary | ICD-10-CM | POA: Diagnosis not present

## 2021-08-15 DIAGNOSIS — R0602 Shortness of breath: Secondary | ICD-10-CM | POA: Diagnosis not present

## 2021-08-21 ENCOUNTER — Other Ambulatory Visit: Payer: Self-pay

## 2021-08-21 DIAGNOSIS — F17210 Nicotine dependence, cigarettes, uncomplicated: Secondary | ICD-10-CM

## 2021-08-21 DIAGNOSIS — Z87891 Personal history of nicotine dependence: Secondary | ICD-10-CM

## 2021-08-21 DIAGNOSIS — Z122 Encounter for screening for malignant neoplasm of respiratory organs: Secondary | ICD-10-CM

## 2021-08-31 ENCOUNTER — Ambulatory Visit
Admission: RE | Admit: 2021-08-31 | Discharge: 2021-08-31 | Disposition: A | Discharge status: Home / Self Care | Payer: Medicare Other | Source: Ambulatory Visit

## 2021-08-31 DIAGNOSIS — F17210 Nicotine dependence, cigarettes, uncomplicated: Secondary | ICD-10-CM | POA: Diagnosis not present

## 2021-08-31 DIAGNOSIS — Z87891 Personal history of nicotine dependence: Secondary | ICD-10-CM

## 2021-08-31 DIAGNOSIS — Z122 Encounter for screening for malignant neoplasm of respiratory organs: Secondary | ICD-10-CM

## 2021-09-04 ENCOUNTER — Other Ambulatory Visit: Payer: Self-pay

## 2021-09-04 DIAGNOSIS — Z87891 Personal history of nicotine dependence: Secondary | ICD-10-CM

## 2021-09-04 DIAGNOSIS — F17210 Nicotine dependence, cigarettes, uncomplicated: Secondary | ICD-10-CM

## 2021-09-04 DIAGNOSIS — Z122 Encounter for screening for malignant neoplasm of respiratory organs: Secondary | ICD-10-CM

## 2021-09-06 DIAGNOSIS — M546 Pain in thoracic spine: Secondary | ICD-10-CM | POA: Diagnosis not present

## 2021-09-06 DIAGNOSIS — M25551 Pain in right hip: Secondary | ICD-10-CM | POA: Diagnosis not present

## 2021-09-06 DIAGNOSIS — M542 Cervicalgia: Secondary | ICD-10-CM | POA: Diagnosis not present

## 2021-09-06 DIAGNOSIS — M5416 Radiculopathy, lumbar region: Secondary | ICD-10-CM | POA: Diagnosis not present

## 2021-09-11 DIAGNOSIS — E1169 Type 2 diabetes mellitus with other specified complication: Secondary | ICD-10-CM | POA: Diagnosis not present

## 2021-09-11 DIAGNOSIS — E7800 Pure hypercholesterolemia, unspecified: Secondary | ICD-10-CM | POA: Diagnosis not present

## 2021-09-11 DIAGNOSIS — Z0000 Encounter for general adult medical examination without abnormal findings: Secondary | ICD-10-CM | POA: Diagnosis not present

## 2021-09-11 DIAGNOSIS — I10 Essential (primary) hypertension: Secondary | ICD-10-CM | POA: Diagnosis not present

## 2021-09-11 DIAGNOSIS — Z125 Encounter for screening for malignant neoplasm of prostate: Secondary | ICD-10-CM | POA: Diagnosis not present

## 2021-09-12 DIAGNOSIS — I10 Essential (primary) hypertension: Secondary | ICD-10-CM | POA: Diagnosis not present

## 2021-09-13 ENCOUNTER — Other Ambulatory Visit: Payer: Self-pay | Admitting: Family Medicine

## 2021-09-13 DIAGNOSIS — F17210 Nicotine dependence, cigarettes, uncomplicated: Secondary | ICD-10-CM

## 2021-09-14 DIAGNOSIS — R0602 Shortness of breath: Secondary | ICD-10-CM | POA: Diagnosis not present

## 2021-09-14 DIAGNOSIS — I10 Essential (primary) hypertension: Secondary | ICD-10-CM | POA: Diagnosis not present

## 2021-09-22 DIAGNOSIS — I10 Essential (primary) hypertension: Secondary | ICD-10-CM | POA: Diagnosis not present

## 2021-09-22 DIAGNOSIS — R0602 Shortness of breath: Secondary | ICD-10-CM | POA: Diagnosis not present

## 2021-09-22 DIAGNOSIS — R221 Localized swelling, mass and lump, neck: Secondary | ICD-10-CM | POA: Diagnosis not present

## 2021-09-22 DIAGNOSIS — I739 Peripheral vascular disease, unspecified: Secondary | ICD-10-CM | POA: Diagnosis not present

## 2021-09-26 ENCOUNTER — Ambulatory Visit
Admission: RE | Admit: 2021-09-26 | Discharge: 2021-09-26 | Disposition: A | Discharge status: Home / Self Care | Payer: Medicare Other | Source: Ambulatory Visit | Attending: Family Medicine | Admitting: Family Medicine

## 2021-09-26 DIAGNOSIS — F17210 Nicotine dependence, cigarettes, uncomplicated: Secondary | ICD-10-CM

## 2021-09-26 DIAGNOSIS — Z87891 Personal history of nicotine dependence: Secondary | ICD-10-CM | POA: Diagnosis not present

## 2021-09-26 DIAGNOSIS — Z136 Encounter for screening for cardiovascular disorders: Secondary | ICD-10-CM | POA: Diagnosis not present

## 2021-09-28 DIAGNOSIS — R221 Localized swelling, mass and lump, neck: Secondary | ICD-10-CM | POA: Diagnosis not present

## 2021-10-02 DIAGNOSIS — M546 Pain in thoracic spine: Secondary | ICD-10-CM | POA: Diagnosis not present

## 2021-10-02 DIAGNOSIS — M542 Cervicalgia: Secondary | ICD-10-CM | POA: Diagnosis not present

## 2021-10-02 DIAGNOSIS — M25551 Pain in right hip: Secondary | ICD-10-CM | POA: Diagnosis not present

## 2021-10-02 DIAGNOSIS — Z79899 Other long term (current) drug therapy: Secondary | ICD-10-CM | POA: Diagnosis not present

## 2021-10-02 DIAGNOSIS — M5416 Radiculopathy, lumbar region: Secondary | ICD-10-CM | POA: Diagnosis not present

## 2021-11-03 DIAGNOSIS — Z79899 Other long term (current) drug therapy: Secondary | ICD-10-CM | POA: Diagnosis not present

## 2021-11-03 DIAGNOSIS — M546 Pain in thoracic spine: Secondary | ICD-10-CM | POA: Diagnosis not present

## 2021-11-03 DIAGNOSIS — R4189 Other symptoms and signs involving cognitive functions and awareness: Secondary | ICD-10-CM | POA: Diagnosis not present

## 2021-11-03 DIAGNOSIS — M5416 Radiculopathy, lumbar region: Secondary | ICD-10-CM | POA: Diagnosis not present

## 2021-11-03 DIAGNOSIS — M542 Cervicalgia: Secondary | ICD-10-CM | POA: Diagnosis not present

## 2021-11-06 DIAGNOSIS — I10 Essential (primary) hypertension: Secondary | ICD-10-CM | POA: Diagnosis not present

## 2021-11-06 DIAGNOSIS — E7800 Pure hypercholesterolemia, unspecified: Secondary | ICD-10-CM | POA: Diagnosis not present

## 2021-11-06 DIAGNOSIS — I2510 Atherosclerotic heart disease of native coronary artery without angina pectoris: Secondary | ICD-10-CM | POA: Diagnosis not present

## 2021-11-06 DIAGNOSIS — E1169 Type 2 diabetes mellitus with other specified complication: Secondary | ICD-10-CM | POA: Diagnosis not present

## 2021-11-29 DIAGNOSIS — Z6826 Body mass index (BMI) 26.0-26.9, adult: Secondary | ICD-10-CM | POA: Diagnosis not present

## 2021-11-29 DIAGNOSIS — M5416 Radiculopathy, lumbar region: Secondary | ICD-10-CM | POA: Diagnosis not present

## 2021-11-29 DIAGNOSIS — M25521 Pain in right elbow: Secondary | ICD-10-CM | POA: Diagnosis not present

## 2021-11-29 DIAGNOSIS — Z79899 Other long term (current) drug therapy: Secondary | ICD-10-CM | POA: Diagnosis not present

## 2021-12-07 DIAGNOSIS — E1169 Type 2 diabetes mellitus with other specified complication: Secondary | ICD-10-CM | POA: Diagnosis not present

## 2021-12-07 DIAGNOSIS — N1831 Chronic kidney disease, stage 3a: Secondary | ICD-10-CM | POA: Diagnosis not present

## 2021-12-07 DIAGNOSIS — I10 Essential (primary) hypertension: Secondary | ICD-10-CM | POA: Diagnosis not present

## 2021-12-07 DIAGNOSIS — E7800 Pure hypercholesterolemia, unspecified: Secondary | ICD-10-CM | POA: Diagnosis not present

## 2021-12-28 DIAGNOSIS — M546 Pain in thoracic spine: Secondary | ICD-10-CM | POA: Diagnosis not present

## 2021-12-28 DIAGNOSIS — Z6826 Body mass index (BMI) 26.0-26.9, adult: Secondary | ICD-10-CM | POA: Diagnosis not present

## 2021-12-28 DIAGNOSIS — M5416 Radiculopathy, lumbar region: Secondary | ICD-10-CM | POA: Diagnosis not present

## 2021-12-28 DIAGNOSIS — Z79899 Other long term (current) drug therapy: Secondary | ICD-10-CM | POA: Diagnosis not present

## 2021-12-28 DIAGNOSIS — M542 Cervicalgia: Secondary | ICD-10-CM | POA: Diagnosis not present

## 2022-01-25 ENCOUNTER — Other Ambulatory Visit: Payer: Self-pay | Admitting: Otolaryngology

## 2022-01-25 ENCOUNTER — Other Ambulatory Visit (HOSPITAL_COMMUNITY): Payer: Self-pay | Admitting: Otolaryngology

## 2022-01-25 DIAGNOSIS — F1110 Opioid abuse, uncomplicated: Secondary | ICD-10-CM | POA: Diagnosis not present

## 2022-01-25 DIAGNOSIS — G894 Chronic pain syndrome: Secondary | ICD-10-CM | POA: Insufficient documentation

## 2022-01-25 DIAGNOSIS — Z720 Tobacco use: Secondary | ICD-10-CM | POA: Diagnosis not present

## 2022-01-25 DIAGNOSIS — R221 Localized swelling, mass and lump, neck: Secondary | ICD-10-CM | POA: Insufficient documentation

## 2022-01-25 HISTORY — DX: Chronic pain syndrome: G89.4

## 2022-01-25 HISTORY — DX: Localized swelling, mass and lump, neck: R22.1

## 2022-01-26 DIAGNOSIS — I739 Peripheral vascular disease, unspecified: Secondary | ICD-10-CM | POA: Diagnosis not present

## 2022-01-26 DIAGNOSIS — E119 Type 2 diabetes mellitus without complications: Secondary | ICD-10-CM | POA: Diagnosis not present

## 2022-01-26 DIAGNOSIS — R221 Localized swelling, mass and lump, neck: Secondary | ICD-10-CM | POA: Diagnosis not present

## 2022-01-26 DIAGNOSIS — M5416 Radiculopathy, lumbar region: Secondary | ICD-10-CM | POA: Diagnosis not present

## 2022-01-26 DIAGNOSIS — Z79899 Other long term (current) drug therapy: Secondary | ICD-10-CM | POA: Diagnosis not present

## 2022-02-06 ENCOUNTER — Ambulatory Visit (HOSPITAL_COMMUNITY)
Admission: RE | Admit: 2022-02-06 | Discharge: 2022-02-06 | Disposition: A | Discharge status: Home / Self Care | Payer: Medicare Other | Source: Ambulatory Visit | Attending: Otolaryngology | Admitting: Otolaryngology

## 2022-02-06 DIAGNOSIS — R221 Localized swelling, mass and lump, neck: Secondary | ICD-10-CM | POA: Insufficient documentation

## 2022-02-06 LAB — POCT I-STAT CREATININE: Creatinine, Ser: 1.3 mg/dL — ABNORMAL HIGH (ref 0.61–1.24)

## 2022-02-06 MED ORDER — IOHEXOL 300 MG/ML  SOLN
75.0000 mL | Freq: Once | INTRAMUSCULAR | Status: AC | PRN
  Administered 2022-02-06: 75 mL via INTRAVENOUS

## 2022-02-13 NOTE — Progress Notes (Unsigned)
Mir, Jon Libra, MD  [name] Approved for US guided core biopsy of left parotid mass.   Mir

## 2022-02-26 ENCOUNTER — Other Ambulatory Visit: Payer: Self-pay | Admitting: Student

## 2022-02-26 DIAGNOSIS — M5416 Radiculopathy, lumbar region: Secondary | ICD-10-CM | POA: Diagnosis not present

## 2022-02-26 DIAGNOSIS — M129 Arthropathy, unspecified: Secondary | ICD-10-CM | POA: Diagnosis not present

## 2022-02-26 DIAGNOSIS — R5383 Other fatigue: Secondary | ICD-10-CM | POA: Diagnosis not present

## 2022-02-26 DIAGNOSIS — Z79899 Other long term (current) drug therapy: Secondary | ICD-10-CM | POA: Diagnosis not present

## 2022-02-26 DIAGNOSIS — E7800 Pure hypercholesterolemia, unspecified: Secondary | ICD-10-CM | POA: Diagnosis not present

## 2022-02-26 DIAGNOSIS — E119 Type 2 diabetes mellitus without complications: Secondary | ICD-10-CM | POA: Diagnosis not present

## 2022-02-27 ENCOUNTER — Other Ambulatory Visit: Payer: Self-pay

## 2022-02-27 ENCOUNTER — Ambulatory Visit (HOSPITAL_COMMUNITY)
Admission: RE | Admit: 2022-02-27 | Discharge: 2022-02-27 | Disposition: A | Discharge status: Home / Self Care | Payer: Medicare Other | Source: Ambulatory Visit | Attending: Otolaryngology | Admitting: Otolaryngology

## 2022-02-27 ENCOUNTER — Encounter (HOSPITAL_COMMUNITY): Payer: Self-pay

## 2022-02-27 DIAGNOSIS — R221 Localized swelling, mass and lump, neck: Secondary | ICD-10-CM | POA: Diagnosis not present

## 2022-02-27 DIAGNOSIS — M549 Dorsalgia, unspecified: Secondary | ICD-10-CM | POA: Insufficient documentation

## 2022-02-27 DIAGNOSIS — K118 Other diseases of salivary glands: Secondary | ICD-10-CM | POA: Diagnosis not present

## 2022-02-27 DIAGNOSIS — Z79899 Other long term (current) drug therapy: Secondary | ICD-10-CM | POA: Insufficient documentation

## 2022-02-27 DIAGNOSIS — K116 Mucocele of salivary gland: Secondary | ICD-10-CM | POA: Diagnosis not present

## 2022-02-27 DIAGNOSIS — I700 Atherosclerosis of aorta: Secondary | ICD-10-CM | POA: Diagnosis not present

## 2022-02-27 DIAGNOSIS — R222 Localized swelling, mass and lump, trunk: Secondary | ICD-10-CM | POA: Insufficient documentation

## 2022-02-27 DIAGNOSIS — I6523 Occlusion and stenosis of bilateral carotid arteries: Secondary | ICD-10-CM | POA: Diagnosis not present

## 2022-02-27 LAB — GLUCOSE, CAPILLARY: Glucose-Capillary: 113 mg/dL — ABNORMAL HIGH (ref 70–99)

## 2022-02-27 MED ORDER — LIDOCAINE HCL (PF) 1 % IJ SOLN
INTRAMUSCULAR | Status: AC
  Filled 2022-02-27: qty 30

## 2022-02-27 MED ORDER — FENTANYL CITRATE (PF) 100 MCG/2ML IJ SOLN
INTRAMUSCULAR | Status: AC
  Filled 2022-02-27: qty 2

## 2022-02-27 MED ORDER — FENTANYL CITRATE (PF) 100 MCG/2ML IJ SOLN
INTRAMUSCULAR | Status: AC | PRN
  Administered 2022-02-27 (×2): 50 ug via INTRAVENOUS

## 2022-02-27 MED ORDER — MIDAZOLAM HCL 2 MG/2ML IJ SOLN
INTRAMUSCULAR | Status: AC
  Filled 2022-02-27: qty 2

## 2022-02-27 MED ORDER — MIDAZOLAM HCL 2 MG/2ML IJ SOLN
INTRAMUSCULAR | Status: AC | PRN
  Administered 2022-02-27 (×2): 1 mg via INTRAVENOUS

## 2022-02-27 MED ORDER — SODIUM CHLORIDE 0.9 % IV SOLN: INTRAVENOUS | Status: DC

## 2022-02-27 NOTE — Procedures (Signed)
Interventional Radiology Procedure Note  Procedure: [place] core bx left parotid mass    Complications: None  Estimated Blood Loss:  min  Findings: 18 g fragmented cores in saline    Tamera Punt, MD
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
## 2022-02-28 LAB — SURGICAL PATHOLOGY

## 2022-03-14 DIAGNOSIS — I10 Essential (primary) hypertension: Secondary | ICD-10-CM | POA: Diagnosis not present

## 2022-03-14 DIAGNOSIS — N1831 Chronic kidney disease, stage 3a: Secondary | ICD-10-CM | POA: Diagnosis not present

## 2022-03-14 DIAGNOSIS — E7800 Pure hypercholesterolemia, unspecified: Secondary | ICD-10-CM | POA: Diagnosis not present

## 2022-03-14 DIAGNOSIS — E1122 Type 2 diabetes mellitus with diabetic chronic kidney disease: Secondary | ICD-10-CM | POA: Diagnosis not present

## 2022-03-22 DIAGNOSIS — D119 Benign neoplasm of major salivary gland, unspecified: Secondary | ICD-10-CM

## 2022-03-22 DIAGNOSIS — Z720 Tobacco use: Secondary | ICD-10-CM | POA: Diagnosis not present

## 2022-03-22 DIAGNOSIS — M542 Cervicalgia: Secondary | ICD-10-CM | POA: Diagnosis not present

## 2022-03-22 HISTORY — DX: Benign neoplasm of major salivary gland, unspecified: D11.9

## 2022-03-27 DIAGNOSIS — Z6827 Body mass index (BMI) 27.0-27.9, adult: Secondary | ICD-10-CM | POA: Diagnosis not present

## 2022-03-27 DIAGNOSIS — K5900 Constipation, unspecified: Secondary | ICD-10-CM | POA: Diagnosis not present

## 2022-03-27 DIAGNOSIS — M5416 Radiculopathy, lumbar region: Secondary | ICD-10-CM | POA: Diagnosis not present

## 2022-03-27 DIAGNOSIS — Z79899 Other long term (current) drug therapy: Secondary | ICD-10-CM | POA: Diagnosis not present

## 2022-03-29 ENCOUNTER — Other Ambulatory Visit: Payer: Self-pay

## 2022-03-29 DIAGNOSIS — R221 Localized swelling, mass and lump, neck: Secondary | ICD-10-CM | POA: Diagnosis not present

## 2022-03-29 DIAGNOSIS — D110 Benign neoplasm of parotid gland: Secondary | ICD-10-CM | POA: Diagnosis not present

## 2022-04-27 DIAGNOSIS — F17200 Nicotine dependence, unspecified, uncomplicated: Secondary | ICD-10-CM | POA: Diagnosis not present

## 2022-04-27 DIAGNOSIS — M5416 Radiculopathy, lumbar region: Secondary | ICD-10-CM | POA: Diagnosis not present

## 2022-04-27 DIAGNOSIS — Z6827 Body mass index (BMI) 27.0-27.9, adult: Secondary | ICD-10-CM | POA: Diagnosis not present

## 2022-04-27 DIAGNOSIS — Z79899 Other long term (current) drug therapy: Secondary | ICD-10-CM | POA: Diagnosis not present

## 2022-05-28 DIAGNOSIS — F17210 Nicotine dependence, cigarettes, uncomplicated: Secondary | ICD-10-CM | POA: Diagnosis not present

## 2022-05-28 DIAGNOSIS — E119 Type 2 diabetes mellitus without complications: Secondary | ICD-10-CM | POA: Diagnosis not present

## 2022-05-28 DIAGNOSIS — F17200 Nicotine dependence, unspecified, uncomplicated: Secondary | ICD-10-CM | POA: Diagnosis not present

## 2022-05-28 DIAGNOSIS — Z6827 Body mass index (BMI) 27.0-27.9, adult: Secondary | ICD-10-CM | POA: Diagnosis not present

## 2022-05-28 DIAGNOSIS — M5416 Radiculopathy, lumbar region: Secondary | ICD-10-CM | POA: Diagnosis not present

## 2022-05-28 DIAGNOSIS — Z79899 Other long term (current) drug therapy: Secondary | ICD-10-CM | POA: Diagnosis not present

## 2022-06-13 DIAGNOSIS — I10 Essential (primary) hypertension: Secondary | ICD-10-CM | POA: Diagnosis not present

## 2022-06-13 DIAGNOSIS — I2510 Atherosclerotic heart disease of native coronary artery without angina pectoris: Secondary | ICD-10-CM | POA: Diagnosis not present

## 2022-06-13 DIAGNOSIS — E1122 Type 2 diabetes mellitus with diabetic chronic kidney disease: Secondary | ICD-10-CM | POA: Diagnosis not present

## 2022-06-13 DIAGNOSIS — E7800 Pure hypercholesterolemia, unspecified: Secondary | ICD-10-CM | POA: Diagnosis not present

## 2022-06-26 DIAGNOSIS — M25572 Pain in left ankle and joints of left foot: Secondary | ICD-10-CM | POA: Diagnosis not present

## 2022-06-26 DIAGNOSIS — M5416 Radiculopathy, lumbar region: Secondary | ICD-10-CM | POA: Diagnosis not present

## 2022-06-26 DIAGNOSIS — E119 Type 2 diabetes mellitus without complications: Secondary | ICD-10-CM | POA: Diagnosis not present

## 2022-06-26 DIAGNOSIS — G8929 Other chronic pain: Secondary | ICD-10-CM | POA: Diagnosis not present

## 2022-06-26 DIAGNOSIS — F17210 Nicotine dependence, cigarettes, uncomplicated: Secondary | ICD-10-CM | POA: Diagnosis not present

## 2022-06-26 DIAGNOSIS — F17200 Nicotine dependence, unspecified, uncomplicated: Secondary | ICD-10-CM | POA: Diagnosis not present

## 2022-06-26 DIAGNOSIS — Z6827 Body mass index (BMI) 27.0-27.9, adult: Secondary | ICD-10-CM | POA: Diagnosis not present

## 2022-06-26 DIAGNOSIS — Z79899 Other long term (current) drug therapy: Secondary | ICD-10-CM | POA: Diagnosis not present

## 2022-07-02 DIAGNOSIS — E7800 Pure hypercholesterolemia, unspecified: Secondary | ICD-10-CM | POA: Diagnosis not present

## 2022-07-02 DIAGNOSIS — I10 Essential (primary) hypertension: Secondary | ICD-10-CM | POA: Diagnosis not present

## 2022-07-02 DIAGNOSIS — E1122 Type 2 diabetes mellitus with diabetic chronic kidney disease: Secondary | ICD-10-CM | POA: Diagnosis not present

## 2022-07-02 DIAGNOSIS — I2510 Atherosclerotic heart disease of native coronary artery without angina pectoris: Secondary | ICD-10-CM | POA: Diagnosis not present

## 2022-07-19 DIAGNOSIS — N1831 Chronic kidney disease, stage 3a: Secondary | ICD-10-CM | POA: Diagnosis not present

## 2022-07-19 DIAGNOSIS — E7800 Pure hypercholesterolemia, unspecified: Secondary | ICD-10-CM | POA: Diagnosis not present

## 2022-07-19 DIAGNOSIS — E1122 Type 2 diabetes mellitus with diabetic chronic kidney disease: Secondary | ICD-10-CM | POA: Diagnosis not present

## 2022-07-19 DIAGNOSIS — I10 Essential (primary) hypertension: Secondary | ICD-10-CM | POA: Diagnosis not present

## 2022-07-24 DIAGNOSIS — E119 Type 2 diabetes mellitus without complications: Secondary | ICD-10-CM | POA: Diagnosis not present

## 2022-07-24 DIAGNOSIS — Z79899 Other long term (current) drug therapy: Secondary | ICD-10-CM | POA: Diagnosis not present

## 2022-07-24 DIAGNOSIS — M5416 Radiculopathy, lumbar region: Secondary | ICD-10-CM | POA: Diagnosis not present

## 2022-07-24 DIAGNOSIS — Z6826 Body mass index (BMI) 26.0-26.9, adult: Secondary | ICD-10-CM | POA: Diagnosis not present

## 2022-08-24 DIAGNOSIS — Z6827 Body mass index (BMI) 27.0-27.9, adult: Secondary | ICD-10-CM | POA: Diagnosis not present

## 2022-08-24 DIAGNOSIS — M5416 Radiculopathy, lumbar region: Secondary | ICD-10-CM | POA: Diagnosis not present

## 2022-08-24 DIAGNOSIS — G629 Polyneuropathy, unspecified: Secondary | ICD-10-CM | POA: Diagnosis not present

## 2022-08-24 DIAGNOSIS — Z79899 Other long term (current) drug therapy: Secondary | ICD-10-CM | POA: Diagnosis not present

## 2022-08-24 DIAGNOSIS — E119 Type 2 diabetes mellitus without complications: Secondary | ICD-10-CM | POA: Diagnosis not present

## 2022-08-24 DIAGNOSIS — Z6826 Body mass index (BMI) 26.0-26.9, adult: Secondary | ICD-10-CM | POA: Diagnosis not present

## 2022-08-27 DIAGNOSIS — H35033 Hypertensive retinopathy, bilateral: Secondary | ICD-10-CM | POA: Diagnosis not present

## 2022-08-27 DIAGNOSIS — H02831 Dermatochalasis of right upper eyelid: Secondary | ICD-10-CM | POA: Diagnosis not present

## 2022-08-27 DIAGNOSIS — E119 Type 2 diabetes mellitus without complications: Secondary | ICD-10-CM | POA: Diagnosis not present

## 2022-08-27 DIAGNOSIS — H25813 Combined forms of age-related cataract, bilateral: Secondary | ICD-10-CM | POA: Diagnosis not present

## 2022-08-27 DIAGNOSIS — H524 Presbyopia: Secondary | ICD-10-CM | POA: Diagnosis not present

## 2022-09-04 ENCOUNTER — Ambulatory Visit
Admission: RE | Admit: 2022-09-04 | Discharge: 2022-09-04 | Disposition: A | Discharge status: Home / Self Care | Payer: Medicare Other | Source: Ambulatory Visit

## 2022-09-04 DIAGNOSIS — J948 Other specified pleural conditions: Secondary | ICD-10-CM | POA: Diagnosis not present

## 2022-09-04 DIAGNOSIS — F17210 Nicotine dependence, cigarettes, uncomplicated: Secondary | ICD-10-CM

## 2022-09-04 DIAGNOSIS — Z87891 Personal history of nicotine dependence: Secondary | ICD-10-CM

## 2022-09-04 DIAGNOSIS — Z122 Encounter for screening for malignant neoplasm of respiratory organs: Secondary | ICD-10-CM

## 2022-09-04 DIAGNOSIS — I2510 Atherosclerotic heart disease of native coronary artery without angina pectoris: Secondary | ICD-10-CM | POA: Diagnosis not present

## 2022-09-04 DIAGNOSIS — J432 Centrilobular emphysema: Secondary | ICD-10-CM | POA: Diagnosis not present

## 2022-09-07 ENCOUNTER — Other Ambulatory Visit: Payer: Self-pay | Admitting: Acute Care

## 2022-09-07 DIAGNOSIS — Z122 Encounter for screening for malignant neoplasm of respiratory organs: Secondary | ICD-10-CM

## 2022-09-07 DIAGNOSIS — F17210 Nicotine dependence, cigarettes, uncomplicated: Secondary | ICD-10-CM

## 2022-09-07 DIAGNOSIS — Z87891 Personal history of nicotine dependence: Secondary | ICD-10-CM

## 2022-09-21 DIAGNOSIS — E7800 Pure hypercholesterolemia, unspecified: Secondary | ICD-10-CM | POA: Diagnosis not present

## 2022-09-21 DIAGNOSIS — Z125 Encounter for screening for malignant neoplasm of prostate: Secondary | ICD-10-CM | POA: Diagnosis not present

## 2022-09-21 DIAGNOSIS — E1169 Type 2 diabetes mellitus with other specified complication: Secondary | ICD-10-CM | POA: Diagnosis not present

## 2022-09-21 DIAGNOSIS — J439 Emphysema, unspecified: Secondary | ICD-10-CM | POA: Diagnosis not present

## 2022-09-21 DIAGNOSIS — M5416 Radiculopathy, lumbar region: Secondary | ICD-10-CM | POA: Diagnosis not present

## 2022-09-21 DIAGNOSIS — Z6826 Body mass index (BMI) 26.0-26.9, adult: Secondary | ICD-10-CM | POA: Diagnosis not present

## 2022-09-21 DIAGNOSIS — Z79899 Other long term (current) drug therapy: Secondary | ICD-10-CM | POA: Diagnosis not present

## 2022-09-21 DIAGNOSIS — F332 Major depressive disorder, recurrent severe without psychotic features: Secondary | ICD-10-CM | POA: Diagnosis not present

## 2022-09-21 DIAGNOSIS — Z0000 Encounter for general adult medical examination without abnormal findings: Secondary | ICD-10-CM | POA: Diagnosis not present

## 2022-09-21 DIAGNOSIS — E1122 Type 2 diabetes mellitus with diabetic chronic kidney disease: Secondary | ICD-10-CM | POA: Diagnosis not present

## 2022-09-21 DIAGNOSIS — E119 Type 2 diabetes mellitus without complications: Secondary | ICD-10-CM | POA: Diagnosis not present

## 2022-09-21 DIAGNOSIS — I700 Atherosclerosis of aorta: Secondary | ICD-10-CM | POA: Diagnosis not present

## 2022-09-21 DIAGNOSIS — G629 Polyneuropathy, unspecified: Secondary | ICD-10-CM | POA: Diagnosis not present

## 2022-09-21 DIAGNOSIS — Z6827 Body mass index (BMI) 27.0-27.9, adult: Secondary | ICD-10-CM | POA: Diagnosis not present

## 2022-09-21 DIAGNOSIS — N182 Chronic kidney disease, stage 2 (mild): Secondary | ICD-10-CM | POA: Diagnosis not present

## 2022-09-21 DIAGNOSIS — I10 Essential (primary) hypertension: Secondary | ICD-10-CM | POA: Diagnosis not present

## 2022-10-13 DIAGNOSIS — E119 Type 2 diabetes mellitus without complications: Secondary | ICD-10-CM | POA: Diagnosis not present

## 2022-10-23 DIAGNOSIS — Z79899 Other long term (current) drug therapy: Secondary | ICD-10-CM | POA: Diagnosis not present

## 2022-10-23 DIAGNOSIS — R030 Elevated blood-pressure reading, without diagnosis of hypertension: Secondary | ICD-10-CM | POA: Diagnosis not present

## 2022-10-23 DIAGNOSIS — Z6827 Body mass index (BMI) 27.0-27.9, adult: Secondary | ICD-10-CM | POA: Diagnosis not present

## 2022-10-23 DIAGNOSIS — G629 Polyneuropathy, unspecified: Secondary | ICD-10-CM | POA: Diagnosis not present

## 2022-10-23 DIAGNOSIS — M5416 Radiculopathy, lumbar region: Secondary | ICD-10-CM | POA: Diagnosis not present

## 2022-10-23 DIAGNOSIS — E119 Type 2 diabetes mellitus without complications: Secondary | ICD-10-CM | POA: Diagnosis not present

## 2022-10-23 DIAGNOSIS — Z6826 Body mass index (BMI) 26.0-26.9, adult: Secondary | ICD-10-CM | POA: Diagnosis not present

## 2022-11-12 DIAGNOSIS — E119 Type 2 diabetes mellitus without complications: Secondary | ICD-10-CM | POA: Diagnosis not present

## 2022-11-22 DIAGNOSIS — G629 Polyneuropathy, unspecified: Secondary | ICD-10-CM | POA: Diagnosis not present

## 2022-11-22 DIAGNOSIS — Z6826 Body mass index (BMI) 26.0-26.9, adult: Secondary | ICD-10-CM | POA: Diagnosis not present

## 2022-11-22 DIAGNOSIS — E119 Type 2 diabetes mellitus without complications: Secondary | ICD-10-CM | POA: Diagnosis not present

## 2022-11-22 DIAGNOSIS — M5416 Radiculopathy, lumbar region: Secondary | ICD-10-CM | POA: Diagnosis not present

## 2022-11-22 DIAGNOSIS — R030 Elevated blood-pressure reading, without diagnosis of hypertension: Secondary | ICD-10-CM | POA: Diagnosis not present

## 2022-11-22 DIAGNOSIS — Z79899 Other long term (current) drug therapy: Secondary | ICD-10-CM | POA: Diagnosis not present

## 2022-11-22 DIAGNOSIS — Z6827 Body mass index (BMI) 27.0-27.9, adult: Secondary | ICD-10-CM | POA: Diagnosis not present

## 2022-12-13 DIAGNOSIS — E119 Type 2 diabetes mellitus without complications: Secondary | ICD-10-CM | POA: Diagnosis not present

## 2022-12-20 DIAGNOSIS — M5416 Radiculopathy, lumbar region: Secondary | ICD-10-CM | POA: Diagnosis not present

## 2022-12-20 DIAGNOSIS — E119 Type 2 diabetes mellitus without complications: Secondary | ICD-10-CM | POA: Diagnosis not present

## 2022-12-20 DIAGNOSIS — Z6826 Body mass index (BMI) 26.0-26.9, adult: Secondary | ICD-10-CM | POA: Diagnosis not present

## 2022-12-20 DIAGNOSIS — G629 Polyneuropathy, unspecified: Secondary | ICD-10-CM | POA: Diagnosis not present

## 2022-12-20 DIAGNOSIS — R030 Elevated blood-pressure reading, without diagnosis of hypertension: Secondary | ICD-10-CM | POA: Diagnosis not present

## 2022-12-20 DIAGNOSIS — Z79899 Other long term (current) drug therapy: Secondary | ICD-10-CM | POA: Diagnosis not present

## 2022-12-20 DIAGNOSIS — Z6827 Body mass index (BMI) 27.0-27.9, adult: Secondary | ICD-10-CM | POA: Diagnosis not present

## 2023-01-13 DIAGNOSIS — E119 Type 2 diabetes mellitus without complications: Secondary | ICD-10-CM | POA: Diagnosis not present

## 2023-01-21 DIAGNOSIS — R030 Elevated blood-pressure reading, without diagnosis of hypertension: Secondary | ICD-10-CM | POA: Diagnosis not present

## 2023-01-21 DIAGNOSIS — Z6826 Body mass index (BMI) 26.0-26.9, adult: Secondary | ICD-10-CM | POA: Diagnosis not present

## 2023-01-21 DIAGNOSIS — Z79899 Other long term (current) drug therapy: Secondary | ICD-10-CM | POA: Diagnosis not present

## 2023-01-21 DIAGNOSIS — E119 Type 2 diabetes mellitus without complications: Secondary | ICD-10-CM | POA: Diagnosis not present

## 2023-01-21 DIAGNOSIS — M549 Dorsalgia, unspecified: Secondary | ICD-10-CM | POA: Diagnosis not present

## 2023-01-21 DIAGNOSIS — M542 Cervicalgia: Secondary | ICD-10-CM | POA: Diagnosis not present

## 2023-01-21 DIAGNOSIS — G629 Polyneuropathy, unspecified: Secondary | ICD-10-CM | POA: Diagnosis not present

## 2023-01-21 DIAGNOSIS — M5416 Radiculopathy, lumbar region: Secondary | ICD-10-CM | POA: Diagnosis not present

## 2023-01-21 DIAGNOSIS — Z6827 Body mass index (BMI) 27.0-27.9, adult: Secondary | ICD-10-CM | POA: Diagnosis not present

## 2023-02-18 DIAGNOSIS — R030 Elevated blood-pressure reading, without diagnosis of hypertension: Secondary | ICD-10-CM | POA: Diagnosis not present

## 2023-02-18 DIAGNOSIS — M129 Arthropathy, unspecified: Secondary | ICD-10-CM | POA: Diagnosis not present

## 2023-02-18 DIAGNOSIS — R5383 Other fatigue: Secondary | ICD-10-CM | POA: Diagnosis not present

## 2023-02-18 DIAGNOSIS — E559 Vitamin D deficiency, unspecified: Secondary | ICD-10-CM | POA: Diagnosis not present

## 2023-02-18 DIAGNOSIS — Z6827 Body mass index (BMI) 27.0-27.9, adult: Secondary | ICD-10-CM | POA: Diagnosis not present

## 2023-02-18 DIAGNOSIS — G629 Polyneuropathy, unspecified: Secondary | ICD-10-CM | POA: Diagnosis not present

## 2023-02-18 DIAGNOSIS — E119 Type 2 diabetes mellitus without complications: Secondary | ICD-10-CM | POA: Diagnosis not present

## 2023-02-18 DIAGNOSIS — Z6826 Body mass index (BMI) 26.0-26.9, adult: Secondary | ICD-10-CM | POA: Diagnosis not present

## 2023-02-18 DIAGNOSIS — Z125 Encounter for screening for malignant neoplasm of prostate: Secondary | ICD-10-CM | POA: Diagnosis not present

## 2023-02-18 DIAGNOSIS — M542 Cervicalgia: Secondary | ICD-10-CM | POA: Diagnosis not present

## 2023-02-18 DIAGNOSIS — Z79899 Other long term (current) drug therapy: Secondary | ICD-10-CM | POA: Diagnosis not present

## 2023-02-18 DIAGNOSIS — E7800 Pure hypercholesterolemia, unspecified: Secondary | ICD-10-CM | POA: Diagnosis not present

## 2023-02-18 DIAGNOSIS — M5416 Radiculopathy, lumbar region: Secondary | ICD-10-CM | POA: Diagnosis not present

## 2023-03-19 DIAGNOSIS — M5416 Radiculopathy, lumbar region: Secondary | ICD-10-CM | POA: Diagnosis not present

## 2023-03-19 DIAGNOSIS — Z6826 Body mass index (BMI) 26.0-26.9, adult: Secondary | ICD-10-CM | POA: Diagnosis not present

## 2023-03-19 DIAGNOSIS — R030 Elevated blood-pressure reading, without diagnosis of hypertension: Secondary | ICD-10-CM | POA: Diagnosis not present

## 2023-03-19 DIAGNOSIS — G629 Polyneuropathy, unspecified: Secondary | ICD-10-CM | POA: Diagnosis not present

## 2023-03-19 DIAGNOSIS — Z79899 Other long term (current) drug therapy: Secondary | ICD-10-CM | POA: Diagnosis not present

## 2023-03-19 DIAGNOSIS — E119 Type 2 diabetes mellitus without complications: Secondary | ICD-10-CM | POA: Diagnosis not present

## 2023-03-19 DIAGNOSIS — Z6827 Body mass index (BMI) 27.0-27.9, adult: Secondary | ICD-10-CM | POA: Diagnosis not present

## 2023-03-26 DIAGNOSIS — I10 Essential (primary) hypertension: Secondary | ICD-10-CM | POA: Diagnosis not present

## 2023-03-26 DIAGNOSIS — E1122 Type 2 diabetes mellitus with diabetic chronic kidney disease: Secondary | ICD-10-CM | POA: Diagnosis not present

## 2023-03-26 DIAGNOSIS — F332 Major depressive disorder, recurrent severe without psychotic features: Secondary | ICD-10-CM | POA: Diagnosis not present

## 2023-03-26 DIAGNOSIS — E7800 Pure hypercholesterolemia, unspecified: Secondary | ICD-10-CM | POA: Diagnosis not present

## 2023-03-26 DIAGNOSIS — Z23 Encounter for immunization: Secondary | ICD-10-CM | POA: Diagnosis not present

## 2023-03-26 DIAGNOSIS — N1831 Chronic kidney disease, stage 3a: Secondary | ICD-10-CM | POA: Diagnosis not present

## 2023-04-18 DIAGNOSIS — G629 Polyneuropathy, unspecified: Secondary | ICD-10-CM | POA: Diagnosis not present

## 2023-04-18 DIAGNOSIS — Z79899 Other long term (current) drug therapy: Secondary | ICD-10-CM | POA: Diagnosis not present

## 2023-04-18 DIAGNOSIS — Z6828 Body mass index (BMI) 28.0-28.9, adult: Secondary | ICD-10-CM | POA: Diagnosis not present

## 2023-04-18 DIAGNOSIS — M5416 Radiculopathy, lumbar region: Secondary | ICD-10-CM | POA: Diagnosis not present

## 2023-04-18 DIAGNOSIS — E119 Type 2 diabetes mellitus without complications: Secondary | ICD-10-CM | POA: Diagnosis not present

## 2023-08-09 ENCOUNTER — Other Ambulatory Visit (HOSPITAL_COMMUNITY): Payer: Self-pay | Admitting: Gastroenterology

## 2023-08-09 DIAGNOSIS — C160 Malignant neoplasm of cardia: Secondary | ICD-10-CM

## 2023-08-16 ENCOUNTER — Ambulatory Visit (HOSPITAL_COMMUNITY)
Admission: RE | Admit: 2023-08-16 | Discharge: 2023-08-16 | Disposition: A | Discharge status: Home / Self Care | Source: Ambulatory Visit | Attending: Gastroenterology | Admitting: Gastroenterology

## 2023-08-16 DIAGNOSIS — C160 Malignant neoplasm of cardia: Secondary | ICD-10-CM | POA: Diagnosis present

## 2023-08-16 MED ORDER — IOHEXOL 300 MG/ML  SOLN
100.0000 mL | Freq: Once | INTRAMUSCULAR | Status: AC | PRN
  Administered 2023-08-16: 100 mL via INTRAVENOUS

## 2023-08-19 ENCOUNTER — Other Ambulatory Visit: Payer: Self-pay

## 2023-08-19 DIAGNOSIS — F17210 Nicotine dependence, cigarettes, uncomplicated: Secondary | ICD-10-CM

## 2023-08-19 DIAGNOSIS — Z87891 Personal history of nicotine dependence: Secondary | ICD-10-CM

## 2023-08-19 DIAGNOSIS — Z122 Encounter for screening for malignant neoplasm of respiratory organs: Secondary | ICD-10-CM

## 2023-08-20 ENCOUNTER — Encounter: Payer: Self-pay | Admitting: Hematology

## 2023-08-20 ENCOUNTER — Inpatient Hospital Stay

## 2023-08-20 ENCOUNTER — Inpatient Hospital Stay: Attending: Hematology | Admitting: Hematology

## 2023-08-20 VITALS — BP 144/83 | HR 61 | Temp 97.6°F | Resp 18 | Ht 72.0 in | Wt 195.7 lb

## 2023-08-20 DIAGNOSIS — Z8042 Family history of malignant neoplasm of prostate: Secondary | ICD-10-CM | POA: Diagnosis not present

## 2023-08-20 DIAGNOSIS — I10 Essential (primary) hypertension: Secondary | ICD-10-CM | POA: Insufficient documentation

## 2023-08-20 DIAGNOSIS — Z79899 Other long term (current) drug therapy: Secondary | ICD-10-CM | POA: Insufficient documentation

## 2023-08-20 DIAGNOSIS — C160 Malignant neoplasm of cardia: Secondary | ICD-10-CM

## 2023-08-20 DIAGNOSIS — K5900 Constipation, unspecified: Secondary | ICD-10-CM | POA: Diagnosis not present

## 2023-08-20 DIAGNOSIS — C169 Malignant neoplasm of stomach, unspecified: Secondary | ICD-10-CM | POA: Insufficient documentation

## 2023-08-20 DIAGNOSIS — M549 Dorsalgia, unspecified: Secondary | ICD-10-CM | POA: Diagnosis not present

## 2023-08-20 DIAGNOSIS — F17210 Nicotine dependence, cigarettes, uncomplicated: Secondary | ICD-10-CM | POA: Insufficient documentation

## 2023-08-20 DIAGNOSIS — K921 Melena: Secondary | ICD-10-CM | POA: Diagnosis not present

## 2023-08-20 DIAGNOSIS — G8929 Other chronic pain: Secondary | ICD-10-CM | POA: Diagnosis not present

## 2023-08-20 DIAGNOSIS — Z809 Family history of malignant neoplasm, unspecified: Secondary | ICD-10-CM | POA: Diagnosis not present

## 2023-08-20 HISTORY — DX: Malignant neoplasm of stomach, unspecified: C16.9

## 2023-08-20 LAB — CBC WITH DIFFERENTIAL/PLATELET
Abs Immature Granulocytes: 0.02 10*3/uL (ref 0.00–0.07)
Basophils Absolute: 0.1 10*3/uL (ref 0.0–0.1)
Basophils Relative: 1 %
Eosinophils Absolute: 0.6 10*3/uL — ABNORMAL HIGH (ref 0.0–0.5)
Eosinophils Relative: 6 %
HCT: 42.1 % (ref 39.0–52.0)
Hemoglobin: 14.7 g/dL (ref 13.0–17.0)
Immature Granulocytes: 0 %
Lymphocytes Relative: 18 %
Lymphs Abs: 1.8 10*3/uL (ref 0.7–4.0)
MCH: 32.7 pg (ref 26.0–34.0)
MCHC: 34.9 g/dL (ref 30.0–36.0)
MCV: 93.6 fL (ref 80.0–100.0)
Monocytes Absolute: 1 10*3/uL (ref 0.1–1.0)
Monocytes Relative: 10 %
Neutro Abs: 6.5 10*3/uL (ref 1.7–7.7)
Neutrophils Relative %: 65 %
Platelets: 273 10*3/uL (ref 150–400)
RBC: 4.5 MIL/uL (ref 4.22–5.81)
RDW: 12.5 % (ref 11.5–15.5)
WBC: 10 10*3/uL (ref 4.0–10.5)
nRBC: 0 % (ref 0.0–0.2)

## 2023-08-20 LAB — COMPREHENSIVE METABOLIC PANEL WITH GFR
ALT: 10 U/L (ref 0–44)
AST: 15 U/L (ref 15–41)
Albumin: 4.3 g/dL (ref 3.5–5.0)
Alkaline Phosphatase: 29 U/L — ABNORMAL LOW (ref 38–126)
Anion gap: 8 (ref 5–15)
BUN: 16 mg/dL (ref 8–23)
CO2: 31 mmol/L (ref 22–32)
Calcium: 10 mg/dL (ref 8.9–10.3)
Chloride: 99 mmol/L (ref 98–111)
Creatinine, Ser: 1.27 mg/dL — ABNORMAL HIGH (ref 0.61–1.24)
GFR, Estimated: >60 mL/min (ref >60)
Glucose, Bld: 119 mg/dL — ABNORMAL HIGH (ref 70–99)
Potassium: 4.2 mmol/L (ref 3.5–5.1)
Sodium: 138 mmol/L (ref 135–145)
Total Bilirubin: 0.6 mg/dL (ref 0.0–1.2)
Total Protein: 7.4 g/dL (ref 6.5–8.1)

## 2023-08-20 LAB — FERRITIN: Ferritin: 33 ng/mL (ref 24–336)

## 2023-08-20 LAB — CEA (ACCESS): CEA (CHCC): 4.8 ng/mL (ref 0.00–5.00)
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
## 2023-08-20 NOTE — Progress Notes (Signed)
 PATIENT NAVIGATOR PROGRESS NOTE  Name: [name] Date: 08/20/2023 MRN: [id number]  DOB: 1955/10/10   Reason for visit:  New Patient Visit--MedOnc  Comments:   Patient in office to see Dr. Mosetta Putt for his new patient visit.  Patient was given my contact information and also the [facility] with information about his diagnosis.   Referrals entered for social work and for nutrition.  Surgical appointment was made with Dr. Freida Busman at [facility] on 4/9.   Patient was added to next mutli-disciplinary conference. Request for pathology sides sent to [facility].    Time spent counseling/coordinating care: > 60 minutes

## 2023-08-23 ENCOUNTER — Telehealth: Payer: Self-pay | Admitting: Hematology

## 2023-08-23 ENCOUNTER — Telehealth: Payer: Self-pay

## 2023-08-23 ENCOUNTER — Other Ambulatory Visit: Payer: Self-pay | Admitting: Gastroenterology

## 2023-08-23 NOTE — Telephone Encounter (Signed)
 ----- Message from Fritzi Mandes sent at 08/23/2023  5:36 AM EDT ----- Regarding: RE: Please let me know if you schedule the EUS for 4/17. I was planning to do his port and diagnostic lap that morning but I can move it if needed. Thanks, Mitzi Davenport ----- Message ----- From: Lemar Lofty., MD Sent: 08/23/2023   5:23 AM EDT To: Jeani Hawking, MD; Kerin Salen, MD; # Subject: RE:                                            YF, I may have an appointment on 4/17, pending the cancellation of the patient. My team is working on that potential cancellation. If that date will work, we can offer it to this patient.  Elius Etheredge, Wait to hear back from Dr. Mosetta Putt.  If she agrees that 4/17 will work, and we have a cancellation that we discussed, you can offer to this patient for gastric cancer staging. Thanks. GM ----- Message ----- From: Malachy Mood, MD Sent: 08/22/2023   1:26 PM EDT To: Jeani Hawking, MD; Kerin Salen, MD; # Subject: RE:                                            Rema Fendt and Liz Beach,  Do you have any opening for EUS gastric cancer staging before 4/21? Dr. Dulce Sellar is out of country now. This pt also waiting for diagnostic lap and port placement based on EUS result. I am really hoping to get his EUS done asap, thanks much  Terrace Arabia ----- Message ----- From: Kerin Salen, MD Sent: 08/22/2023   1:00 PM EDT To: Malachy Mood, MD Subject: RE:                                            Rip Harbour, if you feel that after 09/02/2023 is late for eus and if Dr. Elnoria Howard or Dr. Meridee Score can do it earlier. AK ----- Message ----- From: Malachy Mood, MD Sent: 08/22/2023  12:59 PM EDT To: Willis Modena, MD; Kerin Salen, MD; # Subject: RE:                                            That's a long wait. Do you want me to reach out to Dr. Elnoria Howard and Dr. Meridee Score to see if one of them can do it sooner? ----- Message ----- From: Kerin Salen, MD Sent: 08/22/2023   9:35 AM EDT To: Willis Modena, MD; Malachy Mood, MD; # Subject:  RE:                                            Dr. Dulce Sellar is out of country till 4/21, we are trying to figure out a day for eus asap when he returns. Thanks! AK ----- Message ----- From: Nonah Mattes, RN Sent: 08/22/2023   9:13 AM EDT To: Willis Modena, MD; Kerin Salen, MD; Terrace Arabia  Mosetta Putt, MD Subject: RE:                                            Good morning Dr. Marca Ancona and Dr. Dulce Sellar,    Just wanted to follow-up with you to see if a date has been set for this patient's EUS.    Thank you,  Wilkie Aye, RN GI Navigator ----- Message ----- From: Kerin Salen, MD Sent: 08/20/2023  11:23 AM EDT To: Willis Modena, MD; Malachy Mood, MD; # Subject: RE:                                            Yes, I have sent a message to Dr. Dulce Sellar. Will get back to you with a date for his EUS. AK ----- Message ----- From: Malachy Mood, MD Sent: 08/20/2023  11:14 AM EDT To: Willis Modena, MD; Kerin Salen, MD; #  Dr. Marca Ancona,  His CT was negative for node or distant met. Will need EUS, please set up with Dr. Dulce Sellar, and let us know the date, thanks   Terrace Arabia

## 2023-08-23 NOTE — Telephone Encounter (Signed)
 EUS has been set up for 4/17 at 1045 am with GM at [facility]   Left message on machine to call back

## 2023-08-23 NOTE — Telephone Encounter (Signed)
 Thanks for update PH. GM

## 2023-08-23 NOTE — Telephone Encounter (Signed)
 Thank you I will cancel our appt

## 2023-08-27 ENCOUNTER — Encounter (HOSPITAL_COMMUNITY): Payer: Self-pay | Admitting: Surgery

## 2023-08-27 ENCOUNTER — Other Ambulatory Visit: Payer: Self-pay

## 2023-08-27 NOTE — Progress Notes (Signed)
 SDW CALL  Patient requested that I give instructions to his sister Charleston Conrad. Ms Levora Reas was given pre-op instructions over the phone. The opportunity was given for Ms Levora Reas to ask questions. No further questions asked. Ms. Levora Reas verbalized understanding of instructions given. She did state that two other "preadmission "staff had told her patient's arrival time was 0700. I apologized for the misinformation and told her we needed two and 1/2 hours to get the patient ready. I told her to do the best she could for her brother to get there at 0630,but to be safe.    PCP - Vyvyan Sun,MD Cardiologist - denies  PPM/ICD - denies Device Orders -  Rep Notified -   Chest x-ray - CT-08/16/23 EKG - DOS- last one 02/09/21 Stress Test - denies ECHO - denies Cardiac Cath - denis  Sleep Study - denies CPAP - no  Fasting Blood Sugar - na Checks Blood Sugar _____ times a day  Blood Thinner Instructions:na Aspirin Instructions:hold Goody's headache powders starting today  ERAS Protcol -clears until 0600 PRE-SURGERY Ensure or G2- no  COVID TEST- na   Anesthesia review: no

## 2023-08-28 ENCOUNTER — Ambulatory Visit (HOSPITAL_COMMUNITY)
Admission: RE | Admit: 2023-08-28 | Discharge: 2023-08-28 | Disposition: A | Discharge status: Home / Self Care | Source: Ambulatory Visit | Attending: Gastroenterology | Admitting: Gastroenterology

## 2023-08-28 ENCOUNTER — Ambulatory Visit (HOSPITAL_BASED_OUTPATIENT_CLINIC_OR_DEPARTMENT_OTHER): Admitting: Anesthesiology

## 2023-08-28 ENCOUNTER — Ambulatory Visit: Payer: Self-pay | Admitting: Surgery

## 2023-08-28 ENCOUNTER — Other Ambulatory Visit: Payer: Self-pay

## 2023-08-28 ENCOUNTER — Encounter (HOSPITAL_COMMUNITY)
Admission: RE | Disposition: A | Discharge status: Home / Self Care | Payer: Self-pay | Source: Ambulatory Visit | Attending: Gastroenterology

## 2023-08-28 ENCOUNTER — Ambulatory Visit (HOSPITAL_COMMUNITY): Admitting: Anesthesiology

## 2023-08-28 DIAGNOSIS — J45909 Unspecified asthma, uncomplicated: Secondary | ICD-10-CM | POA: Diagnosis not present

## 2023-08-28 DIAGNOSIS — K219 Gastro-esophageal reflux disease without esophagitis: Secondary | ICD-10-CM | POA: Insufficient documentation

## 2023-08-28 DIAGNOSIS — M1990 Unspecified osteoarthritis, unspecified site: Secondary | ICD-10-CM | POA: Insufficient documentation

## 2023-08-28 DIAGNOSIS — I10 Essential (primary) hypertension: Secondary | ICD-10-CM | POA: Diagnosis not present

## 2023-08-28 DIAGNOSIS — C169 Malignant neoplasm of stomach, unspecified: Secondary | ICD-10-CM | POA: Diagnosis present

## 2023-08-28 DIAGNOSIS — K8689 Other specified diseases of pancreas: Secondary | ICD-10-CM | POA: Insufficient documentation

## 2023-08-28 DIAGNOSIS — K3189 Other diseases of stomach and duodenum: Secondary | ICD-10-CM

## 2023-08-28 DIAGNOSIS — F17210 Nicotine dependence, cigarettes, uncomplicated: Secondary | ICD-10-CM | POA: Insufficient documentation

## 2023-08-28 DIAGNOSIS — C160 Malignant neoplasm of cardia: Secondary | ICD-10-CM | POA: Diagnosis not present

## 2023-08-28 HISTORY — PX: ESOPHAGOGASTRODUODENOSCOPY: SHX5428

## 2023-08-28 HISTORY — PX: EUS: SHX5427

## 2023-08-28 LAB — GLUCOSE, CAPILLARY: Glucose-Capillary: 133 mg/dL — ABNORMAL HIGH (ref 70–99)

## 2023-08-28 SURGERY — ULTRASOUND, UPPER GI TRACT, ENDOSCOPIC
Anesthesia: Monitor Anesthesia Care

## 2023-08-28 MED ORDER — LIDOCAINE 2% (20 MG/ML) 5 ML SYRINGE
INTRAMUSCULAR | Status: DC | PRN
  Administered 2023-08-28: 60 mg via INTRAVENOUS
  Administered 2023-08-28: 20 mg via INTRAVENOUS

## 2023-08-28 MED ORDER — PROPOFOL 10 MG/ML IV BOLUS
INTRAVENOUS | Status: DC | PRN
  Administered 2023-08-28 (×3): 50 mg via INTRAVENOUS
  Administered 2023-08-28: 70 mg via INTRAVENOUS
  Administered 2023-08-28: 80 mg via INTRAVENOUS
  Administered 2023-08-28: 60 mg via INTRAVENOUS
  Administered 2023-08-28: 20 mg via INTRAVENOUS
  Administered 2023-08-28: 70 mg via INTRAVENOUS
  Administered 2023-08-28: 110 mg via INTRAVENOUS

## 2023-08-28 MED ORDER — ONDANSETRON HCL 4 MG/2ML IJ SOLN
INTRAMUSCULAR | Status: DC | PRN
  Administered 2023-08-28: 4 mg via INTRAVENOUS

## 2023-08-28 MED ORDER — PROPOFOL 500 MG/50ML IV EMUL
INTRAVENOUS | Status: AC
  Filled 2023-08-28: qty 50

## 2023-08-28 MED ORDER — SODIUM CHLORIDE 0.9 % IV SOLN: INTRAVENOUS | Status: DC | PRN

## 2023-08-28 NOTE — Discharge Instructions (Signed)

## 2023-08-28 NOTE — Anesthesia Procedure Notes (Signed)
 Procedure Name: MAC Date/Time: 08/28/2023 12:12 PM  Performed by: Mervyn Ace, CRNAPre-anesthesia Checklist: Patient identified, Emergency Drugs available, Suction available, Patient being monitored and Timeout performed Patient Re-evaluated:Patient Re-evaluated prior to induction Oxygen Delivery Method: Simple face mask

## 2023-08-28 NOTE — Transfer of Care (Signed)
 Immediate Anesthesia Transfer of Care Note  Patient: [name]  Procedure(s) Performed: ULTRASOUND, UPPER GI TRACT, ENDOSCOPIC EGD (ESOPHAGOGASTRODUODENOSCOPY)  Patient Location: Endoscopy Unit  Anesthesia Type:MAC  Level of Consciousness: awake, alert , oriented, and patient cooperative  Airway & Oxygen Therapy: Patient Spontanous Breathing and Patient connected to face mask oxygen  Post-op Assessment: Report given to RN and Post -op Vital signs reviewed and stable  Post vital signs: Reviewed and stable  Last Vitals:  Vitals Value Taken Time  BP    Temp    Pulse 65 08/28/23 1304  Resp 17 08/28/23 1304  SpO2 96 % 08/28/23 1304  Vitals shown include unfiled device data.  Last Pain:  Vitals:   08/28/23 1101  TempSrc: Temporal  PainSc: 8          Complications: No notable events documented.
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
## 2023-08-28 NOTE — Op Note (Signed)
 [facility] Patient Name: [name] Procedure Date: 08/28/2023 MRN: [id number] Attending MD: Jeani Hawking , MD, [id number] Date of Birth: Aug 25, 1955 CSN: [id number] Age: 68 Admit Type: Inpatient Procedure:                Upper EUS Indications:              Gastric mucosal mass/polyp found on endoscopy Providers:                Jeani Hawking, MD, Rogue Jury, RN, Rozetta Nunnery, Technician Referring MD:              Medicines:                Propofol per Anesthesia Complications:            No immediate complications. Estimated Blood Loss:     Estimated blood loss: none. Procedure:                Pre-Anesthesia Assessment:                           - Prior to the procedure, a History and Physical                            was performed, and patient medications and                            allergies were reviewed. The patient's tolerance of                            previous anesthesia was also reviewed. The risks                            and benefits of the procedure and the sedation                            options and risks were discussed with the patient.                            All questions were answered, and informed consent                            was obtained. Prior Anticoagulants: The patient has                            taken no anticoagulant or antiplatelet agents. ASA                            Grade Assessment: II - A patient with mild systemic                            disease. After reviewing the risks and benefits,  the patient was deemed in satisfactory condition to                            undergo the procedure.                           - Sedation was administered by an anesthesia                            professional. Deep sedation was attained.                           After obtaining informed consent, the endoscope was                            passed under direct  vision. Throughout the                            procedure, the patient's blood pressure, pulse, and                            oxygen saturations were monitored continuously. The                            [id number]-UCT180 ([id number] Olympus linear ultrasound scope                            was introduced through the mouth, and advanced to                            the second part of duodenum. The upper EUS was                            accomplished without difficulty. The patient                            tolerated the procedure well. Scope In: Scope Out: Findings:      ENDOSCOPIC FINDING: :      The examined esophagus was endoscopically normal.      A large, infiltrative, non-circumferential mass with no bleeding and no       stigmata of recent bleeding was found in the cardia.      ENDOSONOGRAPHIC FINDING: :      A hypoechoic irregular mass was identified endosonographically in the       cardia of the stomach. The mass begins at 0 cm from the gastroesophageal       junction and measured 27 mm by 35 mm in maximal cross-sectional       diameter. The endosonographic borders were well-defined. There was       sonographic evidence suggesting invasion into the muscularis propria       (Layer 4).      The mass was identified in the cardia of the stomach. It measured 27 mm       x 35 mm and there was involvement of the muscularis propria (uT2N0Mx).       The GE junction was grossly  intact, however, there was a fullness       underneath the GE junction/distal esophagus suggesting an invasion into       the esophagus. No lymph nodes were identified in the subcarina or Celiac       axis. The left lobe of the liver was normal and the visualized portion       of the right lobe of the liver was normal. No lymphadenopathy was       identified. The PD was noted to be dilated in the head (5 mm) and neck       (4 mm) of the pancreas. No pancreatic head masses were noted. The CBD       was normal in  caliber. The left adrenal was normal. Impression:               - Normal esophagus.                           - Malignant gastric tumor in the cardia.                           - A mass was found in the cardia of the stomach.                            The diagnosis is adenocarcinoma. This was staged                            uT2 N0 Mx by endosonographic criteria.                           - No specimens collected. Moderate Sedation:      Not Applicable - Patient had care per Anesthesia. Recommendation:           - Patient has a contact number available for                            emergencies. The signs and symptoms of potential                            delayed complications were discussed with the                            patient. Return to normal activities tomorrow.                            Written discharge instructions were provided to the                            patient.                           - Resume regular diet.                           - Further management per Medical and Surgical  Oncology. Procedure Code(s):        --- Professional ---                           [phone], Esophagogastroduodenoscopy, flexible,                            transoral; with endoscopic ultrasound examination                            limited to the esophagus, stomach or duodenum, and                            adjacent structures Diagnosis Code(s):        --- Professional ---                           C16.0, Malignant neoplasm of cardia                           K31.89, Other diseases of stomach and duodenum CPT copyright 2022 American Medical Association. All rights reserved. The codes documented in this report are preliminary and upon coder review may  be revised to meet current compliance requirements. Alvis Jourdain, MD Alvis Jourdain, MD 08/28/2023 1:08:34 PM This report has been signed electronically. Number of Addenda: 0

## 2023-08-28 NOTE — Anesthesia Postprocedure Evaluation (Signed)
 Anesthesia Post Note  Patient: Martin Belling  Procedure(s) Performed: ULTRASOUND, UPPER GI TRACT, ENDOSCOPIC EGD (ESOPHAGOGASTRODUODENOSCOPY)     Patient location during evaluation: PACU Anesthesia Type: MAC Level of consciousness: awake and alert Pain management: pain level controlled Vital Signs Assessment: post-procedure vital signs reviewed and stable Respiratory status: spontaneous breathing, nonlabored ventilation and respiratory function stable Cardiovascular status: blood pressure returned to baseline and stable Postop Assessment: no apparent nausea or vomiting Anesthetic complications: no   No notable events documented.  Last Vitals:  Vitals:   08/28/23 1325 08/28/23 1340  BP:  (!) 165/98  Pulse: 62 (!) 59  Resp: 16 17  Temp:    SpO2: 98% 95%    Last Pain:  Vitals:   08/28/23 1325  TempSrc:   PainSc: 6                  Earvin Goldberg

## 2023-08-28 NOTE — Anesthesia Preprocedure Evaluation (Signed)
 Anesthesia Evaluation  Patient identified by MRN, date of birth, ID band Patient awake    Reviewed: Allergy & Precautions, NPO status , Patient's Chart, lab work & pertinent test results  History of Anesthesia Complications Negative for: history of anesthetic complications  Airway Mallampati: II  TM Distance: >3 FB Neck ROM: Full    Dental no notable dental hx. (+) Dental Advisory Given   Pulmonary asthma , Current Smoker and Patient abstained from smoking.   Pulmonary exam normal breath sounds clear to auscultation       Cardiovascular hypertension, Pt. on medications Normal cardiovascular exam Rhythm:Regular Rate:Normal     Neuro/Psych negative neurological ROS  negative psych ROS   GI/Hepatic Neg liver ROS,GERD  Medicated and Controlled,,  Endo/Other  negative endocrine ROS    Renal/GU negative Renal ROS  negative genitourinary   Musculoskeletal  (+) Arthritis , Osteoarthritis,    Abdominal   Peds negative pediatric ROS (+)  Hematology negative hematology ROS (+)   Anesthesia Other Findings Gastric Cancer  Reproductive/Obstetrics negative OB ROS                             Anesthesia Physical Anesthesia Plan  ASA: 3  Anesthesia Plan: MAC   Post-op Pain Management: Minimal or no pain anticipated   Induction: Intravenous  PONV Risk Score and Plan: 0 and Treatment may vary due to age or medical condition  Airway Management Planned: Nasal Cannula  Additional Equipment:   Intra-op Plan:   Post-operative Plan:   Informed Consent: I have reviewed the patients History and Physical, chart, labs and discussed the procedure including the risks, benefits and alternatives for the proposed anesthesia with the patient or authorized representative who has indicated his/her understanding and acceptance.     Dental advisory given  Plan Discussed with: CRNA  Anesthesia Plan  Comments:         Anesthesia Quick Evaluation

## 2023-08-29 ENCOUNTER — Encounter (HOSPITAL_COMMUNITY)
Admission: RE | Disposition: A | Discharge status: Home / Self Care | Payer: Self-pay | Source: Ambulatory Visit | Attending: Surgery

## 2023-08-29 ENCOUNTER — Ambulatory Visit (HOSPITAL_COMMUNITY): Admitting: Anesthesiology

## 2023-08-29 ENCOUNTER — Ambulatory Visit (HOSPITAL_COMMUNITY)

## 2023-08-29 ENCOUNTER — Encounter (HOSPITAL_COMMUNITY): Payer: Self-pay | Admitting: Surgery

## 2023-08-29 ENCOUNTER — Ambulatory Visit (HOSPITAL_BASED_OUTPATIENT_CLINIC_OR_DEPARTMENT_OTHER): Admitting: Anesthesiology

## 2023-08-29 ENCOUNTER — Ambulatory Visit (HOSPITAL_COMMUNITY): Admit: 2023-08-29 | Admitting: Gastroenterology

## 2023-08-29 ENCOUNTER — Other Ambulatory Visit: Payer: Self-pay

## 2023-08-29 ENCOUNTER — Inpatient Hospital Stay: Admitting: Hematology

## 2023-08-29 ENCOUNTER — Encounter (HOSPITAL_COMMUNITY): Payer: Self-pay

## 2023-08-29 ENCOUNTER — Ambulatory Visit (HOSPITAL_COMMUNITY)
Admission: RE | Admit: 2023-08-29 | Discharge: 2023-08-29 | Disposition: A | Discharge status: Home / Self Care | Source: Ambulatory Visit | Attending: Surgery | Admitting: Surgery

## 2023-08-29 DIAGNOSIS — C160 Malignant neoplasm of cardia: Secondary | ICD-10-CM | POA: Diagnosis present

## 2023-08-29 DIAGNOSIS — J45909 Unspecified asthma, uncomplicated: Secondary | ICD-10-CM

## 2023-08-29 DIAGNOSIS — I10 Essential (primary) hypertension: Secondary | ICD-10-CM

## 2023-08-29 DIAGNOSIS — K219 Gastro-esophageal reflux disease without esophagitis: Secondary | ICD-10-CM | POA: Diagnosis not present

## 2023-08-29 DIAGNOSIS — F17210 Nicotine dependence, cigarettes, uncomplicated: Secondary | ICD-10-CM | POA: Diagnosis not present

## 2023-08-29 DIAGNOSIS — Z8042 Family history of malignant neoplasm of prostate: Secondary | ICD-10-CM | POA: Diagnosis not present

## 2023-08-29 DIAGNOSIS — C169 Malignant neoplasm of stomach, unspecified: Secondary | ICD-10-CM | POA: Diagnosis not present

## 2023-08-29 DIAGNOSIS — Z809 Family history of malignant neoplasm, unspecified: Secondary | ICD-10-CM | POA: Diagnosis not present

## 2023-08-29 HISTORY — PX: LAPAROSCOPY: SHX197

## 2023-08-29 HISTORY — PX: PORTACATH PLACEMENT: SHX2246

## 2023-08-29 SURGERY — LAPAROSCOPY, DIAGNOSTIC
Anesthesia: General

## 2023-08-29 SURGERY — ULTRASOUND, UPPER GI TRACT, ENDOSCOPIC
Anesthesia: Monitor Anesthesia Care

## 2023-08-29 MED ORDER — BUPIVACAINE-EPINEPHRINE 0.25% -1:200000 IJ SOLN
INTRAMUSCULAR | Status: DC | PRN
  Administered 2023-08-29: 13 mL

## 2023-08-29 MED ORDER — FENTANYL CITRATE (PF) 250 MCG/5ML IJ SOLN
INTRAMUSCULAR | Status: DC | PRN
Start: 2023-08-29 — End: 2023-08-29
  Administered 2023-08-29 (×2): 50 ug via INTRAVENOUS
  Administered 2023-08-29: 100 ug via INTRAVENOUS
  Administered 2023-08-29: 50 ug via INTRAVENOUS

## 2023-08-29 MED ORDER — VASOPRESSIN 20 UNIT/ML IV SOLN
INTRAVENOUS | Status: AC
  Filled 2023-08-29: qty 1

## 2023-08-29 MED ORDER — CELECOXIB 200 MG PO CAPS
200.0000 mg | ORAL_CAPSULE | Freq: Once | ORAL | Status: AC
  Administered 2023-08-29: 200 mg via ORAL
  Filled 2023-08-29: qty 1

## 2023-08-29 MED ORDER — OXYCODONE HCL 5 MG PO TABS: 5.0000 mg | ORAL_TABLET | Freq: Once | ORAL | Status: DC | PRN

## 2023-08-29 MED ORDER — LIDOCAINE 2% (20 MG/ML) 5 ML SYRINGE
INTRAMUSCULAR | Status: DC | PRN
  Administered 2023-08-29: 90 mg via INTRAVENOUS

## 2023-08-29 MED ORDER — ONDANSETRON HCL 4 MG/2ML IJ SOLN
INTRAMUSCULAR | Status: DC | PRN
  Administered 2023-08-29: 4 mg via INTRAVENOUS

## 2023-08-29 MED ORDER — EPHEDRINE 5 MG/ML INJ
INTRAVENOUS | Status: AC
  Filled 2023-08-29: qty 5

## 2023-08-29 MED ORDER — PROPOFOL 10 MG/ML IV BOLUS
INTRAVENOUS | Status: AC
  Filled 2023-08-29: qty 20

## 2023-08-29 MED ORDER — ACETAMINOPHEN 500 MG PO TABS
1000.0000 mg | ORAL_TABLET | Freq: Once | ORAL | Status: AC
  Administered 2023-08-29: 1000 mg via ORAL
  Filled 2023-08-29: qty 2

## 2023-08-29 MED ORDER — HEPARIN 6000 UNIT IRRIGATION SOLUTION
Status: AC
  Filled 2023-08-29: qty 500

## 2023-08-29 MED ORDER — SODIUM CHLORIDE 0.9 % IV SOLN: 12.5000 mg | INTRAVENOUS | Status: DC | PRN

## 2023-08-29 MED ORDER — FENTANYL CITRATE (PF) 250 MCG/5ML IJ SOLN
INTRAMUSCULAR | Status: AC
Start: 2023-08-29 — End: ?
  Filled 2023-08-29: qty 5

## 2023-08-29 MED ORDER — DEXAMETHASONE SODIUM PHOSPHATE 10 MG/ML IJ SOLN
INTRAMUSCULAR | Status: DC | PRN
Start: 2023-08-29 — End: 2023-08-29
  Administered 2023-08-29: 10 mg via INTRAVENOUS

## 2023-08-29 MED ORDER — EPHEDRINE SULFATE-NACL 50-0.9 MG/10ML-% IV SOSY
PREFILLED_SYRINGE | INTRAVENOUS | Status: DC | PRN
  Administered 2023-08-29 (×2): 10 mg via INTRAVENOUS
  Administered 2023-08-29: 5 mg via INTRAVENOUS

## 2023-08-29 MED ORDER — OXYCODONE HCL 5 MG/5ML PO SOLN: 5.0000 mg | Freq: Once | ORAL | Status: DC | PRN

## 2023-08-29 MED ORDER — INSULIN ASPART 100 UNIT/ML IJ SOLN: 0.0000 [IU] | INTRAMUSCULAR | Status: DC | PRN

## 2023-08-29 MED ORDER — PHENYLEPHRINE 80 MCG/ML (10ML) SYRINGE FOR IV PUSH (FOR BLOOD PRESSURE SUPPORT)
PREFILLED_SYRINGE | INTRAVENOUS | Status: AC
  Filled 2023-08-29: qty 10

## 2023-08-29 MED ORDER — FENTANYL CITRATE (PF) 100 MCG/2ML IJ SOLN: 25.0000 ug | INTRAMUSCULAR | Status: DC | PRN

## 2023-08-29 MED ORDER — PROPOFOL 10 MG/ML IV BOLUS
INTRAVENOUS | Status: DC | PRN
  Administered 2023-08-29: 150 mg via INTRAVENOUS
  Administered 2023-08-29: 30 mg via INTRAVENOUS

## 2023-08-29 MED ORDER — SUGAMMADEX SODIUM 200 MG/2ML IV SOLN
INTRAVENOUS | Status: DC | PRN
  Administered 2023-08-29: 200 mg via INTRAVENOUS

## 2023-08-29 MED ORDER — HEPARIN SOD (PORK) LOCK FLUSH 100 UNIT/ML IV SOLN
INTRAVENOUS | Status: AC
  Filled 2023-08-29: qty 5

## 2023-08-29 MED ORDER — ACETAMINOPHEN 10 MG/ML IV SOLN: 1000.0000 mg | Freq: Once | INTRAVENOUS | Status: DC | PRN

## 2023-08-29 MED ORDER — DEXAMETHASONE SODIUM PHOSPHATE 10 MG/ML IJ SOLN
INTRAMUSCULAR | Status: AC
  Filled 2023-08-29: qty 1

## 2023-08-29 MED ORDER — LACTATED RINGERS IV SOLN: INTRAVENOUS | Status: DC | PRN

## 2023-08-29 MED ORDER — DEXMEDETOMIDINE HCL IN NACL 200 MCG/50ML IV SOLN
INTRAVENOUS | Status: DC | PRN
  Administered 2023-08-29: 12 ug via INTRAVENOUS

## 2023-08-29 MED ORDER — ROCURONIUM BROMIDE 10 MG/ML (PF) SYRINGE
PREFILLED_SYRINGE | INTRAVENOUS | Status: DC | PRN
  Administered 2023-08-29: 10 mg via INTRAVENOUS
  Administered 2023-08-29: 50 mg via INTRAVENOUS
  Administered 2023-08-29: 20 mg via INTRAVENOUS

## 2023-08-29 MED ORDER — LIDOCAINE 2% (20 MG/ML) 5 ML SYRINGE
INTRAMUSCULAR | Status: AC
  Filled 2023-08-29: qty 5

## 2023-08-29 MED ORDER — CEFAZOLIN SODIUM-DEXTROSE 2-4 GM/100ML-% IV SOLN
2.0000 g | INTRAVENOUS | Status: AC
  Administered 2023-08-29: 2 g via INTRAVENOUS
  Filled 2023-08-29: qty 100

## 2023-08-29 MED ORDER — ONDANSETRON HCL 4 MG/2ML IJ SOLN
INTRAMUSCULAR | Status: AC
  Filled 2023-08-29: qty 2

## 2023-08-29 MED ORDER — PHENYLEPHRINE 80 MCG/ML (10ML) SYRINGE FOR IV PUSH (FOR BLOOD PRESSURE SUPPORT)
PREFILLED_SYRINGE | INTRAVENOUS | Status: DC | PRN
  Administered 2023-08-29: 80 ug via INTRAVENOUS
  Administered 2023-08-29: 160 ug via INTRAVENOUS
  Administered 2023-08-29: 240 ug via INTRAVENOUS

## 2023-08-29 MED ORDER — ROCURONIUM BROMIDE 10 MG/ML (PF) SYRINGE
PREFILLED_SYRINGE | INTRAVENOUS | Status: AC
  Filled 2023-08-29: qty 10

## 2023-08-29 MED ORDER — CHLORHEXIDINE GLUCONATE 0.12 % MT SOLN
OROMUCOSAL | Status: AC
  Filled 2023-08-29: qty 15

## 2023-08-29 MED ORDER — DEXMEDETOMIDINE HCL IN NACL 80 MCG/20ML IV SOLN
INTRAVENOUS | Status: AC
  Filled 2023-08-29: qty 20

## 2023-08-29 SURGICAL SUPPLY — 48 items
BAG COUNTER SPONGE SURGICOUNT (BAG) ×1 IMPLANT
BAG DECANTER FOR FLEXI CONT (MISCELLANEOUS) ×1 IMPLANT
BLADE CLIPPER SURG (BLADE) IMPLANT
CANISTER SUCT 3000ML PPV (MISCELLANEOUS) IMPLANT
CHLORAPREP W/TINT 26 (MISCELLANEOUS) ×1 IMPLANT
COVER MAYO STAND STRL (DRAPES) IMPLANT
COVER SURGICAL LIGHT HANDLE (MISCELLANEOUS) ×1 IMPLANT
COVER TRANSDUCER ULTRASND GEL (DISPOSABLE) IMPLANT
DERMABOND ADVANCED .7 DNX12 (GAUZE/BANDAGES/DRESSINGS) ×1 IMPLANT
DRAPE C-ARM 42X120 X-RAY (DRAPES) ×1 IMPLANT
DRAPE LAPAROSCOPIC ABDOMINAL (DRAPES) ×1 IMPLANT
ELECT CAUTERY BLADE 6.4 (BLADE) ×1 IMPLANT
ELECT REM PT RETURN 9FT ADLT (ELECTROSURGICAL) ×1 IMPLANT
ELECTRODE REM PT RTRN 9FT ADLT (ELECTROSURGICAL) ×1 IMPLANT
GEL ULTRASOUND 20GR AQUASONIC (MISCELLANEOUS) IMPLANT
GLOVE BIOGEL PI IND STRL 6 (GLOVE) ×1 IMPLANT
GLOVE BIOGEL PI MICRO STRL 5.5 (GLOVE) ×1 IMPLANT
GLOVE SURG SYN 5.5 (GLOVE) ×1 IMPLANT
GLOVE SURG SYN 5.5 PF PI (GLOVE) ×1 IMPLANT
GOWN STRL REUS W/ TWL LRG LVL3 (GOWN DISPOSABLE) ×2 IMPLANT
INTRODUCER COOK 11FR (CATHETERS) IMPLANT
IRRIG SUCT STRYKERFLOW 2 WTIP (MISCELLANEOUS) IMPLANT
IRRIGATION SUCT STRKRFLW 2 WTP (MISCELLANEOUS) IMPLANT
KIT BASIN OR (CUSTOM PROCEDURE TRAY) ×1 IMPLANT
KIT PORT POWER 8FR ISP CVUE (Port) IMPLANT
KIT TURNOVER KIT B (KITS) ×1 IMPLANT
NDL INSUFFLATION 14GA 120MM (NEEDLE) ×1 IMPLANT
NEEDLE INSUFFLATION 14GA 120MM (NEEDLE) ×1 IMPLANT
NS IRRIG 1000ML POUR BTL (IV SOLUTION) ×1 IMPLANT
PAD ARMBOARD POSITIONER FOAM (MISCELLANEOUS) ×2 IMPLANT
PENCIL BUTTON HOLSTER BLD 10FT (ELECTRODE) ×1 IMPLANT
POSITIONER HEAD DONUT 9IN (MISCELLANEOUS) ×1 IMPLANT
SCISSORS LAP 5X35 DISP (ENDOMECHANICALS) IMPLANT
SET INTRODUCER 12FR PACEMAKER (INTRODUCER) IMPLANT
SET SHEATH INTRODUCER 10FR (MISCELLANEOUS) IMPLANT
SET TUBE SMOKE EVAC HIGH FLOW (TUBING) ×1 IMPLANT
SHEATH COOK PEEL AWAY SET 9F (SHEATH) IMPLANT
SLEEVE Z-THREAD 5X100MM (TROCAR) ×1 IMPLANT
SUT MNCRL AB 4-0 PS2 18 (SUTURE) ×1 IMPLANT
SUT PROLENE 2 0 SH DA (SUTURE) ×2 IMPLANT
SUT VIC AB 3-0 SH 27X BRD (SUTURE) ×1 IMPLANT
SYR 5ML LUER SLIP (SYRINGE) ×1 IMPLANT
TOWEL GREEN STERILE (TOWEL DISPOSABLE) ×1 IMPLANT
TOWEL GREEN STERILE FF (TOWEL DISPOSABLE) ×1 IMPLANT
TRAY LAPAROSCOPIC MC (CUSTOM PROCEDURE TRAY) ×1 IMPLANT
TROCAR BALLN 12MMX100 BLUNT (TROCAR) IMPLANT
TROCAR Z-THREAD OPTICAL 5X100M (TROCAR) ×1 IMPLANT
WARMER LAPAROSCOPE (MISCELLANEOUS) ×1 IMPLANT

## 2023-08-29 NOTE — Anesthesia Procedure Notes (Addendum)
 Procedure Name: Intubation Date/Time: 08/29/2023 7:55 AM  Performed by: Claybon Cuna, CRNAPre-anesthesia Checklist: Patient identified, Emergency Drugs available, Suction available and Patient being monitored Patient Re-evaluated:Patient Re-evaluated prior to induction Oxygen Delivery Method: Circle system utilized Preoxygenation: Pre-oxygenation with 100% oxygen Induction Type: IV induction Ventilation: Mask ventilation without difficulty Laryngoscope Size: Glidescope and 4 Grade View: Grade I Tube type: Oral Tube size: 7.5 mm Number of attempts: 1 Airway Equipment and Method: Stylet and Oral airway Placement Confirmation: ETT inserted through vocal cords under direct vision, positive ETCO2 and breath sounds checked- equal and bilateral Secured at: 23 cm Tube secured with: Tape Dental Injury: Teeth and Oropharynx as per pre-operative assessment

## 2023-08-29 NOTE — Transfer of Care (Signed)
 Immediate Anesthesia Transfer of Care Note  Patient: [name]  Procedure(s) Performed: LAPAROSCOPY, DIAGNOSTIC INSERTION, TUNNELED CENTRAL VENOUS DEVICE, WITH PORT  Patient Location: PACU  Anesthesia Type:General  Level of Consciousness: awake  Airway & Oxygen Therapy: Patient connected to face mask oxygen  Post-op Assessment: Post -op Vital signs reviewed and stable  Post vital signs: stable  Last Vitals:  Vitals Value Taken Time  BP 149/85 08/29/23 0906  Temp    Pulse 62 08/29/23 0909  Resp 19 08/29/23 0909  SpO2 100 % 08/29/23 0909  Vitals shown include unfiled device data.  Last Pain:  Vitals:   08/29/23 0732  TempSrc:   PainSc: 6       Patients Stated Pain Goal: 2 (08/29/23 0717)  Complications: No notable events documented.

## 2023-08-29 NOTE — Op Note (Signed)
 Date: 08/29/23  Patient: [name] MRN: [id number]  Preoperative Diagnosis: Gastric adenocarcinoma Postoperative Diagnosis: Same  Procedure:  Port-a-cath insertion Staging laparoscopy with peritoneal washings  Surgeon: Karleen Overall, MD  EBL: Minimal  Anesthesia: General endotracheal  Specimens: Peritoneal washings for cytology  Indications: Mr. Radi is a 68 yo male who was recently diagnosed with adenocarcinoma of the gastric cardia after having worsening reflux for several months. EUS staged him as a uT2N0. He is to undergo neoadjuvant chemotherapy prior to surgery. He presents today for port placement and staging laparoscopy.  Findings: 8-Fr single-lumen power port placed via the right subclavian vein under fluoroscopic guidance. Total catheter length of 16cm. No gross evidence of metastatic disease within the abdomen. Peritoneal washings were collected and sent for cytology.  Procedure details: Informed consent was obtained in the preoperative area prior to the procedure. The patient was brought to the operating room and placed on the table in the supine position. General anesthesia was induced and appropriate lines and drains were placed for intraoperative monitoring. Perioperative antibiotics were administered per SCIP guidelines. The neck, chest and abdomen were prepped and draped in the usual sterile fashion. A pre-procedure timeout was taken verifying patient identity, surgical site and procedure to be performed.  The patient was placed in Trendelenberg and the right subclavian vein was accessed with a large-bore needle. A guidewire was inserted and advanced, and position in the SVC was confirmed fluoroscopically. The needle was removed and the wire was clipped to the drapes to secure its position. A small skin incision was made on the right upper chest wall, incorporating the wire exit site, and a subcutaneous pocket was created with cautery. The port and catheter were then  flushed and brought onto the field. Three 2-0 prolene sutures were used to secure the port in the subcutaneous pocket, but the sutures were not tied down. The port was placed in the pocket and the attached cathether was measured using fluoro - it was placed over the skin adjacent to the guidewire, and marked externally at the cavoatrial junction. The catheter was then cut at this location, which was at 16cm. The dilator and sheath were then advanced over the guidewire under fluoroscopic guidance, and the wire and dilator were removed. The end of the catheter was inserted through the sheath and advanced, and the sheath was peeled away. The port was then accessed with a Huber needle, and blood was aspirated and the port was flushed with heparinized saline. A final fluoroscopic image confirmed appropriate position of the catheter tip within the SVC, without kinking of the catheter. The prolene sutures were tied down. A final flush of 500 units heparin (100 units/mL) was given via the port. The skin was closed with a deep dermal layer of interrupted 3-0 Vicryl suture, followed by a running subcuticular 4-0 monocryl suture. Dermabond was applied.  Next a small skin incision was made in the left upper quadrant at [name]'s point. The fascia was grasped and elevated, a Veress needle was inserted through the fascia, and intraperitoneal placement was confirmed with the saline drop test. A 5mm Visiport was placed and the peritoneal cavity was inspected with no evidence of visceral or vascular injury. A circular sheet of mesh was present at the umbilicus. An additional 5mm port was placed in the epigastric area above the mesh. Both surfaces of the diaphragm, the peritoneal surface of the abdomen and pelvis, and both lobes of the liver were closely examined and there was no evidence of carcinomatosis or  metastatic disease. The stomach, omentum and small bowel were grossly normal in appearance with no nodules on the surfaces.  The peritoneal cavity was irrigated with saline, which was then evacuated and collected, and sent for cytology. The ports were removed and the abdomen was desufflated. The port sites were closed with 4-0 monocryl subcuticular suture. Dermabond was applied.  The patient tolerated the procedure well with no apparent complications. All counts were correct x2 at the end of the procedure. The patient was extubated and taken to PACU in stable condition.  Karleen Overall, MD 08/29/23 9:00 AM

## 2023-08-29 NOTE — Anesthesia Preprocedure Evaluation (Addendum)
 Anesthesia Evaluation  Patient identified by MRN, date of birth, ID band Patient awake    Reviewed: Allergy & Precautions, NPO status , Patient's Chart, lab work & pertinent test results  Airway Mallampati: III  TM Distance: >3 FB Neck ROM: Limited    Dental no notable dental hx. (+) Dental Advisory Given, Poor Dentition, Missing   Pulmonary asthma , Current Smoker and Patient abstained from smoking.   Pulmonary exam normal breath sounds clear to auscultation       Cardiovascular hypertension, Pt. on medications and Pt. on home beta blockers Normal cardiovascular exam Rhythm:Regular Rate:Normal     Neuro/Psych negative neurological ROS  negative psych ROS   GI/Hepatic Neg liver ROS,GERD  Medicated and Controlled,,  Endo/Other  negative endocrine ROS    Renal/GU negative Renal ROS  negative genitourinary   Musculoskeletal  (+) Arthritis , Osteoarthritis,    Abdominal Normal abdominal exam  (+)   Peds negative pediatric ROS (+)  Hematology negative hematology ROS (+) Lab Results      Component                Value               Date                      WBC                      10.0                08/20/2023                HGB                      14.7                08/20/2023                HCT                      42.1                08/20/2023                MCV                      93.6                08/20/2023                PLT                      273                 08/20/2023             Lab Results      Component                Value               Date                      NA                       138                 08/20/2023  K                        4.2                 08/20/2023                CO2                      31                  08/20/2023                GLUCOSE                  119 (H)             08/20/2023                BUN                      16                   08/20/2023                CREATININE               1.27 (H)            08/20/2023                CALCIUM                  10.0                08/20/2023                GFRNONAA                 >60                 08/20/2023              Anesthesia Other Findings Gastric Cancer  Reproductive/Obstetrics negative OB ROS                             Anesthesia Physical Anesthesia Plan  ASA: 3  Anesthesia Plan: General   Post-op Pain Management: Minimal or no pain anticipated   Induction: Intravenous  PONV Risk Score and Plan: 0 and Treatment may vary due to age or medical condition, Ondansetron and Dexamethasone  Airway Management Planned: Mask and Oral ETT  Additional Equipment: None  Intra-op Plan:   Post-operative Plan: Extubation in OR  Informed Consent: I have reviewed the patients History and Physical, chart, labs and discussed the procedure including the risks, benefits and alternatives for the proposed anesthesia with the patient or authorized representative who has indicated his/her understanding and acceptance.     Dental advisory given  Plan Discussed with: CRNA  Anesthesia Plan Comments:         Anesthesia Quick Evaluation

## 2023-08-29 NOTE — Discharge Instructions (Signed)
 SURGERY DISCHARGE INSTRUCTIONS  Activity You may resume your usual activities as tolerated Ok to shower in 24 hours, but do not bathe or submerge incisions underwater. Do not drive while taking narcotic pain medication.  Wound Care Your incisions are covered with skin glue called Dermabond. This will peel off on its own over time. You may shower and allow warm soapy water to run over your incisions. Gently pat dry. Do not submerge your incision underwater. Monitor your incision for any new redness, tenderness, or drainage. You may start using your port in 48 hours. Do not apply EMLA cream directly over the Dermabond (skin glue).  When to Call Us : Fever greater than 100.5 New redness, drainage, or swelling at incision site Severe pain, nausea, or vomiting Shortness of breath, difficulty breathing  For questions or concerns, please call the [facility] Surgery office at [phone].

## 2023-08-29 NOTE — Anesthesia Postprocedure Evaluation (Signed)
 Anesthesia Post Note  Patient: [name]  Procedure(s) Performed: LAPAROSCOPY, DIAGNOSTIC INSERTION, TUNNELED CENTRAL VENOUS DEVICE, WITH PORT     Patient location during evaluation: PACU Anesthesia Type: General Level of consciousness: awake and alert Pain management: pain level controlled Vital Signs Assessment: post-procedure vital signs reviewed and stable Respiratory status: spontaneous breathing, nonlabored ventilation, respiratory function stable and patient connected to nasal cannula oxygen Cardiovascular status: blood pressure returned to baseline and stable Postop Assessment: no apparent nausea or vomiting Anesthetic complications: no   No notable events documented.  Last Vitals:  Vitals:   08/29/23 0930 08/29/23 0945  BP: (!) 156/88 (!) 160/91  Pulse: 61 61  Resp: 13 15  Temp:  36.4 C  SpO2: 96% 96%    Last Pain:  Vitals:   08/29/23 0930  TempSrc:   PainSc: 0-No pain                 Theotis Flake P Matty Deamer
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
## 2023-08-30 ENCOUNTER — Other Ambulatory Visit: Payer: Self-pay | Admitting: Hematology

## 2023-08-30 ENCOUNTER — Encounter (HOSPITAL_COMMUNITY): Payer: Self-pay | Admitting: Gastroenterology

## 2023-08-30 ENCOUNTER — Other Ambulatory Visit: Payer: Self-pay

## 2023-08-30 DIAGNOSIS — C160 Malignant neoplasm of cardia: Secondary | ICD-10-CM

## 2023-08-30 LAB — CYTOLOGY - NON PAP

## 2023-08-30 MED ORDER — ONDANSETRON HCL 8 MG PO TABS: 8.0000 mg | ORAL_TABLET | Freq: Three times a day (TID) | ORAL | 1 refills | Status: DC | PRN

## 2023-08-30 MED ORDER — DEXAMETHASONE 4 MG PO TABS
8.0000 mg | ORAL_TABLET | Freq: Every day | ORAL | 1 refills | Status: DC
Start: 2023-08-30 — End: 2024-03-14

## 2023-08-30 MED ORDER — LIDOCAINE-PRILOCAINE 2.5-2.5 % EX CREA: TOPICAL_CREAM | CUTANEOUS | 3 refills | Status: DC

## 2023-08-30 MED ORDER — PROCHLORPERAZINE MALEATE 10 MG PO TABS: 10.0000 mg | ORAL_TABLET | Freq: Four times a day (QID) | ORAL | 1 refills | Status: DC | PRN

## 2023-08-30 NOTE — Progress Notes (Signed)
START ON PATHWAY REGIMEN - Gastroesophageal     A cycle is every 14 days:     Docetaxel      Oxaliplatin      Leucovorin      Fluorouracil   **Always confirm dose/schedule in your pharmacy ordering system**  Patient Characteristics: Gastric, Adenocarcinoma, Preoperative or Nonsurgical Candidate, M0 (Clinical Staging), cT3 or Higher or cN+, Surgical Candidate (Up to cT4a), MSS/pMMR or MSI Unknown Therapeutic Status: Preoperative or Nonsurgical Candidate, M0 (Clinical Staging) Histology: Adenocarcinoma Disease Classification: Gastric AJCC N Category: cN0 AJCC M Category: cM0 AJCC 8 Stage Grouping: IIB AJCC T Category: cT3 Microsatellite/Mismatch Repair Status: MSS/pMMR Intent of Therapy: Curative Intent, Discussed with Patient

## 2023-08-31 ENCOUNTER — Other Ambulatory Visit: Payer: Self-pay

## 2023-09-01 NOTE — Assessment & Plan Note (Addendum)
-  cT2N0M0, stage I, MMR normal  - He presented with heartburn.  EGD showed medium size infiltrative, fungating and frond-like villous, partially circumferential mass in the cardia, biopsy showed at least intramucosal adenocarcinoma. -He underwent EUS on August 28, 2023, which showed T2 lesion, with possible tumor invading GE junction. -He had exploratory laparoscope on August 29, 2023, which was negative for peritoneal metastasis, he had a port placement. -I recommend neoadjuvant chemotherapy FLOT every 2 weeks for 4 cycles, followed by surgery. -He has met general surgeon Dr. Leighton Punches, and is also scheduled to see thoracic surgeon Dr. Deloise Ferries also.

## 2023-09-02 ENCOUNTER — Inpatient Hospital Stay (HOSPITAL_BASED_OUTPATIENT_CLINIC_OR_DEPARTMENT_OTHER): Admitting: Hematology

## 2023-09-02 ENCOUNTER — Encounter: Payer: Self-pay | Admitting: Hematology

## 2023-09-02 DIAGNOSIS — C160 Malignant neoplasm of cardia: Secondary | ICD-10-CM
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
## 2023-09-03 ENCOUNTER — Other Ambulatory Visit

## 2023-09-04 ENCOUNTER — Telehealth: Payer: Self-pay

## 2023-09-04 ENCOUNTER — Other Ambulatory Visit: Payer: Self-pay

## 2023-09-04 ENCOUNTER — Encounter: Payer: Self-pay | Admitting: Hematology

## 2023-09-04 ENCOUNTER — Inpatient Hospital Stay

## 2023-09-04 DIAGNOSIS — C160 Malignant neoplasm of cardia: Secondary | ICD-10-CM

## 2023-09-04 NOTE — Progress Notes (Signed)
 The proposed treatment discussed in conference is for discussion purpose only and is not a binding recommendation.  The patients have not been physically examined, or presented with their treatment options.  Therefore, final treatment plans cannot be decided.

## 2023-09-04 NOTE — Telephone Encounter (Signed)
 Transportation has not been set up yet. Pt lives in [place], [place]. Dr. Maryalice Smaller stated that she sent an in basket message to [facility] on 09-03-23 to see if transportation can be arrange. No return message yet to Dr. Maryalice Smaller. [name] agreed to receiving treatment in [facility] vs [place] even if he does not receive transportation. Transportation would be very helpful to patient and family as there are several family members going through serious illnesses at this time. Chemotherapy Education was completed. Note: [name] memory is not very good.  [name] daughter [name] stated that she will try to arrange some rides for her dad with some members of the family so he can get started on treatment.  [name] stated that he is experiencing a lot of acid reflux. His PCP Dr. Vyvyan Sun suggested asking Dr. Maryalice Smaller to recommend a medication as the reflux could be from his cancer. [name] states that he is currently taking Pantoprazole  40 mg tablet.  Told [name] that this information would be sent to Dr. Maryalice Smaller and the nurse navigator to follow up.

## 2023-09-05 ENCOUNTER — Other Ambulatory Visit

## 2023-09-05 ENCOUNTER — Other Ambulatory Visit: Payer: Self-pay

## 2023-09-05 MED ORDER — PANTOPRAZOLE SODIUM 40 MG PO TBEC: 40.0000 mg | DELAYED_RELEASE_TABLET | Freq: Every morning | ORAL | 1 refills | Status: DC

## 2023-09-05 NOTE — Progress Notes (Signed)
 Pharmacist Chemotherapy Monitoring - Initial Assessment    Anticipated start date: 09/12/23   The following has been reviewed per standard work regarding the patient's treatment regimen: The patient's diagnosis, treatment plan and drug doses, and organ/hematologic function Lab orders and baseline tests specific to treatment regimen  The treatment plan start date, drug sequencing, and pre-medications Prior authorization status  Patient's documented medication list, including drug-drug interaction screen and prescriptions for anti-emetics and supportive care specific to the treatment regimen The drug concentrations, fluid compatibility, administration routes, and timing of the medications to be used The patient's access for treatment and lifetime cumulative dose history, if applicable  The patient's medication allergies and previous infusion related reactions, if applicable   Changes made to treatment plan:  N/A  Follow up needed:  N/A  [name], PharmD, MBA

## 2023-09-05 NOTE — Telephone Encounter (Signed)
 Called and spoke to [place], patient's sister.  Informed sister of Dr. Candise Chambers request for patient to increase his pantoprazole  to twice daily.  Sister wrote down information and agreed to relay it to patient.  Sister instructed to have patient contact office if he had any additional questions or concerns.

## 2023-09-10 ENCOUNTER — Encounter: Admitting: Dietician

## 2023-09-12 ENCOUNTER — Inpatient Hospital Stay (HOSPITAL_BASED_OUTPATIENT_CLINIC_OR_DEPARTMENT_OTHER)

## 2023-09-12 ENCOUNTER — Inpatient Hospital Stay: Attending: Hematology

## 2023-09-12 ENCOUNTER — Encounter: Payer: Self-pay | Admitting: Gastroenterology

## 2023-09-12 ENCOUNTER — Encounter: Payer: Self-pay | Admitting: Hematology

## 2023-09-12 ENCOUNTER — Inpatient Hospital Stay: Admitting: Hematology

## 2023-09-12 VITALS — BP 152/75 | HR 61 | Temp 97.7°F | Resp 18 | Ht 71.0 in | Wt 197.2 lb

## 2023-09-12 VITALS — BP 142/78 | HR 62 | Temp 98.2°F | Resp 18

## 2023-09-12 DIAGNOSIS — E878 Other disorders of electrolyte and fluid balance, not elsewhere classified: Secondary | ICD-10-CM | POA: Insufficient documentation

## 2023-09-12 DIAGNOSIS — R630 Anorexia: Secondary | ICD-10-CM | POA: Diagnosis not present

## 2023-09-12 DIAGNOSIS — Z79899 Other long term (current) drug therapy: Secondary | ICD-10-CM | POA: Insufficient documentation

## 2023-09-12 DIAGNOSIS — Z5189 Encounter for other specified aftercare: Secondary | ICD-10-CM | POA: Diagnosis not present

## 2023-09-12 DIAGNOSIS — R413 Other amnesia: Secondary | ICD-10-CM | POA: Insufficient documentation

## 2023-09-12 DIAGNOSIS — E876 Hypokalemia: Secondary | ICD-10-CM | POA: Diagnosis not present

## 2023-09-12 DIAGNOSIS — K5900 Constipation, unspecified: Secondary | ICD-10-CM | POA: Insufficient documentation

## 2023-09-12 DIAGNOSIS — T451X5A Adverse effect of antineoplastic and immunosuppressive drugs, initial encounter: Secondary | ICD-10-CM | POA: Diagnosis not present

## 2023-09-12 DIAGNOSIS — I10 Essential (primary) hypertension: Secondary | ICD-10-CM | POA: Insufficient documentation

## 2023-09-12 DIAGNOSIS — R5383 Other fatigue: Secondary | ICD-10-CM | POA: Diagnosis not present

## 2023-09-12 DIAGNOSIS — N179 Acute kidney failure, unspecified: Secondary | ICD-10-CM | POA: Diagnosis not present

## 2023-09-12 DIAGNOSIS — Z95828 Presence of other vascular implants and grafts: Secondary | ICD-10-CM

## 2023-09-12 DIAGNOSIS — Z5111 Encounter for antineoplastic chemotherapy: Secondary | ICD-10-CM | POA: Diagnosis present

## 2023-09-12 DIAGNOSIS — Z7984 Long term (current) use of oral hypoglycemic drugs: Secondary | ICD-10-CM | POA: Diagnosis not present

## 2023-09-12 DIAGNOSIS — E119 Type 2 diabetes mellitus without complications: Secondary | ICD-10-CM | POA: Insufficient documentation

## 2023-09-12 DIAGNOSIS — R112 Nausea with vomiting, unspecified: Secondary | ICD-10-CM | POA: Diagnosis not present

## 2023-09-12 DIAGNOSIS — C160 Malignant neoplasm of cardia: Secondary | ICD-10-CM | POA: Diagnosis present

## 2023-09-12 DIAGNOSIS — R42 Dizziness and giddiness: Secondary | ICD-10-CM | POA: Diagnosis not present

## 2023-09-12 DIAGNOSIS — G8929 Other chronic pain: Secondary | ICD-10-CM | POA: Insufficient documentation

## 2023-09-12 DIAGNOSIS — R7989 Other specified abnormal findings of blood chemistry: Secondary | ICD-10-CM | POA: Diagnosis not present

## 2023-09-12 DIAGNOSIS — Z809 Family history of malignant neoplasm, unspecified: Secondary | ICD-10-CM | POA: Insufficient documentation

## 2023-09-12 DIAGNOSIS — R000 Tachycardia, unspecified: Secondary | ICD-10-CM | POA: Diagnosis not present

## 2023-09-12 DIAGNOSIS — F17210 Nicotine dependence, cigarettes, uncomplicated: Secondary | ICD-10-CM | POA: Insufficient documentation

## 2023-09-12 DIAGNOSIS — R197 Diarrhea, unspecified: Secondary | ICD-10-CM | POA: Diagnosis not present

## 2023-09-12 DIAGNOSIS — Z79891 Long term (current) use of opiate analgesic: Secondary | ICD-10-CM | POA: Diagnosis not present

## 2023-09-12 DIAGNOSIS — Z8042 Family history of malignant neoplasm of prostate: Secondary | ICD-10-CM | POA: Insufficient documentation

## 2023-09-12 HISTORY — DX: Presence of other vascular implants and grafts: Z95.828

## 2023-09-12 LAB — CMP (CANCER CENTER ONLY)
ALT: 10 U/L (ref 0–44)
AST: 15 U/L (ref 15–41)
Albumin: 3.8 g/dL (ref 3.5–5.0)
Alkaline Phosphatase: 28 U/L — ABNORMAL LOW (ref 38–126)
Anion gap: 5 (ref 5–15)
BUN: 19 mg/dL (ref 8–23)
CO2: 29 mmol/L (ref 22–32)
Calcium: 9.3 mg/dL (ref 8.9–10.3)
Chloride: 104 mmol/L (ref 98–111)
Creatinine: 0.97 mg/dL (ref 0.61–1.24)
GFR, Estimated: >60 mL/min (ref >60)
Glucose, Bld: 111 mg/dL — ABNORMAL HIGH (ref 70–99)
Potassium: 4.1 mmol/L (ref 3.5–5.1)
Sodium: 138 mmol/L (ref 135–145)
Total Bilirubin: 0.6 mg/dL (ref 0.0–1.2)
Total Protein: 6.6 g/dL (ref 6.5–8.1)

## 2023-09-12 LAB — CBC WITH DIFFERENTIAL (CANCER CENTER ONLY)
Abs Immature Granulocytes: 0.05 10*3/uL (ref 0.00–0.07)
Basophils Absolute: 0.1 10*3/uL (ref 0.0–0.1)
Basophils Relative: 1 %
Eosinophils Absolute: 0.7 10*3/uL — ABNORMAL HIGH (ref 0.0–0.5)
Eosinophils Relative: 6 %
HCT: 37.2 % — ABNORMAL LOW (ref 39.0–52.0)
Hemoglobin: 13 g/dL (ref 13.0–17.0)
Immature Granulocytes: 0 %
Lymphocytes Relative: 12 %
Lymphs Abs: 1.4 10*3/uL (ref 0.7–4.0)
MCH: 32.7 pg (ref 26.0–34.0)
MCHC: 34.9 g/dL (ref 30.0–36.0)
MCV: 93.7 fL (ref 80.0–100.0)
Monocytes Absolute: 1.1 10*3/uL — ABNORMAL HIGH (ref 0.1–1.0)
Monocytes Relative: 10 %
Neutro Abs: 8 10*3/uL — ABNORMAL HIGH (ref 1.7–7.7)
Neutrophils Relative %: 71 %
Platelet Count: 230 10*3/uL (ref 150–400)
RBC: 3.97 MIL/uL — ABNORMAL LOW (ref 4.22–5.81)
RDW: 12.7 % (ref 11.5–15.5)
WBC Count: 11.3 10*3/uL — ABNORMAL HIGH (ref 4.0–10.5)
nRBC: 0 % (ref 0.0–0.2)

## 2023-09-12 LAB — CEA (ACCESS): CEA (CHCC): 4.41 ng/mL (ref 0.00–5.00)

## 2023-09-12 MED ORDER — PALONOSETRON HCL INJECTION 0.25 MG/5ML
0.2500 mg | Freq: Once | INTRAVENOUS | Status: AC
  Administered 2023-09-12: 0.25 mg via INTRAVENOUS
  Filled 2023-09-12: qty 5

## 2023-09-12 MED ORDER — DEXTROSE 5 % IV SOLN
INTRAVENOUS | Status: DC
Start: 2023-09-12 — End: 2023-09-12

## 2023-09-12 MED ORDER — DEXAMETHASONE SODIUM PHOSPHATE 10 MG/ML IJ SOLN
10.0000 mg | Freq: Once | INTRAMUSCULAR | Status: AC
  Administered 2023-09-12: 10 mg via INTRAVENOUS
  Filled 2023-09-12: qty 1

## 2023-09-12 MED ORDER — SODIUM CHLORIDE 0.9 % IV SOLN
50.0000 mg/m2 | Freq: Once | INTRAVENOUS | Status: AC
  Administered 2023-09-12: 106 mg via INTRAVENOUS
  Filled 2023-09-12: qty 10.6

## 2023-09-12 MED ORDER — SODIUM CHLORIDE 0.9 % IV SOLN
2600.0000 mg/m2 | INTRAVENOUS | Status: DC
  Administered 2023-09-12: 5000 mg via INTRAVENOUS
  Filled 2023-09-12: qty 100

## 2023-09-12 MED ORDER — OXALIPLATIN CHEMO INJECTION 100 MG/20ML
85.0000 mg/m2 | Freq: Once | INTRAVENOUS | Status: AC
  Administered 2023-09-12: 180 mg via INTRAVENOUS
  Filled 2023-09-12: qty 36

## 2023-09-12 MED ORDER — SODIUM CHLORIDE 0.9% FLUSH
10.0000 mL | Freq: Once | INTRAVENOUS | Status: AC
  Administered 2023-09-12: 10 mL

## 2023-09-12 MED ORDER — LEUCOVORIN CALCIUM INJECTION 350 MG
200.0000 mg/m2 | Freq: Once | INTRAVENOUS | Status: AC
  Administered 2023-09-12: 422 mg via INTRAVENOUS
  Filled 2023-09-12: qty 21.1

## 2023-09-12 NOTE — Assessment & Plan Note (Signed)
-  cT2N0M0, stage I, MMR normal  - He presented with heartburn.  EGD showed medium size infiltrative, fungating and frond-like villous, partially circumferential mass in the cardia, biopsy showed at least intramucosal adenocarcinoma. -He underwent EUS on August 28, 2023, which showed T2 lesion, with possible tumor invading GE junction. -He had exploratory laparoscope on August 29, 2023, which was negative for peritoneal metastasis, he had a port placement. -I recommend neoadjuvant chemotherapy FLOT every 2 weeks for 4 cycles, followed by surgery. -He has met general surgeon Dr. Leighton Punches, and is also scheduled to see thoracic surgeon Dr. Deloise Ferries also.
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
## 2023-09-12 NOTE — Patient Instructions (Signed)
 [facility] - A DEPT OF [facility]  Discharge Instructions: Thank you for choosing [facility] to provide your oncology and hematology care.   If you have a lab appointment with the Cancer Center, please go directly to the Cancer Center and check in at the registration area.   Wear comfortable clothing and clothing appropriate for easy access to any Portacath or PICC line.   We strive to give you quality time with your provider. You may need to reschedule your appointment if you arrive late (15 or more minutes).  Arriving late affects you and other patients whose appointments are after yours.  Also, if you miss three or more appointments without notifying the office, you may be dismissed from the clinic at the provider's discretion.      For prescription refill requests, have your pharmacy contact our office and allow 72 hours for refills to be completed.    Today you received the following chemotherapy and/or immunotherapy agents: docetaxel , oxaliplatin , leucovorin , fluorouracil       To help prevent nausea and vomiting after your treatment, we encourage you to take your nausea medication as directed.  BELOW ARE SYMPTOMS THAT SHOULD BE REPORTED IMMEDIATELY: *FEVER GREATER THAN 100.4 F (38 C) OR HIGHER *CHILLS OR SWEATING *NAUSEA AND VOMITING THAT IS NOT CONTROLLED WITH YOUR NAUSEA MEDICATION *UNUSUAL SHORTNESS OF BREATH *UNUSUAL BRUISING OR BLEEDING *URINARY PROBLEMS (pain or burning when urinating, or frequent urination) *BOWEL PROBLEMS (unusual diarrhea, constipation, pain near the anus) TENDERNESS IN MOUTH AND THROAT WITH OR WITHOUT PRESENCE OF ULCERS (sore throat, sores in mouth, or a toothache) UNUSUAL RASH, SWELLING OR PAIN  UNUSUAL VAGINAL DISCHARGE OR ITCHING   Items with * indicate a potential emergency and should be followed up as soon as possible or go to the Emergency Department if any problems should occur.  Please show the  CHEMOTHERAPY ALERT CARD or IMMUNOTHERAPY ALERT CARD at check-in to the Emergency Department and triage nurse.  Should you have questions after your visit or need to cancel or reschedule your appointment, please contact [facility] - A DEPT OF [facility]  Dept: [phone]  and follow the prompts.  Office hours are 8:00 a.m. to 4:30 p.m. Monday - Friday. Please note that voicemails left after 4:00 p.m. may not be returned until the following business day.  We are closed weekends and major holidays. You have access to a nurse at all times for urgent questions. Please call the main number to the clinic Dept: [phone] and follow the prompts.   For any non-urgent questions, you may also contact your provider using MyChart. We now offer e-Visits for anyone 25 and older to request care online for non-urgent symptoms. For details visit [email or web address].   Also download the MyChart app! Go to the app store, search "MyChart", open the app, select [place], and log in with your MyChart username and password.

## 2023-09-12 NOTE — Progress Notes (Signed)
 PATIENT NAVIGATOR PROGRESS NOTE  Name: [name] Date: 09/12/2023 MRN: [id number]  DOB: February 09, 1956   Reason for visit:  Follow-up Appt with Dr. Maryalice Smaller  Comments:   Met with patient and his daughter this morning to follow-up on his need for Transportation. Patient and daughter stated he has not heard from [employer].  Follow-up email sent to Transportation Coordinator to reach out to patient and his daughter. Patient resides closer to the [facility] location.  Informed patient some of his treatment appts may be able to be scheduled at [facility] location.  Patient has decided to keep his appointments at the [facility] location at this time as long as transportation can be provided.  Instructed patient to let us  know if he changes his mind.  Patient and daughter verbalized understanding.    Time spent counseling/coordinating care:  >60 minutes

## 2023-09-13 ENCOUNTER — Encounter: Payer: Self-pay | Admitting: Thoracic Surgery (Cardiothoracic Vascular Surgery)

## 2023-09-13 ENCOUNTER — Inpatient Hospital Stay: Admitting: Dietician

## 2023-09-13 ENCOUNTER — Encounter: Payer: Self-pay | Admitting: Hematology

## 2023-09-13 ENCOUNTER — Encounter: Payer: Self-pay | Admitting: Gastroenterology

## 2023-09-13 ENCOUNTER — Inpatient Hospital Stay

## 2023-09-13 ENCOUNTER — Ambulatory Visit
Attending: Thoracic Surgery (Cardiothoracic Vascular Surgery) | Admitting: Thoracic Surgery (Cardiothoracic Vascular Surgery)

## 2023-09-13 VITALS — BP 151/81 | HR 66 | Resp 20 | Ht 71.0 in | Wt 195.7 lb

## 2023-09-13 VITALS — BP 151/92 | HR 65 | Temp 98.4°F | Resp 18

## 2023-09-13 DIAGNOSIS — Z5111 Encounter for antineoplastic chemotherapy: Secondary | ICD-10-CM | POA: Diagnosis not present

## 2023-09-13 DIAGNOSIS — C160 Malignant neoplasm of cardia: Secondary | ICD-10-CM

## 2023-09-13 MED ORDER — HEPARIN SOD (PORK) LOCK FLUSH 100 UNIT/ML IV SOLN
500.0000 [IU] | Freq: Once | INTRAVENOUS | Status: AC | PRN
  Administered 2023-09-13: 500 [IU]

## 2023-09-13 MED ORDER — PEGFILGRASTIM-JMDB 6 MG/0.6ML ~~LOC~~ SOSY
6.0000 mg | PREFILLED_SYRINGE | Freq: Once | SUBCUTANEOUS | Status: AC
  Administered 2023-09-13: 6 mg via SUBCUTANEOUS
  Filled 2023-09-13: qty 0.6

## 2023-09-13 MED ORDER — SODIUM CHLORIDE 0.9% FLUSH
10.0000 mL | INTRAVENOUS | Status: DC | PRN
Start: 2023-09-13 — End: 2023-09-13
  Administered 2023-09-13: 10 mL

## 2023-09-13 NOTE — Telephone Encounter (Signed)
-----   Message from Nurse Autry Boas sent at 09/12/2023  3:55 PM EDT ----- Regarding: First time FLOT Dr. Maryalice Smaller pt First time FLOT Dr. Maryalice Smaller pt  Tolerated well.

## 2023-09-13 NOTE — Telephone Encounter (Signed)
 Called pt to see how he did with his first treatment.  He reports doing well except some tingling in hand/fingers.  Encouraged to wash hands frequently & apply moisturizers & keep warm. He is is coming in for pump d/c today & pegfilgrastim .  Reminded about claritin/tylenol  usage.  He knows how to reach us  if needed & knows reasons to call.
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
## 2023-09-14 ENCOUNTER — Encounter

## 2023-09-14 ENCOUNTER — Ambulatory Visit

## 2023-09-16 NOTE — Assessment & Plan Note (Signed)
-  cT2N0M0, stage I, MMR normal  - He presented with heartburn.  EGD showed medium size infiltrative, fungating and frond-like villous, partially circumferential mass in the cardia, biopsy showed at least intramucosal adenocarcinoma. -He underwent EUS on August 28, 2023, which showed T2 lesion, with possible tumor invading GE junction. -He had exploratory laparoscope on August 29, 2023, which was negative for peritoneal metastasis, he had a port placement. -I recommend neoadjuvant chemotherapy FLOT every 2 weeks for 4 cycles, followed by surgery. He started on 09/12/2023 -He has met general surgeon Dr. Leighton Punches, and is also scheduled to see thoracic surgeon Dr. Deloise Ferries also.

## 2023-09-17 ENCOUNTER — Inpatient Hospital Stay (HOSPITAL_BASED_OUTPATIENT_CLINIC_OR_DEPARTMENT_OTHER): Admitting: Hematology

## 2023-09-17 DIAGNOSIS — Z5111 Encounter for antineoplastic chemotherapy: Secondary | ICD-10-CM | POA: Diagnosis not present

## 2023-09-17 DIAGNOSIS — C160 Malignant neoplasm of cardia: Secondary | ICD-10-CM
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
## 2023-09-23 ENCOUNTER — Telehealth: Payer: Self-pay | Admitting: Hematology

## 2023-09-24 ENCOUNTER — Other Ambulatory Visit: Payer: Self-pay

## 2023-09-24 ENCOUNTER — Other Ambulatory Visit: Payer: Self-pay | Admitting: *Deleted

## 2023-09-24 ENCOUNTER — Inpatient Hospital Stay: Admitting: Licensed Clinical Social Worker

## 2023-09-24 ENCOUNTER — Inpatient Hospital Stay (HOSPITAL_BASED_OUTPATIENT_CLINIC_OR_DEPARTMENT_OTHER): Admitting: Physician Assistant

## 2023-09-24 ENCOUNTER — Inpatient Hospital Stay (HOSPITAL_BASED_OUTPATIENT_CLINIC_OR_DEPARTMENT_OTHER)

## 2023-09-24 VITALS — BP 121/76 | HR 112 | Temp 97.7°F | Resp 18 | Wt 188.3 lb

## 2023-09-24 VITALS — BP 114/80 | HR 110

## 2023-09-24 DIAGNOSIS — F332 Major depressive disorder, recurrent severe without psychotic features: Secondary | ICD-10-CM | POA: Insufficient documentation

## 2023-09-24 DIAGNOSIS — C160 Malignant neoplasm of cardia: Secondary | ICD-10-CM

## 2023-09-24 DIAGNOSIS — B1920 Unspecified viral hepatitis C without hepatic coma: Secondary | ICD-10-CM

## 2023-09-24 DIAGNOSIS — E7800 Pure hypercholesterolemia, unspecified: Secondary | ICD-10-CM | POA: Insufficient documentation

## 2023-09-24 DIAGNOSIS — R112 Nausea with vomiting, unspecified: Secondary | ICD-10-CM

## 2023-09-24 DIAGNOSIS — F17210 Nicotine dependence, cigarettes, uncomplicated: Secondary | ICD-10-CM | POA: Insufficient documentation

## 2023-09-24 DIAGNOSIS — R197 Diarrhea, unspecified: Secondary | ICD-10-CM

## 2023-09-24 DIAGNOSIS — H35039 Hypertensive retinopathy, unspecified eye: Secondary | ICD-10-CM

## 2023-09-24 DIAGNOSIS — N200 Calculus of kidney: Secondary | ICD-10-CM | POA: Insufficient documentation

## 2023-09-24 DIAGNOSIS — K219 Gastro-esophageal reflux disease without esophagitis: Secondary | ICD-10-CM | POA: Insufficient documentation

## 2023-09-24 DIAGNOSIS — E1165 Type 2 diabetes mellitus with hyperglycemia: Secondary | ICD-10-CM

## 2023-09-24 DIAGNOSIS — F325 Major depressive disorder, single episode, in full remission: Secondary | ICD-10-CM

## 2023-09-24 DIAGNOSIS — I10 Essential (primary) hypertension: Secondary | ICD-10-CM

## 2023-09-24 DIAGNOSIS — M5126 Other intervertebral disc displacement, lumbar region: Secondary | ICD-10-CM | POA: Insufficient documentation

## 2023-09-24 DIAGNOSIS — Z800 Family history of malignant neoplasm of digestive organs: Secondary | ICD-10-CM | POA: Insufficient documentation

## 2023-09-24 DIAGNOSIS — E1121 Type 2 diabetes mellitus with diabetic nephropathy: Secondary | ICD-10-CM | POA: Insufficient documentation

## 2023-09-24 DIAGNOSIS — K5903 Drug induced constipation: Secondary | ICD-10-CM | POA: Insufficient documentation

## 2023-09-24 DIAGNOSIS — Z860101 Personal history of adenomatous and serrated colon polyps: Secondary | ICD-10-CM

## 2023-09-24 DIAGNOSIS — I2510 Atherosclerotic heart disease of native coronary artery without angina pectoris: Secondary | ICD-10-CM | POA: Insufficient documentation

## 2023-09-24 DIAGNOSIS — B009 Herpesviral infection, unspecified: Secondary | ICD-10-CM | POA: Insufficient documentation

## 2023-09-24 DIAGNOSIS — R7989 Other specified abnormal findings of blood chemistry: Secondary | ICD-10-CM | POA: Diagnosis not present

## 2023-09-24 DIAGNOSIS — I700 Atherosclerosis of aorta: Secondary | ICD-10-CM

## 2023-09-24 DIAGNOSIS — J449 Chronic obstructive pulmonary disease, unspecified: Secondary | ICD-10-CM | POA: Insufficient documentation

## 2023-09-24 DIAGNOSIS — R894 Abnormal immunological findings in specimens from other organs, systems and tissues: Secondary | ICD-10-CM | POA: Insufficient documentation

## 2023-09-24 DIAGNOSIS — J439 Emphysema, unspecified: Secondary | ICD-10-CM | POA: Insufficient documentation

## 2023-09-24 DIAGNOSIS — K5730 Diverticulosis of large intestine without perforation or abscess without bleeding: Secondary | ICD-10-CM

## 2023-09-24 DIAGNOSIS — G4700 Insomnia, unspecified: Secondary | ICD-10-CM

## 2023-09-24 DIAGNOSIS — Z5111 Encounter for antineoplastic chemotherapy: Secondary | ICD-10-CM | POA: Diagnosis not present

## 2023-09-24 DIAGNOSIS — F320 Major depressive disorder, single episode, mild: Secondary | ICD-10-CM | POA: Insufficient documentation

## 2023-09-24 DIAGNOSIS — N3281 Overactive bladder: Secondary | ICD-10-CM | POA: Insufficient documentation

## 2023-09-24 HISTORY — DX: Abnormal immunological findings in specimens from other organs, systems and tissues: R89.4

## 2023-09-24 HISTORY — DX: Atherosclerotic heart disease of native coronary artery without angina pectoris: I25.10

## 2023-09-24 HISTORY — DX: Insomnia, unspecified: G47.00

## 2023-09-24 HISTORY — DX: Chronic obstructive pulmonary disease, unspecified: J44.9

## 2023-09-24 HISTORY — DX: Unspecified viral hepatitis C without hepatic coma: B19.20

## 2023-09-24 HISTORY — DX: Pure hypercholesterolemia, unspecified: E78.00

## 2023-09-24 HISTORY — DX: Personal history of adenomatous and serrated colon polyps: Z86.0101

## 2023-09-24 HISTORY — DX: Nicotine dependence, cigarettes, uncomplicated: F17.210

## 2023-09-24 HISTORY — DX: Type 2 diabetes mellitus with diabetic nephropathy: E11.21

## 2023-09-24 HISTORY — DX: Diverticulosis of large intestine without perforation or abscess without bleeding: K57.30

## 2023-09-24 HISTORY — DX: Major depressive disorder, single episode, in full remission: F32.5

## 2023-09-24 HISTORY — DX: Essential (primary) hypertension: I10

## 2023-09-24 HISTORY — DX: Hypertensive retinopathy, unspecified eye: H35.039

## 2023-09-24 HISTORY — DX: Calculus of kidney: N20.0

## 2023-09-24 HISTORY — DX: Herpesviral infection, unspecified: B00.9

## 2023-09-24 HISTORY — DX: Gastro-esophageal reflux disease without esophagitis: K21.9

## 2023-09-24 HISTORY — DX: Atherosclerosis of aorta: I70.0

## 2023-09-24 HISTORY — DX: Emphysema, unspecified: J43.9

## 2023-09-24 HISTORY — DX: Drug induced constipation: K59.03

## 2023-09-24 HISTORY — DX: Other intervertebral disc displacement, lumbar region: M51.26

## 2023-09-24 HISTORY — DX: Family history of malignant neoplasm of digestive organs: Z80.0

## 2023-09-24 HISTORY — DX: Type 2 diabetes mellitus with hyperglycemia: E11.65

## 2023-09-24 HISTORY — DX: Malignant neoplasm of cardia: C16.0

## 2023-09-24 LAB — CBC WITH DIFFERENTIAL (CANCER CENTER ONLY)
Abs Immature Granulocytes: 0.11 10*3/uL — ABNORMAL HIGH (ref 0.00–0.07)
Basophils Absolute: 0.1 10*3/uL (ref 0.0–0.1)
Basophils Relative: 0 %
Eosinophils Absolute: 0 10*3/uL (ref 0.0–0.5)
Eosinophils Relative: 0 %
HCT: 40 % (ref 39.0–52.0)
Hemoglobin: 14.6 g/dL (ref 13.0–17.0)
Immature Granulocytes: 1 %
Lymphocytes Relative: 8 %
Lymphs Abs: 1.2 10*3/uL (ref 0.7–4.0)
MCH: 32.8 pg (ref 26.0–34.0)
MCHC: 36.5 g/dL — ABNORMAL HIGH (ref 30.0–36.0)
MCV: 89.9 fL (ref 80.0–100.0)
Monocytes Absolute: 1.6 10*3/uL — ABNORMAL HIGH (ref 0.1–1.0)
Monocytes Relative: 11 %
Neutro Abs: 11.4 10*3/uL — ABNORMAL HIGH (ref 1.7–7.7)
Neutrophils Relative %: 80 %
Platelet Count: 197 10*3/uL (ref 150–400)
RBC: 4.45 MIL/uL (ref 4.22–5.81)
RDW: 12.7 % (ref 11.5–15.5)
WBC Count: 14.4 10*3/uL — ABNORMAL HIGH (ref 4.0–10.5)
nRBC: 0 % (ref 0.0–0.2)

## 2023-09-24 LAB — CMP (CANCER CENTER ONLY)
ALT: 19 U/L (ref 0–44)
AST: 22 U/L (ref 15–41)
Albumin: 3.3 g/dL — ABNORMAL LOW (ref 3.5–5.0)
Alkaline Phosphatase: 37 U/L — ABNORMAL LOW (ref 38–126)
Anion gap: 12 (ref 5–15)
BUN: 54 mg/dL — ABNORMAL HIGH (ref 8–23)
CO2: 28 mmol/L (ref 22–32)
Calcium: 8.7 mg/dL — ABNORMAL LOW (ref 8.9–10.3)
Chloride: 94 mmol/L — ABNORMAL LOW (ref 98–111)
Creatinine: 1.65 mg/dL — ABNORMAL HIGH (ref 0.61–1.24)
GFR, Estimated: 45 mL/min — ABNORMAL LOW (ref >60)
Glucose, Bld: 155 mg/dL — ABNORMAL HIGH (ref 70–99)
Potassium: 3.3 mmol/L — ABNORMAL LOW (ref 3.5–5.1)
Sodium: 134 mmol/L — ABNORMAL LOW (ref 135–145)
Total Bilirubin: 0.4 mg/dL (ref 0.0–1.2)
Total Protein: 6.2 g/dL — ABNORMAL LOW (ref 6.5–8.1)

## 2023-09-24 LAB — MAGNESIUM: Magnesium: 1.8 mg/dL (ref 1.7–2.4)

## 2023-09-24 MED ORDER — POTASSIUM CHLORIDE 10 MEQ/100ML IV SOLN
10.0000 meq | Freq: Once | INTRAVENOUS | Status: AC
  Administered 2023-09-24: 10 meq via INTRAVENOUS
  Filled 2023-09-24: qty 100

## 2023-09-24 MED ORDER — SODIUM CHLORIDE 0.9 % IV SOLN: Freq: Once | INTRAVENOUS | Status: AC

## 2023-09-24 MED ORDER — LOPERAMIDE HCL 2 MG PO CAPS
4.0000 mg | ORAL_CAPSULE | Freq: Once | ORAL | Status: AC
  Administered 2023-09-24: 4 mg via ORAL
  Filled 2023-09-24: qty 2

## 2023-09-24 MED ORDER — LOPERAMIDE HCL 2 MG PO CAPS
2.0000 mg | ORAL_CAPSULE | ORAL | Status: DC | PRN
  Administered 2023-09-24: 2 mg via ORAL
  Filled 2023-09-24: qty 1

## 2023-09-24 MED ORDER — ONDANSETRON HCL 4 MG/2ML IJ SOLN
4.0000 mg | Freq: Once | INTRAMUSCULAR | Status: AC
  Administered 2023-09-24: 4 mg via INTRAVENOUS
  Filled 2023-09-24: qty 2

## 2023-09-24 MED ORDER — HEPARIN SOD (PORK) LOCK FLUSH 100 UNIT/ML IV SOLN
500.0000 [IU] | Freq: Once | INTRAVENOUS | Status: AC
  Administered 2023-09-24: 500 [IU] via INTRAVENOUS

## 2023-09-24 MED ORDER — SODIUM CHLORIDE 0.9% FLUSH
10.0000 mL | Freq: Once | INTRAVENOUS | Status: AC
  Administered 2023-09-24: 10 mL via INTRAVENOUS

## 2023-09-24 NOTE — Progress Notes (Signed)
 This RN educated Pt and Pt's daughter on how to take Imodium, Compazine , & Zofran  at home. Pt was provided a handout with images of each medication and information on how to take. This RN described in detail how to take these medications. Patient able to teach back to this RN when and how to take meds. This RN educated Pt to call with any questions on how to take medications prior to taking them. Pt verbalized understanding and was agreeable with plan. Pt scheduled for f/u in [facility] on Thursday 09/26/2023 at 12:00 PM Pt aware of appointment and stated "I will be here".

## 2023-09-24 NOTE — Patient Instructions (Addendum)
 1) Take Imodium for diarrhea. Take 2 pills after the first episode of diarrhea then than 1 capsule  after each loose stool. Maximum is 8 capsules per day (16 mg)         2)Take Prochlorperazine  (compazine ) 10 mg for nausea every 6 hours.          3)Take ondansetron  (ZOFRAN ) 8 MG tablet every 8 hours for nausea.

## 2023-09-24 NOTE — Progress Notes (Signed)
 Late Entry:  Received Secure Chat from Jon Carolin, RN stating the following:  Happy Monday, [name] lol. This patient left a VM on Dr. Joseph Velasquez line, stating he has been unable to keep down POs since starting chemo...reported vomiting and diarrhea.    Sent message to [name] to contact pt to get him scheduled to be seen in [facility].  [name] called pt but pt stated he would have to check with his daughter to see if she could bring him in to be seen on 09/24/2023 in [facility].  Pt called [name] back with availability and is scheduled for [facility] at 1030 on 09/24/2023.
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
## 2023-09-24 NOTE — Patient Instructions (Signed)
 Nausea and Vomiting, Adult Nausea is feeling that you have an upset stomach and that you are about to vomit. Vomiting is when food in your stomach forcefully comes out of your mouth. Vomiting can make you feel weak. If you vomit, or if you are not able to drink enough fluids, you may not have enough water in your body (get dehydrated). If you do not have enough water in your body, you may: Feel tired. Feel thirsty. Have a dry mouth. Have cracked lips. Pee (urinate) less often. Older adults and people with other diseases or a weak body defense system (immune system) are at higher risk for not having enough water in the body. If you feel like you may vomit or you vomit, it is important to follow instructions from your doctor about how to take care of yourself. Follow these instructions at home: Watch your symptoms for any changes. Tell your doctor about them. Eating and drinking     Take an ORS (oral rehydration solution). This is a drink that is sold at pharmacies and stores. Drink clear fluids in small amounts as you are able, such as: Water. Ice chips. Fruit juice that has water added (diluted fruit juice). Low-calorie sports drinks. Eat bland, easy-to-digest foods in small amounts as you are able, such as: Bananas. Applesauce. Rice. Low-fat (lean) meats. Toast. Crackers. Avoid drinking fluids that have a lot of sugar or caffeine in them. This includes energy drinks, sports drinks, and soda. Avoid alcohol. Avoid spicy or fatty foods. General instructions Take over-the-counter and prescription medicines only as told by your doctor. Drink enough fluid to keep your pee (urine) pale yellow. Wash your hands often with soap and water for at least 20 seconds. If you cannot use soap and water, use hand sanitizer. Make sure that everyone in your home washes their hands well and often. Rest at home until you feel better. Watch your condition for any changes. Take slow and deep breaths  when you feel like you may vomit. Keep all follow-up visits. Contact a doctor if: Your symptoms get worse. You have new symptoms. You have a fever. You cannot drink fluids without vomiting. You feel like you may vomit for more than 2 days. You feel light-headed or dizzy. You have a headache. You have muscle cramps. You have a rash. You have pain while peeing. Get help right away if: You have pain in your chest, neck, arm, or jaw. You feel very weak or you faint. You vomit again and again. You have vomit that is bright red or looks like black coffee grounds. You have bloody or black poop (stools) or poop that looks like tar. You have a very bad headache, a stiff neck, or both. You have very bad pain, cramping, or bloating in your belly (abdomen). You have trouble breathing. You are breathing very quickly. Your heart is beating very quickly. Your skin feels cold and clammy. You feel confused. You have signs of losing too much water in your body, such as: Dark pee, very little pee, or no pee. Cracked lips. Dry mouth. Sunken eyes. Sleepiness. Weakness. These symptoms may be an emergency. Get help right away. Call 911. Do not wait to see if the symptoms will go away. Do not drive yourself to the hospital. Summary Nausea is feeling that you have an upset stomach and that you are about to vomit. Vomiting is when food in your stomach comes out of your mouth. Follow instructions from your doctor about eating and drinking.  Take over-the-counter and prescription medicines only as told by your doctor. Contact your doctor if your symptoms get worse or you have new symptoms. Keep all follow-up visits. This information is not intended to replace advice given to you by your health care provider. Make sure you discuss any questions you have with your health care provider. Diarrhea, Adult Diarrhea is when you pass loose and sometimes watery poop (stool) often. Diarrhea can make you feel  weak and cause you to lose water in your body (get dehydrated). Losing water in your body can cause you to: Feel tired and thirsty. Have a dry mouth. Go pee (urinate) less often. Diarrhea often lasts 2-3 days. It can last longer if it is a sign of something more serious. Be sure to treat your diarrhea as told by your doctor. Follow these instructions at home: Eating and drinking     Follow these instructions as told by your doctor: Take an ORS (oral rehydration solution). This is a drink that helps you replace fluids and minerals your body lost. It is sold at pharmacies and stores. Drink enough fluid to keep your pee (urine) pale yellow. Drink fluids such as: Water. You can also get fluids by sucking on ice chips. Diluted fruit juice. Low-calorie sports drinks. Milk. Avoid drinking fluids that have a lot of sugar or caffeine in them. These include soda, energy drinks, and regular sports drinks. Avoid alcohol. Eat bland, easy-to-digest foods in small amounts as you are able. These foods include: Bananas. Applesauce. Rice. Low-fat (lean) meats. Toast. Crackers. Avoid spicy or fatty foods.  Medicines Take over-the-counter and prescription medicines only as told by your doctor. If you were prescribed antibiotics, take them as told by your doctor. Do not stop taking them even if you start to feel better. General instructions  Wash your hands often using soap and water for 20 seconds. If soap and water are not available, use hand sanitizer. Others in your home should wash their hands as well. Wash your hands: After using the toilet or changing a diaper. Before preparing, cooking, or serving food. While caring for a sick person. While visiting someone in a hospital. Rest at home while you get better. Take a warm bath to help with any burning or pain from having diarrhea. Watch your condition for any changes. Contact a doctor if: You have a fever. Your diarrhea gets worse. You  have new symptoms. You vomit every time you eat or drink. You feel light-headed, dizzy, or you have a headache. You have muscle cramps. You have signs of losing too much water in your body, such as: Dark pee, very little pee, or no pee. Cracked lips. Dry mouth. Sunken eyes. Sleepiness. Weakness. You have bloody or black poop or poop that looks like tar. You have very bad pain, cramping, or bloating in your belly (abdomen). Your skin feels cold and clammy. You feel confused. Get help right away if: You have chest pain. Your heart is beating very quickly. You have trouble breathing or you are breathing very quickly. You feel very weak or you faint. These symptoms may be an emergency. Get help right away. Call 911. Do not wait to see if the symptoms will go away. Do not drive yourself to the hospital. This information is not intended to replace advice given to you by your health care provider. Make sure you discuss any questions you have with your health care provider.

## 2023-09-26 ENCOUNTER — Other Ambulatory Visit (HOSPITAL_COMMUNITY): Payer: Self-pay

## 2023-09-26 ENCOUNTER — Inpatient Hospital Stay (HOSPITAL_BASED_OUTPATIENT_CLINIC_OR_DEPARTMENT_OTHER): Admitting: Physician Assistant

## 2023-09-26 ENCOUNTER — Ambulatory Visit

## 2023-09-26 ENCOUNTER — Encounter: Payer: Self-pay | Admitting: Hematology

## 2023-09-26 ENCOUNTER — Inpatient Hospital Stay

## 2023-09-26 ENCOUNTER — Ambulatory Visit: Admitting: Hematology

## 2023-09-26 VITALS — BP 112/74 | HR 71 | Temp 97.5°F | Resp 18 | Ht 71.0 in | Wt 188.1 lb

## 2023-09-26 VITALS — BP 115/73 | HR 76 | Temp 97.5°F | Resp 18 | Wt 188.0 lb

## 2023-09-26 DIAGNOSIS — E876 Hypokalemia: Secondary | ICD-10-CM

## 2023-09-26 DIAGNOSIS — R112 Nausea with vomiting, unspecified: Secondary | ICD-10-CM

## 2023-09-26 DIAGNOSIS — Z95828 Presence of other vascular implants and grafts: Secondary | ICD-10-CM

## 2023-09-26 DIAGNOSIS — C160 Malignant neoplasm of cardia: Secondary | ICD-10-CM

## 2023-09-26 DIAGNOSIS — Z5111 Encounter for antineoplastic chemotherapy: Secondary | ICD-10-CM | POA: Diagnosis not present

## 2023-09-26 DIAGNOSIS — R197 Diarrhea, unspecified: Secondary | ICD-10-CM

## 2023-09-26 LAB — CMP (CANCER CENTER ONLY)
ALT: 21 U/L (ref 0–44)
AST: 15 U/L (ref 15–41)
Albumin: 3.1 g/dL — ABNORMAL LOW (ref 3.5–5.0)
Alkaline Phosphatase: 30 U/L — ABNORMAL LOW (ref 38–126)
Anion gap: 6 (ref 5–15)
BUN: 31 mg/dL — ABNORMAL HIGH (ref 8–23)
CO2: 34 mmol/L — ABNORMAL HIGH (ref 22–32)
Calcium: 8.4 mg/dL — ABNORMAL LOW (ref 8.9–10.3)
Chloride: 93 mmol/L — ABNORMAL LOW (ref 98–111)
Creatinine: 1.16 mg/dL (ref 0.61–1.24)
GFR, Estimated: >60 mL/min (ref >60)
Glucose, Bld: 136 mg/dL — ABNORMAL HIGH (ref 70–99)
Potassium: 3.1 mmol/L — ABNORMAL LOW (ref 3.5–5.1)
Sodium: 133 mmol/L — ABNORMAL LOW (ref 135–145)
Total Bilirubin: 0.5 mg/dL (ref 0.0–1.2)
Total Protein: 5.6 g/dL — ABNORMAL LOW (ref 6.5–8.1)

## 2023-09-26 LAB — CBC WITH DIFFERENTIAL (CANCER CENTER ONLY)
Abs Immature Granulocytes: 0.1 10*3/uL — ABNORMAL HIGH (ref 0.00–0.07)
Basophils Absolute: 0 10*3/uL (ref 0.0–0.1)
Basophils Relative: 0 %
Eosinophils Absolute: 0 10*3/uL (ref 0.0–0.5)
Eosinophils Relative: 0 %
HCT: 36.5 % — ABNORMAL LOW (ref 39.0–52.0)
Hemoglobin: 13.6 g/dL (ref 13.0–17.0)
Immature Granulocytes: 1 %
Lymphocytes Relative: 13 %
Lymphs Abs: 1.5 10*3/uL (ref 0.7–4.0)
MCH: 33.2 pg (ref 26.0–34.0)
MCHC: 37.3 g/dL — ABNORMAL HIGH (ref 30.0–36.0)
MCV: 89 fL (ref 80.0–100.0)
Monocytes Absolute: 1.4 10*3/uL — ABNORMAL HIGH (ref 0.1–1.0)
Monocytes Relative: 13 %
Neutro Abs: 8 10*3/uL — ABNORMAL HIGH (ref 1.7–7.7)
Neutrophils Relative %: 73 %
Platelet Count: 224 10*3/uL (ref 150–400)
RBC: 4.1 MIL/uL — ABNORMAL LOW (ref 4.22–5.81)
RDW: 12.6 % (ref 11.5–15.5)
WBC Count: 11 10*3/uL — ABNORMAL HIGH (ref 4.0–10.5)
nRBC: 0 % (ref 0.0–0.2)

## 2023-09-26 LAB — MAGNESIUM: Magnesium: 1.6 mg/dL — ABNORMAL LOW (ref 1.7–2.4)

## 2023-09-26 MED ORDER — SODIUM CHLORIDE 0.9 % IV SOLN: Freq: Once | INTRAVENOUS | Status: AC

## 2023-09-26 MED ORDER — POTASSIUM CHLORIDE CRYS ER 20 MEQ PO TBCR
20.0000 meq | EXTENDED_RELEASE_TABLET | Freq: Two times a day (BID) | ORAL | 0 refills | Status: DC
Start: 2023-09-27 — End: 2023-10-19
  Filled 2023-09-26: qty 14, 7d supply, fill #0

## 2023-09-26 MED ORDER — HEPARIN SOD (PORK) LOCK FLUSH 100 UNIT/ML IV SOLN
500.0000 [IU] | Freq: Once | INTRAVENOUS | Status: AC
  Administered 2023-09-26: 500 [IU]

## 2023-09-26 MED ORDER — POTASSIUM CHLORIDE 10 MEQ/100ML IV SOLN
10.0000 meq | INTRAVENOUS | Status: AC
  Administered 2023-09-26 (×2): 10 meq via INTRAVENOUS
  Filled 2023-09-26: qty 100

## 2023-09-26 MED ORDER — SODIUM CHLORIDE 0.9% FLUSH
10.0000 mL | Freq: Once | INTRAVENOUS | Status: AC
  Administered 2023-09-26: 10 mL

## 2023-09-26 MED ORDER — POTASSIUM CHLORIDE CRYS ER 20 MEQ PO TBCR
40.0000 meq | EXTENDED_RELEASE_TABLET | Freq: Once | ORAL | Status: AC
  Administered 2023-09-26: 40 meq via ORAL
  Filled 2023-09-26: qty 2

## 2023-09-26 MED ORDER — FAMOTIDINE IN NACL 20-0.9 MG/50ML-% IV SOLN
20.0000 mg | Freq: Once | INTRAVENOUS | Status: AC
Start: 2023-09-26 — End: 2023-09-26
  Administered 2023-09-26: 20 mg via INTRAVENOUS
  Filled 2023-09-26: qty 50

## 2023-09-26 MED ORDER — MAGNESIUM SULFATE 2 GM/50ML IV SOLN
2.0000 g | Freq: Once | INTRAVENOUS | Status: AC
  Administered 2023-09-26: 2 g via INTRAVENOUS
  Filled 2023-09-26: qty 50

## 2023-09-26 MED ORDER — ONDANSETRON HCL 4 MG/2ML IJ SOLN
8.0000 mg | Freq: Once | INTRAMUSCULAR | Status: AC
  Administered 2023-09-26: 8 mg via INTRAVENOUS
  Filled 2023-09-26: qty 4
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
## 2023-09-27 ENCOUNTER — Encounter

## 2023-09-28 ENCOUNTER — Ambulatory Visit

## 2023-10-10 ENCOUNTER — Encounter: Payer: Self-pay | Admitting: Nurse Practitioner

## 2023-10-10 ENCOUNTER — Inpatient Hospital Stay: Admitting: Nutrition

## 2023-10-10 ENCOUNTER — Inpatient Hospital Stay: Admitting: Licensed Clinical Social Worker

## 2023-10-10 ENCOUNTER — Inpatient Hospital Stay (HOSPITAL_BASED_OUTPATIENT_CLINIC_OR_DEPARTMENT_OTHER): Admitting: Nurse Practitioner

## 2023-10-10 ENCOUNTER — Inpatient Hospital Stay

## 2023-10-10 VITALS — BP 138/80 | HR 65 | Temp 97.9°F | Resp 17 | Wt 191.1 lb

## 2023-10-10 DIAGNOSIS — Z5111 Encounter for antineoplastic chemotherapy: Secondary | ICD-10-CM | POA: Diagnosis not present

## 2023-10-10 DIAGNOSIS — Z95828 Presence of other vascular implants and grafts: Secondary | ICD-10-CM

## 2023-10-10 DIAGNOSIS — C160 Malignant neoplasm of cardia: Secondary | ICD-10-CM

## 2023-10-10 LAB — CBC WITH DIFFERENTIAL (CANCER CENTER ONLY)
Abs Immature Granulocytes: 0.04 10*3/uL (ref 0.00–0.07)
Basophils Absolute: 0.1 10*3/uL (ref 0.0–0.1)
Basophils Relative: 1 %
Eosinophils Absolute: 0.7 10*3/uL — ABNORMAL HIGH (ref 0.0–0.5)
Eosinophils Relative: 5 %
HCT: 35.3 % — ABNORMAL LOW (ref 39.0–52.0)
Hemoglobin: 12.3 g/dL — ABNORMAL LOW (ref 13.0–17.0)
Immature Granulocytes: 0 %
Lymphocytes Relative: 13 %
Lymphs Abs: 1.7 10*3/uL (ref 0.7–4.0)
MCH: 33.3 pg (ref 26.0–34.0)
MCHC: 34.8 g/dL (ref 30.0–36.0)
MCV: 95.7 fL (ref 80.0–100.0)
Monocytes Absolute: 1.1 10*3/uL — ABNORMAL HIGH (ref 0.1–1.0)
Monocytes Relative: 9 %
Neutro Abs: 8.8 10*3/uL — ABNORMAL HIGH (ref 1.7–7.7)
Neutrophils Relative %: 72 %
Platelet Count: 299 10*3/uL (ref 150–400)
RBC: 3.69 MIL/uL — ABNORMAL LOW (ref 4.22–5.81)
RDW: 14 % (ref 11.5–15.5)
WBC Count: 12.4 10*3/uL — ABNORMAL HIGH (ref 4.0–10.5)
nRBC: 0 % (ref 0.0–0.2)

## 2023-10-10 LAB — CMP (CANCER CENTER ONLY)
ALT: 11 U/L (ref 0–44)
AST: 15 U/L (ref 15–41)
Albumin: 3.3 g/dL — ABNORMAL LOW (ref 3.5–5.0)
Alkaline Phosphatase: 33 U/L — ABNORMAL LOW (ref 38–126)
Anion gap: 4 — ABNORMAL LOW (ref 5–15)
BUN: 11 mg/dL (ref 8–23)
CO2: 30 mmol/L (ref 22–32)
Calcium: 9 mg/dL (ref 8.9–10.3)
Chloride: 104 mmol/L (ref 98–111)
Creatinine: 0.85 mg/dL (ref 0.61–1.24)
GFR, Estimated: >60 mL/min (ref >60)
Glucose, Bld: 150 mg/dL — ABNORMAL HIGH (ref 70–99)
Potassium: 4.2 mmol/L (ref 3.5–5.1)
Sodium: 138 mmol/L (ref 135–145)
Total Bilirubin: 0.5 mg/dL (ref 0.0–1.2)
Total Protein: 5.9 g/dL — ABNORMAL LOW (ref 6.5–8.1)

## 2023-10-10 LAB — CEA (ACCESS): CEA (CHCC): 4.8 ng/mL (ref 0.00–5.00)

## 2023-10-10 MED ORDER — SODIUM CHLORIDE 0.9% FLUSH
10.0000 mL | Freq: Once | INTRAVENOUS | Status: AC
  Administered 2023-10-10: 10 mL

## 2023-10-10 MED ORDER — LEUCOVORIN CALCIUM INJECTION 350 MG
200.0000 mg/m2 | Freq: Once | INTRAVENOUS | Status: AC
  Administered 2023-10-10: 422 mg via INTRAVENOUS
  Filled 2023-10-10: qty 21.1

## 2023-10-10 MED ORDER — PALONOSETRON HCL INJECTION 0.25 MG/5ML
0.2500 mg | Freq: Once | INTRAVENOUS | Status: AC
  Administered 2023-10-10: 0.25 mg via INTRAVENOUS
  Filled 2023-10-10: qty 5

## 2023-10-10 MED ORDER — ONDANSETRON 8 MG PO TBDP: 8.0000 mg | ORAL_TABLET | Freq: Three times a day (TID) | ORAL | 1 refills | Status: DC | PRN

## 2023-10-10 MED ORDER — DEXTROSE 5 % IV SOLN: INTRAVENOUS | Status: DC

## 2023-10-10 MED ORDER — OXALIPLATIN CHEMO INJECTION 100 MG/20ML
85.0000 mg/m2 | Freq: Once | INTRAVENOUS | Status: AC
  Administered 2023-10-10: 180 mg via INTRAVENOUS
  Filled 2023-10-10: qty 36

## 2023-10-10 MED ORDER — SODIUM CHLORIDE 0.9 % IV SOLN
2600.0000 mg/m2 | INTRAVENOUS | Status: DC
  Administered 2023-10-10: 5000 mg via INTRAVENOUS
  Filled 2023-10-10: qty 100

## 2023-10-10 MED ORDER — DIPHENOXYLATE-ATROPINE 2.5-0.025 MG PO TABS: 1.0000 | ORAL_TABLET | Freq: Four times a day (QID) | ORAL | 1 refills | Status: DC | PRN

## 2023-10-10 MED ORDER — DEXAMETHASONE SODIUM PHOSPHATE 10 MG/ML IJ SOLN
10.0000 mg | Freq: Once | INTRAMUSCULAR | Status: AC
  Administered 2023-10-10: 10 mg via INTRAVENOUS
  Filled 2023-10-10: qty 1

## 2023-10-10 MED ORDER — SODIUM CHLORIDE 0.9 % IV SOLN
50.0000 mg/m2 | Freq: Once | INTRAVENOUS | Status: AC
  Administered 2023-10-10: 106 mg via INTRAVENOUS
  Filled 2023-10-10: qty 10.6

## 2023-10-10 NOTE — Progress Notes (Signed)
 Patient Care Team: [name], Vyvyan, MD as PCP - General (Family Medicine) Jon [place]wood, MD as Consulting Physician (Hematology and Oncology) Jon Cellar, RN as Oncology Nurse Navigator  Clinic Day:  10/10/2023  Referring physician: Sonja Onaway, MD  ASSESSMENT & PLAN:   Assessment & Plan: Gastric cancer (HCC) -cT2N0M0, stage I, MMR normal  - He presented with heartburn.  EGD showed medium size infiltrative, fungating and frond-like villous, partially circumferential mass in the cardia, biopsy showed at least intramucosal adenocarcinoma. -He underwent EUS on August 28, 2023, which showed T2 lesion, with possible tumor invading GE junction. -He had exploratory laparoscope on August 29, 2023, which was negative for peritoneal metastasis, he had a port placement. -Neoadjuvant chemotherapy FLOT every 2 weeks for 4 cycles, followed by surgery. He started on 09/12/2023 -Cycle 2 held on 09/27/2023 due to diarrhea and hypokalemia. -He has met general surgeon Dr. Leighton Velasquez, and is also scheduled to see thoracic surgeon Dr. Deloise Velasquez.  -10/10/2023 - Cycle 2 day 1 neoadjuvant FLOT today. Scheduled for additional IV fluids 10/11/2023 during appt. For pump d/c.     Nausea, vomiting, and diarrhea Side effects from chemotherapy. 2nd cycle chemo delayed until today due to severe side effects. He reports feeling better today. He did have 2 days of IV fluids and zofran  to help manage side effects. Will schedule IV fluids for him the day of pump d/c and again, if needed, due to side effects. Discussed being proactive with managing nausea and vomiting, taking nausea medications as soon as nausea develops rather than waiting for them to become severe. Will send ODT zofran  to his pharmacy to see if this is tolerated better and more effective than regular tablet. Will also send lomotil to better manage diarrhea.   Plan: Labs reviewed.  -mild and stable anemia. -mild and improved leukocytosis.  -CMP unremarkable.  -CEA  pending Labs and patient condition are satisfactory for treatment today. Proceed with Cycle 2 day 1 neoadjuvant FLOT Fluids to be scheduled on day of pump d/c. Antiemetics to be administered as indicated.  Labs/flush, follow up, and treatment in 2 weeks as scheduled.    The patient understands the plans discussed today and is in agreement with them.  He knows to contact our office if he develops concerns prior to his next appointment.  I provided 25 minutes of face-to-face time during this encounter and > 50% was spent counseling as documented under my assessment and plan.    Jon Deis, NP  [facility] - A DEPT OF [facility] [address] [place] [address] Dept: [phone] Dept Fax: [phone]   No orders of the defined types were placed in this encounter.     CHIEF COMPLAINT:  CC: Malignant neoplasm of cardia of stomach  Current Treatment:  neoadjuvant chemotherapy FLOT   INTERVAL HISTORY:  [name] is here today for repeat clinical assessment. Started neoadjuvant chemotherapy FLOT on 09/12/2023.  Moderate diarrhea along with hypokalemia which was treated with IV fluids and potassium supplementation.  Cycle 2 FLOT held initially. Will proceed with Cycle 2 neoadjuvant FLOT today. Patient reports feeling well. Appetite is poor, but was poor prior to diagnosis. He is maintaining his weight. He denies chest pain, chest pressure, or shortness of breath. He denies headaches or visual disturbances. He denies abdominal pain, nausea, vomiting, or changes in bowel or bladder habits.  He denies fevers or chills. He denies pain.   I have  reviewed the past medical history, past surgical history, social history and family history with the patient and they are unchanged from previous note.  ALLERGIES:  is allergic to dilaudid [hydromorphone].  MEDICATIONS:  Current Outpatient Medications  Medication Sig Dispense  Refill   albuterol  (VENTOLIN  HFA) 108 (90 Base) MCG/ACT inhaler Inhale 1-2 puffs into the lungs every 6 (six) hours as needed for shortness of breath or wheezing.     amLODipine (NORVASC) 5 MG tablet Take 5 mg by mouth daily.     Aspirin-Acetaminophen -Caffeine (GOODY HEADACHE PO) Take 1 packet by mouth daily as needed (Headache).     BELBUCA 750 MCG FILM Take 900 mcg by mouth in the morning and at bedtime.     buPROPion (WELLBUTRIN XL) 150 MG 24 hr tablet Take 150 mg by mouth every morning.     dexamethasone  (DECADRON ) 4 MG tablet Take 2 tablets (8 mg total) by mouth daily. Start the day after chemotherapy for 2 days. Take with food. 30 tablet 1   diphenoxylate -atropine  (LOMOTIL ) 2.5-0.025 MG tablet Take 1 tablet by mouth 4 (four) times daily as needed for diarrhea or loose stools. 30 tablet 1   hydrochlorothiazide (HYDRODIURIL) 25 MG tablet Take 25 mg by mouth in the morning.     lidocaine -prilocaine  (EMLA ) cream Apply to affected area once 30 g 3   LINZESS 145 MCG CAPS capsule Take 145 mcg by mouth at bedtime.     loperamide  (IMODIUM ) 2 MG capsule Take 2 mg by mouth as needed for diarrhea or loose stools.     losartan (COZAAR) 100 MG tablet Take 100 mg by mouth daily with supper.     metFORMIN (GLUCOPHAGE-XR) 500 MG 24 hr tablet Take 500 mg by mouth every evening.     methocarbamol (ROBAXIN) 750 MG tablet Take 750 mg by mouth at bedtime.     metoprolol (LOPRESSOR) 100 MG tablet Take 100 mg by mouth daily.     Multiple Vitamins-Minerals (MULTIVITAMIN MEN 50+) TABS as directed Orally     naloxone (NARCAN) nasal spray 4 mg/0.1 mL Place 1 spray into the nose as needed (opioid overdose).     ondansetron  (ZOFRAN ) 8 MG tablet Take 1 tablet (8 mg total) by mouth every 8 (eight) hours as needed for nausea or vomiting. Start on the third day after chemotherapy. 30 tablet 1   ondansetron  (ZOFRAN -ODT) 8 MG disintegrating tablet Take 1 tablet (8 mg total) by mouth every 8 (eight) hours as needed for nausea  or vomiting. 30 tablet 1   oxyCODONE -acetaminophen  (PERCOCET) 10-325 MG tablet Take 1 tablet by mouth every 4 (four) hours as needed for pain.     pantoprazole  (PROTONIX ) 40 MG tablet Take 1 tablet (40 mg total) by mouth every morning. 1/2 to 1 hour prior to meal 60 tablet 1   pregabalin (LYRICA) 25 MG capsule Take 25 mg by mouth at bedtime.     prochlorperazine  (COMPAZINE ) 10 MG tablet Take 1 tablet (10 mg total) by mouth every 6 (six) hours as needed for nausea or vomiting. 30 tablet 1   rosuvastatin (CRESTOR) 20 MG tablet Take 20 mg by mouth daily.     Vitamin D, Ergocalciferol, (DRISDOL) 1.25 MG (50000 UNIT) CAPS capsule Take 50,000 Units by mouth every 7 (seven) days.     potassium chloride  SA (KLOR-CON  M) 20 MEQ tablet Take 1 tablet (20 mEq total) by mouth 2 (two) times daily for 7 days. 14 tablet 0   No current facility-administered medications for this visit.  Facility-Administered Medications Ordered in Other Visits  Medication Dose Route Frequency Provider Last Rate Last Admin   dextrose  5 % solution   Intravenous Continuous Jon Pace, MD 10 mL/hr at 10/10/23 0948 New Bag at 10/10/23 0948   DOCEtaxel  (TAXOTERE ) 106 mg in sodium chloride  0.9 % 250 mL chemo infusion  50 mg/m2 (Treatment Plan Recorded) Intravenous Once Jon [place], MD       fluorouracil  (ADRUCIL ) 5,000 mg in sodium chloride  0.9 % 150 mL chemo infusion  2,600 mg/m2 (Treatment Plan Recorded) Intravenous 1 day or 1 dose Jon [place], MD       leucovorin  422 mg in dextrose  5 % 250 mL infusion  200 mg/m2 (Treatment Plan Recorded) Intravenous Once Jon [place], MD       oxaliplatin  (ELOXATIN ) 180 mg in dextrose  5 % 500 mL chemo infusion  85 mg/m2 (Treatment Plan Recorded) Intravenous Once Jon [place], MD        HISTORY OF PRESENT ILLNESS:   Oncology History  Gastric cancer (HCC)  08/20/2023 Initial Diagnosis   Gastric cancer (HCC)   08/29/2023 Cancer Staging   Staging form: Stomach, AJCC 8th Edition - Clinical stage from 08/29/2023:  Stage I (cT2, cN0, cM0) - Signed by Jon [place], MD on 09/01/2023 Total positive nodes: 0   09/12/2023 -  Chemotherapy   Patient is on Treatment Plan : GASTROESOPHAGEAL FLOT q14d X 4 cycles         REVIEW OF SYSTEMS:   Constitutional: Denies fevers, chills or abnormal weight loss Eyes: Denies blurriness of vision Ears, nose, mouth, throat, and face: Denies mucositis or sore throat Respiratory: Denies cough, dyspnea or wheezes Cardiovascular: Denies palpitation, chest discomfort or lower extremity swelling Gastrointestinal: Had significant nausea, vomiting, diarrhea for several days postchemotherapy.  Feeling better today.  Appetite at baseline. Skin: Denies abnormal skin rashes Lymphatics: Denies new lymphadenopathy or easy bruising Neurological:Denies numbness, tingling or new weaknesses Behavioral/Psych: Mood is stable, no new changes  All other systems were reviewed with the patient and are negative.   VITALS:   Today's Vitals   10/10/23 0859 10/10/23 0916  BP: 138/80   Pulse: 65   Resp: 17   Temp: 97.9 F (36.6 C)   SpO2: 99%   Weight: 191 lb 1.6 oz (86.7 kg)   PainSc:  5    Body mass index is 26.65 kg/m.   Wt Readings from Last 3 Encounters:  10/10/23 191 lb 1.6 oz (86.7 kg)  09/26/23 188 lb 1.6 oz (85.3 kg)  09/26/23 188 lb (85.3 kg)    Body mass index is 26.65 kg/m.  Performance status (ECOG): 1 - Symptomatic but completely ambulatory  PHYSICAL EXAM:   GENERAL:alert, no distress and comfortable SKIN: skin color, texture, turgor are normal, no rashes or significant lesions EYES: normal, Conjunctiva are pink and non-injected, sclera clear OROPHARYNX:no exudate, no erythema and lips, buccal mucosa, and tongue normal  NECK: supple, thyroid  normal size, non-tender, without nodularity LYMPH:  no palpable lymphadenopathy in the cervical, axillary or inguinal LUNGS: clear to auscultation and percussion with normal breathing effort HEART: regular rate & rhythm and  no murmurs and no lower extremity edema ABDOMEN:abdomen soft, non-tender and normal bowel sounds Musculoskeletal:no cyanosis of digits and no clubbing  NEURO: alert & oriented x 3 with fluent speech, no focal motor/sensory deficits  LABORATORY DATA:  I have reviewed the data as listed    Component Value Date/Time   NA 138 10/10/2023 0824   K 4.2 10/10/2023 0824   CL 104  10/10/2023 0824   CO2 30 10/10/2023 0824   GLUCOSE 150 (H) 10/10/2023 0824   BUN 11 10/10/2023 0824   CREATININE 0.85 10/10/2023 0824   CALCIUM  9.0 10/10/2023 0824   PROT 5.9 (L) 10/10/2023 0824   ALBUMIN 3.3 (L) 10/10/2023 0824   AST 15 10/10/2023 0824   ALT 11 10/10/2023 0824   ALKPHOS 33 (L) 10/10/2023 0824   BILITOT 0.5 10/10/2023 0824   GFRNONAA >60 10/10/2023 0824   GFRAA  10/16/2007 1246    >60        The eGFR has been calculated using the MDRD equation. This calculation has not been validated in all clinical   Lab Results  Component Value Date   WBC 12.4 (H) 10/10/2023   NEUTROABS 8.8 (H) 10/10/2023   HGB 12.3 (L) 10/10/2023   HCT 35.3 (L) 10/10/2023   MCV 95.7 10/10/2023   PLT 299 10/10/2023

## 2023-10-10 NOTE — Progress Notes (Signed)
 Nutrition follow-up completed with patient during infusion for gastroesophageal cancer.  Patient is receiving FLOT.  He is being followed by Dr. Maryalice Smaller.  Weight: 191 pounds 1.6 ounces May 29. Weight: 188 pounds 1.6 ounces May 15 Weight: 195 pounds 11.2 ounces May 2  Labs include glucose 150 and albumin 3.3.  Patient reports he had nausea and vomiting for about 1 week after his last chemotherapy.  Reports this is all resolved.  He is concerned he may experience the same nutrition impact symptoms with this treatment.  He states he has been educated by nursing to begin taking antiemetics sooner.  He tried [employer] essentials but did not like it.  States he drank 2 cartons of a nutrition drink this morning but cannot remember which one it was.  He also had cheese and crackers.  His daughter is with him today and very supportive.  No questions voiced.  Nutrition diagnosis: Food and nutrition related knowledge deficit, ongoing.  Intervention: Encourage patient to consume small frequent meals and snacks. Recommend continue oral nutrition supplements as tolerated.  Goal for weight maintenance. Agree with antiemetics on a schedule. Offered oral nutrition supplements however patient declined today.  Monitoring, evaluation, goals: Patient will tolerate adequate calories and protein for weight maintenance.  Next visit: Scheduled for June 12 during infusion with Ottie Blonder.  **Disclaimer: This note was dictated with voice recognition software. Similar sounding words can inadvertently be transcribed and this note may contain transcription errors which may not have been corrected upon publication of note.**

## 2023-10-10 NOTE — Assessment & Plan Note (Addendum)
-  cT2N0M0, stage I, MMR normal  - He presented with heartburn.  EGD showed medium size infiltrative, fungating and frond-like villous, partially circumferential mass in the cardia, biopsy showed at least intramucosal adenocarcinoma. -He underwent EUS on August 28, 2023, which showed T2 lesion, with possible tumor invading GE junction. -He had exploratory laparoscope on August 29, 2023, which was negative for peritoneal metastasis, he had a port placement. -Neoadjuvant chemotherapy FLOT every 2 weeks for 4 cycles, followed by surgery. He started on 09/12/2023 -Cycle 2 held on 09/27/2023 due to diarrhea and hypokalemia. -He has met general surgeon Dr. Leighton Punches, and is also scheduled to see thoracic surgeon Dr. Deloise Ferries.  -10/10/2023 - Cycle 2 day 1 neoadjuvant FLOT today. Scheduled for additional IV fluids 10/11/2023 during appt. For pump d/c.

## 2023-10-10 NOTE — Progress Notes (Signed)
 [facility] CSW Progress Note  Clinical Social Worker checked in on pt during infusion.  Pt states he is appreciative of the system set up for him to manage his nausea medications and believes it is working well at this time.  Overall pt states he is doing well.  Pt's schedule changed on 5/30 to arrive at 10:00 am for fluids.  Pt requested CSW contact pt's sister to inform of the change which was done on behalf of pt.  CSW to remain available to provide support as appropriate throughout duration of treatment.       Jon Bruns, LCSW Clinical Social Worker [facility]

## 2023-10-10 NOTE — Patient Instructions (Signed)
 [facility] - A DEPT OF [facility]  Discharge Instructions: Thank you for choosing [facility] to provide your oncology and hematology care.   If you have a lab appointment with the Cancer Center, please go directly to the Cancer Center and check in at the registration area.   Wear comfortable clothing and clothing appropriate for easy access to any Portacath or PICC line.   We strive to give you quality time with your provider. You may need to reschedule your appointment if you arrive late (15 or more minutes).  Arriving late affects you and other patients whose appointments are after yours.  Also, if you miss three or more appointments without notifying the office, you may be dismissed from the clinic at the provider's discretion.      For prescription refill requests, have your pharmacy contact our office and allow 72 hours for refills to be completed.    Today you received the following chemotherapy and/or immunotherapy agents: docetaxel , oxaliplatin , leucovorin , fluorouracil       To help prevent nausea and vomiting after your treatment, we encourage you to take your nausea medication as directed.  BELOW ARE SYMPTOMS THAT SHOULD BE REPORTED IMMEDIATELY: *FEVER GREATER THAN 100.4 F (38 C) OR HIGHER *CHILLS OR SWEATING *NAUSEA AND VOMITING THAT IS NOT CONTROLLED WITH YOUR NAUSEA MEDICATION *UNUSUAL SHORTNESS OF BREATH *UNUSUAL BRUISING OR BLEEDING *URINARY PROBLEMS (pain or burning when urinating, or frequent urination) *BOWEL PROBLEMS (unusual diarrhea, constipation, pain near the anus) TENDERNESS IN MOUTH AND THROAT WITH OR WITHOUT PRESENCE OF ULCERS (sore throat, sores in mouth, or a toothache) UNUSUAL RASH, SWELLING OR PAIN  UNUSUAL VAGINAL DISCHARGE OR ITCHING   Items with * indicate a potential emergency and should be followed up as soon as possible or go to the Emergency Department if any problems should occur.  Please show the  CHEMOTHERAPY ALERT CARD or IMMUNOTHERAPY ALERT CARD at check-in to the Emergency Department and triage nurse.  Should you have questions after your visit or need to cancel or reschedule your appointment, please contact [facility] - A DEPT OF [facility]  Dept: [phone]  and follow the prompts.  Office hours are 8:00 a.m. to 4:30 p.m. Monday - Friday. Please note that voicemails left after 4:00 p.m. may not be returned until the following business day.  We are closed weekends and major holidays. You have access to a nurse at all times for urgent questions. Please call the main number to the clinic Dept: [phone] and follow the prompts.   For any non-urgent questions, you may also contact your provider using MyChart. We now offer e-Visits for anyone 25 and older to request care online for non-urgent symptoms. For details visit [email or web address].   Also download the MyChart app! Go to the app store, search "MyChart", open the app, select [place], and log in with your MyChart username and password.

## 2023-10-11 ENCOUNTER — Inpatient Hospital Stay

## 2023-10-11 ENCOUNTER — Other Ambulatory Visit: Payer: Self-pay | Admitting: Nurse Practitioner

## 2023-10-11 VITALS — BP 143/84 | HR 16 | Temp 97.5°F | Resp 16 | Wt 189.8 lb

## 2023-10-11 DIAGNOSIS — Z5111 Encounter for antineoplastic chemotherapy: Secondary | ICD-10-CM | POA: Diagnosis not present

## 2023-10-11 DIAGNOSIS — R197 Diarrhea, unspecified: Secondary | ICD-10-CM

## 2023-10-11 DIAGNOSIS — C160 Malignant neoplasm of cardia: Secondary | ICD-10-CM

## 2023-10-11 MED ORDER — LOPERAMIDE HCL 2 MG PO CAPS
4.0000 mg | ORAL_CAPSULE | Freq: Once | ORAL | Status: AC
  Administered 2023-10-11: 4 mg via ORAL
  Filled 2023-10-11: qty 2

## 2023-10-11 MED ORDER — PEGFILGRASTIM-JMDB 6 MG/0.6ML ~~LOC~~ SOSY
6.0000 mg | PREFILLED_SYRINGE | Freq: Once | SUBCUTANEOUS | Status: AC
  Administered 2023-10-11: 6 mg via SUBCUTANEOUS
  Filled 2023-10-11: qty 0.6

## 2023-10-11 MED ORDER — SODIUM CHLORIDE 0.9 % IV SOLN: INTRAVENOUS | Status: DC

## 2023-10-12 ENCOUNTER — Other Ambulatory Visit: Payer: Self-pay

## 2023-10-12 ENCOUNTER — Ambulatory Visit

## 2023-10-18 ENCOUNTER — Ambulatory Visit: Admitting: Thoracic Surgery (Cardiothoracic Vascular Surgery)

## 2023-10-19 ENCOUNTER — Other Ambulatory Visit: Payer: Self-pay | Admitting: Physician Assistant

## 2023-10-20 ENCOUNTER — Other Ambulatory Visit: Payer: Self-pay

## 2023-10-21 ENCOUNTER — Other Ambulatory Visit (HOSPITAL_COMMUNITY): Payer: Self-pay

## 2023-10-21 MED ORDER — POTASSIUM CHLORIDE CRYS ER 20 MEQ PO TBCR
20.0000 meq | EXTENDED_RELEASE_TABLET | Freq: Every day | ORAL | 1 refills | Status: DC
  Filled 2023-10-21: qty 30, 30d supply, fill #0
  Filled 2023-11-25: qty 30, 30d supply, fill #1

## 2023-10-22 ENCOUNTER — Encounter: Payer: Self-pay | Admitting: Hematology

## 2023-10-24 ENCOUNTER — Inpatient Hospital Stay

## 2023-10-24 ENCOUNTER — Inpatient Hospital Stay: Admitting: Physician Assistant

## 2023-10-24 ENCOUNTER — Other Ambulatory Visit: Payer: Self-pay

## 2023-10-24 ENCOUNTER — Inpatient Hospital Stay: Attending: Hematology

## 2023-10-24 ENCOUNTER — Other Ambulatory Visit (HOSPITAL_COMMUNITY): Payer: Self-pay

## 2023-10-24 ENCOUNTER — Inpatient Hospital Stay: Admitting: Dietician

## 2023-10-24 VITALS — BP 129/86 | HR 64 | Temp 98.2°F | Resp 18 | Ht 71.0 in | Wt 186.1 lb

## 2023-10-24 DIAGNOSIS — F17210 Nicotine dependence, cigarettes, uncomplicated: Secondary | ICD-10-CM | POA: Insufficient documentation

## 2023-10-24 DIAGNOSIS — Z5111 Encounter for antineoplastic chemotherapy: Secondary | ICD-10-CM | POA: Insufficient documentation

## 2023-10-24 DIAGNOSIS — D72829 Elevated white blood cell count, unspecified: Secondary | ICD-10-CM | POA: Insufficient documentation

## 2023-10-24 DIAGNOSIS — R197 Diarrhea, unspecified: Secondary | ICD-10-CM | POA: Diagnosis not present

## 2023-10-24 DIAGNOSIS — G8929 Other chronic pain: Secondary | ICD-10-CM | POA: Insufficient documentation

## 2023-10-24 DIAGNOSIS — Z8042 Family history of malignant neoplasm of prostate: Secondary | ICD-10-CM | POA: Diagnosis not present

## 2023-10-24 DIAGNOSIS — C160 Malignant neoplasm of cardia: Secondary | ICD-10-CM | POA: Insufficient documentation

## 2023-10-24 DIAGNOSIS — Z452 Encounter for adjustment and management of vascular access device: Secondary | ICD-10-CM | POA: Insufficient documentation

## 2023-10-24 DIAGNOSIS — Z809 Family history of malignant neoplasm, unspecified: Secondary | ICD-10-CM | POA: Insufficient documentation

## 2023-10-24 DIAGNOSIS — Z79891 Long term (current) use of opiate analgesic: Secondary | ICD-10-CM | POA: Insufficient documentation

## 2023-10-24 DIAGNOSIS — Z95828 Presence of other vascular implants and grafts: Secondary | ICD-10-CM

## 2023-10-24 LAB — COMPREHENSIVE METABOLIC PANEL WITH GFR
ALT: 10 U/L (ref 0–44)
AST: 14 U/L — ABNORMAL LOW (ref 15–41)
Albumin: 3 g/dL — ABNORMAL LOW (ref 3.5–5.0)
Alkaline Phosphatase: 43 U/L (ref 38–126)
Anion gap: 10 (ref 5–15)
BUN: 16 mg/dL (ref 8–23)
CO2: 27 mmol/L (ref 22–32)
Calcium: 8.8 mg/dL — ABNORMAL LOW (ref 8.9–10.3)
Chloride: 100 mmol/L (ref 98–111)
Creatinine, Ser: 0.95 mg/dL (ref 0.61–1.24)
GFR, Estimated: >60 mL/min (ref >60)
Glucose, Bld: 123 mg/dL — ABNORMAL HIGH (ref 70–99)
Potassium: 3.5 mmol/L (ref 3.5–5.1)
Sodium: 137 mmol/L (ref 135–145)
Total Bilirubin: 0.7 mg/dL (ref 0.0–1.2)
Total Protein: 5.9 g/dL — ABNORMAL LOW (ref 6.5–8.1)

## 2023-10-24 LAB — CBC WITH DIFFERENTIAL/PLATELET
Abs Immature Granulocytes: 0.29 10*3/uL — ABNORMAL HIGH (ref 0.00–0.07)
Basophils Absolute: 0.1 10*3/uL (ref 0.0–0.1)
Basophils Relative: 0 %
Eosinophils Absolute: 0.2 10*3/uL (ref 0.0–0.5)
Eosinophils Relative: 1 %
HCT: 34.4 % — ABNORMAL LOW (ref 39.0–52.0)
Hemoglobin: 12.1 g/dL — ABNORMAL LOW (ref 13.0–17.0)
Immature Granulocytes: 2 %
Lymphocytes Relative: 10 %
Lymphs Abs: 1.7 10*3/uL (ref 0.7–4.0)
MCH: 33.2 pg (ref 26.0–34.0)
MCHC: 35.2 g/dL (ref 30.0–36.0)
MCV: 94.5 fL (ref 80.0–100.0)
Monocytes Absolute: 1.4 10*3/uL — ABNORMAL HIGH (ref 0.1–1.0)
Monocytes Relative: 8 %
Neutro Abs: 13.7 10*3/uL — ABNORMAL HIGH (ref 1.7–7.7)
Neutrophils Relative %: 79 %
Platelets: 218 10*3/uL (ref 150–400)
RBC: 3.64 MIL/uL — ABNORMAL LOW (ref 4.22–5.81)
RDW: 14.8 % (ref 11.5–15.5)
WBC: 17.4 10*3/uL — ABNORMAL HIGH (ref 4.0–10.5)
nRBC: 0 % (ref 0.0–0.2)

## 2023-10-24 MED ORDER — OXALIPLATIN CHEMO INJECTION 100 MG/20ML
85.0000 mg/m2 | Freq: Once | INTRAVENOUS | Status: AC
  Administered 2023-10-24: 180 mg via INTRAVENOUS
  Filled 2023-10-24: qty 36

## 2023-10-24 MED ORDER — SODIUM CHLORIDE 0.9 % IV SOLN
2600.0000 mg/m2 | INTRAVENOUS | Status: DC
  Administered 2023-10-24: 5000 mg via INTRAVENOUS
  Filled 2023-10-24: qty 100

## 2023-10-24 MED ORDER — LEUCOVORIN CALCIUM INJECTION 350 MG
200.0000 mg/m2 | Freq: Once | INTRAVENOUS | Status: AC
  Administered 2023-10-24: 422 mg via INTRAVENOUS
  Filled 2023-10-24: qty 21.1

## 2023-10-24 MED ORDER — DEXTROSE 5 % IV SOLN: INTRAVENOUS | Status: DC

## 2023-10-24 MED ORDER — PALONOSETRON HCL INJECTION 0.25 MG/5ML
0.2500 mg | Freq: Once | INTRAVENOUS | Status: AC
  Administered 2023-10-24: 0.25 mg via INTRAVENOUS
  Filled 2023-10-24: qty 5

## 2023-10-24 MED ORDER — SODIUM CHLORIDE 0.9 % IV SOLN
50.0000 mg/m2 | Freq: Once | INTRAVENOUS | Status: AC
  Administered 2023-10-24: 106 mg via INTRAVENOUS
  Filled 2023-10-24: qty 10.6

## 2023-10-24 MED ORDER — DEXAMETHASONE SODIUM PHOSPHATE 10 MG/ML IJ SOLN
10.0000 mg | Freq: Once | INTRAMUSCULAR | Status: AC
  Administered 2023-10-24: 10 mg via INTRAVENOUS
  Filled 2023-10-24: qty 1

## 2023-10-24 MED ORDER — SODIUM CHLORIDE 0.9% FLUSH: 10.0000 mL | INTRAVENOUS | Status: DC | PRN

## 2023-10-24 NOTE — Progress Notes (Signed)
 Nutrition Follow-up:  Patient with gastric cancer. He is receiving neoadjuvant FLOT. Patient followed by Dr. Maryalice Smaller.   Met with patient in infusion. He is so-so today. Reports mild cold sensitivity lasting 3-4 days. He had 7 days of diarrhea following last cycle. He has imodium  which has worked well. Pt took a proactive dose today. He reports last bowel movement was 8 days ago. Pt states sometimes his bowels don't move for a month which is normal for him. Patient reports zofran -odt works well for nausea as long as he remembers to take it. Patient does not have much of an appetite. Eating one meal. Patient unable to recall what he had yesterday or within the last week. He usually looks in the trash can to see what he had the day prior. Patient reports 1-2 Ensure Complete. Patient reports he has a medication ready at [facility], however transportation informed him they are unable to take him to get this. Patient lives in [place]. He has no transportation. Per Runell Countryman, pt would need to take [facility] shuttle to pharmacy and back. Transportation would then be called to take him home. RD picked klor-con  up for patient.   Medications: reviewed   Labs: glucose 123, albumin  Anthropometrics: Wt 186 lb 1.6 oz decreased ~3% in 2 weeks - this is severe    NUTRITION DIAGNOSIS: Food and nutrition related knowledge deficit continues    INTERVENTION:  Educated on strategies for diarrhea, foods best tolerated, encourage small meals/snacks frequently vs one large meal Instructed pt to hold additional doses of antidiarrheal until good BM given no BM x 8 days Take antiemetics as prescribed - suggested setting alarms on phone as reminder Continue Ensure Complete - recommend increasing 3/day - samples + coupons    MONITORING, EVALUATION, GOAL: wt trends, intake   NEXT VISIT: Thursday June 26 during infusion
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
## 2023-10-24 NOTE — Patient Instructions (Addendum)
 [facility] - A DEPT OF [facility]  Discharge Instructions: Thank you for choosing [facility] to provide your oncology and hematology care.   If you have a lab appointment with the Cancer Center, please go directly to the Cancer Center and check in at the registration area.   Wear comfortable clothing and clothing appropriate for easy access to any Portacath or PICC line.   We strive to give you quality time with your provider. You may need to reschedule your appointment if you arrive late (15 or more minutes).  Arriving late affects you and other patients whose appointments are after yours.  Also, if you miss three or more appointments without notifying the office, you may be dismissed from the clinic at the provider's discretion.      For prescription refill requests, have your pharmacy contact our office and allow 72 hours for refills to be completed.    Today you received the following chemotherapy and/or immunotherapy agents: Docetaxel/Oxaliplatin/Leucovorin/Fluorouracil       To help prevent nausea and vomiting after your treatment, we encourage you to take your nausea medication as directed.  BELOW ARE SYMPTOMS THAT SHOULD BE REPORTED IMMEDIATELY: *FEVER GREATER THAN 100.4 F (38 C) OR HIGHER *CHILLS OR SWEATING *NAUSEA AND VOMITING THAT IS NOT CONTROLLED WITH YOUR NAUSEA MEDICATION *UNUSUAL SHORTNESS OF BREATH *UNUSUAL BRUISING OR BLEEDING *URINARY PROBLEMS (pain or burning when urinating, or frequent urination) *BOWEL PROBLEMS (unusual diarrhea, constipation, pain near the anus) TENDERNESS IN MOUTH AND THROAT WITH OR WITHOUT PRESENCE OF ULCERS (sore throat, sores in mouth, or a toothache) UNUSUAL RASH, SWELLING OR PAIN  UNUSUAL VAGINAL DISCHARGE OR ITCHING   Items with * indicate a potential emergency and should be followed up as soon as possible or go to the Emergency Department if any problems should occur.  Please show the  CHEMOTHERAPY ALERT CARD or IMMUNOTHERAPY ALERT CARD at check-in to the Emergency Department and triage nurse.  Should you have questions after your visit or need to cancel or reschedule your appointment, please contact [facility] - A DEPT OF [facility]  Dept: [phone]  and follow the prompts.  Office hours are 8:00 a.m. to 4:30 p.m. Monday - Friday. Please note that voicemails left after 4:00 p.m. may not be returned until the following business day.  We are closed weekends and major holidays. You have access to a nurse at all times for urgent questions. Please call the main number to the clinic Dept: [phone] and follow the prompts.   For any non-urgent questions, you may also contact your provider using MyChart. We now offer e-Visits for anyone 59 and older to request care online for non-urgent symptoms. For details visit [email or web address].   Also download the MyChart app! Go to the app store, search "MyChart", open the app, select [place], and log in with your MyChart username and password.

## 2023-10-25 ENCOUNTER — Encounter: Payer: Self-pay | Admitting: Hematology

## 2023-10-25 ENCOUNTER — Inpatient Hospital Stay

## 2023-10-25 ENCOUNTER — Encounter

## 2023-10-25 VITALS — BP 138/78 | HR 72 | Temp 98.3°F | Resp 16

## 2023-10-25 DIAGNOSIS — C160 Malignant neoplasm of cardia: Secondary | ICD-10-CM

## 2023-10-25 DIAGNOSIS — Z5111 Encounter for antineoplastic chemotherapy: Secondary | ICD-10-CM | POA: Diagnosis not present

## 2023-10-25 MED ORDER — SODIUM CHLORIDE 0.9 % IV SOLN: Freq: Once | INTRAVENOUS | Status: AC

## 2023-10-25 MED ORDER — SODIUM CHLORIDE 0.9% FLUSH
3.0000 mL | INTRAVENOUS | Status: DC | PRN
  Administered 2023-10-25: 3 mL

## 2023-10-25 MED ORDER — HEPARIN SOD (PORK) LOCK FLUSH 100 UNIT/ML IV SOLN
250.0000 [IU] | Freq: Once | INTRAVENOUS | Status: AC | PRN
  Administered 2023-10-25: 250 [IU]

## 2023-10-25 NOTE — Patient Instructions (Signed)
 [facility] - A DEPT OF [facility]  Discharge Instructions: Thank you for choosing [facility] to provide your oncology and hematology care.   If you have a lab appointment with the Cancer Center, please go directly to the Cancer Center and check in at the registration area.   Wear comfortable clothing and clothing appropriate for easy access to any Portacath or PICC line.   We strive to give you quality time with your provider. You may need to reschedule your appointment if you arrive late (15 or more minutes).  Arriving late affects you and other patients whose appointments are after yours.  Also, if you miss three or more appointments without notifying the office, you may be dismissed from the clinic at the provider's discretion.      For prescription refill requests, have your pharmacy contact our office and allow 72 hours for refills to be completed.    Today you received the following chemotherapy and/or immunotherapy agents: Fluorouracil .      To help prevent nausea and vomiting after your treatment, we encourage you to take your nausea medication as directed.  BELOW ARE SYMPTOMS THAT SHOULD BE REPORTED IMMEDIATELY: *FEVER GREATER THAN 100.4 F (38 C) OR HIGHER *CHILLS OR SWEATING *NAUSEA AND VOMITING THAT IS NOT CONTROLLED WITH YOUR NAUSEA MEDICATION *UNUSUAL SHORTNESS OF BREATH *UNUSUAL BRUISING OR BLEEDING *URINARY PROBLEMS (pain or burning when urinating, or frequent urination) *BOWEL PROBLEMS (unusual diarrhea, constipation, pain near the anus) TENDERNESS IN MOUTH AND THROAT WITH OR WITHOUT PRESENCE OF ULCERS (sore throat, sores in mouth, or a toothache) UNUSUAL RASH, SWELLING OR PAIN  UNUSUAL VAGINAL DISCHARGE OR ITCHING   Items with * indicate a potential emergency and should be followed up as soon as possible or go to the Emergency Department if any problems should occur.  Please show the CHEMOTHERAPY ALERT CARD or  IMMUNOTHERAPY ALERT CARD at check-in to the Emergency Department and triage nurse.  Should you have questions after your visit or need to cancel or reschedule your appointment, please contact [facility] - A DEPT OF [facility]  Dept: [phone]  and follow the prompts.  Office hours are 8:00 a.m. to 4:30 p.m. Monday - Friday. Please note that voicemails left after 4:00 p.m. may not be returned until the following business day.  We are closed weekends and major holidays. You have access to a nurse at all times for urgent questions. Please call the main number to the clinic Dept: [phone] and follow the prompts.   For any non-urgent questions, you may also contact your provider using MyChart. We now offer e-Visits for anyone 21 and older to request care online for non-urgent symptoms. For details visit [email or web address].   Also download the MyChart app! Go to the app store, search MyChart, open the app, select [facility], and log in with your MyChart username and password.

## 2023-10-26 ENCOUNTER — Ambulatory Visit

## 2023-11-02 ENCOUNTER — Emergency Department (HOSPITAL_COMMUNITY)

## 2023-11-02 ENCOUNTER — Inpatient Hospital Stay (HOSPITAL_COMMUNITY)
Admission: EM | Admit: 2023-11-02 | Discharge: 2023-11-11 | DRG: 388 | Disposition: A | Discharge status: Home Health | Source: Other Acute Inpatient Hospital | Attending: Internal Medicine | Admitting: Internal Medicine

## 2023-11-02 ENCOUNTER — Other Ambulatory Visit: Payer: Self-pay | Admitting: Internal Medicine

## 2023-11-02 ENCOUNTER — Inpatient Hospital Stay: Admit: 2023-11-02 | Admitting: Internal Medicine

## 2023-11-02 ENCOUNTER — Encounter (HOSPITAL_COMMUNITY): Payer: Self-pay

## 2023-11-02 ENCOUNTER — Inpatient Hospital Stay (HOSPITAL_COMMUNITY)

## 2023-11-02 DIAGNOSIS — F17210 Nicotine dependence, cigarettes, uncomplicated: Secondary | ICD-10-CM | POA: Diagnosis present

## 2023-11-02 DIAGNOSIS — E1122 Type 2 diabetes mellitus with diabetic chronic kidney disease: Secondary | ICD-10-CM | POA: Diagnosis present

## 2023-11-02 DIAGNOSIS — J9601 Acute respiratory failure with hypoxia: Secondary | ICD-10-CM | POA: Diagnosis present

## 2023-11-02 DIAGNOSIS — I10 Essential (primary) hypertension: Secondary | ICD-10-CM | POA: Diagnosis not present

## 2023-11-02 DIAGNOSIS — E1121 Type 2 diabetes mellitus with diabetic nephropathy: Secondary | ICD-10-CM | POA: Diagnosis present

## 2023-11-02 DIAGNOSIS — E860 Dehydration: Secondary | ICD-10-CM | POA: Diagnosis present

## 2023-11-02 DIAGNOSIS — A419 Sepsis, unspecified organism: Secondary | ICD-10-CM | POA: Diagnosis not present

## 2023-11-02 DIAGNOSIS — E872 Acidosis, unspecified: Secondary | ICD-10-CM | POA: Diagnosis present

## 2023-11-02 DIAGNOSIS — E871 Hypo-osmolality and hyponatremia: Secondary | ICD-10-CM | POA: Diagnosis present

## 2023-11-02 DIAGNOSIS — I480 Paroxysmal atrial fibrillation: Secondary | ICD-10-CM | POA: Diagnosis present

## 2023-11-02 DIAGNOSIS — E7800 Pure hypercholesterolemia, unspecified: Secondary | ICD-10-CM | POA: Diagnosis present

## 2023-11-02 DIAGNOSIS — I2510 Atherosclerotic heart disease of native coronary artery without angina pectoris: Secondary | ICD-10-CM | POA: Diagnosis present

## 2023-11-02 DIAGNOSIS — D630 Anemia in neoplastic disease: Secondary | ICD-10-CM | POA: Diagnosis present

## 2023-11-02 DIAGNOSIS — G894 Chronic pain syndrome: Secondary | ICD-10-CM | POA: Diagnosis present

## 2023-11-02 DIAGNOSIS — Z79891 Long term (current) use of opiate analgesic: Secondary | ICD-10-CM

## 2023-11-02 DIAGNOSIS — E876 Hypokalemia: Secondary | ICD-10-CM | POA: Diagnosis not present

## 2023-11-02 DIAGNOSIS — F325 Major depressive disorder, single episode, in full remission: Secondary | ICD-10-CM | POA: Diagnosis present

## 2023-11-02 DIAGNOSIS — K219 Gastro-esophageal reflux disease without esophagitis: Secondary | ICD-10-CM | POA: Diagnosis present

## 2023-11-02 DIAGNOSIS — I129 Hypertensive chronic kidney disease with stage 1 through stage 4 chronic kidney disease, or unspecified chronic kidney disease: Secondary | ICD-10-CM | POA: Diagnosis present

## 2023-11-02 DIAGNOSIS — K567 Ileus, unspecified: Principal | ICD-10-CM | POA: Diagnosis present

## 2023-11-02 DIAGNOSIS — C169 Malignant neoplasm of stomach, unspecified: Secondary | ICD-10-CM | POA: Diagnosis present

## 2023-11-02 DIAGNOSIS — Z7984 Long term (current) use of oral hypoglycemic drugs: Secondary | ICD-10-CM

## 2023-11-02 DIAGNOSIS — J439 Emphysema, unspecified: Secondary | ICD-10-CM | POA: Diagnosis present

## 2023-11-02 DIAGNOSIS — R5081 Fever presenting with conditions classified elsewhere: Secondary | ICD-10-CM | POA: Diagnosis present

## 2023-11-02 DIAGNOSIS — R6521 Severe sepsis with septic shock: Secondary | ICD-10-CM

## 2023-11-02 DIAGNOSIS — R571 Hypovolemic shock: Secondary | ICD-10-CM | POA: Diagnosis present

## 2023-11-02 DIAGNOSIS — D849 Immunodeficiency, unspecified: Secondary | ICD-10-CM | POA: Diagnosis present

## 2023-11-02 DIAGNOSIS — K56609 Unspecified intestinal obstruction, unspecified as to partial versus complete obstruction: Secondary | ICD-10-CM

## 2023-11-02 DIAGNOSIS — N189 Chronic kidney disease, unspecified: Secondary | ICD-10-CM | POA: Diagnosis present

## 2023-11-02 DIAGNOSIS — D701 Agranulocytosis secondary to cancer chemotherapy: Secondary | ICD-10-CM | POA: Diagnosis present

## 2023-11-02 DIAGNOSIS — I4891 Unspecified atrial fibrillation: Secondary | ICD-10-CM | POA: Diagnosis not present

## 2023-11-02 DIAGNOSIS — F1110 Opioid abuse, uncomplicated: Secondary | ICD-10-CM | POA: Diagnosis present

## 2023-11-02 DIAGNOSIS — Z7901 Long term (current) use of anticoagulants: Secondary | ICD-10-CM

## 2023-11-02 DIAGNOSIS — A401 Sepsis due to streptococcus, group B: Secondary | ICD-10-CM | POA: Diagnosis not present

## 2023-11-02 DIAGNOSIS — T451X5A Adverse effect of antineoplastic and immunosuppressive drugs, initial encounter: Secondary | ICD-10-CM | POA: Diagnosis present

## 2023-11-02 DIAGNOSIS — Z885 Allergy status to narcotic agent status: Secondary | ICD-10-CM

## 2023-11-02 DIAGNOSIS — N179 Acute kidney failure, unspecified: Secondary | ICD-10-CM | POA: Diagnosis present

## 2023-11-02 DIAGNOSIS — Z79899 Other long term (current) drug therapy: Secondary | ICD-10-CM

## 2023-11-02 DIAGNOSIS — R7989 Other specified abnormal findings of blood chemistry: Secondary | ICD-10-CM | POA: Diagnosis not present

## 2023-11-02 DIAGNOSIS — A084 Viral intestinal infection, unspecified: Secondary | ICD-10-CM | POA: Diagnosis present

## 2023-11-02 DIAGNOSIS — J9691 Respiratory failure, unspecified with hypoxia: Secondary | ICD-10-CM | POA: Insufficient documentation

## 2023-11-02 DIAGNOSIS — N3281 Overactive bladder: Secondary | ICD-10-CM | POA: Diagnosis present

## 2023-11-02 DIAGNOSIS — D709 Neutropenia, unspecified: Secondary | ICD-10-CM | POA: Diagnosis present

## 2023-11-02 DIAGNOSIS — J449 Chronic obstructive pulmonary disease, unspecified: Secondary | ICD-10-CM | POA: Diagnosis present

## 2023-11-02 HISTORY — DX: Hypovolemic shock: R57.1

## 2023-11-02 HISTORY — DX: Unspecified intestinal obstruction, unspecified as to partial versus complete obstruction: K56.609

## 2023-11-02 LAB — CBC WITH DIFFERENTIAL/PLATELET
Abs Immature Granulocytes: 0.02 10*3/uL (ref 0.00–0.07)
Basophils Absolute: 0 10*3/uL (ref 0.0–0.1)
Basophils Relative: 1 %
Eosinophils Absolute: 0 10*3/uL (ref 0.0–0.5)
Eosinophils Relative: 0 %
HCT: 32.6 % — ABNORMAL LOW (ref 39.0–52.0)
Hemoglobin: 11.7 g/dL — ABNORMAL LOW (ref 13.0–17.0)
Immature Granulocytes: 1 %
Lymphocytes Relative: 33 %
Lymphs Abs: 0.6 10*3/uL — ABNORMAL LOW (ref 0.7–4.0)
MCH: 33.3 pg (ref 26.0–34.0)
MCHC: 35.9 g/dL (ref 30.0–36.0)
MCV: 92.9 fL (ref 80.0–100.0)
Monocytes Absolute: 1 10*3/uL (ref 0.1–1.0)
Monocytes Relative: 57 %
Neutro Abs: 0.1 10*3/uL — CL (ref 1.7–7.7)
Neutrophils Relative %: 8 %
Platelets: 178 10*3/uL (ref 150–400)
RBC: 3.51 MIL/uL — ABNORMAL LOW (ref 4.22–5.81)
RDW: 14.4 % (ref 11.5–15.5)
Smear Review: NORMAL
WBC: 1.7 10*3/uL — ABNORMAL LOW (ref 4.0–10.5)
nRBC: 0 % (ref 0.0–0.2)

## 2023-11-02 LAB — COMPREHENSIVE METABOLIC PANEL WITH GFR
ALT: 13 U/L (ref 0–44)
AST: 26 U/L (ref 15–41)
Albumin: 2.5 g/dL — ABNORMAL LOW (ref 3.5–5.0)
Alkaline Phosphatase: 24 U/L — ABNORMAL LOW (ref 38–126)
Anion gap: 15 (ref 5–15)
BUN: 35 mg/dL — ABNORMAL HIGH (ref 8–23)
CO2: 19 mmol/L — ABNORMAL LOW (ref 22–32)
Calcium: 8.7 mg/dL — ABNORMAL LOW (ref 8.9–10.3)
Chloride: 95 mmol/L — ABNORMAL LOW (ref 98–111)
Creatinine, Ser: 2.14 mg/dL — ABNORMAL HIGH (ref 0.61–1.24)
GFR, Estimated: 33 mL/min — ABNORMAL LOW (ref >60)
Glucose, Bld: 164 mg/dL — ABNORMAL HIGH (ref 70–99)
Potassium: 3.6 mmol/L (ref 3.5–5.1)
Sodium: 129 mmol/L — ABNORMAL LOW (ref 135–145)
Total Bilirubin: 1.1 mg/dL (ref 0.0–1.2)
Total Protein: 5.9 g/dL — ABNORMAL LOW (ref 6.5–8.1)

## 2023-11-02 LAB — LACTIC ACID, PLASMA: Lactic Acid, Venous: 3.5 mmol/L (ref 0.5–1.9)

## 2023-11-02 MED ORDER — POLYETHYLENE GLYCOL 3350 17 G PO PACK: 17.0000 g | PACK | Freq: Every day | ORAL | Status: DC | PRN

## 2023-11-02 MED ORDER — LACTATED RINGERS IV BOLUS
1000.0000 mL | Freq: Once | INTRAVENOUS | Status: AC
  Administered 2023-11-02: 1000 mL via INTRAVENOUS

## 2023-11-02 MED ORDER — METRONIDAZOLE 500 MG/100ML IV SOLN: 500.0000 mg | Freq: Two times a day (BID) | INTRAVENOUS | Status: DC

## 2023-11-02 MED ORDER — FENTANYL CITRATE PF 50 MCG/ML IJ SOSY
50.0000 ug | PREFILLED_SYRINGE | Freq: Once | INTRAMUSCULAR | Status: AC
  Administered 2023-11-03: 50 ug via INTRAVENOUS
  Filled 2023-11-02: qty 1

## 2023-11-02 MED ORDER — VANCOMYCIN VARIABLE DOSE PER UNSTABLE RENAL FUNCTION (PHARMACIST DOSING): Status: DC

## 2023-11-02 MED ORDER — HEPARIN (PORCINE) 25000 UT/250ML-% IV SOLN
1300.0000 [IU]/h | INTRAVENOUS | Status: DC
  Administered 2023-11-02: 1400 [IU]/h via INTRAVENOUS
  Filled 2023-11-02: qty 250

## 2023-11-02 MED ORDER — DOCUSATE SODIUM 100 MG PO CAPS: 100.0000 mg | ORAL_CAPSULE | Freq: Two times a day (BID) | ORAL | Status: DC | PRN

## 2023-11-02 MED ORDER — SODIUM CHLORIDE 0.9 % IV SOLN
2.0000 g | Freq: Two times a day (BID) | INTRAVENOUS | Status: DC
  Administered 2023-11-03: 2 g via INTRAVENOUS
  Filled 2023-11-02: qty 12.5

## 2023-11-02 MED ORDER — LACTATED RINGERS IV BOLUS
500.0000 mL | Freq: Once | INTRAVENOUS | Status: AC
  Administered 2023-11-02: 500 mL via INTRAVENOUS

## 2023-11-02 MED ORDER — DIATRIZOATE MEGLUMINE & SODIUM 66-10 % PO SOLN
90.0000 mL | Freq: Once | ORAL | Status: AC
  Administered 2023-11-02: 90 mL via NASOGASTRIC
  Filled 2023-11-02 (×2): qty 90

## 2023-11-02 MED ORDER — NOREPINEPHRINE 4 MG/250ML-% IV SOLN
0.0000 ug/min | INTRAVENOUS | Status: DC
  Administered 2023-11-02: 5 ug/min via INTRAVENOUS
  Filled 2023-11-02: qty 250

## 2023-11-02 MED ORDER — HEPARIN BOLUS VIA INFUSION
5000.0000 [IU] | Freq: Once | INTRAVENOUS | Status: AC
  Administered 2023-11-02: 5000 [IU] via INTRAVENOUS
  Filled 2023-11-02: qty 5000

## 2023-11-02 NOTE — ED Notes (Signed)
 Critical lactic 3.5. MD Randol made aware.
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
## 2023-11-02 NOTE — ED Provider Notes (Signed)
 [place] EMERGENCY DEPARTMENT AT [facility] Provider Note   CSN: [id number] Arrival date & time: 11/02/23  1924     Patient presents with: Abdominal Pain   [name] is a 68 y.o. male.    Abdominal Pain    Patient has a history of hypertension acid reflux, hepatitis C, chronic pain, emphysema, COPD, hypertension, coronary artery disease, depression, gastric neoplasm.  Patient was transferred from [facility] to our emergency department.  Patient presented with abdominal pain and vomiting.  He was evaluated there and was found to have a possible bowel obstruction.  He had a CT scan that showed distended small bowel loops measuring up to 5 cm with a transition point concerning for possible internal herniation.  Patient also found to have evidence of severe AKI.  There was also concerns about possible pulmonary embolism.  Patient had an NG tube placed.  Patient had a CT scan of his chest without contrast.  No acute findings are noted in the chest.  The abdomen then findings were previously discussed.  Prior to Admission medications   Medication Sig Start Date End Date Taking? Authorizing Provider  albuterol  (VENTOLIN  HFA) 108 (90 Base) MCG/ACT inhaler Inhale 1-2 puffs into the lungs every 6 (six) hours as needed for shortness of breath or wheezing. 11/22/20   [provider]  amLODipine (NORVASC) 5 MG tablet Take 5 mg by mouth daily. 04/22/23   [provider]  Aspirin-Acetaminophen -Caffeine (GOODY HEADACHE PO) Take 1 packet by mouth daily as needed (Headache).    [provider]  BELBUCA 750 MCG FILM Take 900 mcg by mouth in the morning and at bedtime. 08/16/23   [provider]  buPROPion (WELLBUTRIN XL) 150 MG 24 hr tablet Take 150 mg by mouth every morning. 03/19/23   [provider]  dexamethasone  (DECADRON ) 4 MG tablet Take 2 tablets (8 mg total) by mouth daily. Start the day after chemotherapy for 2 days. Take with food.  08/30/23   Lanny Callander, MD  diphenoxylate -atropine  (LOMOTIL ) 2.5-0.025 MG tablet Take 1 tablet by mouth 4 (four) times daily as needed for diarrhea or loose stools. 10/10/23   Boscia, Heather E, NP  hydrochlorothiazide (HYDRODIURIL) 25 MG tablet Take 25 mg by mouth in the morning. 11/15/20   [provider]  lidocaine -prilocaine  (EMLA ) cream Apply to affected area once 08/30/23   Lanny Callander, MD  LINZESS 145 MCG CAPS capsule Take 145 mcg by mouth at bedtime. 11/16/20   [provider]  loperamide  (IMODIUM ) 2 MG capsule Take 2 mg by mouth as needed for diarrhea or loose stools.    [provider]  losartan (COZAAR) 100 MG tablet Take 100 mg by mouth daily with supper. 11/15/20   [provider]  metFORMIN (GLUCOPHAGE-XR) 500 MG 24 hr tablet Take 500 mg by mouth every evening. 11/15/20   [provider]  methocarbamol (ROBAXIN) 750 MG tablet Take 750 mg by mouth at bedtime. 11/24/20   [provider]  metoprolol (LOPRESSOR) 100 MG tablet Take 100 mg by mouth daily.    [provider]  Multiple Vitamins-Minerals (MULTIVITAMIN MEN 50+) TABS as directed Orally    [provider]  naloxone (NARCAN) nasal spray 4 mg/0.1 mL Place 1 spray into the nose as needed (opioid overdose).    [provider]  ondansetron  (ZOFRAN ) 8 MG tablet Take 1 tablet (8 mg total) by mouth every 8 (eight) hours as needed for nausea or vomiting. Start on the third day after  chemotherapy. 08/30/23   Lanny Callander, MD  ondansetron  (ZOFRAN -ODT) 8 MG disintegrating tablet Take 1 tablet (8 mg total) by mouth every 8 (eight) hours as needed for nausea or vomiting. 10/10/23   Hanford Powell BRAVO, NP  oxyCODONE -acetaminophen  (PERCOCET) 10-325 MG tablet Take 1 tablet by mouth every 4 (four) hours as needed for pain. 12/15/20   [provider]  pantoprazole  (PROTONIX ) 40 MG tablet Take 1 tablet (40 mg total) by mouth every morning. 1/2 to 1 hour prior to meal 09/05/23   Lanny Callander,  MD  potassium chloride  SA (KLOR-CON  M) 20 MEQ tablet Take 1 tablet (20 mEq total) by mouth daily for 7 days. 10/21/23 11/23/23  Boscia, Heather E, NP  pregabalin (LYRICA) 25 MG capsule Take 25 mg by mouth at bedtime. 12/15/20   [provider]  prochlorperazine  (COMPAZINE ) 10 MG tablet Take 1 tablet (10 mg total) by mouth every 6 (six) hours as needed for nausea or vomiting. 08/30/23   Lanny Callander, MD  rosuvastatin (CRESTOR) 20 MG tablet Take 20 mg by mouth daily.    [provider]  Vitamin D, Ergocalciferol, (DRISDOL) 1.25 MG (50000 UNIT) CAPS capsule Take 50,000 Units by mouth every 7 (seven) days. 10/11/20   [provider]    Allergies: Dilaudid [hydromorphone] and Lisinopril    Review of Systems  Gastrointestinal:  Positive for abdominal pain.    Updated Vital Signs BP (!) 79/50   Pulse (!) 137   Temp 98.8 F (37.1 C) (Axillary)   Resp (!) 31   Ht 1.803 m (5' 11)   Wt 84.4 kg   SpO2 98%   BMI 25.95 kg/m   Physical Exam Vitals and nursing note reviewed.  Constitutional:      General: He is in acute distress.     Appearance: He is well-developed. He is ill-appearing.  HENT:     Head: Normocephalic and atraumatic.     Right Ear: External ear normal.     Left Ear: External ear normal.   Eyes:     General: No scleral icterus.       Right eye: No discharge.        Left eye: No discharge.     Conjunctiva/sclera: Conjunctivae normal.   Neck:     Trachea: No tracheal deviation.   Cardiovascular:     Rate and Rhythm: Normal rate and regular rhythm.  Pulmonary:     Effort: Pulmonary effort is normal. No respiratory distress.     Breath sounds: Normal breath sounds. No stridor. No wheezing or rales.  Abdominal:     General: Bowel sounds are normal. There is no distension.     Palpations: Abdomen is soft.     Tenderness: There is no abdominal tenderness. There is no guarding or rebound.   Musculoskeletal:        General: No tenderness or deformity.      Cervical back: Neck supple.   Skin:    General: Skin is warm and dry.     Findings: No rash.   Neurological:     General: No focal deficit present.     Mental Status: He is alert.     Cranial Nerves: No cranial nerve deficit, dysarthria or facial asymmetry.     Sensory: No sensory deficit.     Motor: No abnormal muscle tone or seizure activity.     Coordination: Coordination normal.   Psychiatric:        Mood and Affect: Mood normal.     (  all labs ordered are listed, but only abnormal results are displayed) Labs Reviewed  LACTIC ACID, PLASMA - Abnormal; Notable for the following components:      Result Value   Lactic Acid, Venous 3.5 (*)    All other components within normal limits  COMPREHENSIVE METABOLIC PANEL WITH GFR - Abnormal; Notable for the following components:   Sodium 129 (*)    Chloride 95 (*)    CO2 19 (*)    Glucose, Bld 164 (*)    BUN 35 (*)    Creatinine, Ser 2.14 (*)    Calcium  8.7 (*)    Total Protein 5.9 (*)    Albumin 2.5 (*)    Alkaline Phosphatase 24 (*)    GFR, Estimated 33 (*)    All other components within normal limits  LACTIC ACID, PLASMA  CBC WITH DIFFERENTIAL/PLATELET  HEPARIN  LEVEL (UNFRACTIONATED)    EKG: None  Radiology: No results found.   .Critical Care  Performed by: Randol Simmonds, MD Authorized by: Randol Simmonds, MD   Critical care provider statement:    Critical care time (minutes):  45   Critical care was time spent personally by me on the following activities:  Development of treatment plan with patient or surrogate, discussions with consultants, evaluation of patient's response to treatment, examination of patient, ordering and review of laboratory studies, ordering and review of radiographic studies, ordering and performing treatments and interventions, pulse oximetry, re-evaluation of patient's condition and review of old charts    Medications Ordered in the ED  norepinephrine  (LEVOPHED ) 4mg  in (0.016 mg/mL)  premix infusion (10 mcg/min Intravenous Rate/Dose Change 11/02/23 2125)  fentaNYL  (SUBLIMAZE ) injection 50 mcg (0 mcg Intravenous Hold 11/02/23 2108)  diatrizoate  meglumine -sodium (GASTROGRAFIN ) 66-10 % solution 90 mL (has no administration in time range)  heparin  ADULT infusion 100 units/mL (25000 units/250mL) (1,400 Units/hr Intravenous New Bag/Given 11/02/23 2107)  lactated ringers  bolus 1,000 mL (has no administration in time range)  lactated ringers  bolus 1,000 mL (0 mLs Intravenous Stopped 11/02/23 2018)  lactated ringers  bolus 500 mL (500 mLs Intravenous New Bag/Given 11/02/23 2101)  heparin  bolus via infusion 5,000 Units (5,000 Units Intravenous Bolus from Bag 11/02/23 2107)    Clinical Course as of 11/02/23 2137  Sat Nov 02, 2023  1952 Case discussed with PCCM, Dr Volanda [JK]  1955 Case discussed with Dr. Rubin.  He will see patient in the ED [JK]  2037 Patient was assessed by pulmonary critical care.  They recommended admission to the hospitalist service.  Patient's blood pressure has improved.  Patient did not receive a full 30 cc/kg bolus at the other facility [JK]  2102 Case discussed with Dr. Fredirick.  Unfortunately patient's blood pressure has decreased again.  I will consult with critical care as he does not appear appropriate for stepdown at this time. [JK]    Clinical Course User Index [JK] Randol Simmonds, MD                                 Medical Decision Making Amount and/or Complexity of Data Reviewed Labs: ordered.  Risk Prescription drug management. Decision regarding hospitalization.  Labs reviewed from [facility].  Patient's electrolyte panel showed elevated BUN/creatinine of 34 and 2.0.  He had elevated lactic acid level and procalcitonin level.  Lactic acid was 5.1.  Patient's record indicate he was given cefepime and vancomycin and metronidazole.  Patient was treated for possible evolving sepsis.  Patient also noted to have an oxygen requirement.  They were  concerned about the possibility of pulmonary embolism.  Patient had a noncontrast CT that did not show any acute abnormality.  Will cover with heparin  at this time  Patient presented to the ED hypotensive and tachycardic.  He has been treated for evolving sepsis.  Patient also has evidence of a bowel obstruction.  I have ordered additional IV fluid bolus.  I have ordered peripheral pressors to maintain MAP greater than 70.  I have consulted with Dr. Rubin general surgery and Dr. Volanda.  Appreciate their assistance.  Pt will be admitted for further treatment  Pt see by PCCM.  Recommends medical admission.  DIscussed with PCCM as BP remains low.  Recommendation is for continued IV fluid hydration, pt did not receive the full 30 cc/kg bolus at the other hospital.  Additional fluids have been ordered to complete the 30/cc    Final diagnoses:  Intestinal obstruction, unspecified cause, unspecified whether partial or complete (HCC)  Malignant neoplasm of stomach, unspecified location (HCC)  Sepsis, due to unspecified organism, unspecified whether acute organ dysfunction present [facility])    ED Discharge Orders     None          Randol Simmonds, MD 11/02/23 2140
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
## 2023-11-02 NOTE — Progress Notes (Signed)
 Pharmacy Antibiotic Note  [name] is a 68 y.o. male with history of HTN, Hepatitis C, chronic pain, emphysema, COPD, CAD, gastric neoplasm. Patient from Outside hospital. CT Scan from there w/ distended small bowel loops measuring up to 5 cm with a transition point concerning for possible internal herniation. Surgery is following for SBO concerns.   Pharmacy has been consulted for cefepime, vancomycin, and flagyl dosing for sepsis. Patient received 2g vanco 1403; cefepime 2g 1403, and flagyl at [name] 6/21.  SCr 2.14 - AKI WBC pending; LA 3.5; T 98.8; HR 137; RR 31  Plan: Flagyl 500mg  q12h Cefepime 2g q12hr  Does not need repeat vancomycin dose tonight -- will dose per level unless renal function stable in AM Monitor WBC, fever, renal function, cultures De-escalate when able  Height: 5' 11 (180.3 cm) Weight: 84.4 kg (186 lb 1.1 oz) IBW/kg (Calculated) : 75.3  Temp (24hrs), Avg:98.8 F (37.1 C), Min:98.8 F (37.1 C), Max:98.8 F (37.1 C)  Recent Labs  Lab 11/02/23 2014  CREATININE 2.14*  LATICACIDVEN 3.5*    Estimated Creatinine Clearance: 35.7 mL/min (A) (by C-G formula based on SCr of 2.14 mg/dL (H)).    Allergies  Allergen Reactions   Dilaudid [Hydromorphone] Itching   Lisinopril Nausea Only   Microbiology results: Pending  Thank you for allowing pharmacy to be a part of this patient's care.  Dorn Buttner, PharmD, BCPS 11/02/2023 9:32 PM ED Clinical Pharmacist -  [phone]
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
## 2023-11-02 NOTE — Progress Notes (Addendum)
 Plan of Care Note for accepted transfer   Patient: [name] MRN: [id number]   DOA: (Not on file)  Facility requesting transfer: [facility] Requesting Provider: Dr. Cleotilde EDP Reason for transfer: Most of his care here, specialist there less comfortable with managing. Facility course:   Patient presenting with shortness of breath, tachycardia, fatigue with recent nausea vomiting diarrhea.  Has history of gastric cancer on neoadjuvant chemotherapy last treatment was the day prior to presentation.  Possibly having some chemotherapy side effects.  Does follow with surgeon as well, Dr. Dasie.  With plan for gastric resection at some point.  Workup there showed WBC 1.8, ANC 0.16.  Creatinine elevated to 2 from baseline of 0.9.  Magnesium  low.  proBNP elevated to 8900 with lactic acid of 5.1.  Procalcitonin elevated as well.  CT chest on pelvis showed known stomach malignancy as well as dilated loops of small bowel representing SBO with transition point.  Patient discussed with general surgery at [facility] and given patient's history of gastric cancer and cancer care at [facility] they stated they were comfortable with managing this case and recommended reaching out to general surgery here.  Case has been discussed with general surgery here, Dr. Ebbie, they agreed to see the patient in consultation if transferred.  They requested patient be sent to [facility] given his multiple other issues.  I did discuss patient's elevated lactic acid with EDP.  I stated that certainly there is concern for developing bowel ischemia without any obvious other source of infection and if lactic acid continues to rise despite IV fluids that have been administered that he reach out to their surgery team again to see if more urgent evaluation/intervention is needed.  If lactic acid is downtrending then patient will remain appropriate for transfer to progressive unit here for further evaluation by general surgery  here.  EDP at [facility], Dr. Cleotilde, stated he would update [facility] with patient's repeat lactic acid when it resulted.  Lactic acid was drawn up 25 minutes prior to my discussion with EDP and should be back in another half hour or so.  I requested that patient not be transferred until after repeat lactic acid is resulted so appropriate decision can be made.  Plan of care: The patient is accepted for admission to Progressive unit, at [facility].   To address SBO in the setting of known gastric cancer with nausea, vomiting, diarrhea, AKI.  Spoke with EDP who took over for Dr. Cleotilde who stated on their review of the imaging there was confirmed for internal herniation and they felt like patient may benefit from a more urgent surgery evaluation.  Lactic acid has come back and is improving with IV fluids so lower suspicion for ischemic bowel at this time.  Recommend [facility] EDP speak again with general surgery for their recommendations and if patient needs to be seen sooner to transfer patient ED to ED in consultation with our ED department so that surgical evaluation can be completed sooner.  CT read states transition point within an area of peritoneal internal herniation and vascular swirling in the right abdomen.  Plan will still be for hospitalist team to admit patient to progressive bed even if patient is transferred ED to ED.    Author: Marsa KATHEE Scurry, MD 11/02/2023  Check [email or web address] for on-call coverage.  Nursing staff, Please call [facility] Admits & Consults System-Wide number on Amion as soon as patient's arrival, so appropriate admitting provider can evaluate the pt.

## 2023-11-02 NOTE — ED Triage Notes (Signed)
 Pt BIB [facility] from [facility].  Pt is a transfer d/t Bowel Obstruction.  Pt is a Cancer patient.

## 2023-11-03 ENCOUNTER — Inpatient Hospital Stay (HOSPITAL_COMMUNITY)

## 2023-11-03 ENCOUNTER — Other Ambulatory Visit (HOSPITAL_COMMUNITY)

## 2023-11-03 ENCOUNTER — Other Ambulatory Visit: Payer: Self-pay

## 2023-11-03 DIAGNOSIS — R6521 Severe sepsis with septic shock: Secondary | ICD-10-CM | POA: Diagnosis not present

## 2023-11-03 DIAGNOSIS — K56609 Unspecified intestinal obstruction, unspecified as to partial versus complete obstruction: Secondary | ICD-10-CM | POA: Diagnosis not present

## 2023-11-03 DIAGNOSIS — J9691 Respiratory failure, unspecified with hypoxia: Secondary | ICD-10-CM | POA: Insufficient documentation

## 2023-11-03 DIAGNOSIS — D709 Neutropenia, unspecified: Secondary | ICD-10-CM | POA: Diagnosis present

## 2023-11-03 DIAGNOSIS — I4891 Unspecified atrial fibrillation: Secondary | ICD-10-CM

## 2023-11-03 DIAGNOSIS — N179 Acute kidney failure, unspecified: Secondary | ICD-10-CM

## 2023-11-03 DIAGNOSIS — R7989 Other specified abnormal findings of blood chemistry: Secondary | ICD-10-CM | POA: Diagnosis not present

## 2023-11-03 DIAGNOSIS — A419 Sepsis, unspecified organism: Secondary | ICD-10-CM | POA: Diagnosis not present

## 2023-11-03 HISTORY — DX: Respiratory failure, unspecified with hypoxia: J96.91

## 2023-11-03 HISTORY — DX: Neutropenia, unspecified: D70.9

## 2023-11-03 LAB — TROPONIN I (HIGH SENSITIVITY)
Troponin I (High Sensitivity): 102 ng/L (ref <18)
Troponin I (High Sensitivity): 96 ng/L — ABNORMAL HIGH (ref <18)

## 2023-11-03 LAB — ECHOCARDIOGRAM COMPLETE
AR max vel: 2.48 cm2
AV Area VTI: 3.74 cm2
AV Area mean vel: 3.08 cm2
AV Mean grad: 2 mmHg
AV Peak grad: 3.2 mmHg
Ao pk vel: 0.9 m/s
Area-P 1/2: 4.63 cm2
Est EF: >75
Height: 71 in
S' Lateral: 2.4 cm
Weight: 2977.09 [oz_av]

## 2023-11-03 LAB — HIV ANTIBODY (ROUTINE TESTING W REFLEX): HIV Screen 4th Generation wRfx: NONREACTIVE

## 2023-11-03 LAB — LACTIC ACID, PLASMA
Lactic Acid, Venous: 1.7 mmol/L (ref 0.5–1.9)
Lactic Acid, Venous: 1.9 mmol/L (ref 0.5–1.9)

## 2023-11-03 LAB — CBC
HCT: 32.1 % — ABNORMAL LOW (ref 39.0–52.0)
Hemoglobin: 11.3 g/dL — ABNORMAL LOW (ref 13.0–17.0)
MCH: 32.8 pg (ref 26.0–34.0)
MCHC: 35.2 g/dL (ref 30.0–36.0)
MCV: 93 fL (ref 80.0–100.0)
Platelets: 168 10*3/uL (ref 150–400)
RBC: 3.45 MIL/uL — ABNORMAL LOW (ref 4.22–5.81)
RDW: 14.4 % (ref 11.5–15.5)
WBC: 1.6 10*3/uL — ABNORMAL LOW (ref 4.0–10.5)
nRBC: 0 % (ref 0.0–0.2)

## 2023-11-03 LAB — BASIC METABOLIC PANEL WITH GFR
Anion gap: 14 (ref 5–15)
BUN: 33 mg/dL — ABNORMAL HIGH (ref 8–23)
CO2: 20 mmol/L — ABNORMAL LOW (ref 22–32)
Calcium: 8.4 mg/dL — ABNORMAL LOW (ref 8.9–10.3)
Chloride: 95 mmol/L — ABNORMAL LOW (ref 98–111)
Creatinine, Ser: 1.5 mg/dL — ABNORMAL HIGH (ref 0.61–1.24)
GFR, Estimated: 51 mL/min — ABNORMAL LOW (ref >60)
Glucose, Bld: 139 mg/dL — ABNORMAL HIGH (ref 70–99)
Potassium: 3.4 mmol/L — ABNORMAL LOW (ref 3.5–5.1)
Sodium: 129 mmol/L — ABNORMAL LOW (ref 135–145)

## 2023-11-03 LAB — HEPARIN LEVEL (UNFRACTIONATED)
Heparin Unfractionated: 0.74 [IU]/mL — ABNORMAL HIGH (ref 0.30–0.70)
Heparin Unfractionated: 0.27 [IU]/mL — ABNORMAL LOW (ref 0.30–0.70)

## 2023-11-03 LAB — MRSA NEXT GEN BY PCR, NASAL: MRSA by PCR Next Gen: NOT DETECTED

## 2023-11-03 LAB — BRAIN NATRIURETIC PEPTIDE: B Natriuretic Peptide: 243.5 pg/mL — ABNORMAL HIGH (ref 0.0–100.0)

## 2023-11-03 LAB — GLUCOSE, CAPILLARY
Glucose-Capillary: 116 mg/dL — ABNORMAL HIGH (ref 70–99)
Glucose-Capillary: 125 mg/dL — ABNORMAL HIGH (ref 70–99)

## 2023-11-03 LAB — MAGNESIUM: Magnesium: 1.8 mg/dL (ref 1.7–2.4)

## 2023-11-03 LAB — VANCOMYCIN, RANDOM: Vancomycin Rm: 13 ug/mL

## 2023-11-03 LAB — PHOSPHORUS: Phosphorus: 2.9 mg/dL (ref 2.5–4.6)

## 2023-11-03 MED ORDER — MORPHINE SULFATE (PF) 2 MG/ML IV SOLN
1.0000 mg | INTRAVENOUS | Status: DC | PRN
  Administered 2023-11-03: 1 mg via INTRAVENOUS
  Filled 2023-11-03: qty 1

## 2023-11-03 MED ORDER — SODIUM CHLORIDE 0.9 % IV SOLN
2.0000 g | Freq: Three times a day (TID) | INTRAVENOUS | Status: DC
  Administered 2023-11-03: 2 g via INTRAVENOUS
  Filled 2023-11-03: qty 12.5

## 2023-11-03 MED ORDER — METRONIDAZOLE 500 MG/100ML IV SOLN
500.0000 mg | Freq: Two times a day (BID) | INTRAVENOUS | Status: DC
  Administered 2023-11-03 (×2): 500 mg via INTRAVENOUS
  Filled 2023-11-03 (×2): qty 100

## 2023-11-03 MED ORDER — FENTANYL CITRATE PF 50 MCG/ML IJ SOSY
25.0000 ug | PREFILLED_SYRINGE | Freq: Once | INTRAMUSCULAR | Status: AC
  Administered 2023-11-03: 25 ug via INTRAVENOUS
  Filled 2023-11-03: qty 1

## 2023-11-03 MED ORDER — SODIUM CHLORIDE 0.9 % IV SOLN
2.0000 g | Freq: Two times a day (BID) | INTRAVENOUS | Status: DC
  Administered 2023-11-04: 2 g via INTRAVENOUS
  Filled 2023-11-03: qty 12.5

## 2023-11-03 MED ORDER — ORAL CARE MOUTH RINSE: 15.0000 mL | OROMUCOSAL | Status: DC | PRN

## 2023-11-03 MED ORDER — IOHEXOL 350 MG/ML SOLN
60.0000 mL | Freq: Once | INTRAVENOUS | Status: AC | PRN
  Administered 2023-11-03: 60 mL via INTRAVENOUS

## 2023-11-03 MED ORDER — IOHEXOL 350 MG/ML SOLN
75.0000 mL | Freq: Once | INTRAVENOUS | Status: DC | PRN
Start: 2023-11-03 — End: 2023-11-03

## 2023-11-03 MED ORDER — AMIODARONE HCL IN DEXTROSE 360-4.14 MG/200ML-% IV SOLN
INTRAVENOUS | Status: AC
  Administered 2023-11-03: 150 mg via INTRAVENOUS
  Filled 2023-11-03: qty 200

## 2023-11-03 MED ORDER — AMIODARONE HCL IN DEXTROSE 360-4.14 MG/200ML-% IV SOLN
30.0000 mg/h | INTRAVENOUS | Status: DC
  Administered 2023-11-04: 30 mg/h via INTRAVENOUS
  Filled 2023-11-03 (×2): qty 200

## 2023-11-03 MED ORDER — AMIODARONE LOAD VIA INFUSION
150.0000 mg | Freq: Once | INTRAVENOUS | Status: AC
  Filled 2023-11-03: qty 83.34

## 2023-11-03 MED ORDER — CHLORHEXIDINE GLUCONATE CLOTH 2 % EX PADS
6.0000 | MEDICATED_PAD | Freq: Every day | CUTANEOUS | Status: DC
  Administered 2023-11-03 – 2023-11-11 (×9): 6 via TOPICAL

## 2023-11-03 MED ORDER — HYDROMORPHONE HCL 1 MG/ML IJ SOLN
1.0000 mg | INTRAMUSCULAR | Status: DC | PRN
  Administered 2023-11-03: 1 mg via INTRAVENOUS
  Filled 2023-11-03: qty 1

## 2023-11-03 MED ORDER — POTASSIUM CHLORIDE 10 MEQ/100ML IV SOLN
10.0000 meq | INTRAVENOUS | Status: AC
  Administered 2023-11-03 (×4): 10 meq via INTRAVENOUS
  Filled 2023-11-03 (×4): qty 100

## 2023-11-03 MED ORDER — HYDROMORPHONE HCL 1 MG/ML IJ SOLN
0.5000 mg | INTRAMUSCULAR | Status: DC | PRN
  Administered 2023-11-03 (×3): 0.5 mg via INTRAVENOUS
  Administered 2023-11-03 – 2023-11-09 (×13): 1 mg via INTRAVENOUS
  Filled 2023-11-03 (×15): qty 1

## 2023-11-03 MED ORDER — VANCOMYCIN HCL 1750 MG/350ML IV SOLN
1750.0000 mg | Freq: Every day | INTRAVENOUS | Status: DC
  Administered 2023-11-03: 1750 mg via INTRAVENOUS
  Filled 2023-11-03 (×2): qty 350

## 2023-11-03 MED ORDER — LACTATED RINGERS IV SOLN: INTRAVENOUS | Status: AC

## 2023-11-03 MED ORDER — HEPARIN (PORCINE) 25000 UT/250ML-% IV SOLN
1500.0000 [IU]/h | INTRAVENOUS | Status: DC
  Administered 2023-11-03: 1300 [IU]/h via INTRAVENOUS
  Administered 2023-11-04 – 2023-11-05 (×2): 1400 [IU]/h via INTRAVENOUS
  Filled 2023-11-03 (×3): qty 250

## 2023-11-03 MED ORDER — LACTATED RINGERS IV BOLUS
500.0000 mL | Freq: Once | INTRAVENOUS | Status: AC
  Administered 2023-11-03: 500 mL via INTRAVENOUS

## 2023-11-03 MED ORDER — AMIODARONE HCL IN DEXTROSE 360-4.14 MG/200ML-% IV SOLN
60.0000 mg/h | INTRAVENOUS | Status: DC
  Administered 2023-11-03 (×2): 60 mg/h via INTRAVENOUS

## 2023-11-03 NOTE — Assessment & Plan Note (Signed)
 Holding all BP meds due to hypotension

## 2023-11-03 NOTE — Progress Notes (Addendum)
 Asked to move patient to the ICU after several low BP readings and peripheral norepi initiated   Pt has an AKI, however is up to levophed  and has a significant O2 requirement in the setting of malignancy, think it is prudent to evaluate for PE.  Attempted POCUS to evaluate the RV, but was unable to obtain windows.  Leita SAUNDERS Taquana Bartley, PA-C

## 2023-11-03 NOTE — Assessment & Plan Note (Signed)
 Due to a combination of chemotherapy and sepsis Continue broad-spectrum antibiotics  Care per CCM

## 2023-11-03 NOTE — Plan of Care (Signed)
 Patient converted to Sinus rate 103-106

## 2023-11-03 NOTE — Assessment & Plan Note (Addendum)
 With hypotension despite aggressive IV fluid resuscitation Per CCM On pressors Pancultures pending He received broad-spectrum antibiotics at outside hospital Patient has an indwelling catheter and is on chemo. Also with markedly elevated lactic acidosis at 5.1 now down to 3.5 Patient with low WBC at 1.7

## 2023-11-03 NOTE — Assessment & Plan Note (Signed)
 Patient for CT chest with contrast to rule out PE On heparin  drip until results are back. Oxygen per mask increase as needed

## 2023-11-03 NOTE — Progress Notes (Signed)
 Attempted Echocardiogram, I was in the room, set up and ready to scan. The charge nurse came in and said she needs to move the patient, I asked for 20 minutes, she said she absolutely cannot allow any time for the Echo.
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
## 2023-11-03 NOTE — Assessment & Plan Note (Signed)
 Albuterol  as needed Oxygen as needed

## 2023-11-03 NOTE — ED Notes (Signed)
 Dr. Kevin made aware of troponin.

## 2023-11-03 NOTE — Assessment & Plan Note (Signed)
 On Wellbutrin
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
## 2023-11-03 NOTE — ED Notes (Addendum)
 Critical Troponin 102. Will make MD aware.

## 2023-11-03 NOTE — Assessment & Plan Note (Signed)
 Continue PPI.

## 2023-11-03 NOTE — Assessment & Plan Note (Signed)
 On statin at home Resume as we can

## 2023-11-03 NOTE — Progress Notes (Signed)
 PHARMACY - ANTICOAGULATION CONSULT NOTE  Pharmacy Consult for heparin  Indication: r/o PE  Labs: Recent Labs    11/02/23 2014 11/03/23 0027 11/03/23 0206 11/03/23 0300  HGB 11.7*  --  11.3*  --   HCT 32.6*  --  32.1*  --   PLT 178  --  168  --   HEPARINUNFRC  --   --   --  0.74*  CREATININE 2.14*  --  1.50*  --   TROPONINIHS  --  102*  --   --    Assessment: 68yo male supratherapeutic on heparin  with initial dosing for possible PE though bolus may still be having an effect on level; no infusion issues or signs of bleeding per RN.  Goal of Therapy:  Heparin  level 0.3-0.7 units/ml   Plan:  Decrease heparin  infusion by ~1 unit/kg/hr to 1300 units/hr. Check level in 8 hours.   Marvetta Dauphin, PharmD, BCPS 11/03/2023 4:21 AM

## 2023-11-03 NOTE — Assessment & Plan Note (Signed)
Currently on chemo.

## 2023-11-03 NOTE — Assessment & Plan Note (Signed)
 Acute on chronic renal insufficiency Suspect patient is quite dehydrated with ongoing sepsis

## 2023-11-03 NOTE — ED Notes (Signed)
 1850cc dark green output removed from NG canister.

## 2023-11-03 NOTE — ED Notes (Signed)
 Patient transported to CT

## 2023-11-03 NOTE — Hospital Course (Signed)
 Patient is a 68 year old male with history of HTN, CAD, COPD, hep C, chronic pain and gastric cancer on chemo, with Port-A-Cath in place who presented to [facility] with abdominal pain and vomiting.  On evaluation at [facility] he was noted to have a small bowel obstruction with possible internal herniation.  He was noted to have neutropenia with a white count of 1.7, lactic acid of 5.1, and new oxygen requirement.  An NG tube was placed and the patient had CT of the chest without contrast due to his kidney injury.  No PE was noted.  He also had elevated BNP and so IV fluid resuscitation was not begun.  After transition here he was found to be hypotensive and given IV fluid resuscitation.  His lactic acid dropped from 5.1-3.5 but he remained quite hypotensive.  He was evaluated by critical care who deferred admission and thought he might be stable for stepdown.  He continues to be markedly tachycardic as well as hypotensive with an oxygen requirement.  Plans were made to admit to critical care medicine.

## 2023-11-03 NOTE — Assessment & Plan Note (Signed)
 With transition point General Surgery to see and assess
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
## 2023-11-03 NOTE — Progress Notes (Signed)
 PCCM Progress Note  On arrival to ICU patient was seen with further tachycardia, 12 lead EKG obtained and confirmed A-FIB RVR. Amio drip and bolus ordered.   [name] D. Harris, NP-C [facility] Personal contact information can be found on Amion  If no contact or response made please call [phone] 11/03/2023, 11:22 AM

## 2023-11-03 NOTE — Progress Notes (Signed)
 Pharmacy Antibiotic Note  [name] is a 68 y.o. male admitted on 11/02/2023 with septic shock and leukopenia.  Pharmacy has been consulted for cefepime/metronidazole/vancomycin dosing.  Received 2 g vanco 1403; cefepime 2g 1403, and flagyl at [facility] 6/21 - Scr elevated at 2.14 last evening, improved to 1.50. Obtained vanc random level at 13 confirming patient's renal function is improving and able to clear. Based on single level kinetics: Ke = 0.057 Vd = 60.8 T1/2 = 12 TDD to achieve AUC of 500 = 1743  Plan: Vancomycin 1750 mg IV q24h Cefepime 2 g IV q8h Continue metronidazole at current dose F/u cultures, source, DoT, level at steady state Monitor renal function, UOP/Scr for dose adjustments  Height: 5' 11 (180.3 cm) Weight: 84.4 kg (186 lb 1.1 oz) IBW/kg (Calculated) : 75.3  Temp (24hrs), Avg:98.5 F (36.9 C), Min:98 F (36.7 C), Max:99 F (37.2 C)  Recent Labs  Lab 11/02/23 2014 11/03/23 0027 11/03/23 0206 11/03/23 0901 11/03/23 1222  WBC 1.7*  --  1.6*  --   --   CREATININE 2.14*  --  1.50*  --   --   LATICACIDVEN 3.5* 1.9  --  1.7  --   VANCORANDOM  --   --   --   --  13    Estimated Creatinine Clearance: 50.9 mL/min (A) (by C-G formula based on SCr of 1.5 mg/dL (H)).    Allergies  Allergen Reactions   Dilaudid [Hydromorphone] Itching   Lisinopril Nausea Only    Antimicrobials this admission: Vanc 6/21 >> Cefepime 6/21 >> Flagyl 6/21 >>  Microbiology results: 6/21 MRSA PCR: negative  Thank you for allowing pharmacy to be a part of this patient's care.  Randall Call, PharmD, BCPS 11/03/23 1:57 PM

## 2023-11-03 NOTE — Progress Notes (Addendum)
 Subjective: Patient states he has been having N/V/D for 4 days and abdominal pain with that.  He is still having some abdominal pain.  Currently NGT is decompressing with almost 2L already removed.  ROS: See above, otherwise other systems negative  Objective: Vital signs in last 24 hours: Temp:  [98 F (36.7 C)-98.8 F (37.1 C)] 98.5 F (36.9 C) (06/22 0426) Pulse Rate:  [50-141] 126 (06/22 0800) Resp:  [17-36] 32 (06/22 0800) BP: (69-149)/(50-90) 149/80 (06/22 0800) SpO2:  [72 %-100 %] 97 % (06/22 0800) FiO2 (%):  [30 %] 30 % (06/21 1924) Weight:  [84.4 kg] 84.4 kg (06/21 2039)    Intake/Output from previous day: 06/21 0701 - 06/22 0700 In: 3736.6 [I.V.:136.6; IV Piggyback:3600] Out: -  Intake/Output this shift: No intake/output data recorded.  PE: Gen: venti-mask in place, NAD Heart: irregular, tachy in 130s, PAC in place and does not appear erythematous Lungs: intermittent audible wheezing noted while in the room, but then clear upon auscultation.  Venti-mask in place Abd: soft, tender in upper abdomen with some voluntary guarding, NGT returned to suction and has pulled about 1600cc of bilious output currently and still draining.  No peritonitis currently   Lab Results:  Recent Labs    11/02/23 2014 11/03/23 0206  WBC 1.7* 1.6*  HGB 11.7* 11.3*  HCT 32.6* 32.1*  PLT 178 168   BMET Recent Labs    11/02/23 2014 11/03/23 0206  NA 129* 129*  K 3.6 3.4*  CL 95* 95*  CO2 19* 20*  GLUCOSE 164* 139*  BUN 35* 33*  CREATININE 2.14* 1.50*  CALCIUM  8.7* 8.4*   PT/INR No results for input(s): LABPROT, INR in the last 72 hours. CMP     Component Value Date/Time   NA 129 (L) 11/03/2023 0206   K 3.4 (L) 11/03/2023 0206   CL 95 (L) 11/03/2023 0206   CO2 20 (L) 11/03/2023 0206   GLUCOSE 139 (H) 11/03/2023 0206   BUN 33 (H) 11/03/2023 0206   CREATININE 1.50 (H) 11/03/2023 0206   CREATININE 0.85 10/10/2023 0824   CALCIUM  8.4 (L) 11/03/2023 0206    PROT 5.9 (L) 11/02/2023 2014   ALBUMIN 2.5 (L) 11/02/2023 2014   AST 26 11/02/2023 2014   AST 15 10/10/2023 0824   ALT 13 11/02/2023 2014   ALT 11 10/10/2023 0824   ALKPHOS 24 (L) 11/02/2023 2014   BILITOT 1.1 11/02/2023 2014   BILITOT 0.5 10/10/2023 0824   GFRNONAA 51 (L) 11/03/2023 0206   GFRNONAA >60 10/10/2023 0824   GFRAA  10/16/2007 1246    >60        The eGFR has been calculated using the MDRD equation. This calculation has not been validated in all clinical   Lipase  No results found for: LIPASE     Studies/Results: DG Abd Portable 1V-Small Bowel Obstruction Protocol-initial, 8 hr delay Result Date: 11/03/2023 EXAM: 1 VIEW XRAY OF THE ABDOMEN SUPINE 11/03/2023 06:03:00 AM COMPARISON: CT of the abdomen and pelvis 11/03/2023 at 01:30 AM. CLINICAL HISTORY: Small bowel obstruction 8 hr delay. FINDINGS: BOWEL: Oral contrast has progressed some into dilated proximal small bowel. Majority of contrast remains in the stomach. PERITONEUM AND SOFT TISSUES: Free air is present. BONES: No acute osseous abnormality. IMPRESSION: 1. Small bowel obstruction with some progression of oral contrast into dilated proximal small bowel The majority of contrast remains in the stomach. 2. Free air. Electronically signed by: Lonni Necessary MD 11/03/2023 06:22 AM EDT RP  Workstation: [id number]   CT Angio Chest Pulmonary Embolism (PE) W or WO Contrast Result Date: 11/03/2023 CLINICAL DATA:  Pulmonary embolism (PE) suspected, high prob; Bowel obstruction suspected. Gastric cancer. * Tracking Code: BO * EXAM: CT ANGIOGRAPHY CHEST CT ABDOMEN AND PELVIS WITH CONTRAST TECHNIQUE: Multidetector CT imaging of the chest was performed using the standard protocol during bolus administration of intravenous contrast. Multiplanar CT image reconstructions and MIPs were obtained to evaluate the vascular anatomy. Multidetector CT imaging of the abdomen and pelvis was performed using the standard protocol during bolus  administration of intravenous contrast. RADIATION DOSE REDUCTION: This exam was performed according to the departmental dose-optimization program which includes automated exposure control, adjustment of the mA and/or kV according to patient size and/or use of iterative reconstruction technique. CONTRAST:  60mL OMNIPAQUE  IOHEXOL  350 MG/ML SOLN COMPARISON:  11/02/2023 FINDINGS: CTA CHEST FINDINGS Cardiovascular: There is adequate opacification of the pulmonary arterial tree through the segmental level and there is no intraluminal filling defect identified to suggest acute pulmonary embolism. Central pulmonary arteries are of normal caliber. Extensive multi-vessel coronary artery calcification. Global cardiac size within normal limits. No pericardial effusion. There is fusiform dilation of the thoracic aorta measuring 4.0 cm in diameter in its ascending segment and 3.1 cm in diameter in its proximal descending segment. Moderate superimposed atherosclerotic calcification. Right subclavian chest port tip seen within the superior vena cava. Mediastinum/Nodes: Nasogastric tube tip seen within the proximal body of the stomach. Known gastric cardia mass is not well appreciated on this examination. Esophagus otherwise unremarkable. Visualized thyroid  is unremarkable. No pathologic thoracic adenopathy. Lungs/Pleura: Mild emphysema. Lungs otherwise clear. No pneumothorax or pleural effusion. Musculoskeletal: Multiple healed right rib fractures and right scapular fracture. No acute bone abnormality. No lytic or blastic bone lesion. Review of the MIP images confirms the above findings. CT ABDOMEN and PELVIS FINDINGS Hepatobiliary: No focal liver abnormality is seen. No gallstones, gallbladder wall thickening, or biliary dilatation. Pancreas: Unremarkable Spleen: Unremarkable Adrenals/Urinary Tract: Adrenal glands are unremarkable. Simple cortical cysts are seen within the right kidney for which no follow-up imaging is  recommended. The kidneys are otherwise unremarkable. Bladder unremarkable. Stomach/Bowel: Since the prior examination, the degree of gastric and proximal small bowel dilation has improved, though has not yet completely resolved. Exact point of transition is not clearly identified, however, dilated small bowel extends into the right upper quadrant, anterior and superior to the hepatic flexure of the colon, where there is transition to more normal caliber bowel suggesting a potential internal hernia in this location. The mid and distal small bowel as well as the large bowel are decompressed. No free intraperitoneal gas or fluid. Mild descending colonic diverticulosis again noted. Appendix unremarkable. Vascular/Lymphatic: Extensive aortoiliac atherosclerotic calcification. 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 Stable shotty retroperitoneal adenopathy nonspecific. No frankly pathologic adenopathy within the abdomen and pelvis. Reproductive: Prostate is unremarkable. Other: No abdominal wall hernia or abnormality. No abdominopelvic ascites. Musculoskeletal: No acute bone abnormality. No lytic or blastic bone lesion. Osseous structures are age appropriate. Review of the MIP images confirms the above findings. IMPRESSION: 1. No acute pulmonary embolism. No acute intrathoracic pathology identified. 2. Extensive multi-vessel coronary artery calcification. 3. Fusiform dilation of the thoracic aorta measuring up to 4.0 cm in its ascending segment. Recommend annual imaging followup by CTA or MRA. This recommendation follows 2010 ACCF/AHA/AATS/ACR/ASA/SCA/SCAI/SIR/STS/SVM Guidelines for the Diagnosis and Management of Patients with Thoracic Aortic Disease. Circulation. 2010; 121: Z733-z630. Aortic aneurysm NOS (ICD10-I71.9) 4. Interval nasogastric intubation. Interval improvement  in gastric and proximal small bowel dilation, though not yet completely resolved. Exact point of transition is not clearly  identified, however, dilated small bowel extends into the right upper quadrant, anterior and superior to the hepatic flexure of the colon, where there is transition to more normal caliber bowel suggesting a potential internal hernia in this location. 5. Mild descending colonic diverticulosis. 6. 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 Aortic Atherosclerosis (ICD10-I70.0). Electronically Signed   By: Dorethia Molt M.D.   On: 11/03/2023 02:17   CT ABDOMEN PELVIS W CONTRAST Result Date: 11/03/2023 CLINICAL DATA:  Pulmonary embolism (PE) suspected, high prob; Bowel obstruction suspected. Gastric cancer. * Tracking Code: BO * EXAM: CT ANGIOGRAPHY CHEST CT ABDOMEN AND PELVIS WITH CONTRAST TECHNIQUE: Multidetector CT imaging of the chest was performed using the standard protocol during bolus administration of intravenous contrast. Multiplanar CT image reconstructions and MIPs were obtained to evaluate the vascular anatomy. Multidetector CT imaging of the abdomen and pelvis was performed using the standard protocol during bolus administration of intravenous contrast. RADIATION DOSE REDUCTION: This exam was performed according to the departmental dose-optimization program which includes automated exposure control, adjustment of the mA and/or kV according to patient size and/or use of iterative reconstruction technique. CONTRAST:  60mL OMNIPAQUE  IOHEXOL  350 MG/ML SOLN COMPARISON:  11/02/2023 FINDINGS: CTA CHEST FINDINGS Cardiovascular: There is adequate opacification of the pulmonary arterial tree through the segmental level and there is no intraluminal filling defect identified to suggest acute pulmonary embolism. Central pulmonary arteries are of normal caliber. Extensive multi-vessel coronary artery calcification. Global cardiac size within normal limits. No pericardial effusion. There is fusiform dilation of the thoracic aorta measuring 4.0 cm in diameter in its ascending segment and 3.1  cm in diameter in its proximal descending segment. Moderate superimposed atherosclerotic calcification. Right subclavian chest port tip seen within the superior vena cava. Mediastinum/Nodes: Nasogastric tube tip seen within the proximal body of the stomach. Known gastric cardia mass is not well appreciated on this examination. Esophagus otherwise unremarkable. Visualized thyroid  is unremarkable. No pathologic thoracic adenopathy. Lungs/Pleura: Mild emphysema. Lungs otherwise clear. No pneumothorax or pleural effusion. Musculoskeletal: Multiple healed right rib fractures and right scapular fracture. No acute bone abnormality. No lytic or blastic bone lesion. Review of the MIP images confirms the above findings. CT ABDOMEN and PELVIS FINDINGS Hepatobiliary: No focal liver abnormality is seen. No gallstones, gallbladder wall thickening, or biliary dilatation. Pancreas: Unremarkable Spleen: Unremarkable Adrenals/Urinary Tract: Adrenal glands are unremarkable. Simple cortical cysts are seen within the right kidney for which no follow-up imaging is recommended. The kidneys are otherwise unremarkable. Bladder unremarkable. Stomach/Bowel: Since the prior examination, the degree of gastric and proximal small bowel dilation has improved, though has not yet completely resolved. Exact point of transition is not clearly identified, however, dilated small bowel extends into the right upper quadrant, anterior and superior to the hepatic flexure of the colon, where there is transition to more normal caliber bowel suggesting a potential internal hernia in this location. The mid and distal small bowel as well as the large bowel are decompressed. No free intraperitoneal gas or fluid. Mild descending colonic diverticulosis again noted. Appendix unremarkable. Vascular/Lymphatic: Extensive aortoiliac atherosclerotic calcification. 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 Stable shotty retroperitoneal adenopathy  nonspecific. No frankly pathologic adenopathy within the abdomen and pelvis. Reproductive: Prostate is unremarkable. Other: No abdominal wall hernia or abnormality. No abdominopelvic ascites. Musculoskeletal: No acute bone abnormality. No lytic or blastic bone lesion. Osseous  structures are age appropriate. Review of the MIP images confirms the above findings. IMPRESSION: 1. No acute pulmonary embolism. No acute intrathoracic pathology identified. 2. Extensive multi-vessel coronary artery calcification. 3. Fusiform dilation of the thoracic aorta measuring up to 4.0 cm in its ascending segment. Recommend annual imaging followup by CTA or MRA. This recommendation follows 2010 ACCF/AHA/AATS/ACR/ASA/SCA/SCAI/SIR/STS/SVM Guidelines for the Diagnosis and Management of Patients with Thoracic Aortic Disease. Circulation. 2010; 121: [id number]. Aortic aneurysm NOS (ICD10-I71.9) 4. Interval nasogastric intubation. Interval improvement in gastric and proximal small bowel dilation, though not yet completely resolved. Exact point of transition is not clearly identified, however, dilated small bowel extends into the right upper quadrant, anterior and superior to the hepatic flexure of the colon, where there is transition to more normal caliber bowel suggesting a potential internal hernia in this location. 5. Mild descending colonic diverticulosis. 6. 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 Aortic Atherosclerosis (ICD10-I70.0). Electronically Signed   By: Dorethia Molt M.D.   On: 11/03/2023 02:17   DG CHEST PORT 1 VIEW Result Date: 11/02/2023 CLINICAL DATA:  Hypoxia EXAM: PORTABLE CHEST 1 VIEW COMPARISON:  Chest x-ray 11/02/2023 FINDINGS: Enteric tube tip is in the body of the stomach. Right-sided chest port catheter tip projects over the SVC. There is a small right pleural effusion with minimal patchy opacities in the right lung base. The cardiomediastinal silhouette is within normal limits. No  pneumothorax. No acute fracture. IMPRESSION: Small right pleural effusion with minimal patchy opacities in the right lung base, likely atelectasis. Electronically Signed   By: Greig Pique M.D.   On: 11/02/2023 23:00   DG Abd Portable 1V-Small Bowel Protocol-Position Verification Result Date: 11/02/2023 CLINICAL DATA:  Tube placement. EXAM: PORTABLE ABDOMEN - 1 VIEW COMPARISON:  6:10 p.m. FINDINGS: Imaged including the upper abdomen and lower chest demonstrates an NG tube. The tip is superimposed with left upper quadrant, below the diaphragm. IMPRESSION: N/OGT in place. Electronically Signed   By: Fonda Field M.D.   On: 11/02/2023 21:46    Anti-infectives: Anti-infectives (From admission, onward)    Start     Dose/Rate Route Frequency Ordered Stop   11/03/23 2200  metroNIDAZOLE (FLAGYL) IVPB 500 mg        500 mg 100 mL/hr over 60 Minutes Intravenous Every 12 hours 11/02/23 2137     11/03/23 0200  ceFEPIme (MAXIPIME) 2 g in sodium chloride  0.9 % 100 mL IVPB        2 g 200 mL/hr over 30 Minutes Intravenous Every 12 hours 11/02/23 2137     11/02/23 2137  vancomycin variable dose per unstable renal function (pharmacist dosing)         Does not apply See admin instructions 11/02/23 2137          Assessment/Plan Gastric adenocarcinoma of cardia with SBO, ? Pneumoperitoneum -8-hr delay film with ? Of free air.  In review of CT and plain film they appear similar and no free was noted on CT scan; however, given patient's presentation and presence of contrast in his gut right now from gastrografin , will repeat CT scan to rule out new free air.  If this is + will likely need OR. -cont NGT for now -abx per primary  -patient never followed up with Dr. Dasie after dx lap.  Today he states he is unsure who that is and that he doesn't have follow up.  He states he is supposed to see Dr. Shyrl on July 6 and his next  chemo is 6/26.  Last chemo was 6/12  FEN - NPO/NGT/IVFs, K replaced by primary  service VTE - got a heparin  bolus, but PE ruled out, plan to stop incase needs surgery and negative for PE, Dr. Tanda d/w primary service via secure chat ID - Maxipime, Flagyl, Vanc  Hypoxia Neutropenia MDD GERD HTN HLD AKI   ADDENDUM 10:12 - CT with no free air.  Findings more c/w ileus.  Will continue to monitor with NGT and bowel rest.  D/w primary and nursing.  Repeat Lactic acid 1.7  I reviewed nursing notes, Consultant CCM notes, hospitalist notes, last 24 h vitals and pain scores, last 48 h intake and output, last 24 h labs and trends, and last 24 h imaging results.   LOS: 1 day    Burnard FORBES Banter , [facility] Surgery 11/03/2023, 8:32 AM Please see Amion for pager number during day hours 7:00am-4:30pm or 7:00am -11:30am on weekends

## 2023-11-03 NOTE — Progress Notes (Signed)
 Patient unable to void- bladder [place] an 780 md- order placed for I and O- 850 ml retrieved from bladder.

## 2023-11-03 NOTE — Assessment & Plan Note (Signed)
 Usually on chronic opiates Continue these as needed

## 2023-11-04 ENCOUNTER — Inpatient Hospital Stay (HOSPITAL_COMMUNITY)

## 2023-11-04 ENCOUNTER — Other Ambulatory Visit: Payer: Self-pay

## 2023-11-04 DIAGNOSIS — D709 Neutropenia, unspecified: Secondary | ICD-10-CM | POA: Diagnosis not present

## 2023-11-04 DIAGNOSIS — R571 Hypovolemic shock: Secondary | ICD-10-CM | POA: Diagnosis not present

## 2023-11-04 DIAGNOSIS — K567 Ileus, unspecified: Secondary | ICD-10-CM

## 2023-11-04 LAB — CBC
HCT: 28.8 % — ABNORMAL LOW (ref 39.0–52.0)
Hemoglobin: 10.3 g/dL — ABNORMAL LOW (ref 13.0–17.0)
MCH: 33.2 pg (ref 26.0–34.0)
MCHC: 35.8 g/dL (ref 30.0–36.0)
MCV: 92.9 fL (ref 80.0–100.0)
Platelets: 149 10*3/uL — ABNORMAL LOW (ref 150–400)
RBC: 3.1 MIL/uL — ABNORMAL LOW (ref 4.22–5.81)
RDW: 14.6 % (ref 11.5–15.5)
WBC: 4.3 10*3/uL (ref 4.0–10.5)
nRBC: 0 % (ref 0.0–0.2)

## 2023-11-04 LAB — BASIC METABOLIC PANEL WITH GFR
Anion gap: 8 (ref 5–15)
BUN: 31 mg/dL — ABNORMAL HIGH (ref 8–23)
CO2: 20 mmol/L — ABNORMAL LOW (ref 22–32)
Calcium: 7.9 mg/dL — ABNORMAL LOW (ref 8.9–10.3)
Chloride: 104 mmol/L (ref 98–111)
Creatinine, Ser: 1.25 mg/dL — ABNORMAL HIGH (ref 0.61–1.24)
GFR, Estimated: >60 mL/min (ref >60)
Glucose, Bld: 109 mg/dL — ABNORMAL HIGH (ref 70–99)
Potassium: 3.3 mmol/L — ABNORMAL LOW (ref 3.5–5.1)
Sodium: 132 mmol/L — ABNORMAL LOW (ref 135–145)

## 2023-11-04 LAB — HEPARIN LEVEL (UNFRACTIONATED): Heparin Unfractionated: 0.36 [IU]/mL (ref 0.30–0.70)

## 2023-11-04 LAB — PATHOLOGIST SMEAR REVIEW

## 2023-11-04 MED ORDER — POTASSIUM CHLORIDE 10 MEQ/100ML IV SOLN: 10.0000 meq | INTRAVENOUS | Status: DC

## 2023-11-04 MED ORDER — BUPROPION HCL ER (XL) 150 MG PO TB24
150.0000 mg | ORAL_TABLET | Freq: Every morning | ORAL | Status: DC
  Administered 2023-11-04 – 2023-11-11 (×8): 150 mg via ORAL
  Filled 2023-11-04 (×8): qty 1

## 2023-11-04 MED ORDER — PROCHLORPERAZINE MALEATE 10 MG PO TABS
10.0000 mg | ORAL_TABLET | Freq: Four times a day (QID) | ORAL | Status: DC | PRN
  Administered 2023-11-04: 10 mg via ORAL
  Filled 2023-11-04 (×2): qty 1

## 2023-11-04 MED ORDER — AMLODIPINE BESYLATE 10 MG PO TABS
10.0000 mg | ORAL_TABLET | Freq: Every day | ORAL | Status: DC
  Administered 2023-11-04 – 2023-11-11 (×8): 10 mg via ORAL
  Filled 2023-11-04 (×8): qty 1

## 2023-11-04 MED ORDER — ROSUVASTATIN CALCIUM 20 MG PO TABS
20.0000 mg | ORAL_TABLET | Freq: Every day | ORAL | Status: DC
  Administered 2023-11-04 – 2023-11-11 (×8): 20 mg via ORAL
  Filled 2023-11-04 (×8): qty 1

## 2023-11-04 MED ORDER — POTASSIUM CHLORIDE 20 MEQ PO PACK: 20.0000 meq | PACK | ORAL | Status: DC

## 2023-11-04 MED ORDER — METOPROLOL TARTRATE 50 MG PO TABS
50.0000 mg | ORAL_TABLET | Freq: Two times a day (BID) | ORAL | Status: DC
  Administered 2023-11-04 – 2023-11-11 (×15): 50 mg via ORAL
  Filled 2023-11-04 (×3): qty 1
  Filled 2023-11-04: qty 2
  Filled 2023-11-04 (×11): qty 1

## 2023-11-04 MED ORDER — OXYCODONE HCL 5 MG PO TABS
10.0000 mg | ORAL_TABLET | Freq: Four times a day (QID) | ORAL | Status: DC | PRN
  Administered 2023-11-04 – 2023-11-11 (×18): 10 mg via ORAL
  Filled 2023-11-04 (×18): qty 2

## 2023-11-04 MED ORDER — POTASSIUM CHLORIDE 10 MEQ/100ML IV SOLN
10.0000 meq | INTRAVENOUS | Status: AC
  Administered 2023-11-04 (×6): 10 meq via INTRAVENOUS
  Filled 2023-11-04 (×6): qty 100

## 2023-11-04 MED ORDER — LINACLOTIDE 145 MCG PO CAPS
145.0000 ug | ORAL_CAPSULE | Freq: Every day | ORAL | Status: DC
  Administered 2023-11-04 – 2023-11-10 (×7): 145 ug via ORAL
  Filled 2023-11-04 (×8): qty 1

## 2023-11-04 MED ORDER — BUPRENORPHINE HCL 900 MCG BU FILM: 900.0000 ug | ORAL_FILM | Freq: Two times a day (BID) | BUCCAL | Status: DC

## 2023-11-04 NOTE — Progress Notes (Signed)
 Subjective: Much better today.  Still with some abdominal pain, but passing stool.  No NGT output overnight.  ROS: See above, otherwise other systems negative  Objective: Vital signs in last 24 hours: Temp:  [97.2 F (36.2 C)-99 F (37.2 C)] 97.2 F (36.2 C) (06/23 0354) Pulse Rate:  [65-137] 95 (06/23 0430) Resp:  [10-32] 11 (06/23 0430) BP: (70-155)/(57-98) 138/82 (06/23 0430) SpO2:  [95 %-100 %] 95 % (06/23 0430) Weight:  [81 kg] 81 kg (06/23 0500) Last BM Date : 11/03/23  Intake/Output from previous day: 06/22 0701 - 06/23 0700 In: 3191.6 [I.V.:1586.8; IV Piggyback:1604.8] Out: 1500 [Urine:1500] Intake/Output this shift: No intake/output data recorded.  PE: Gen: much better appearing Heart: regular Abd: soft, still with some mild diffuse tenderness, +BS, NGT with no output  Lab Results:  Recent Labs    11/03/23 0206 11/04/23 0349  WBC 1.6* 4.3  HGB 11.3* 10.3*  HCT 32.1* 28.8*  PLT 168 149*   BMET Recent Labs    11/03/23 0206 11/04/23 0349  NA 129* 132*  K 3.4* 3.3*  CL 95* 104  CO2 20* 20*  GLUCOSE 139* 109*  BUN 33* 31*  CREATININE 1.50* 1.25*  CALCIUM  8.4* 7.9*   PT/INR No results for input(s): LABPROT, INR in the last 72 hours. CMP     Component Value Date/Time   NA 132 (L) 11/04/2023 0349   K 3.3 (L) 11/04/2023 0349   CL 104 11/04/2023 0349   CO2 20 (L) 11/04/2023 0349   GLUCOSE 109 (H) 11/04/2023 0349   BUN 31 (H) 11/04/2023 0349   CREATININE 1.25 (H) 11/04/2023 0349   CREATININE 0.85 10/10/2023 0824   CALCIUM  7.9 (L) 11/04/2023 0349   PROT 5.9 (L) 11/02/2023 2014   ALBUMIN 2.5 (L) 11/02/2023 2014   AST 26 11/02/2023 2014   AST 15 10/10/2023 0824   ALT 13 11/02/2023 2014   ALT 11 10/10/2023 0824   ALKPHOS 24 (L) 11/02/2023 2014   BILITOT 1.1 11/02/2023 2014   BILITOT 0.5 10/10/2023 0824   GFRNONAA >60 11/04/2023 0349   GFRNONAA >60 10/10/2023 0824   GFRAA  10/16/2007 1246    >60        The eGFR has been  calculated using the MDRD equation. This calculation has not been validated in all clinical   Lipase  No results found for: LIPASE     Studies/Results: ECHOCARDIOGRAM COMPLETE Result Date: 11/03/2023    ECHOCARDIOGRAM REPORT   Patient Name:   [name] Date of Exam: 11/03/2023 Medical Rec #:  [id number]    Height:       71.0 in Accession #:    [id number]   Weight:       186.1 lb Date of Birth:  1956/02/27    BSA:          2.045 m Patient Age:    68 years     BP:           93/65 mmHg Patient Gender: M            HR:           145 bpm. Exam Location:  Inpatient Procedure: 2D Echo, Cardiac Doppler and Color Doppler (Both Spectral and Color            Flow Doppler were utilized during procedure). Indications:    Elevated brain natriuretic peptide (BNP) level  History:        Patient has no prior history of  Echocardiogram examinations.                 Risk Factors:Hypertension.  Sonographer:    Jayson Gaskins Referring Phys: [phone] LAURA R GLEASON IMPRESSIONS  1. Left ventricular ejection fraction, by estimation, is >75%. The left ventricle has hyperdynamic function. Indeterminate diastolic filling due to E-A fusion.  2. Right ventricular systolic function is normal. The right ventricular size is normal.  3. The mitral valve is grossly normal. No evidence of mitral valve regurgitation.  4. The aortic valve is tricuspid. Aortic valve regurgitation is not visualized. Comparison(s): No prior Echocardiogram. FINDINGS  Left Ventricle: Left ventricular ejection fraction, by estimation, is >75%. The left ventricle has hyperdynamic function. The left ventricular internal cavity size was normal in size. There is no left ventricular hypertrophy. Indeterminate diastolic filling due to E-A fusion. Right Ventricle: The right ventricular size is normal. No increase in right ventricular wall thickness. Right ventricular systolic function is normal. Left Atrium: Left atrial size was normal in size. Right Atrium: Right  atrial size was normal in size. Pericardium: There is no evidence of pericardial effusion. Mitral Valve: The mitral valve is grossly normal. No evidence of mitral valve regurgitation. Tricuspid Valve: The tricuspid valve is not well visualized. Tricuspid valve regurgitation is not demonstrated. Aortic Valve: The aortic valve is tricuspid. Aortic valve regurgitation is not visualized. Aortic valve mean gradient measures 2.0 mmHg. Aortic valve peak gradient measures 3.2 mmHg. Aortic valve area, by VTI measures 3.74 cm. Pulmonic Valve: The pulmonic valve was not well visualized. Pulmonic valve regurgitation is not visualized. Aorta: The aortic root and ascending aorta are structurally normal, with no evidence of dilitation. Venous: The inferior vena cava was not well visualized. IAS/Shunts: The interatrial septum was not assessed.  LEFT VENTRICLE PLAX 2D LVIDd:         4.40 cm LVIDs:         2.40 cm LV PW:         0.90 cm LV IVS:        1.00 cm LVOT diam:     2.00 cm LV SV:         41 LV SV Index:   20 LVOT Area:     3.14 cm  RIGHT VENTRICLE RV S prime:     13.40 cm/s TAPSE (M-mode): 1.7 cm LEFT ATRIUM             Index LA Vol (A2C):   55.8 ml 27.29 ml/m LA Vol (A4C):   33.5 ml 16.38 ml/m LA Biplane Vol: 46.6 ml 22.79 ml/m  AORTIC VALVE AV Area (Vmax):    2.48 cm AV Area (Vmean):   3.08 cm AV Area (VTI):     3.74 cm AV Vmax:           89.90 cm/s AV Vmean:          62.200 cm/s AV VTI:            0.111 m AV Peak Grad:      3.2 mmHg AV Mean Grad:      2.0 mmHg LVOT Vmax:         71.10 cm/s LVOT Vmean:        60.900 cm/s LVOT VTI:          0.132 m LVOT/AV VTI ratio: 1.19  AORTA Ao Root diam: 3.70 cm MITRAL VALVE MV Area (PHT): 4.63 cm    SHUNTS MV Decel Time: 164 msec    Systemic VTI:  0.13 m  MV E velocity: 70.70 cm/s  Systemic Diam: 2.00 cm Vinie Maxcy MD Electronically signed by Vinie Maxcy MD Signature Date/Time: 11/03/2023/3:34:05 PM    Final    DG Abd 1 View Result Date: 11/03/2023 CLINICAL DATA:   Nasogastric tube present. EXAM: ABDOMEN - 1 VIEW COMPARISON:  CT earlier today FINDINGS: Tip of the enteric tube is below the diaphragm in the stomach, the side port is in the distal esophagus. Gaseous gastric distension. Dilated bowel in the right abdomen is partially included in the field of view. IMPRESSION: Tip of the enteric tube below the diaphragm in the stomach, the side port is in the distal esophagus. Recommend advancement of approximately 7.5 cm to place the side-port below the diaphragm. Electronically Signed   By: Andrea Gasman M.D.   On: 11/03/2023 13:41   CT ABDOMEN PELVIS WO CONTRAST Result Date: 11/03/2023 CLINICAL DATA:  Small-bowel obstruction. Suspected free air on same day abdominal radiograph EXAM: CT ABDOMEN AND PELVIS WITHOUT CONTRAST TECHNIQUE: Multidetector CT imaging of the abdomen and pelvis was performed following the standard protocol without IV contrast. RADIATION DOSE REDUCTION: This exam was performed according to the departmental dose-optimization program which includes automated exposure control, adjustment of the mA and/or kV according to patient size and/or use of iterative reconstruction technique. COMPARISON:  Same day abdominal radiograph, CT abdomen and pelvis dated 11/03/2023 FINDINGS: Lower chest: No focal consolidation or pulmonary nodule in the lung bases. No pleural effusion or pneumothorax demonstrated. Partially imaged heart size is normal. Coronary artery calcifications. Hepatobiliary: No focal hepatic lesions. No intra or extrahepatic biliary ductal dilation. Normal gallbladder. Pancreas: No focal lesions or main ductal dilation. Spleen: Normal in size without focal abnormality. Adrenals/Urinary Tract: No adrenal nodules. Right renal simple cysts. Subcentimeter left interpolar hypodensity (3:37), too small to characterize but also likely a cyst. No hydronephrosis. Excreted contrast material within the urinary bladder. No abnormal filling defects. Stomach/Bowel:  Enteric tube tip terminates in the gastric body. Normal appearance of the stomach. Intraluminal enteric contrast material is seen throughout the small bowel to the level of the terminal ileum. There is persistent small bowel dilation in the proximal small bowel with gradual luminal narrowing. The colon is diffusely underdistended. Dilute enteric contrast material remains within the rectosigmoid colon. Colonic diverticulosis without acute diverticulitis. Normal appendix. Vascular/Lymphatic: Aortic atherosclerosis. Unchanged right common iliac artery aneurysm measuring 19 mm. No enlarged abdominal or pelvic lymph nodes. Reproductive: Prostate is unremarkable. Other: No free fluid, fluid collection, or free air. Musculoskeletal: No acute or abnormal lytic or blastic osseous lesions. Multilevel degenerative changes of the partially imaged thoracic and lumbar spine. IMPRESSION: 1. No evidence of free air. 2. Persistent small bowel dilation in the proximal small bowel with gradual luminal narrowing, likely due to ileus. 3. Colonic diverticulosis without acute diverticulitis. 4. Unchanged right common iliac artery aneurysm measuring 19 mm. 5.  Aortic Atherosclerosis (ICD10-I70.0). Electronically Signed   By: Limin  Xu M.D.   On: 11/03/2023 09:47   DG Abd Portable 1V-Small Bowel Obstruction Protocol-initial, 8 hr delay Result Date: 11/03/2023 EXAM: 1 VIEW XRAY OF THE ABDOMEN SUPINE 11/03/2023 06:03:00 AM COMPARISON: CT of the abdomen and pelvis 11/03/2023 at 01:30 AM. CLINICAL HISTORY: Small bowel obstruction 8 hr delay. FINDINGS: BOWEL: Oral contrast has progressed some into dilated proximal small bowel. Majority of contrast remains in the stomach. PERITONEUM AND SOFT TISSUES: Free air is present. BONES: No acute osseous abnormality. IMPRESSION: 1. Small bowel obstruction with some progression of oral contrast into dilated proximal small bowel  The majority of contrast remains in the stomach. 2. Free air. Electronically  signed by: Lonni Necessary MD 11/03/2023 06:22 AM EDT RP Workstation: [id number]   CT Angio Chest Pulmonary Embolism (PE) W or WO Contrast Result Date: 11/03/2023 CLINICAL DATA:  Pulmonary embolism (PE) suspected, high prob; Bowel obstruction suspected. Gastric cancer. * Tracking Code: BO * EXAM: CT ANGIOGRAPHY CHEST CT ABDOMEN AND PELVIS WITH CONTRAST TECHNIQUE: Multidetector CT imaging of the chest was performed using the standard protocol during bolus administration of intravenous contrast. Multiplanar CT image reconstructions and MIPs were obtained to evaluate the vascular anatomy. Multidetector CT imaging of the abdomen and pelvis was performed using the standard protocol during bolus administration of intravenous contrast. RADIATION DOSE REDUCTION: This exam was performed according to the departmental dose-optimization program which includes automated exposure control, adjustment of the mA and/or kV according to patient size and/or use of iterative reconstruction technique. CONTRAST:  60mL OMNIPAQUE  IOHEXOL  350 MG/ML SOLN COMPARISON:  11/02/2023 FINDINGS: CTA CHEST FINDINGS Cardiovascular: There is adequate opacification of the pulmonary arterial tree through the segmental level and there is no intraluminal filling defect identified to suggest acute pulmonary embolism. Central pulmonary arteries are of normal caliber. Extensive multi-vessel coronary artery calcification. Global cardiac size within normal limits. No pericardial effusion. There is fusiform dilation of the thoracic aorta measuring 4.0 cm in diameter in its ascending segment and 3.1 cm in diameter in its proximal descending segment. Moderate superimposed atherosclerotic calcification. Right subclavian chest port tip seen within the superior vena cava. Mediastinum/Nodes: Nasogastric tube tip seen within the proximal body of the stomach. Known gastric cardia mass is not well appreciated on this examination. Esophagus otherwise unremarkable.  Visualized thyroid  is unremarkable. No pathologic thoracic adenopathy. Lungs/Pleura: Mild emphysema. Lungs otherwise clear. No pneumothorax or pleural effusion. Musculoskeletal: Multiple healed right rib fractures and right scapular fracture. No acute bone abnormality. No lytic or blastic bone lesion. Review of the MIP images confirms the above findings. CT ABDOMEN and PELVIS FINDINGS Hepatobiliary: No focal liver abnormality is seen. No gallstones, gallbladder wall thickening, or biliary dilatation. Pancreas: Unremarkable Spleen: Unremarkable Adrenals/Urinary Tract: Adrenal glands are unremarkable. Simple cortical cysts are seen within the right kidney for which no follow-up imaging is recommended. The kidneys are otherwise unremarkable. Bladder unremarkable. Stomach/Bowel: Since the prior examination, the degree of gastric and proximal small bowel dilation has improved, though has not yet completely resolved. Exact point of transition is not clearly identified, however, dilated small bowel extends into the right upper quadrant, anterior and superior to the hepatic flexure of the colon, where there is transition to more normal caliber bowel suggesting a potential internal hernia in this location. The mid and distal small bowel as well as the large bowel are decompressed. No free intraperitoneal gas or fluid. Mild descending colonic diverticulosis again noted. Appendix unremarkable. Vascular/Lymphatic: Extensive aortoiliac atherosclerotic calcification. 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 Stable shotty retroperitoneal adenopathy nonspecific. No frankly pathologic adenopathy within the abdomen and pelvis. Reproductive: Prostate is unremarkable. Other: No abdominal wall hernia or abnormality. No abdominopelvic ascites. Musculoskeletal: No acute bone abnormality. No lytic or blastic bone lesion. Osseous structures are age appropriate. Review of the MIP images confirms the above findings.  IMPRESSION: 1. No acute pulmonary embolism. No acute intrathoracic pathology identified. 2. Extensive multi-vessel coronary artery calcification. 3. Fusiform dilation of the thoracic aorta measuring up to 4.0 cm in its ascending segment. Recommend annual imaging followup by CTA or MRA. This recommendation follows 2010 ACCF/AHA/AATS/ACR/ASA/SCA/SCAI/SIR/STS/SVM Guidelines for the  Diagnosis and Management of Patients with Thoracic Aortic Disease. Circulation. 2010; 121: Z733-z630. Aortic aneurysm NOS (ICD10-I71.9) 4. Interval nasogastric intubation. Interval improvement in gastric and proximal small bowel dilation, though not yet completely resolved. Exact point of transition is not clearly identified, however, dilated small bowel extends into the right upper quadrant, anterior and superior to the hepatic flexure of the colon, where there is transition to more normal caliber bowel suggesting a potential internal hernia in this location. 5. Mild descending colonic diverticulosis. 6. 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 Aortic Atherosclerosis (ICD10-I70.0). Electronically Signed   By: Dorethia Molt M.D.   On: 11/03/2023 02:17   CT ABDOMEN PELVIS W CONTRAST Result Date: 11/03/2023 CLINICAL DATA:  Pulmonary embolism (PE) suspected, high prob; Bowel obstruction suspected. Gastric cancer. * Tracking Code: BO * EXAM: CT ANGIOGRAPHY CHEST CT ABDOMEN AND PELVIS WITH CONTRAST TECHNIQUE: Multidetector CT imaging of the chest was performed using the standard protocol during bolus administration of intravenous contrast. Multiplanar CT image reconstructions and MIPs were obtained to evaluate the vascular anatomy. Multidetector CT imaging of the abdomen and pelvis was performed using the standard protocol during bolus administration of intravenous contrast. RADIATION DOSE REDUCTION: This exam was performed according to the departmental dose-optimization program which includes automated exposure  control, adjustment of the mA and/or kV according to patient size and/or use of iterative reconstruction technique. CONTRAST:  60mL OMNIPAQUE  IOHEXOL  350 MG/ML SOLN COMPARISON:  11/02/2023 FINDINGS: CTA CHEST FINDINGS Cardiovascular: There is adequate opacification of the pulmonary arterial tree through the segmental level and there is no intraluminal filling defect identified to suggest acute pulmonary embolism. Central pulmonary arteries are of normal caliber. Extensive multi-vessel coronary artery calcification. Global cardiac size within normal limits. No pericardial effusion. There is fusiform dilation of the thoracic aorta measuring 4.0 cm in diameter in its ascending segment and 3.1 cm in diameter in its proximal descending segment. Moderate superimposed atherosclerotic calcification. Right subclavian chest port tip seen within the superior vena cava. Mediastinum/Nodes: Nasogastric tube tip seen within the proximal body of the stomach. Known gastric cardia mass is not well appreciated on this examination. Esophagus otherwise unremarkable. Visualized thyroid  is unremarkable. No pathologic thoracic adenopathy. Lungs/Pleura: Mild emphysema. Lungs otherwise clear. No pneumothorax or pleural effusion. Musculoskeletal: Multiple healed right rib fractures and right scapular fracture. No acute bone abnormality. No lytic or blastic bone lesion. Review of the MIP images confirms the above findings. CT ABDOMEN and PELVIS FINDINGS Hepatobiliary: No focal liver abnormality is seen. No gallstones, gallbladder wall thickening, or biliary dilatation. Pancreas: Unremarkable Spleen: Unremarkable Adrenals/Urinary Tract: Adrenal glands are unremarkable. Simple cortical cysts are seen within the right kidney for which no follow-up imaging is recommended. The kidneys are otherwise unremarkable. Bladder unremarkable. Stomach/Bowel: Since the prior examination, the degree of gastric and proximal small bowel dilation has improved,  though has not yet completely resolved. Exact point of transition is not clearly identified, however, dilated small bowel extends into the right upper quadrant, anterior and superior to the hepatic flexure of the colon, where there is transition to more normal caliber bowel suggesting a potential internal hernia in this location. The mid and distal small bowel as well as the large bowel are decompressed. No free intraperitoneal gas or fluid. Mild descending colonic diverticulosis again noted. Appendix unremarkable. Vascular/Lymphatic: Extensive aortoiliac atherosclerotic calcification. 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 Stable shotty retroperitoneal adenopathy nonspecific. No frankly pathologic adenopathy within the abdomen and pelvis. Reproductive: Prostate is unremarkable.  Other: No abdominal wall hernia or abnormality. No abdominopelvic ascites. Musculoskeletal: No acute bone abnormality. No lytic or blastic bone lesion. Osseous structures are age appropriate. Review of the MIP images confirms the above findings. IMPRESSION: 1. No acute pulmonary embolism. No acute intrathoracic pathology identified. 2. Extensive multi-vessel coronary artery calcification. 3. Fusiform dilation of the thoracic aorta measuring up to 4.0 cm in its ascending segment. Recommend annual imaging followup by CTA or MRA. This recommendation follows 2010 ACCF/AHA/AATS/ACR/ASA/SCA/SCAI/SIR/STS/SVM Guidelines for the Diagnosis and Management of Patients with Thoracic Aortic Disease. Circulation. 2010; 121: [id number]. Aortic aneurysm NOS (ICD10-I71.9) 4. Interval nasogastric intubation. Interval improvement in gastric and proximal small bowel dilation, though not yet completely resolved. Exact point of transition is not clearly identified, however, dilated small bowel extends into the right upper quadrant, anterior and superior to the hepatic flexure of the colon, where there is transition to more normal caliber  bowel suggesting a potential internal hernia in this location. 5. Mild descending colonic diverticulosis. 6. 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 Aortic Atherosclerosis (ICD10-I70.0). Electronically Signed   By: Dorethia Molt M.D.   On: 11/03/2023 02:17   DG CHEST PORT 1 VIEW Result Date: 11/02/2023 CLINICAL DATA:  Hypoxia EXAM: PORTABLE CHEST 1 VIEW COMPARISON:  Chest x-ray 11/02/2023 FINDINGS: Enteric tube tip is in the body of the stomach. Right-sided chest port catheter tip projects over the SVC. There is a small right pleural effusion with minimal patchy opacities in the right lung base. The cardiomediastinal silhouette is within normal limits. No pneumothorax. No acute fracture. IMPRESSION: Small right pleural effusion with minimal patchy opacities in the right lung base, likely atelectasis. Electronically Signed   By: Greig Pique M.D.   On: 11/02/2023 23:00   DG Abd Portable 1V-Small Bowel Protocol-Position Verification Result Date: 11/02/2023 CLINICAL DATA:  Tube placement. EXAM: PORTABLE ABDOMEN - 1 VIEW COMPARISON:  6:10 p.m. FINDINGS: Imaged including the upper abdomen and lower chest demonstrates an NG tube. The tip is superimposed with left upper quadrant, below the diaphragm. IMPRESSION: N/OGT in place. Electronically Signed   By: Fonda Field M.D.   On: 11/02/2023 21:46    Anti-infectives: Anti-infectives (From admission, onward)    Start     Dose/Rate Route Frequency Ordered Stop   11/04/23 0200  ceFEPIme (MAXIPIME) 2 g in sodium chloride  0.9 % 100 mL IVPB        2 g 200 mL/hr over 30 Minutes Intravenous Every 12 hours 11/03/23 1451     11/03/23 2200  metroNIDAZOLE (FLAGYL) IVPB 500 mg  Status:  Discontinued        500 mg 100 mL/hr over 60 Minutes Intravenous Every 12 hours 11/02/23 2137 11/03/23 1145   11/03/23 1445  vancomycin (VANCOREADY) IVPB 1750 mg/350 mL  Status:  Discontinued        1,750 mg 175 mL/hr over 120 Minutes Intravenous  Daily 11/03/23 1347 11/04/23 0855   11/03/23 1400  ceFEPIme (MAXIPIME) 2 g in sodium chloride  0.9 % 100 mL IVPB  Status:  Discontinued        2 g 200 mL/hr over 30 Minutes Intravenous Every 8 hours 11/03/23 1356 11/03/23 1451   11/03/23 1245  metroNIDAZOLE (FLAGYL) IVPB 500 mg        500 mg 100 mL/hr over 60 Minutes Intravenous Every 12 hours 11/03/23 1145     11/03/23 0200  ceFEPIme (MAXIPIME) 2 g in sodium chloride  0.9 % 100 mL IVPB  Status:  Discontinued  2 g 200 mL/hr over 30 Minutes Intravenous Every 12 hours 11/02/23 2137 11/03/23 1356   11/02/23 2137  vancomycin variable dose per unstable renal function (pharmacist dosing)  Status:  Discontinued         Does not apply See admin instructions 11/02/23 2137 11/03/23 1347        Assessment/Plan Ileus possibly secondary to gastroenteritis, h/o gastric adenocarcinoma of the cardia -+ BM overnight -no NGT output currently -plain film this morning reviewed personally and interpretation by myself is much improved with contrast in his colon and improved small bowel dilatation -Dc NGT -CLD -suspect this may be ileus after gastroenteritis given history.  Some discomfort in abdomen is not uncommon, but will monitor -he will follow up with Dr. Shyrl for his gastric cancer and he will see Dr. Dasie when chemo completed and Dr. Lanny will arrange this. -d/w primary on the ward   FEN - CLD VTE - heparin  gtt ID - cefepime/flagyl  I reviewed nursing notes, hospitalist notes, last 24 h vitals and pain scores, last 48 h intake and output, last 24 h labs and trends, and last 24 h imaging results.   LOS: 2 days    Burnard FORBES Banter , [facility] Surgery 11/04/2023, 9:05 AM Please see Amion for pager number during day hours 7:00am-4:30pm or 7:00am -11:30am on weekends
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
## 2023-11-04 NOTE — Plan of Care (Signed)
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
## 2023-11-04 NOTE — Progress Notes (Signed)
 [facility] ADULT ICU REPLACEMENT PROTOCOL   The patient does apply for the [facility] Adult ICU Electrolyte Replacment Protocol based on the criteria listed below:   1.Exclusion criteria: TCTS, ECMO, Dialysis, and Myasthenia Gravis patients 2. Is GFR >/= 30 ml/min? Yes.    Patient's GFR today is > 60 3. Is SCr </= 2? Yes.   Patient's SCr is 1.25 mg/dL 4. Did SCr increase >/= 0.5 in 24 hours? No. 5.Pt's weight >40kg  Yes.   6. Abnormal electrolyte(s): K+ 3.3  7. Electrolytes replaced per protocol 8.  Call MD STAT for K+ </= 2.5, Phos </= 1, or Mag </= 1 Physician:  Dr Rober Darner, Norleen Barters 11/04/2023 4:30 AM

## 2023-11-04 NOTE — TOC Initial Note (Signed)
 Transition of Care [facility]) - Initial/Assessment Note    Patient Details  Name: [name] MRN: [id number] Date of Birth: March 19, 1956  Transition of Care [facility]) CM/SW Contact:    Roxie KANDICE Stain, RN Phone Number: 11/04/2023, 1:51 PM  Clinical Narrative:                  Spoke to patient at bedside regarding transition needs.  Patient lives alone and has walker, cane. Patient has 4 brothers and 2 sister. Either [name] Idell ahumada, or Arley, brother, will transport home.  Address, Phone number and PCP verified. TOC will continue to follow for needs.  Expected Discharge Plan: Home w Home Health Services Barriers to Discharge: Continued Medical Work up   Patient Goals and CMS Choice Patient states their goals for this hospitalization and ongoing recovery are:: Return home          Expected Discharge Plan and Services   Discharge Planning Services: CM Consult   Living arrangements for the past 2 months: Single Family Home                                      Prior Living Arrangements/Services Living arrangements for the past 2 months: Single Family Home Lives with:: Self Patient language and need for interpreter reviewed:: Yes Do you feel safe going back to the place where you live?: Yes      Need for Family Participation in Patient Care: Yes (Comment) Care giver support system in place?: Yes (comment) Current home services: DME (walker, cane) Criminal Activity/Legal Involvement Pertinent to Current Situation/Hospitalization: No - Comment as needed  Activities of Daily Living      Permission Sought/Granted                  Emotional Assessment Appearance:: Appears stated age Attitude/Demeanor/Rapport: Engaged Affect (typically observed): Accepting Orientation: : Oriented to Self, Oriented to Place, Oriented to  Time, Oriented to Situation Alcohol / Substance Use: Not Applicable Psych Involvement: No (comment)  Admission diagnosis:  Bowel obstruction  (HCC) [K56.609] Small bowel obstruction (HCC) [K56.609] Sepsis (HCC) [A41.9] Malignant neoplasm of stomach, unspecified location [facility]) [C16.9] Intestinal obstruction, unspecified cause, unspecified whether partial or complete (HCC) [K56.609] Sepsis, due to unspecified organism, unspecified whether acute organ dysfunction present [facility]) [A41.9] Patient Active Problem List   Diagnosis Date Noted   Neutropenia with fever (HCC) 11/03/2023   Hypoxic respiratory failure (HCC) 11/03/2023   Sepsis (HCC) 11/02/2023   Bowel obstruction (HCC) 11/02/2023   Pulmonary emphysema (HCC) 09/24/2023   Chronic obstructive pulmonary disease (HCC) 09/24/2023   Benign essential hypertension 09/24/2023   Abnormal immunological findings in specimens from other organs, systems and tissues 09/24/2023   Diabetic renal disease (HCC) 09/24/2023   Displacement of lumbar intervertebral disc without myelopathy 09/24/2023   Diverticular disease of colon 09/24/2023   Drug induced constipation 09/24/2023   Family history of colon cancer 09/24/2023   Gastro-esophageal reflux disease without esophagitis 09/24/2023   Hardening of the aorta (main artery of the heart) (HCC) 09/24/2023   Atherosclerotic heart disease of native coronary artery without angina pectoris 09/24/2023   Hepatitis C 09/24/2023   Herpes simplex infection 09/24/2023   History of adenomatous polyp of colon 09/24/2023   Hypercholesterolemia 09/24/2023   Hyperglycemia due to type 2 diabetes mellitus (HCC) 09/24/2023   Hypertensive retinopathy 09/24/2023   Insomnia 09/24/2023   Kidney stone 09/24/2023   Major depressive disorder with  single episode, in full remission (HCC) 09/24/2023   Nicotine dependence, cigarettes, uncomplicated 09/24/2023   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 AT [facility] [address] [place] [address] Phone: [phone] Fax: [phone]  DARRYLE LONG - [facility] [address] [place] [address] Phone: [phone] Fax: [phone]     Social Drivers of Health (SDOH) Social History: SDOH Screenings   Food Insecurity: No Food Insecurity (08/20/2023)  Housing: Unknown (08/21/2023)   Received from [facility]  Transportation Needs: No Transportation Needs (08/20/2023)  Utilities: Not At Risk (08/20/2023)  Depression (PHQ2-9): Low Risk  (08/20/2023)  Financial Resource Strain: Low Risk  (08/20/2023)  Stress: No Stress Concern Present (08/20/2023)  Tobacco Use: High Risk (10/10/2023)   SDOH Interventions:     Readmission Risk Interventions     No data to display

## 2023-11-05 ENCOUNTER — Telehealth (HOSPITAL_COMMUNITY): Payer: Self-pay | Admitting: Pharmacy Technician

## 2023-11-05 ENCOUNTER — Other Ambulatory Visit (HOSPITAL_COMMUNITY): Payer: Self-pay

## 2023-11-05 DIAGNOSIS — C169 Malignant neoplasm of stomach, unspecified: Secondary | ICD-10-CM | POA: Diagnosis not present

## 2023-11-05 DIAGNOSIS — I480 Paroxysmal atrial fibrillation: Secondary | ICD-10-CM | POA: Diagnosis not present

## 2023-11-05 DIAGNOSIS — K56609 Unspecified intestinal obstruction, unspecified as to partial versus complete obstruction: Secondary | ICD-10-CM | POA: Diagnosis not present

## 2023-11-05 LAB — CBC
HCT: 31.8 % — ABNORMAL LOW (ref 39.0–52.0)
Hemoglobin: 11 g/dL — ABNORMAL LOW (ref 13.0–17.0)
MCH: 32.5 pg (ref 26.0–34.0)
MCHC: 34.6 g/dL (ref 30.0–36.0)
MCV: 94.1 fL (ref 80.0–100.0)
Platelets: 166 10*3/uL (ref 150–400)
RBC: 3.38 MIL/uL — ABNORMAL LOW (ref 4.22–5.81)
RDW: 14.7 % (ref 11.5–15.5)
WBC: 7.7 10*3/uL (ref 4.0–10.5)
nRBC: 0.3 % — ABNORMAL HIGH (ref 0.0–0.2)

## 2023-11-05 LAB — HEPARIN LEVEL (UNFRACTIONATED): Heparin Unfractionated: 0.28 [IU]/mL — ABNORMAL LOW (ref 0.30–0.70)

## 2023-11-05 MED ORDER — SODIUM CHLORIDE 0.9% FLUSH
10.0000 mL | INTRAVENOUS | Status: DC | PRN
  Administered 2023-11-11: 10 mL

## 2023-11-05 MED ORDER — SODIUM CHLORIDE 0.9% FLUSH
10.0000 mL | Freq: Two times a day (BID) | INTRAVENOUS | Status: DC
  Administered 2023-11-05 – 2023-11-11 (×13): 10 mL

## 2023-11-05 MED ORDER — PANTOPRAZOLE SODIUM 40 MG PO TBEC
40.0000 mg | DELAYED_RELEASE_TABLET | Freq: Every morning | ORAL | Status: DC
  Administered 2023-11-05 – 2023-11-11 (×7): 40 mg via ORAL
  Filled 2023-11-05 (×7): qty 1

## 2023-11-05 MED ORDER — PREGABALIN 25 MG PO CAPS
25.0000 mg | ORAL_CAPSULE | Freq: Every day | ORAL | Status: DC
  Administered 2023-11-05 – 2023-11-10 (×6): 25 mg via ORAL
  Filled 2023-11-05 (×6): qty 1

## 2023-11-05 MED ORDER — APIXABAN 5 MG PO TABS
5.0000 mg | ORAL_TABLET | Freq: Two times a day (BID) | ORAL | Status: DC
  Administered 2023-11-05 – 2023-11-11 (×13): 5 mg via ORAL
  Filled 2023-11-05 (×13): qty 1

## 2023-11-05 NOTE — Plan of Care (Signed)

## 2023-11-05 NOTE — Evaluation (Addendum)
 Physical Therapy Evaluation Patient Details Name: [name] MRN: [id number] DOB: September 08, 1955 Today's Date: 11/05/2023  History of Present Illness  68 yo male admitted 11/02/23  with abdominal pain, vomiting, SBO, AKI. 6/22 Afib with RVR. PMhx: hep C, chronic pain, COPD, HTN, CAD, depression, gastric CA undergoing chemo  Clinical Impression  Patient presents with decreased mobility due to generalized weakness, decreased balance and decreased activity tolerance.  Previously living alone completing all ADL/IADL's. Currently needing CGA to S for mobility with RW and c/o fatigue with hallway ambulation.  Reports one fall at home in past 6 months and still sore on his bottom. Educated in fall prevention techniques for home. States daughter lives close and can assist at d/c.  Feel he will benefit from skilled PT in the acute setting and from HHPT at d/c.         If plan is discharge home, recommend the following: Assistance with cooking/housework;Assist for transportation;Help with stairs or ramp for entrance;Supervision due to cognitive status   Can travel by private vehicle        Equipment Recommendations None recommended by PT  Recommendations for Other Services       Functional Status Assessment Patient has had a recent decline in their functional status and demonstrates the ability to make significant improvements in function in a reasonable and predictable amount of time.     Precautions / Restrictions Precautions Precautions: Fall Precaution/Restrictions Comments: reports one fall in past 6 months      Mobility  Bed Mobility Overal bed mobility: Modified Independent                  Transfers Overall transfer level: Needs assistance Equipment used: Rolling walker (2 wheels) Transfers: Sit to/from Stand Sit to Stand: Supervision, Contact guard assist           General transfer comment: for safety, performed with proper hand placement without cues     Ambulation/Gait Ambulation/Gait assistance: Contact guard assist, Supervision Gait Distance (Feet): 160 Feet Assistive device: Rolling walker (2 wheels) Gait Pattern/deviations: Step-through pattern, Decreased stride length       General Gait Details: walking with wide walker managing with S most of ambulation though CGA for turns and as fatigues  Stairs            Wheelchair Mobility     Tilt Bed    Modified Rankin (Stroke Patients Only)       Balance Overall balance assessment: Needs assistance   Sitting balance-Leahy Scale: Good       Standing balance-Leahy Scale: Fair Standing balance comment: static balance without UE support, walker/UE support for dynamic mobility                             Pertinent Vitals/Pain Pain Assessment Pain Assessment: Faces Faces Pain Scale: Hurts little more Pain Location: bottom from when he fell recently Pain Descriptors / Indicators: Sore Pain Intervention(s): Monitored during session    Home Living Family/patient expects to be discharged to:: Private residence Living Arrangements: Alone Available Help at Discharge: Family;Available PRN/intermittently Type of Home: House Home Access: Stairs to enter   Entrance Stairs-Number of Steps: 1&1   Home Layout: One level Home Equipment: Grab bars - tub/shower;Rolling Walker (2 wheels);Cane - single point      Prior Function Prior Level of Function : Independent/Modified Independent             Mobility Comments: drives, cooks, manages meds,  cleans, not using asstive devices previously       Extremity/Trunk Assessment   Upper Extremity Assessment Upper Extremity Assessment: Defer to OT evaluation    Lower Extremity Assessment Lower Extremity Assessment: Overall WFL for tasks assessed       Communication   Communication Communication: No apparent difficulties    Cognition Arousal: Alert Behavior During Therapy: WFL for tasks  assessed/performed   PT - Cognitive impairments: No apparent impairments                         Following commands: Intact       Cueing Cueing Techniques: Verbal cues     General Comments General comments (skin integrity, edema, etc.): HR 80 after ambulation, denied symptoms of dizziness or SOB, just reported fatigue; states up till 3am with frequent watery stools    Exercises     Assessment/Plan    PT Assessment Patient needs continued PT services  PT Problem List Decreased strength;Decreased activity tolerance;Decreased balance;Decreased mobility;Decreased knowledge of use of DME       PT Treatment Interventions DME instruction;Gait training;Functional mobility training;Patient/family education;Therapeutic activities;Balance training;Therapeutic exercise    PT Goals (Current goals can be found in the Care Plan section)  Acute Rehab PT Goals Patient Stated Goal: to get stronger, return to independent PT Goal Formulation: With patient Time For Goal Achievement: 11/05/23 Potential to Achieve Goals: Good    Frequency Min 2X/week     Co-evaluation               AM-PAC PT 6 Clicks Mobility  Outcome Measure Help needed turning from your back to your side while in a flat bed without using bedrails?: None Help needed moving from lying on your back to sitting on the side of a flat bed without using bedrails?: None Help needed moving to and from a bed to a chair (including a wheelchair)?: A Little Help needed standing up from a chair using your arms (e.g., wheelchair or bedside chair)?: A Little Help needed to walk in hospital room?: A Little Help needed climbing 3-5 steps with a railing? : A Little 6 Click Score: 20    End of Session Equipment Utilized During Treatment: Gait belt Activity Tolerance: Patient tolerated treatment well Patient left: in bed;with call bell/phone within reach;with bed alarm set   PT Visit Diagnosis: Other abnormalities of  gait and mobility (R26.89);Muscle weakness (generalized) (M62.81)    Time: 9074-9055 PT Time Calculation (min) (ACUTE ONLY): 19 min   Charges:   PT Evaluation $PT Eval Low Complexity: 1 Low   PT General Charges $$ ACUTE PT VISIT: 1 Visit         Micheline Velasquez, PT Acute Rehabilitation Services Office:[phone] 11/05/2023   [name] 11/05/2023, 10:03 AM
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
## 2023-11-05 NOTE — Telephone Encounter (Signed)
 Patient Product/process development scientist completed.    The patient is insured through [facility]. Patient has Medicare and is not eligible for a copay card, but may be able to apply for patient assistance or Medicare RX Payment Plan (Patient Must reach out to their plan, if eligible for payment plan), if available.    Ran test claim for Eliquis 5 mg and the current 30 day co-pay is $0.00.  Ran test claim for Xarelto 20 mg and the current 30 day co-pay is $0.00.  This test claim was processed through [facility]- copay amounts may vary at other pharmacies due to pharmacy/plan contracts, or as the patient moves through the different stages of their insurance plan.     Reyes Sharps, CPHT Pharmacy Technician III Certified Patient Advocate [facility] Patient Advocate Team Direct Number: [phone]  Fax: [phone]

## 2023-11-05 NOTE — Progress Notes (Addendum)
 Transition of Care [facility]) - Inpatient Brief Assessment   Patient Details  Name: [name] MRN: [id number] Date of Birth: Mar 17, 1956  [facility]) CM/SW Contact:    Jon JONELLE Joe, RN Phone Number: 11/05/2023, 12:24 PM   Clinical Narrative: CM attempted to meet with the patient at the bedside but was unable to awaken the patient at this time to discuss home health needs.  Bedside nursing was alerted to please call me and I would follow up with the patient to set up.  11/05/23 1403 - CM met with the patient at the bedside to discuss TOC needs.  The patient lives at home and has daughter available in the area to assist as needed.  DME at the home includes [place], [employer], and crutches.  Patient was offered Medicare choice regarding home health services and patient did not have a preference.  I called [facility] and [name], RNCM accepted for [facility] services.  HH for PT/OT ordered to be co-signed by the MD.  Patient plans to have brother provide transportation to home when stable for discharge.   Transition of Care Asessment: Insurance and Status: (P) Insurance coverage has been reviewed Patient has primary care physician: (P) Yes Home environment has been reviewed: (P) from home   Prior/Current Home Services: (P) No current home services Social Drivers of Health Review: (P) SDOH reviewed needs interventions Readmission risk has been reviewed: (P) Yes Transition of care needs: (P) transition of care needs identified, TOC will continue to follow

## 2023-11-05 NOTE — Progress Notes (Signed)
 [facility] PROGRESS NOTE   [name] [name] DOB: Oct 01, 1955 DOA: 11/02/2023  PCP: Sun, Vyvyan, MD  Brief History:  68 y.o. M with PMH significant for gastric adenocarcimona on neoadjuvant chemotherapy who presented to [facility] ED with approximately 2 days of diffuse abdominal pain, nausea and vomiting.  His work-up there revealed on SBO with transition point, WBC 1.8, creatinine elevation to 2 up from baseline <1, lactic acid 5.1, new oxygen requirement on non-rebreather with tachycardia and hypotension, though vital signs improved with IVF on arrival to [place].   On arrival his MAP was in the 70's without pressors.  CT chest without contrast at [facility] did not reveal an acute cause for his hypoxia.  Denies chest pain, dyspnea, or hematemesis, states his last BM was earlier today and was dark.  Pt was seen by the surgery team and plan for admission and medical management   Consultants: General Surgery.  Critical care medicine.  Procedures: None    Subjective/Interval History: Patient mentioned that he feels much better.  Denies any abdominal pain.  No shortness of breath or chest pain.  No dizziness or lightheadedness.    Assessment/Plan:  Paroxysmal atrial fibrillation Developed atrial fibrillation in the setting of hypotension.  Looks like he briefly required amiodarone.  Was also started on heparin  infusion. Echocardiogram showed normal LVEF.  No significant valvular disease noted.  Will order TSH. Patient denies any coronary artery disease.  Denies any vascular disease.  Does not take any blood thinners or antiplatelets at home.  Does admit to history of diabetes.  CHADS2 vascular score is 3.  Anticoagulation was discussed with the patient.  He is agreeable to it.  Will switch heparin  to Eliquis.  Bleeding risks explained.. Continue metoprolol.  Hypovolemic shock Likely due to gastroenteritis.  Required pressors in the ICU.  Was aggressively hydrated.  Has  improved.  Sepsis has been ruled out.  Ileus in the setting of viral gastroenteritis in the setting of gastric adenocarcinoma Seen by general surgery.  Had NG tube which was taken out yesterday.  Tolerating his liquids.  Gastric carcinoma Undergoing adjuvant chemotherapy.  Plan is for surgery in a few months.  Acute respiratory failure with hypoxia/history of COPD CTA was negative for PE.  Currently off of oxygen.  Respiratory status is stable.  History of depression Continue home medications.  Acute kidney injury Creatinine peaked at 2.14.  Improved to 1.25 as of yesterday.  Will recheck labs tomorrow.  Normocytic anemia Stable.  Hypokalemia and hyponatremia Potassium was supplemented yesterday.  Recheck labs in the morning.  DVT Prophylaxis: Currently on IV heparin .  Will be changed over to Eliquis. Code Status: Full code. Family Communication: Discussed with patient.  No family at bedside Disposition Plan: Lives by himself.  PT and OT eval     Medications: Scheduled:  amLODipine  10 mg Oral Daily   buPROPion  150 mg Oral q morning   Chlorhexidine  Gluconate Cloth  6 each Topical Daily   linaclotide  145 mcg Oral QHS   metoprolol tartrate  50 mg Oral BID   pantoprazole   40 mg Oral q morning   pregabalin  25 mg Oral QHS   rosuvastatin  20 mg Oral Daily   sodium chloride  flush  10-40 mL Intracatheter Q12H   Continuous:  heparin  1,500 Units/hr (11/05/23 0850)   PRN:HYDROmorphone (DILAUDID) injection, mouth rinse, oxyCODONE , prochlorperazine , sodium chloride  flush  Antibiotics: Anti-infectives (From admission, onward)    Start     Dose/Rate Route  Frequency Ordered Stop   11/04/23 0200  ceFEPIme (MAXIPIME) 2 g in sodium chloride  0.9 % 100 mL IVPB  Status:  Discontinued        2 g 200 mL/hr over 30 Minutes Intravenous Every 12 hours 11/03/23 1451 11/04/23 0953   11/03/23 2200  metroNIDAZOLE (FLAGYL) IVPB 500 mg  Status:  Discontinued        500 mg 100 mL/hr over 60  Minutes Intravenous Every 12 hours 11/02/23 2137 11/03/23 1145   11/03/23 1445  vancomycin (VANCOREADY) IVPB 1750 mg/350 mL  Status:  Discontinued        1,750 mg 175 mL/hr over 120 Minutes Intravenous Daily 11/03/23 1347 11/04/23 0855   11/03/23 1400  ceFEPIme (MAXIPIME) 2 g in sodium chloride  0.9 % 100 mL IVPB  Status:  Discontinued        2 g 200 mL/hr over 30 Minutes Intravenous Every 8 hours 11/03/23 1356 11/03/23 1451   11/03/23 1245  metroNIDAZOLE (FLAGYL) IVPB 500 mg  Status:  Discontinued        500 mg 100 mL/hr over 60 Minutes Intravenous Every 12 hours 11/03/23 1145 11/04/23 0953   11/03/23 0200  ceFEPIme (MAXIPIME) 2 g in sodium chloride  0.9 % 100 mL IVPB  Status:  Discontinued        2 g 200 mL/hr over 30 Minutes Intravenous Every 12 hours 11/02/23 2137 11/03/23 1356   11/02/23 2137  vancomycin variable dose per unstable renal function (pharmacist dosing)  Status:  Discontinued         Does not apply See admin instructions 11/02/23 2137 11/03/23 1347       Objective:  Vital Signs  Vitals:   11/04/23 2114 11/04/23 2115 11/05/23 0441 11/05/23 0801  BP:  120/76 131/85 138/84  Pulse:  77 72 74  Resp:   17 18  Temp: 98 F (36.7 C) 98 F (36.7 C) 97.8 F (36.6 C) 97.7 F (36.5 C)  TempSrc: Oral Oral    SpO2:  96% 99% 98%  Weight:      Height:        Intake/Output Summary (Last 24 hours) at 11/05/2023 0940 Last data filed at 11/05/2023 0400 Gross per 24 hour  Intake 718.3 ml  Output --  Net 718.3 ml   Filed Weights   11/02/23 2039 11/04/23 0500  Weight: 84.4 kg 81 kg    General appearance: Awake alert.  In no distress Resp: Clear to auscultation bilaterally.  Normal effort Cardio: S1-S2 is normal regular.  No S3-S4.  No rubs murmurs or bruit GI: Abdomen is soft.  Nontender nondistended.  Bowel sounds are present normal.  No masses organomegaly Extremities: No edema.  Full range of motion of lower extremities. Neurologic: Alert and oriented x3.  No focal  neurological deficits.    Lab Results:  Data Reviewed: I have personally reviewed following labs and reports of the imaging studies  CBC: Recent Labs  Lab 11/02/23 2014 11/03/23 0206 11/04/23 0349 11/05/23 0421  WBC 1.7* 1.6* 4.3 7.7  NEUTROABS 0.1*  --   --   --   HGB 11.7* 11.3* 10.3* 11.0*  HCT 32.6* 32.1* 28.8* 31.8*  MCV 92.9 93.0 92.9 94.1  PLT 178 168 149* 166    Basic Metabolic Panel: Recent Labs  Lab 11/02/23 2014 11/03/23 0206 11/04/23 0349  NA 129* 129* 132*  K 3.6 3.4* 3.3*  CL 95* 95* 104  CO2 19* 20* 20*  GLUCOSE 164* 139* 109*  BUN 35* 33* 31*  CREATININE 2.14* 1.50* 1.25*  CALCIUM  8.7* 8.4* 7.9*  MG  --  1.8  --   PHOS  --  2.9  --     GFR: Estimated Creatinine Clearance: 61.1 mL/min (A) (by C-G formula based on SCr of 1.25 mg/dL (H)).  Liver Function Tests: Recent Labs  Lab 11/02/23 2014  AST 26  ALT 13  ALKPHOS 24*  BILITOT 1.1  PROT 5.9*  ALBUMIN 2.5*    CBG: Recent Labs  Lab 11/03/23 1125 11/03/23 2330  GLUCAP 125* 116*     Recent Results (from the past 240 hours)  MRSA Next Gen by PCR, Nasal     Status: None   Collection Time: 11/02/23 10:01 PM   Specimen: Nasal Mucosa; Nasal Swab  Result Value Ref Range Status   MRSA by PCR Next Gen NOT DETECTED NOT DETECTED Final    Comment: (NOTE) The GeneXpert MRSA Assay (FDA approved for NASAL specimens only), is one component of a comprehensive MRSA colonization surveillance program. It is not intended to diagnose MRSA infection nor to guide or monitor treatment for MRSA infections. Test performance is not FDA approved in patients less than 37 years old. Performed at [facility], [address], [place], [place] [address]       Radiology Studies: DG Abd Portable 1V Result Date: 11/04/2023 CLINICAL DATA:  Mall bowel obstruction. EXAM: PORTABLE ABDOMEN - 1 VIEW COMPARISON:  11/03/2023 and CT abdomen pelvis 11/03/2023. FINDINGS: Nasogastric tube appears to terminate in  the distal esophagus. Gaseous distention of small bowel and colon. Large amount of gas in the rectum. There is retained oral contrast within the bowel. IMPRESSION: 1. Persistent ileus. 2. Nasogastric tube appears to terminate in the distal esophagus. Consider chest radiograph to better assess placement. Electronically Signed   By: Newell Eke M.D.   On: 11/04/2023 12:48   ECHOCARDIOGRAM COMPLETE Result Date: 11/03/2023    ECHOCARDIOGRAM REPORT   Patient Name:   KOJO LIBY Date of Exam: 11/03/2023 Medical Rec #:  [id number]    Height:       71.0 in Accession #:    [id number]   Weight:       186.1 lb Date of Birth:  1955-10-14    BSA:          2.045 m Patient Age:    68 years     BP:           93/65 mmHg Patient Gender: M            HR:           145 bpm. Exam Location:  Inpatient Procedure: 2D Echo, Cardiac Doppler and Color Doppler (Both Spectral and Color            Flow Doppler were utilized during procedure). Indications:    Elevated brain natriuretic peptide (BNP) level  History:        Patient has no prior history of Echocardiogram examinations.                 Risk Factors:Hypertension.  Sonographer:    Jayson Gaskins Referring Phys: [id number] LAURA R GLEASON IMPRESSIONS  1. Left ventricular ejection fraction, by estimation, is >75%. The left ventricle has hyperdynamic function. Indeterminate diastolic filling due to E-A fusion.  2. Right ventricular systolic function is normal. The right ventricular size is normal.  3. The mitral valve is grossly normal. No evidence of mitral valve regurgitation.  4. The aortic valve is tricuspid. Aortic valve regurgitation  is not visualized. Comparison(s): No prior Echocardiogram. FINDINGS  Left Ventricle: Left ventricular ejection fraction, by estimation, is >75%. The left ventricle has hyperdynamic function. The left ventricular internal cavity size was normal in size. There is no left ventricular hypertrophy. Indeterminate diastolic filling due to E-A fusion. Right  Ventricle: The right ventricular size is normal. No increase in right ventricular wall thickness. Right ventricular systolic function is normal. Left Atrium: Left atrial size was normal in size. Right Atrium: Right atrial size was normal in size. Pericardium: There is no evidence of pericardial effusion. Mitral Valve: The mitral valve is grossly normal. No evidence of mitral valve regurgitation. Tricuspid Valve: The tricuspid valve is not well visualized. Tricuspid valve regurgitation is not demonstrated. Aortic Valve: The aortic valve is tricuspid. Aortic valve regurgitation is not visualized. Aortic valve mean gradient measures 2.0 mmHg. Aortic valve peak gradient measures 3.2 mmHg. Aortic valve area, by VTI measures 3.74 cm. Pulmonic Valve: The pulmonic valve was not well visualized. Pulmonic valve regurgitation is not visualized. Aorta: The aortic root and ascending aorta are structurally normal, with no evidence of dilitation. Venous: The inferior vena cava was not well visualized. IAS/Shunts: The interatrial septum was not assessed.  LEFT VENTRICLE PLAX 2D LVIDd:         4.40 cm LVIDs:         2.40 cm LV PW:         0.90 cm LV IVS:        1.00 cm LVOT diam:     2.00 cm LV SV:         41 LV SV Index:   20 LVOT Area:     3.14 cm  RIGHT VENTRICLE RV S prime:     13.40 cm/s TAPSE (M-mode): 1.7 cm LEFT ATRIUM             Index LA Vol (A2C):   55.8 ml 27.29 ml/m LA Vol (A4C):   33.5 ml 16.38 ml/m LA Biplane Vol: 46.6 ml 22.79 ml/m  AORTIC VALVE AV Area (Vmax):    2.48 cm AV Area (Vmean):   3.08 cm AV Area (VTI):     3.74 cm AV Vmax:           89.90 cm/s AV Vmean:          62.200 cm/s AV VTI:            0.111 m AV Peak Grad:      3.2 mmHg AV Mean Grad:      2.0 mmHg LVOT Vmax:         71.10 cm/s LVOT Vmean:        60.900 cm/s LVOT VTI:          0.132 m LVOT/AV VTI ratio: 1.19  AORTA Ao Root diam: 3.70 cm MITRAL VALVE MV Area (PHT): 4.63 cm    SHUNTS MV Decel Time: 164 msec    Systemic VTI:  0.13 m MV E  velocity: 70.70 cm/s  Systemic Diam: 2.00 cm Vinie Maxcy MD Electronically signed by Vinie Maxcy MD Signature Date/Time: 11/03/2023/3:34:05 PM    Final    DG Abd 1 View Result Date: 11/03/2023 CLINICAL DATA:  Nasogastric tube present. EXAM: ABDOMEN - 1 VIEW COMPARISON:  CT earlier today FINDINGS: Tip of the enteric tube is below the diaphragm in the stomach, the side port is in the distal esophagus. Gaseous gastric distension. Dilated bowel in the right abdomen is partially included in the field of view. IMPRESSION: Tip of  the enteric tube below the diaphragm in the stomach, the side port is in the distal esophagus. Recommend advancement of approximately 7.5 cm to place the side-port below the diaphragm. Electronically Signed   By: Andrea Gasman M.D.   On: 11/03/2023 13:41       LOS: 3 days   Daquana Paddock Verdene  [facility] Pager on [email or web address]  11/05/2023, 9:40 AM

## 2023-11-06 DIAGNOSIS — A419 Sepsis, unspecified organism: Secondary | ICD-10-CM | POA: Diagnosis not present

## 2023-11-06 DIAGNOSIS — R6521 Severe sepsis with septic shock: Secondary | ICD-10-CM | POA: Diagnosis not present

## 2023-11-06 LAB — CBC
HCT: 33.5 % — ABNORMAL LOW (ref 39.0–52.0)
Hemoglobin: 11.8 g/dL — ABNORMAL LOW (ref 13.0–17.0)
MCH: 32.4 pg (ref 26.0–34.0)
MCHC: 35.2 g/dL (ref 30.0–36.0)
MCV: 92 fL (ref 80.0–100.0)
Platelets: 210 10*3/uL (ref 150–400)
RBC: 3.64 MIL/uL — ABNORMAL LOW (ref 4.22–5.81)
RDW: 14.6 % (ref 11.5–15.5)
WBC: 13.1 10*3/uL — ABNORMAL HIGH (ref 4.0–10.5)
nRBC: 0 % (ref 0.0–0.2)

## 2023-11-06 LAB — BASIC METABOLIC PANEL WITH GFR
Anion gap: 9 (ref 5–15)
BUN: 15 mg/dL (ref 8–23)
CO2: 17 mmol/L — ABNORMAL LOW (ref 22–32)
Calcium: 8.7 mg/dL — ABNORMAL LOW (ref 8.9–10.3)
Chloride: 105 mmol/L (ref 98–111)
Creatinine, Ser: 1.13 mg/dL (ref 0.61–1.24)
GFR, Estimated: >60 mL/min (ref >60)
Glucose, Bld: 94 mg/dL (ref 70–99)
Potassium: 2.7 mmol/L — CL (ref 3.5–5.1)
Sodium: 131 mmol/L — ABNORMAL LOW (ref 135–145)

## 2023-11-06 LAB — MAGNESIUM: Magnesium: 1.8 mg/dL (ref 1.7–2.4)

## 2023-11-06 LAB — TSH: TSH: 1.145 u[IU]/mL (ref 0.350–4.500)

## 2023-11-06 MED ORDER — SODIUM CHLORIDE 0.9 % IV SOLN
INTRAVENOUS | Status: AC
  Filled 2023-11-06 (×2): qty 1000

## 2023-11-06 MED ORDER — SODIUM CHLORIDE 0.9 % IV SOLN: INTRAVENOUS | Status: DC

## 2023-11-06 MED ORDER — POTASSIUM CHLORIDE CRYS ER 20 MEQ PO TBCR
40.0000 meq | EXTENDED_RELEASE_TABLET | ORAL | Status: AC
  Administered 2023-11-06 (×3): 40 meq via ORAL
  Filled 2023-11-06 (×3): qty 2

## 2023-11-06 NOTE — Progress Notes (Signed)
 PROGRESS NOTE    [name]  [name] DOB: 1956/05/11 DOA: 11/02/2023 PCP: Sun, Vyvyan, MD   Brief Narrative:  68 y.o. M with PMH significant for gastric adenocarcimona on neoadjuvant chemotherapy who presented to [facility] ED with approximately 2 days of diffuse abdominal pain, nausea and vomiting. His work-up there revealed on SBO with transition point, WBC 1.8, creatinine elevation to 2 up from baseline <1, lactic acid 5.1, new oxygen requirement on non-rebreather with tachycardia and hypotension, though vital signs improved with IVF on arrival to [place]. On arrival his MAP was in the 70's without pressors. CT chest without contrast at [facility] did not reveal an acute cause for his hypoxia. Denies chest pain, dyspnea, or hematemesis, states his last BM was earlier today and was dark. Pt was seen by the surgery team and plan for admission and medical management   Assessment & Plan:   Principal Problem:   Sepsis (HCC) Active Problems:   Gastric cancer (HCC)   Chronic obstructive pulmonary disease (HCC)   Benign essential hypertension   Diabetic renal disease (HCC)   Chronic pain syndrome   Gastro-esophageal reflux disease without esophagitis   Hypercholesterolemia   Major depressive disorder with single episode, in full remission (HCC)   Nicotine dependence, cigarettes, uncomplicated   Bowel obstruction (HCC)   Neutropenia with fever (HCC)   Hypoxic respiratory failure (HCC)  Hypovolemic shock Likely due to gastroenteritis.  Required pressors in the ICU.  Was aggressively hydrated.  Has improved.  Sepsis has been ruled out.   Ileus in the setting of viral gastroenteritis in the setting of gastric adenocarcinoma Seen by general surgery.  Had NG tube which was taken out today before yesterday.  Tolerating his full liquids.  Has been having some diarrhea since admission he says.  He endorses some abdominal pain and nausea as well as vomiting however after clarification, his  last vomiting was yesterday, abdominal pain was yesterday, nausea was an hour ago.  Currently he is tolerating full liquid diet.  I will advance him to cardiac diet.   Gastric carcinoma Undergoing adjuvant chemotherapy.  Plan is for surgery in a few months.  Paroxysmal atrial fibrillation Developed atrial fibrillation in the setting of hypotension.  Looks like he briefly required amiodarone.  Was also started on heparin  infusion. Echocardiogram showed normal LVEF.  No significant valvular disease noted.  Will order TSH. Patient denies any coronary artery disease.  Denies any vascular disease.  Does not take any blood thinners or antiplatelets at home.  Does admit to history of diabetes.  CHADS2 vascular score is 3.  Anticoagulation was discussed with the patient.  He is agreeable to it.  Switched from heparin  to Eliquis.  Metoprolol started.  Rates controlled.   Acute respiratory failure with hypoxia/history of COPD CTA was negative for PE.  Currently off of oxygen.  Respiratory status is stable.   History of depression Continue home medications.   Acute kidney injury Creatinine peaked at 2.14.  Improved and resolved with IV fluids.   Normocytic anemia Stable.   Hypokalemia and hyponatremia Potassium low 2.7 and sodium is dropping to 131.  Patient asymptomatic.  Likely hypovolemia.  Replenish potassium.  Watch sodium for now.  Start him on gentle hydration for next 20 hours.  Dehydration?:  Encouraged p.o. intake.  IV hydration as above.  DVT prophylaxis: SCDs Start: 11/02/23 2157   Code Status: Full Code  Family Communication:  None present at bedside.  Plan of care discussed with patient in length  and he/she verbalized understanding and agreed with it.  Status is: Inpatient Remains inpatient appropriate because: Appears dehydrated, needs some IV fluids and advancement of the night.  Potential discharge tomorrow.   Estimated body mass index is 23.18 kg/m as calculated from the  following:   Height as of this encounter: 5' 11 (1.803 m).   Weight as of this encounter: 75.4 kg.    Nutritional Assessment: Body mass index is 23.18 kg/m.[name] Seen by dietician.  I agree with the assessment and plan as outlined below: Nutrition Status:        . Skin Assessment: I have examined the patient's skin and I agree with the wound assessment as performed by the wound care RN as outlined below:    Consultants:  General surgery  Procedures:  None  Antimicrobials:  Anti-infectives (From admission, onward)    Start     Dose/Rate Route Frequency Ordered Stop   11/04/23 0200  ceFEPIme (MAXIPIME) 2 g in sodium chloride  0.9 % 100 mL IVPB  Status:  Discontinued        2 g 200 mL/hr over 30 Minutes Intravenous Every 12 hours 11/03/23 1451 11/04/23 0953   11/03/23 2200  metroNIDAZOLE (FLAGYL) IVPB 500 mg  Status:  Discontinued        500 mg 100 mL/hr over 60 Minutes Intravenous Every 12 hours 11/02/23 2137 11/03/23 1145   11/03/23 1445  vancomycin (VANCOREADY) IVPB 1750 mg/350 mL  Status:  Discontinued        1,750 mg 175 mL/hr over 120 Minutes Intravenous Daily 11/03/23 1347 11/04/23 0855   11/03/23 1400  ceFEPIme (MAXIPIME) 2 g in sodium chloride  0.9 % 100 mL IVPB  Status:  Discontinued        2 g 200 mL/hr over 30 Minutes Intravenous Every 8 hours 11/03/23 1356 11/03/23 1451   11/03/23 1245  metroNIDAZOLE (FLAGYL) IVPB 500 mg  Status:  Discontinued        500 mg 100 mL/hr over 60 Minutes Intravenous Every 12 hours 11/03/23 1145 11/04/23 0953   11/03/23 0200  ceFEPIme (MAXIPIME) 2 g in sodium chloride  0.9 % 100 mL IVPB  Status:  Discontinued        2 g 200 mL/hr over 30 Minutes Intravenous Every 12 hours 11/02/23 2137 11/03/23 1356   11/02/23 2137  vancomycin variable dose per unstable renal function (pharmacist dosing)  Status:  Discontinued         Does not apply See admin instructions 11/02/23 2137 11/03/23 1347         Subjective: Seen and examined.   Complains of nausea, vomiting abdominal pain but upon further clarification, he had none of those symptoms active at this moment.  Only diarrhea.  Objective: Vitals:   11/05/23 2045 11/06/23 0500 11/06/23 0520 11/06/23 0825  BP: (!) 144/83  (!) 138/90 (!) 151/90  Pulse: 81  76 81  Resp: 18  18 19   Temp: 98.2 F (36.8 C)  97.6 F (36.4 C) (!) 97.4 F (36.3 C)  TempSrc:      SpO2: 100%  99% 97%  Weight:  75.4 kg    Height:       No intake or output data in the 24 hours ending 11/06/23 0902 Filed Weights   11/04/23 0500 11/05/23 0714 11/06/23 0500  Weight: 81 kg 81 kg 75.4 kg    Examination:  General exam: Appears calm and comfortable  Respiratory system: Clear to auscultation. Respiratory effort normal. Cardiovascular system: S1 & S2 heard, RRR.  No JVD, murmurs, rubs, gallops or clicks. No pedal edema. Gastrointestinal system: Abdomen is nondistended, soft and nontender. No organomegaly or masses felt. Normal bowel sounds heard. Central nervous system: Alert and oriented. No focal neurological deficits. Extremities: Symmetric 5 x 5 power. Skin: No rashes, lesions or ulcers Psychiatry: Judgement and insight appear normal. Mood & affect appropriate.    Data Reviewed: I have personally reviewed following labs and imaging studies  CBC: Recent Labs  Lab 11/02/23 2014 11/03/23 0206 11/04/23 0349 11/05/23 0421 11/06/23 0434  WBC 1.7* 1.6* 4.3 7.7 13.1*  NEUTROABS 0.1*  --   --   --   --   HGB 11.7* 11.3* 10.3* 11.0* 11.8*  HCT 32.6* 32.1* 28.8* 31.8* 33.5*  MCV 92.9 93.0 92.9 94.1 92.0  PLT 178 168 149* 166 210   Basic Metabolic Panel: Recent Labs  Lab 11/02/23 2014 11/03/23 0206 11/04/23 0349 11/06/23 0434  NA 129* 129* 132* 131*  K 3.6 3.4* 3.3* 2.7*  CL 95* 95* 104 105  CO2 19* 20* 20* 17*  GLUCOSE 164* 139* 109* 94  BUN 35* 33* 31* 15  CREATININE 2.14* 1.50* 1.25* 1.13  CALCIUM  8.7* 8.4* 7.9* 8.7*  MG  --  1.8  --  1.8  PHOS  --  2.9  --   --     GFR: Estimated Creatinine Clearance: 67.6 mL/min (by C-G formula based on SCr of 1.13 mg/dL). Liver Function Tests: Recent Labs  Lab 11/02/23 2014  AST 26  ALT 13  ALKPHOS 24*  BILITOT 1.1  PROT 5.9*  ALBUMIN 2.5*   No results for input(s): LIPASE, AMYLASE in the last 168 hours. No results for input(s): AMMONIA in the last 168 hours. Coagulation Profile: No results for input(s): INR, PROTIME in the last 168 hours. Cardiac Enzymes: No results for input(s): CKTOTAL, CKMB, CKMBINDEX, TROPONINI in the last 168 hours. BNP (last 3 results) No results for input(s): PROBNP in the last 8760 hours. HbA1C: No results for input(s): HGBA1C in the last 72 hours. CBG: Recent Labs  Lab 11/03/23 1125 11/03/23 2330  GLUCAP 125* 116*   Lipid Profile: No results for input(s): CHOL, HDL, LDLCALC, TRIG, CHOLHDL, LDLDIRECT in the last 72 hours. Thyroid  Function Tests: Recent Labs    11/06/23 0434  TSH 1.145   Anemia Panel: No results for input(s): VITAMINB12, FOLATE, FERRITIN, TIBC, IRON, RETICCTPCT in the last 72 hours. Sepsis Labs: Recent Labs  Lab 11/02/23 2014 11/03/23 0027 11/03/23 0901  LATICACIDVEN 3.5* 1.9 1.7    Recent Results (from the past 240 hours)  MRSA Next Gen by PCR, Nasal     Status: None   Collection Time: 11/02/23 10:01 PM   Specimen: Nasal Mucosa; Nasal Swab  Result Value Ref Range Status   MRSA by PCR Next Gen NOT DETECTED NOT DETECTED Final    Comment: (NOTE) The GeneXpert MRSA Assay (FDA approved for NASAL specimens only), is one component of a comprehensive MRSA colonization surveillance program. It is not intended to diagnose MRSA infection nor to guide or monitor treatment for MRSA infections. Test performance is not FDA approved in patients less than 60 years old. Performed at [facility], [address] [place], [place] [address]      Radiology Studies: No results found.  Scheduled  Meds:  amLODipine  10 mg Oral Daily   apixaban  5 mg Oral BID   buPROPion  150 mg Oral q morning   Chlorhexidine  Gluconate Cloth  6 each Topical Daily  linaclotide  145 mcg Oral QHS   metoprolol tartrate  50 mg Oral BID   pantoprazole   40 mg Oral q morning   potassium chloride   40 mEq Oral Q4H   pregabalin  25 mg Oral QHS   rosuvastatin  20 mg Oral Daily   sodium chloride  flush  10-40 mL Intracatheter Q12H   Continuous Infusions:   LOS: 4 days   Fredia Skeeter, MD [facility]  11/06/2023, 9:02 AM   *Please note that this is a verbal dictation therefore any spelling or grammatical errors are due to the Dragon Medical One system interpretation.  Please page via Amion and do not message via secure chat for urgent patient care matters. Secure chat can be used for non urgent patient care matters.  How to contact the [facility] Attending or Consulting provider 7A - 7P or covering provider during after hours 7P -7A, for this patient?  Check the care team in [facility] and look for a) attending/consulting [facility] provider listed and b) the [facility] team listed. Page or secure chat 7A-7P. Log into [email or web address] and use [facility]'s universal password to access. If you do not have the password, please contact the hospital operator. Locate the [facility] provider you are looking for under [facility] and page to a number that you can be directly reached. If you still have difficulty reaching the provider, please page the [facility] (Director on Call) for the Hospitalists listed on amion for assistance.

## 2023-11-06 NOTE — Progress Notes (Signed)
 Physical Therapy Treatment Patient Details Name: [name] MRN: [id number] DOB: 01-22-1956 Today's Date: 11/06/2023   History of Present Illness 68 yo male admitted 11/02/23  with abdominal pain, vomiting, SBO, AKI. 6/22 Afib with RVR. PMhx: hep C, chronic pain, COPD, HTN, CAD, depression, gastric CA undergoing chemo    PT Comments  Patient fearful of continued loose stools and declined OOB mobility despite me offering to get him a brief.  He did agree to in bed LE strengthening and performed well.  Continue to follow while inpatient prior to d/c home.  Encouraged OOB to bathroom with assist when he needs to go for more mobility/strengthening.     If plan is discharge home, recommend the following: Assistance with cooking/housework;Assist for transportation;Help with stairs or ramp for entrance;Supervision due to cognitive status   Can travel by private vehicle        Equipment Recommendations  None recommended by PT    Recommendations for Other Services       Precautions / Restrictions Precautions Precautions: Fall Recall of Precautions/Restrictions: Intact     Mobility  Bed Mobility               General bed mobility comments: declined OOB due to frequent stools overnight, concern for stooling more and fatigued; agreed to bed level therex    Transfers                        Ambulation/Gait                   Stairs             Wheelchair Mobility     Tilt Bed    Modified Rankin (Stroke Patients Only)       Balance                                            Communication Communication Communication: No apparent difficulties  Cognition Arousal: Alert Behavior During Therapy: WFL for tasks assessed/performed   PT - Cognitive impairments: No apparent impairments                         Following commands: Intact      Cueing Cueing Techniques: Verbal cues  Exercises General Exercises - Lower  Extremity Heel Slides: Strengthening, 10 reps, Supine, Both Hip ABduction/ADduction: Strengthening, 10 reps, Supine, Both (hooklying) Straight Leg Raises: Strengthening, 10 reps, Supine, Both Low Level/ICU Exercises Stabilized Bridging: Strengthening, 10 reps, Supine, Both Other Exercises Other Exercises: low trunk rotation x 10 in hooklying    General Comments General comments (skin integrity, edema, etc.): Encouraged mobility to walk to bathroom for strengthening, he reports he has been except when stooling quickly using BSC      Pertinent Vitals/Pain Pain Assessment Pain Assessment: Faces Faces Pain Scale: No hurt    Home Living                          Prior Function            PT Goals (current goals can now be found in the care plan section) Progress towards PT goals: Not progressing toward goals - comment    Frequency    Min 2X/week      PT Plan  Co-evaluation              AM-PAC PT 6 Clicks Mobility   Outcome Measure    Help needed moving from lying on your back to sitting on the side of a flat bed without using bedrails?: None Help needed moving to and from a bed to a chair (including a wheelchair)?: A Little Help needed standing up from a chair using your arms (e.g., wheelchair or bedside chair)?: A Little Help needed to walk in hospital room?: A Little Help needed climbing 3-5 steps with a railing? : A Little 6 Click Score: 16    End of Session   Activity Tolerance: Patient limited by fatigue Patient left: in bed;with call bell/phone within reach   PT Visit Diagnosis: Other abnormalities of gait and mobility (R26.89);Muscle weakness (generalized) (M62.81)     Time: 8440-8391 PT Time Calculation (min) (ACUTE ONLY): 9 min  Charges:    $Therapeutic Exercise: 8-22 mins PT General Charges $$ ACUTE PT VISIT: 1 Visit                     Micheline Portal, PT Acute Rehabilitation  Services [phone] 11/06/2023    Montie Portal 11/06/2023, 5:11 PM

## 2023-11-06 NOTE — Plan of Care (Signed)

## 2023-11-06 NOTE — Progress Notes (Signed)
 Subjective: Was hoping to go home today.  Still with diarrhea. Tolerating FLD.  Not a great appetite.  ROS: See above, otherwise other systems negative  Objective: Vital signs in last 24 hours: Temp:  [97.4 F (36.3 C)-98.2 F (36.8 C)] 97.4 F (36.3 C) (06/25 0825) Pulse Rate:  [76-81] 81 (06/25 0825) Resp:  [18-19] 19 (06/25 0825) BP: (138-151)/(82-90) 151/90 (06/25 0825) SpO2:  [97 %-100 %] 97 % (06/25 0825) Weight:  [75.4 kg] 75.4 kg (06/25 0500) Last BM Date : 11/05/23  Intake/Output from previous day: No intake/output data recorded. Intake/Output this shift: Total I/O In: 360 [P.O.:360] Out: -   PE: Gen: NAD Abd: soft, still with some mild diffuse tenderness, +BS  Lab Results:  Recent Labs    11/05/23 0421 11/06/23 0434  WBC 7.7 13.1*  HGB 11.0* 11.8*  HCT 31.8* 33.5*  PLT 166 210   BMET Recent Labs    11/04/23 0349 11/06/23 0434  NA 132* 131*  K 3.3* 2.7*  CL 104 105  CO2 20* 17*  GLUCOSE 109* 94  BUN 31* 15  CREATININE 1.25* 1.13  CALCIUM  7.9* 8.7*   PT/INR No results for input(s): LABPROT, INR in the last 72 hours. CMP     Component Value Date/Time   NA 131 (L) 11/06/2023 0434   K 2.7 (LL) 11/06/2023 0434   CL 105 11/06/2023 0434   CO2 17 (L) 11/06/2023 0434   GLUCOSE 94 11/06/2023 0434   BUN 15 11/06/2023 0434   CREATININE 1.13 11/06/2023 0434   CREATININE 0.85 10/10/2023 0824   CALCIUM  8.7 (L) 11/06/2023 0434   PROT 5.9 (L) 11/02/2023 2014   ALBUMIN 2.5 (L) 11/02/2023 2014   AST 26 11/02/2023 2014   AST 15 10/10/2023 0824   ALT 13 11/02/2023 2014   ALT 11 10/10/2023 0824   ALKPHOS 24 (L) 11/02/2023 2014   BILITOT 1.1 11/02/2023 2014   BILITOT 0.5 10/10/2023 0824   GFRNONAA >60 11/06/2023 0434   GFRNONAA >60 10/10/2023 0824   GFRAA  10/16/2007 1246    >60        The eGFR has been calculated using the MDRD equation. This calculation has not been validated in all clinical   Lipase  No results found for:  LIPASE     Studies/Results: No results found.   Anti-infectives: Anti-infectives (From admission, onward)    Start     Dose/Rate Route Frequency Ordered Stop   11/04/23 0200  ceFEPIme (MAXIPIME) 2 g in sodium chloride  0.9 % 100 mL IVPB  Status:  Discontinued        2 g 200 mL/hr over 30 Minutes Intravenous Every 12 hours 11/03/23 1451 11/04/23 0953   11/03/23 2200  metroNIDAZOLE (FLAGYL) IVPB 500 mg  Status:  Discontinued        500 mg 100 mL/hr over 60 Minutes Intravenous Every 12 hours 11/02/23 2137 11/03/23 1145   11/03/23 1445  vancomycin (VANCOREADY) IVPB 1750 mg/350 mL  Status:  Discontinued        1,750 mg 175 mL/hr over 120 Minutes Intravenous Daily 11/03/23 1347 11/04/23 0855   11/03/23 1400  ceFEPIme (MAXIPIME) 2 g in sodium chloride  0.9 % 100 mL IVPB  Status:  Discontinued        2 g 200 mL/hr over 30 Minutes Intravenous Every 8 hours 11/03/23 1356 11/03/23 1451   11/03/23 1245  metroNIDAZOLE (FLAGYL) IVPB 500 mg  Status:  Discontinued  500 mg 100 mL/hr over 60 Minutes Intravenous Every 12 hours 11/03/23 1145 11/04/23 0953   11/03/23 0200  ceFEPIme (MAXIPIME) 2 g in sodium chloride  0.9 % 100 mL IVPB  Status:  Discontinued        2 g 200 mL/hr over 30 Minutes Intravenous Every 12 hours 11/02/23 2137 11/03/23 1356   11/02/23 2137  vancomycin variable dose per unstable renal function (pharmacist dosing)  Status:  Discontinued         Does not apply See admin instructions 11/02/23 2137 11/03/23 1347        Assessment/Plan Ileus possibly secondary to gastroenteritis, h/o gastric adenocarcinoma of the cardia -+ BM  -tolerating FLD, will adv to soft diet -suspect this may be ileus after gastroenteritis given history.  Some discomfort in abdomen is not uncommon, but will monitor -if diarrhea persists, could consider stool studies or GI consult if felt warranted. -no needs for surgical intervention at this time -he will follow up with Dr. Shyrl for his  gastric cancer and he will see Dr. Dasie when chemo completed and Dr. Lanny will arrange this.  FEN - soft VTE - Eliquis ID - none currently  I reviewed nursing notes, hospitalist notes, last 24 h vitals and pain scores, last 48 h intake and output, last 24 h labs and trends, and last 24 h imaging results.   LOS: 4 days    Burnard FORBES Banter , [facility] 11/06/2023, 10:38 AM Please see Amion for pager number during day hours 7:00am-4:30pm or 7:00am -11:30am on weekends

## 2023-11-06 NOTE — Discharge Instructions (Signed)

## 2023-11-06 NOTE — Evaluation (Signed)
 Occupational Therapy Evaluation Patient Details Name: [name] MRN: [id number] DOB: 05-29-1955 Today's Date: 11/06/2023   History of Present Illness   68 yo male admitted 11/02/23  with abdominal pain, vomiting, SBO, AKI. 6/22 Afib with RVR. PMhx: hep C, chronic pain, COPD, HTN, CAD, depression, gastric CA undergoing chemo     Clinical Impressions Pt reported at PLOF they live alone but they have some family support with the return to home. Pt reported they had a rough night and feeling weak but agreeable to complete light sponge bath at EOB with set up for UE/LE while sitting but supervision to CGA with transfers. At this time recommendation for [facility] and Acute Occupational Therapy to follow.      If plan is discharge home, recommend the following:   A little help with walking and/or transfers;A little help with bathing/dressing/bathroom;Assistance with cooking/housework;Assist for transportation     Functional Status Assessment   Patient has had a recent decline in their functional status and demonstrates the ability to make significant improvements in function in a reasonable and predictable amount of time.     Equipment Recommendations   Other (comment) (TBD)     Recommendations for Other Services         Precautions/Restrictions   Precautions Precautions: Fall Precaution/Restrictions Comments: reports one fall in past 6 months Restrictions Weight Bearing Restrictions Per Provider Order: No     Mobility Bed Mobility Overal bed mobility: Modified Independent             General bed mobility comments: increase in time as feeling weak    Transfers Overall transfer level: Needs assistance   Transfers: Sit to/from Stand Sit to Stand: Supervision                  Balance Overall balance assessment: Needs assistance Sitting-balance support: Feet supported Sitting balance-Leahy Scale: Good     Standing balance support: No upper extremity  supported Standing balance-Leahy Scale: Fair Standing balance comment: pt ambulated very limited                           ADL either performed or assessed with clinical judgement   ADL Overall ADL's : Needs assistance/impaired Eating/Feeding: Independent;Sitting   Grooming: Wash/dry hands;Sitting;Standing   Upper Body Bathing: Set up;Sitting   Lower Body Bathing: Set up;Sit to/from stand   Upper Body Dressing : Set up;Sitting   Lower Body Dressing: Set up;Sit to/from stand   Toilet Transfer: Supervision/safety;BSC/3in1   Toileting- Architect and Hygiene: Supervision/safety;Sit to/from stand       Functional mobility during ADLs: Supervision/safety General ADL Comments: only back forth to [facility]     Vision Baseline Vision/History: 1 Wears glasses Ability to See in Adequate Light: 0 Adequate Patient Visual Report: No change from baseline Vision Assessment?: Wears glasses for driving     Perception Perception: Within Functional Limits       Praxis Praxis: WFL       Pertinent Vitals/Pain Pain Assessment Pain Assessment: 0-10 Pain Score: 0-No pain Faces Pain Scale: No hurt     Extremity/Trunk Assessment Upper Extremity Assessment Upper Extremity Assessment: Overall WFL for tasks assessed   Lower Extremity Assessment Lower Extremity Assessment: Defer to PT evaluation   Cervical / Trunk Assessment Cervical / Trunk Assessment: Normal   Communication Communication Communication: No apparent difficulties   Cognition Arousal: Alert Behavior During Therapy: WFL for tasks assessed/performed Cognition: No apparent impairments  Following commands: Intact       Cueing  General Comments   Cueing Techniques: Verbal cues      Exercises     Shoulder Instructions      Home Living Family/patient expects to be discharged to:: Private residence Living Arrangements: Alone Available Help at  Discharge: Family;Available PRN/intermittently Type of Home: House Home Access: Stairs to enter Entrance Stairs-Number of Steps: 1&1   Home Layout: One level     Bathroom Shower/Tub: Chief Strategy Officer: Standard     Home Equipment: Grab bars - tub/shower;Rolling Environmental consultant (2 wheels);Cane - single point          Prior Functioning/Environment Prior Level of Function : Independent/Modified Independent             Mobility Comments: drives, cooks, manages meds, cleans, not using asstive devices previously      OT Problem List: Decreased strength;Impaired balance (sitting and/or standing);Decreased knowledge of use of DME or AE   OT Treatment/Interventions: Self-care/ADL training;DME and/or AE instruction;Therapeutic activities;Patient/family education;Balance training      OT Goals(Current goals can be found in the care plan section)   Acute Rehab OT Goals Patient Stated Goal: to get stronger OT Goal Formulation: With patient Time For Goal Achievement: 11/20/23 Potential to Achieve Goals: Good   OT Frequency:  Min 1X/week    Co-evaluation              AM-PAC OT 6 Clicks Daily Activity     Outcome Measure Help from another person eating meals?: None Help from another person taking care of personal grooming?: None Help from another person toileting, which includes using toliet, bedpan, or urinal?: A Little Help from another person bathing (including washing, rinsing, drying)?: A Little Help from another person to put on and taking off regular upper body clothing?: None Help from another person to put on and taking off regular lower body clothing?: A Little 6 Click Score: 21   End of Session Equipment Utilized During Treatment: Gait belt Nurse Communication: Mobility status  Activity Tolerance: Patient limited by fatigue Patient left: in bed;with call bell/phone within reach  OT Visit Diagnosis: Unsteadiness on feet (R26.81);Muscle weakness  (generalized) (M62.81)                Time: 9183-9156 OT Time Calculation (min): 27 min Charges:  OT General Charges $OT Visit: 1 Visit OT Evaluation $OT Eval Low Complexity: 1 Low OT Treatments $Self Care/Home Management : 8-22 mins  Warrick POUR OTR/L  Acute Rehab Services  [phone] office number   Warrick Berber 11/06/2023, 10:02 AM

## 2023-11-07 ENCOUNTER — Other Ambulatory Visit

## 2023-11-07 ENCOUNTER — Encounter: Admitting: Dietician

## 2023-11-07 ENCOUNTER — Ambulatory Visit: Admitting: Hematology

## 2023-11-07 ENCOUNTER — Ambulatory Visit

## 2023-11-07 DIAGNOSIS — R6521 Severe sepsis with septic shock: Secondary | ICD-10-CM | POA: Diagnosis not present

## 2023-11-07 DIAGNOSIS — A419 Sepsis, unspecified organism: Secondary | ICD-10-CM | POA: Diagnosis not present

## 2023-11-07 LAB — CBC WITH DIFFERENTIAL/PLATELET
Abs Immature Granulocytes: 5.29 10*3/uL — ABNORMAL HIGH (ref 0.00–0.07)
Basophils Absolute: 0.1 10*3/uL (ref 0.0–0.1)
Basophils Relative: 0 %
Eosinophils Absolute: 0 10*3/uL (ref 0.0–0.5)
Eosinophils Relative: 0 %
HCT: 33.4 % — ABNORMAL LOW (ref 39.0–52.0)
Hemoglobin: 11.4 g/dL — ABNORMAL LOW (ref 13.0–17.0)
Immature Granulocytes: 22 %
Lymphocytes Relative: 13 %
Lymphs Abs: 3.1 10*3/uL (ref 0.7–4.0)
MCH: 31.9 pg (ref 26.0–34.0)
MCHC: 34.1 g/dL (ref 30.0–36.0)
MCV: 93.6 fL (ref 80.0–100.0)
Monocytes Absolute: 3.9 10*3/uL — ABNORMAL HIGH (ref 0.1–1.0)
Monocytes Relative: 16 %
Neutro Abs: 11.2 10*3/uL — ABNORMAL HIGH (ref 1.7–7.7)
Neutrophils Relative %: 49 %
Platelets: 268 10*3/uL (ref 150–400)
RBC: 3.57 MIL/uL — ABNORMAL LOW (ref 4.22–5.81)
RDW: 14.7 % (ref 11.5–15.5)
WBC: 23.6 10*3/uL — ABNORMAL HIGH (ref 4.0–10.5)
nRBC: 0 % (ref 0.0–0.2)

## 2023-11-07 LAB — BASIC METABOLIC PANEL WITH GFR
Anion gap: 4 — ABNORMAL LOW (ref 5–15)
BUN: 12 mg/dL (ref 8–23)
CO2: 19 mmol/L — ABNORMAL LOW (ref 22–32)
Calcium: 8.8 mg/dL — ABNORMAL LOW (ref 8.9–10.3)
Chloride: 110 mmol/L (ref 98–111)
Creatinine, Ser: 1.12 mg/dL (ref 0.61–1.24)
GFR, Estimated: >60 mL/min (ref >60)
Glucose, Bld: 127 mg/dL — ABNORMAL HIGH (ref 70–99)
Potassium: 3.7 mmol/L (ref 3.5–5.1)
Sodium: 133 mmol/L — ABNORMAL LOW (ref 135–145)

## 2023-11-07 LAB — C DIFFICILE (CDIFF) QUICK SCRN (NO PCR REFLEX)
C Diff antigen: NEGATIVE
C Diff interpretation: NOT DETECTED
C Diff toxin: NEGATIVE

## 2023-11-07 LAB — MAGNESIUM: Magnesium: 1.6 mg/dL — ABNORMAL LOW (ref 1.7–2.4)

## 2023-11-07 MED ORDER — MAGNESIUM SULFATE 2 GM/50ML IV SOLN
2.0000 g | Freq: Once | INTRAVENOUS | Status: AC
  Administered 2023-11-07: 2 g via INTRAVENOUS
  Filled 2023-11-07: qty 50

## 2023-11-07 NOTE — Plan of Care (Signed)

## 2023-11-07 NOTE — Progress Notes (Signed)
 PT Cancellation Note  Patient Details Name: [name] MRN: [id number] DOB: August 19, 1955   Cancelled Treatment:    Reason Eval/Treat Not Completed: Fatigue/lethargy limiting ability to participate;Pain limiting ability to participate; reports continued poor sleep overnight with at least 10 episodes of diarrhea and still waiting on pain meds for abdominal pain.  Noted mobility specialist walked with pt this am though some unsteadiness without RW.  Will continue attempts.    Montie Portal 11/07/2023, 5:07 PM Micheline Portal, PT Acute Rehabilitation Services [phone] 11/07/2023

## 2023-11-07 NOTE — Progress Notes (Signed)
 PROGRESS NOTE    [name]  [name] DOB: 05-16-1955 DOA: 11/02/2023 PCP: Sun, Vyvyan, MD   Brief Narrative:  68 y.o. M with PMH significant for gastric adenocarcimona on neoadjuvant chemotherapy who presented to [facility] ED with approximately 2 days of diffuse abdominal pain, nausea and vomiting. His work-up there revealed on SBO with transition point, WBC 1.8, creatinine elevation to 2 up from baseline <1, lactic acid 5.1, new oxygen requirement on non-rebreather with tachycardia and hypotension, though vital signs improved with IVF on arrival to [place]. On arrival his MAP was in the 70's without pressors. CT chest without contrast at [facility] did not reveal an acute cause for his hypoxia. Denies chest pain, dyspnea, or hematemesis, states his last BM was earlier today and was dark. Pt was seen by the surgery team and plan for admission and medical management   Assessment & Plan:   Principal Problem:   Sepsis (HCC) Active Problems:   Gastric cancer (HCC)   Chronic obstructive pulmonary disease (HCC)   Benign essential hypertension   Diabetic renal disease (HCC)   Chronic pain syndrome   Gastro-esophageal reflux disease without esophagitis   Hypercholesterolemia   Major depressive disorder with single episode, in full remission (HCC)   Nicotine dependence, cigarettes, uncomplicated   Bowel obstruction (HCC)   Neutropenia with fever (HCC)   Hypoxic respiratory failure (HCC)  Hypovolemic shock Likely due to gastroenteritis.  Required pressors in the ICU.  Was aggressively hydrated.  Has improved.  Sepsis has been ruled out.   Ileus in the setting of viral gastroenteritis in the setting of gastric adenocarcinoma/diarrhea Seen by general surgery.  Had NG tube which was taken out today before yesterday.  Tolerating soft diet but having diarrhea.  No nausea or vomiting.  Continues to complain of abdominal pain and has abdominal tenderness, more pronounced at right lower  quadrant.  Afebrile but has significant jump in leukocytosis today.  Raising concern for infectious etiology of the diarrhea, will check ova and parasite, GI pathogen panel, as well as C. difficile.  Discussed with ID.   Gastric carcinoma Undergoing adjuvant chemotherapy.  Plan is for surgery in a few months.  Paroxysmal atrial fibrillation Developed atrial fibrillation in the setting of hypotension.  Looks like he briefly required amiodarone.  Was also started on heparin  infusion. Echocardiogram showed normal LVEF.  No significant valvular disease noted.  Will order TSH. Patient denies any coronary artery disease.  Denies any vascular disease.  Does not take any blood thinners or antiplatelets at home.  Does admit to history of diabetes.  CHADS2 vascular score is 3.  Anticoagulation was discussed with the patient.  He is agreeable to it.  Switched from heparin  to Eliquis.  Metoprolol started.  Rates controlled.   Acute respiratory failure with hypoxia/history of COPD CTA was negative for PE.  Currently off of oxygen.  Respiratory status is stable.   History of depression Continue home medications.   Acute kidney injury Creatinine peaked at 2.14.  Improved and resolved with IV fluids.   Normocytic anemia Stable.   Hypokalemia and hyponatremia Potassium normal today but low magnesium .  Will replenish.  Dehydration?:  Received IV fluids, appears better clinically.  Encouraged p.o. intake.  DVT prophylaxis: SCDs Start: 11/02/23 2157   Code Status: Full Code  Family Communication:  None present at bedside.  Plan of care discussed with patient in length and he/she verbalized understanding and agreed with it.  Will call his Sister [name] this afternoon per his  request.  Status is: Inpatient Remains inpatient appropriate because: Still with diarrhea, needs further testing.   Estimated body mass index is 23.65 kg/m as calculated from the following:   Height as of this encounter: 5' 11  (1.803 m).   Weight as of this encounter: 76.9 kg.    Nutritional Assessment: Body mass index is 23.65 kg/m.[name] Seen by dietician.  I agree with the assessment and plan as outlined below: Nutrition Status:        . Skin Assessment: I have examined the patient's skin and I agree with the wound assessment as performed by the wound care RN as outlined below:    Consultants:  General surgery  Procedures:  None  Antimicrobials:  Anti-infectives (From admission, onward)    Start     Dose/Rate Route Frequency Ordered Stop   11/04/23 0200  ceFEPIme (MAXIPIME) 2 g in sodium chloride  0.9 % 100 mL IVPB  Status:  Discontinued        2 g 200 mL/hr over 30 Minutes Intravenous Every 12 hours 11/03/23 1451 11/04/23 0953   11/03/23 2200  metroNIDAZOLE (FLAGYL) IVPB 500 mg  Status:  Discontinued        500 mg 100 mL/hr over 60 Minutes Intravenous Every 12 hours 11/02/23 2137 11/03/23 1145   11/03/23 1445  vancomycin (VANCOREADY) IVPB 1750 mg/350 mL  Status:  Discontinued        1,750 mg 175 mL/hr over 120 Minutes Intravenous Daily 11/03/23 1347 11/04/23 0855   11/03/23 1400  ceFEPIme (MAXIPIME) 2 g in sodium chloride  0.9 % 100 mL IVPB  Status:  Discontinued        2 g 200 mL/hr over 30 Minutes Intravenous Every 8 hours 11/03/23 1356 11/03/23 1451   11/03/23 1245  metroNIDAZOLE (FLAGYL) IVPB 500 mg  Status:  Discontinued        500 mg 100 mL/hr over 60 Minutes Intravenous Every 12 hours 11/03/23 1145 11/04/23 0953   11/03/23 0200  ceFEPIme (MAXIPIME) 2 g in sodium chloride  0.9 % 100 mL IVPB  Status:  Discontinued        2 g 200 mL/hr over 30 Minutes Intravenous Every 12 hours 11/02/23 2137 11/03/23 1356   11/02/23 2137  vancomycin variable dose per unstable renal function (pharmacist dosing)  Status:  Discontinued         Does not apply See admin instructions 11/02/23 2137 11/03/23 1347         Subjective: Patient seen and examined.  No complaints other than diarrhea.  He claims  having at least 10 bowel movements last night however only 6 or documented in last 24 hours.  Objective: Vitals:   11/07/23 0046 11/07/23 0500 11/07/23 0824 11/07/23 1001  BP: (!) 148/88  131/80 (!) 140/86  Pulse: 80  80 78  Resp: 18  19   Temp: 98 F (36.7 C)  98.3 F (36.8 C)   TempSrc:   Oral   SpO2: 97%  98%   Weight:  76.9 kg    Height:        Intake/Output Summary (Last 24 hours) at 11/07/2023 1215 Last data filed at 11/07/2023 0830 Gross per 24 hour  Intake 1220 ml  Output --  Net 1220 ml   Filed Weights   11/05/23 0714 11/06/23 0500 11/07/23 0500  Weight: 81 kg 75.4 kg 76.9 kg    Examination:  General exam: Appears calm and comfortable  Respiratory system: Clear to auscultation. Respiratory effort normal. Cardiovascular system: S1 & S2  heard, RRR. No JVD, murmurs, rubs, gallops or clicks. No pedal edema. Gastrointestinal system: Abdomen is nondistended, soft and mild generalized abdominal tenderness, more pronounced at right lower quadrant. No organomegaly or masses felt. Normal bowel sounds heard. Central nervous system: Alert and oriented. No focal neurological deficits. Extremities: Symmetric 5 x 5 power. Skin: No rashes, lesions or ulcers.  Psychiatry: Judgement and insight appear normal. Mood & affect appropriate.    Data Reviewed: I have personally reviewed following labs and imaging studies  CBC: Recent Labs  Lab 11/02/23 2014 11/03/23 0206 11/04/23 0349 11/05/23 0421 11/06/23 0434 11/07/23 0434  WBC 1.7* 1.6* 4.3 7.7 13.1* 23.6*  NEUTROABS 0.1*  --   --   --   --  11.2*  HGB 11.7* 11.3* 10.3* 11.0* 11.8* 11.4*  HCT 32.6* 32.1* 28.8* 31.8* 33.5* 33.4*  MCV 92.9 93.0 92.9 94.1 92.0 93.6  PLT 178 168 149* 166 210 268   Basic Metabolic Panel: Recent Labs  Lab 11/02/23 2014 11/03/23 0206 11/04/23 0349 11/06/23 0434 11/07/23 0434  NA 129* 129* 132* 131* 133*  K 3.6 3.4* 3.3* 2.7* 3.7  CL 95* 95* 104 105 110  CO2 19* 20* 20* 17* 19*   GLUCOSE 164* 139* 109* 94 127*  BUN 35* 33* 31* 15 12  CREATININE 2.14* 1.50* 1.25* 1.13 1.12  CALCIUM  8.7* 8.4* 7.9* 8.7* 8.8*  MG  --  1.8  --  1.8 1.6*  PHOS  --  2.9  --   --   --    GFR: Estimated Creatinine Clearance: 68.2 mL/min (by C-G formula based on SCr of 1.12 mg/dL). Liver Function Tests: Recent Labs  Lab 11/02/23 2014  AST 26  ALT 13  ALKPHOS 24*  BILITOT 1.1  PROT 5.9*  ALBUMIN 2.5*   No results for input(s): LIPASE, AMYLASE in the last 168 hours. No results for input(s): AMMONIA in the last 168 hours. Coagulation Profile: No results for input(s): INR, PROTIME in the last 168 hours. Cardiac Enzymes: No results for input(s): CKTOTAL, CKMB, CKMBINDEX, TROPONINI in the last 168 hours. BNP (last 3 results) No results for input(s): PROBNP in the last 8760 hours. HbA1C: No results for input(s): HGBA1C in the last 72 hours. CBG: Recent Labs  Lab 11/03/23 1125 11/03/23 2330  GLUCAP 125* 116*   Lipid Profile: No results for input(s): CHOL, HDL, LDLCALC, TRIG, CHOLHDL, LDLDIRECT in the last 72 hours. Thyroid  Function Tests: Recent Labs    11/06/23 0434  TSH 1.145   Anemia Panel: No results for input(s): VITAMINB12, FOLATE, FERRITIN, TIBC, IRON, RETICCTPCT in the last 72 hours. Sepsis Labs: Recent Labs  Lab 11/02/23 2014 11/03/23 0027 11/03/23 0901  LATICACIDVEN 3.5* 1.9 1.7    Recent Results (from the past 240 hours)  MRSA Next Gen by PCR, Nasal     Status: None   Collection Time: 11/02/23 10:01 PM   Specimen: Nasal Mucosa; Nasal Swab  Result Value Ref Range Status   MRSA by PCR Next Gen NOT DETECTED NOT DETECTED Final    Comment: (NOTE) The GeneXpert MRSA Assay (FDA approved for NASAL specimens only), is one component of a comprehensive MRSA colonization surveillance program. It is not intended to diagnose MRSA infection nor to guide or monitor treatment for MRSA infections. Test performance is  not FDA approved in patients less than 30 years old. Performed at [facility], [address], [place], [place] [address]      Radiology Studies: No results found.  Scheduled Meds:  amLODipine  10 mg Oral Daily   apixaban  5 mg Oral BID   buPROPion  150 mg Oral q morning   Chlorhexidine  Gluconate Cloth  6 each Topical Daily   linaclotide  145 mcg Oral QHS   metoprolol tartrate  50 mg Oral BID   pantoprazole   40 mg Oral q morning   pregabalin  25 mg Oral QHS   rosuvastatin  20 mg Oral Daily   sodium chloride  flush  10-40 mL Intracatheter Q12H   Continuous Infusions:  magnesium  sulfate bolus IVPB       LOS: 5 days   Fredia Skeeter, MD [facility]  11/07/2023, 12:15 PM   *Please note that this is a verbal dictation therefore any spelling or grammatical errors are due to the Dragon Medical One system interpretation.  Please page via Amion and do not message via secure chat for urgent patient care matters. Secure chat can be used for non urgent patient care matters.  How to contact the [facility] Attending or Consulting provider 7A - 7P or covering provider during after hours 7P -7A, for this patient?  Check the care team in [facility] and look for a) attending/consulting [facility] provider listed and b) the [facility] team listed. Page or secure chat 7A-7P. Log into [email or web address] and use [place]'s universal password to access. If you do not have the password, please contact the hospital operator. Locate the [facility] provider you are looking for under [facility] and page to a number that you can be directly reached. If you still have difficulty reaching the provider, please page the [facility] (Director on Call) for the Hospitalists listed on amion for assistance.

## 2023-11-07 NOTE — Plan of Care (Signed)

## 2023-11-07 NOTE — Progress Notes (Signed)
 Mobility Specialist: Progress Note   11/07/23 1508  Mobility  Activity Ambulated with assistance in hallway  Level of Assistance Contact guard assist, steadying assist  Assistive Device None;Other (Comment) (occasional use of handrails)  Distance Ambulated (ft) 150 ft  Activity Response Tolerated well  Mobility Referral Yes  Mobility visit 1 Mobility  Mobility Specialist Start Time (ACUTE ONLY) 1145  Mobility Specialist Stop Time (ACUTE ONLY) 1155  Mobility Specialist Time Calculation (min) (ACUTE ONLY) 10 min    Pt received in bed, agreeable to mobility session. Requested to try ambulation w/o AD. MinG throughout. 2x occurrence of LOB but pt able to self correct with handrails. No complaints. Returned to room without fault. Left in chair with all needs met, call bell in reach.   Ileana Lute Mobility Specialist Please contact via SecureChat or Rehab office at [phone]

## 2023-11-08 ENCOUNTER — Inpatient Hospital Stay

## 2023-11-08 ENCOUNTER — Encounter (HOSPITAL_COMMUNITY): Payer: Self-pay

## 2023-11-08 DIAGNOSIS — A401 Sepsis due to streptococcus, group B: Secondary | ICD-10-CM | POA: Diagnosis not present

## 2023-11-08 LAB — GASTROINTESTINAL PANEL BY PCR, STOOL (REPLACES STOOL CULTURE)

## 2023-11-08 NOTE — Hospital Course (Signed)
 [name] is a 68 y.o. male with a history of gastric adenocarcinoma on neoadjuvant chemotherapy, hypertension, CAD, COPD, hepatitis C, chronic pain.  Patient presented secondary to abdominal pain with initial concern for sepsis requiring ICU admission and initiation of vasopressor support.  Patient initially given IV antibiotics for broad-spectrum coverage which were quickly discontinued.  Patient was able to wean off of vasopressor support.  During admission, patient's course was complicated by development of ileus requiring general surgery consultation and NG tube placement for decompression.  Patient was successfully managed with NG tube decompression with NG tube being removed successfully.

## 2023-11-08 NOTE — Plan of Care (Signed)

## 2023-11-08 NOTE — Progress Notes (Signed)
 Progress Note   Patient: [name] [name] DOB: 1955-05-21 DOA: 11/02/2023     6 DOS: the patient was seen and examined on 11/08/2023   Brief hospital course: 68 y.o. M with PMH significant for gastric adenocarcimona on neoadjuvant chemotherapy who presented to [facility] ED with approximately 2 days of diffuse abdominal pain, nausea and vomiting. His work-up there revealed on SBO with transition point, WBC 1.8, creatinine elevation to 2 up from baseline <1, lactic acid 5.1, new oxygen requirement on non-rebreather with tachycardia and hypotension, though vital signs improved with IVF on arrival to [place]. On arrival his MAP was in the 70's without pressors. CT chest without contrast at [facility] did not reveal an acute cause for his hypoxia. Denies chest pain, dyspnea, or hematemesis, states his last BM was earlier today and was dark. Pt was seen by the surgery team and plan for admission and medical management.   Patient had multiple episodes of diarrhea yesterday.  Today he had 2 episodes in the morning.  He still feels weak.  Assessment and Plan:  Hypovolemic shock Likely due to gastroenteritis.  Required pressors in the ICU.  Was aggressively hydrated.  Has improved.  Sepsis has been ruled out.   Ileus in the setting of viral gastroenteritis in the setting of gastric adenocarcinoma/diarrhea -Seen by general surgery.  S/p NG tube placement and removal. -Tolerating soft diet but having diarrhea. - No nausea or vomiting. - Continues to complain of abdominal pain and has abdominal tenderness, more pronounced at right lower quadrant. - Afebrile but has significant jump in leukocytosis today. - C. difficile colitis is negative - Diarrhea has slightly improved today 6/27   Gastric carcinoma  - Undergoing adjuvant chemotherapy. - Plan is for surgery in a few months.   Paroxysmal atrial fibrillation -Developed atrial fibrillation in the setting of hypotension. -Looks like he  briefly required amiodarone .   -Was also started on heparin  infusion. Echocardiogram showed normal LVEF.  No significant valvular disease noted.  Will order TSH. Patient denies any coronary artery disease.  Denies any vascular disease.  Does not take any blood thinners or antiplatelets at home.  Does admit to history of diabetes.  CHADS2 vascular score is 3.  Anticoagulation was discussed with the patient.  He is agreeable to it.  Switched from heparin  to Eliquis .  Metoprolol  started.  Rates controlled.   Acute respiratory failure with hypoxia/history of COPD CTA was negative for PE.  Currently off of oxygen.  Respiratory status is stable.   History of depression Continue home medications.   Acute kidney injury Creatinine peaked at 2.14.  Improved and resolved with IV fluids.   Normocytic anemia Stable.   Hypokalemia and hyponatremia Potassium normal today but low magnesium .  Will replenish.   Dehydration?:  Received IV fluids, appears better clinically.  Encouraged p.o. intake.      Subjective: Patient says he is feeling much better today but he still has diarrhea 2 episodes.  Physical Exam: Vitals:   11/08/23 0444 11/08/23 0500 11/08/23 0836 11/08/23 1112  BP: (!) 134/91  126/85 (!) 143/84  Pulse: 72  78 80  Resp: 17  19   Temp: 98.1 F (36.7 C)  98.1 F (36.7 C) 98.1 F (36.7 C)  TempSrc:      SpO2: 98%  99% 98%  Weight:  75.9 kg    Height:       Constitutional: Alert, awake, calm, comfortable HEENT: Neck supple Respiratory: clear to auscultation bilaterally, no wheezing, no crackles.  Normal respiratory effort. No accessory muscle use.  Cardiovascular: Regular rate and rhythm, no murmurs / rubs / gallops. No extremity edema. 2+ pedal pulses. No carotid bruits.  Abdomen: no tenderness, no masses palpated. No hepatosplenomegaly. Bowel sounds positive.  Musculoskeletal: no clubbing / cyanosis. No joint deformity upper and lower extremities. Good ROM, no contractures.  Normal muscle tone.  Skin: no rashes, lesions, ulcers. No induration Neurologic: CN 2-12 grossly intact. Sensation intact, DTR normal. Strength 5/5 x all 4 extremities.  Psychiatric: Normal judgment and insight. Alert and oriented x 3. Normal mood.    Data Reviewed:  C. difficile was negative, potassium improved to 3.7  Family Communication: No family member was available for communication  Disposition: Status is: Inpatient Remains inpatient appropriate because: Due to diarrhea but it has improved maybe he will be able to be discharged in next 1 to 2 days  Planned Discharge Destination: Home    Time spent: 35 minutes  Author: Nena Rebel, MD 11/08/2023 4:38 PM  For on call review [email or web address].

## 2023-11-08 NOTE — Progress Notes (Signed)
 Physical Therapy Treatment Patient Details Name: [name] MRN: [id number] DOB: 07-11-1955 Today's Date: 11/08/2023   History of Present Illness 68 yo male admitted 11/02/23  with abdominal pain, vomiting, SBO, AKI. 6/22 Afib with RVR. PMhx: hep C, chronic pain, COPD, HTN, CAD, depression, gastric CA undergoing chemo    PT Comments  Pt progressing well with mobility. He demo mod I bed mobility. Supervision transfers and CGA amb 300' without AD. Mildly unsteady gait but no handrail use in hallway. PT to continue per current POC.     If plan is discharge home, recommend the following: Assistance with cooking/housework;Assist for transportation;Help with stairs or ramp for entrance;Supervision due to cognitive status   Can travel by private vehicle        Equipment Recommendations  None recommended by PT    Recommendations for Other Services       Precautions / Restrictions Precautions Precautions: Fall Recall of Precautions/Restrictions: Intact     Mobility  Bed Mobility Overal bed mobility: Modified Independent                  Transfers Overall transfer level: Needs assistance Equipment used: None Transfers: Sit to/from Stand Sit to Stand: Supervision                Ambulation/Gait Ambulation/Gait assistance: Contact guard assist Gait Distance (Feet): 300 Feet Assistive device: None Gait Pattern/deviations: Step-through pattern, Decreased stride length, Drifts right/left Gait velocity: decreased Gait velocity interpretation: 1.31 - 2.62 ft/sec, indicative of limited community ambulator   General Gait Details: mild LOB as pt fatigued but able to self recover. No use of handrail.   Stairs             Wheelchair Mobility     Tilt Bed    Modified Rankin (Stroke Patients Only)       Balance Overall balance assessment: Needs assistance Sitting-balance support: Feet supported, No upper extremity supported Sitting balance-Leahy Scale:  Good     Standing balance support: No upper extremity supported, During functional activity Standing balance-Leahy Scale: Fair                              Hotel manager: No apparent difficulties  Cognition Arousal: Alert Behavior During Therapy: WFL for tasks assessed/performed   PT - Cognitive impairments: No apparent impairments                         Following commands: Intact      Cueing Cueing Techniques: Verbal cues  Exercises      General Comments General comments (skin integrity, edema, etc.): HR 87 after amb      Pertinent Vitals/Pain Pain Assessment Pain Assessment: No/denies pain    Home Living                          Prior Function            PT Goals (current goals can now be found in the care plan section) Acute Rehab PT Goals Patient Stated Goal: home PT Goal Formulation: With patient Time For Goal Achievement: 11/19/23 Potential to Achieve Goals: Good Progress towards PT goals: Progressing toward goals    Frequency    Min 2X/week      PT Plan      Co-evaluation              AM-PAC  PT 6 Clicks Mobility   Outcome Measure  Help needed turning from your back to your side while in a flat bed without using bedrails?: None Help needed moving from lying on your back to sitting on the side of a flat bed without using bedrails?: None Help needed moving to and from a bed to a chair (including a wheelchair)?: A Little Help needed standing up from a chair using your arms (e.g., wheelchair or bedside chair)?: A Little Help needed to walk in hospital room?: A Little Help needed climbing 3-5 steps with a railing? : A Little 6 Click Score: 20    End of Session Equipment Utilized During Treatment: Gait belt Activity Tolerance: Patient tolerated treatment well Patient left: in bed;with call bell/phone within reach Nurse Communication: Mobility status PT Visit Diagnosis: Other  abnormalities of gait and mobility (R26.89);Muscle weakness (generalized) (M62.81)     Time: 9089-9073 PT Time Calculation (min) (ACUTE ONLY): 16 min  Charges:    $Gait Training: 8-22 mins PT General Charges $$ ACUTE PT VISIT: 1 Visit                     Sari MATSU., PT  Office # [phone]    Erven Sari Shaker 11/08/2023, 10:42 AM

## 2023-11-09 DIAGNOSIS — R571 Hypovolemic shock: Secondary | ICD-10-CM | POA: Diagnosis not present

## 2023-11-09 DIAGNOSIS — G894 Chronic pain syndrome: Secondary | ICD-10-CM

## 2023-11-09 DIAGNOSIS — I10 Essential (primary) hypertension: Secondary | ICD-10-CM | POA: Diagnosis not present

## 2023-11-09 LAB — COMPREHENSIVE METABOLIC PANEL WITH GFR
ALT: 26 U/L (ref 0–44)
AST: 25 U/L (ref 15–41)
Albumin: 2.6 g/dL — ABNORMAL LOW (ref 3.5–5.0)
Alkaline Phosphatase: 27 U/L — ABNORMAL LOW (ref 38–126)
Anion gap: 5 (ref 5–15)
BUN: 10 mg/dL (ref 8–23)
CO2: 21 mmol/L — ABNORMAL LOW (ref 22–32)
Calcium: 9.1 mg/dL (ref 8.9–10.3)
Chloride: 107 mmol/L (ref 98–111)
Creatinine, Ser: 1.03 mg/dL (ref 0.61–1.24)
GFR, Estimated: >60 mL/min (ref >60)
Glucose, Bld: 111 mg/dL — ABNORMAL HIGH (ref 70–99)
Potassium: 3.5 mmol/L (ref 3.5–5.1)
Sodium: 133 mmol/L — ABNORMAL LOW (ref 135–145)
Total Bilirubin: 0.9 mg/dL (ref 0.0–1.2)
Total Protein: 5.5 g/dL — ABNORMAL LOW (ref 6.5–8.1)

## 2023-11-09 LAB — CBC
HCT: 32.5 % — ABNORMAL LOW (ref 39.0–52.0)
Hemoglobin: 11.3 g/dL — ABNORMAL LOW (ref 13.0–17.0)
MCH: 32.3 pg (ref 26.0–34.0)
MCHC: 34.8 g/dL (ref 30.0–36.0)
MCV: 92.9 fL (ref 80.0–100.0)
Platelets: 366 10*3/uL (ref 150–400)
RBC: 3.5 MIL/uL — ABNORMAL LOW (ref 4.22–5.81)
RDW: 15.4 % (ref 11.5–15.5)
WBC: 21.6 10*3/uL — ABNORMAL HIGH (ref 4.0–10.5)
nRBC: 0.1 % (ref 0.0–0.2)

## 2023-11-09 LAB — MAGNESIUM: Magnesium: 1.6 mg/dL — ABNORMAL LOW (ref 1.7–2.4)

## 2023-11-09 MED ORDER — POTASSIUM CHLORIDE CRYS ER 20 MEQ PO TBCR
40.0000 meq | EXTENDED_RELEASE_TABLET | Freq: Once | ORAL | Status: AC
  Administered 2023-11-09: 40 meq via ORAL
  Filled 2023-11-09: qty 2

## 2023-11-09 MED ORDER — MAGNESIUM SULFATE 4 GM/100ML IV SOLN
4.0000 g | Freq: Once | INTRAVENOUS | Status: AC
  Administered 2023-11-09: 4 g via INTRAVENOUS
  Filled 2023-11-09: qty 100

## 2023-11-09 NOTE — Progress Notes (Signed)
 PROGRESS NOTE    [name]  [name] DOB: 07/23/1955 DOA: 11/02/2023 PCP: Sun, Vyvyan, MD   Brief Narrative: [name] is a 68 y.o. male with a history of gastric adenocarcinoma on neoadjuvant chemotherapy, hypertension, CAD, COPD, hepatitis C, chronic pain.  Patient presented secondary to abdominal pain with initial concern for sepsis requiring ICU admission and initiation of vasopressor support.  Patient initially given IV antibiotics for broad-spectrum coverage which were quickly discontinued.  Patient was able to wean off of vasopressor support.  During admission, patient's course was complicated by development of ileus requiring general surgery consultation and NG tube placement for decompression.  Patient was successfully managed with NG tube decompression with NG tube being removed successfully.   Assessment and Plan:  Hypovolemic shock Insetting of gastroenteritis.  Patient was admitted to ICU for vasopressor support.  Patient was managed on vasopressors and IV hydration with improvement in blood pressure.  Sepsis was ruled out.  Ileus Insetting of acute infection with gastroenteritis.  Patient was evaluated by general surgery.  Patient was managed via NG tube placement on 6/21 which was successfully removed on 6/25 after return of bowel function.  Leukocytosis Unclear etiology for acute rise in white blood cell count.  Patient was initially leukopenic with acute rise of white blood cell count to a peak of 22,600 with no associated fevers.  Patient has no identifiable source for infection.  It appears patient has chronic leukocytosis May 2025 as an outpatient. - CBC in a.m.  Gastric carcinoma Noted.  Patient is on adjuvant chemotherapy as an outpatient with plan for surgery as an outpatient.  Paroxysmal atrial fibrillation Patient developed atrial fibrillation in setting of hypotension.  Patient was managed briefly on amiodarone  and started on heparin  IV.   Echocardiogram obtained with normal LVEF and no significant valvular disease.  TSH ordered and was 1.145.  Patient was transition from heparin  IV to Eliquis  and metoprolol  started for rate control. - Continue Eliquis  - Continue metoprolol   Acute respite failure with hypoxia CTA obtained this admission was negative for acute PE.  Patient was weaned off oxygen and is currently stable on room air.  History of COPD Noted.  Patient is not on medication management as an outpatient.  Normocytic anemia Chronic since May 2025 and currently stable.  Hypokalemia Potassium down to 2.7.  Patient received potassium supplementation with improvement of potassium.  Hyponatremia Mild.  Chronic pain syndrome - Continue oxycodone  as needed   DVT prophylaxis: Eliquis  Code Status:   Code Status: Full Code Family Communication: Sister on telephone at bedside Disposition Plan: Discharge home likely in 24 hours if leukocytosis is improving   Consultants:  PCCM General Surgery  Procedures:    Antimicrobials:     Subjective: Patient reports feeling well. Significant diarrhea two nights prior. Some milder symptoms last night.  Objective: BP (!) 126/90 (BP Location: Left Arm)   Pulse 73   Temp 97.9 F (36.6 C)   Resp 18   Ht 5' 11 (1.803 m)   Wt 74.6 kg   SpO2 98%   BMI 22.93 kg/m   Examination:  General exam: Appears calm and comfortable Respiratory system: Clear to auscultation. Respiratory effort normal. Cardiovascular system: S1 & S2 heard, RRR. No murmurs, rubs, gallops or clicks. Gastrointestinal system: Abdomen is nondistended, soft and nontender. Normal bowel sounds heard. Central nervous system: Alert and oriented. No focal neurological deficits. Musculoskeletal: No edema. No calf tenderness Psychiatry: Judgement and insight appear normal. Mood & affect appropriate.  Data Reviewed: I have personally reviewed following labs and imaging studies  CBC Lab Results   Component Value Date   WBC 21.6 (H) 11/09/2023   RBC 3.50 (L) 11/09/2023   HGB 11.3 (L) 11/09/2023   HCT 32.5 (L) 11/09/2023   MCV 92.9 11/09/2023   MCH 32.3 11/09/2023   PLT 366 11/09/2023   MCHC 34.8 11/09/2023   RDW 15.4 11/09/2023   LYMPHSABS 3.1 11/07/2023   MONOABS 3.9 (H) 11/07/2023   EOSABS 0.0 11/07/2023   BASOSABS 0.1 11/07/2023     Last metabolic panel Lab Results  Component Value Date   NA 133 (L) 11/09/2023   K 3.5 11/09/2023   CL 107 11/09/2023   CO2 21 (L) 11/09/2023   BUN 10 11/09/2023   CREATININE 1.03 11/09/2023   GLUCOSE 111 (H) 11/09/2023   GFRNONAA >60 11/09/2023   GFRAA  10/16/2007    >60        The eGFR has been calculated using the MDRD equation. This calculation has not been validated in all clinical   CALCIUM  9.1 11/09/2023   PHOS 2.9 11/03/2023   PROT 5.5 (L) 11/09/2023   ALBUMIN 2.6 (L) 11/09/2023   BILITOT 0.9 11/09/2023   ALKPHOS 27 (L) 11/09/2023   AST 25 11/09/2023   ALT 26 11/09/2023   ANIONGAP 5 11/09/2023    GFR: Estimated Creatinine Clearance: 73.4 mL/min (by C-G formula based on SCr of 1.03 mg/dL).  Recent Results (from the past 240 hours)  MRSA Next Gen by PCR, Nasal     Status: None   Collection Time: 11/02/23 10:01 PM   Specimen: Nasal Mucosa; Nasal Swab  Result Value Ref Range Status   MRSA by PCR Next Gen NOT DETECTED NOT DETECTED Final    Comment: (NOTE) The GeneXpert MRSA Assay (FDA approved for NASAL specimens only), is one component of a comprehensive MRSA colonization surveillance program. It is not intended to diagnose MRSA infection nor to guide or monitor treatment for MRSA infections. Test performance is not FDA approved in patients less than 44 years old. Performed at [facility], [address] [place], [place] [address]   Gastrointestinal Panel by PCR , Stool     Status: None   Collection Time: 11/07/23  4:50 PM   Specimen: Stool  Result Value Ref Range Status   Campylobacter species NOT  DETECTED NOT DETECTED Final   Plesimonas shigelloides NOT DETECTED NOT DETECTED Final   Salmonella species NOT DETECTED NOT DETECTED Final   Yersinia enterocolitica NOT DETECTED NOT DETECTED Final   Vibrio species NOT DETECTED NOT DETECTED Final   Vibrio cholerae NOT DETECTED NOT DETECTED Final   Enteroaggregative E coli (EAEC) NOT DETECTED NOT DETECTED Final   Enteropathogenic E coli (EPEC) NOT DETECTED NOT DETECTED Final   Enterotoxigenic E coli (ETEC) NOT DETECTED NOT DETECTED Final   Shiga like toxin producing E coli (STEC) NOT DETECTED NOT DETECTED Final   Shigella/Enteroinvasive E coli (EIEC) NOT DETECTED NOT DETECTED Final   Cryptosporidium NOT DETECTED NOT DETECTED Final   Cyclospora cayetanensis NOT DETECTED NOT DETECTED Final   Entamoeba histolytica NOT DETECTED NOT DETECTED Final   Giardia lamblia NOT DETECTED NOT DETECTED Final   Adenovirus F40/41 NOT DETECTED NOT DETECTED Final   Astrovirus NOT DETECTED NOT DETECTED Final   Norovirus GI/GII NOT DETECTED NOT DETECTED Final   Rotavirus A NOT DETECTED NOT DETECTED Final   Sapovirus (I, II, IV, and V) NOT DETECTED NOT DETECTED Final    Comment: Performed  at [facility], [address] [place], [place] [address]  C Difficile Quick Screen (NO PCR Reflex)     Status: None   Collection Time: 11/07/23  4:50 PM   Specimen: Stool  Result Value Ref Range Status   C Diff antigen NEGATIVE NEGATIVE Final   C Diff toxin NEGATIVE NEGATIVE Final   C Diff interpretation No C. difficile detected.  Final    Comment: Performed at [facility], [address] [place], [place] [address]      Radiology Studies: No results found.    LOS: 7 days    Elgin Lam, MD [facility] 11/09/2023, 10:18 AM   If 7PM-7AM, please contact night-coverage [email or web address]

## 2023-11-09 NOTE — Plan of Care (Signed)
  Problem: Education: Goal: Knowledge of General Education information will improve Description: Including pain rating scale, medication(s)/side effects and non-pharmacologic comfort measures Outcome: Progressing   Problem: Clinical Measurements: Goal: Respiratory complications will improve Outcome: Progressing   Problem: Clinical Measurements: Goal: Cardiovascular complication will be avoided Outcome: Progressing   Problem: Activity: Goal: Risk for activity intolerance will decrease Outcome: Progressing   Problem: Coping: Goal: Level of anxiety will decrease Outcome: Progressing   Problem: Elimination: Goal: Will not experience complications related to urinary retention Outcome: Progressing   Problem: Pain Managment: Goal: General experience of comfort will improve and/or be controlled Outcome: Progressing   Problem: Safety: Goal: Ability to remain free from injury will improve Outcome: Progressing

## 2023-11-10 ENCOUNTER — Inpatient Hospital Stay (HOSPITAL_COMMUNITY)

## 2023-11-10 DIAGNOSIS — I10 Essential (primary) hypertension: Secondary | ICD-10-CM | POA: Diagnosis not present

## 2023-11-10 DIAGNOSIS — G894 Chronic pain syndrome: Secondary | ICD-10-CM | POA: Diagnosis not present

## 2023-11-10 DIAGNOSIS — R571 Hypovolemic shock: Secondary | ICD-10-CM | POA: Diagnosis not present

## 2023-11-10 LAB — CBC
HCT: 34.1 % — ABNORMAL LOW (ref 39.0–52.0)
Hemoglobin: 11.7 g/dL — ABNORMAL LOW (ref 13.0–17.0)
MCH: 32.3 pg (ref 26.0–34.0)
MCHC: 34.3 g/dL (ref 30.0–36.0)
MCV: 94.2 fL (ref 80.0–100.0)
Platelets: 430 10*3/uL — ABNORMAL HIGH (ref 150–400)
RBC: 3.62 MIL/uL — ABNORMAL LOW (ref 4.22–5.81)
RDW: 15.7 % — ABNORMAL HIGH (ref 11.5–15.5)
WBC: 23.9 10*3/uL — ABNORMAL HIGH (ref 4.0–10.5)
nRBC: 0.1 % (ref 0.0–0.2)

## 2023-11-10 LAB — MAGNESIUM: Magnesium: 1.8 mg/dL (ref 1.7–2.4)

## 2023-11-10 NOTE — Plan of Care (Signed)
  Problem: Pain Managment: Goal: General experience of comfort will improve and/or be controlled Outcome: Progressing   Problem: Safety: Goal: Ability to remain free from injury will improve Outcome: Progressing   Problem: Skin Integrity: Goal: Risk for impaired skin integrity will decrease Outcome: Progressing

## 2023-11-10 NOTE — Plan of Care (Signed)
  Problem: Education: Goal: Knowledge of General Education information will improve Description: Including pain rating scale, medication(s)/side effects and non-pharmacologic comfort measures Outcome: Progressing   Problem: Clinical Measurements: Goal: Respiratory complications will improve Outcome: Progressing   Problem: Clinical Measurements: Goal: Cardiovascular complication will be avoided Outcome: Progressing   Problem: Nutrition: Goal: Adequate nutrition will be maintained Outcome: Progressing   Problem: Coping: Goal: Level of anxiety will decrease Outcome: Progressing   Problem: Elimination: Goal: Will not experience complications related to urinary retention Outcome: Progressing   Problem: Pain Managment: Goal: General experience of comfort will improve and/or be controlled Outcome: Progressing   Problem: Safety: Goal: Ability to remain free from injury will improve Outcome: Progressing   Problem: Skin Integrity: Goal: Risk for impaired skin integrity will decrease Outcome: Progressing

## 2023-11-10 NOTE — Progress Notes (Signed)
 PROGRESS NOTE    [name]  [name] DOB: [birth year] DOA: 11/02/2023 PCP: Sun, Vyvyan, MD   Brief Narrative: [name] is a 68 y.o. male with a history of gastric adenocarcinoma on neoadjuvant chemotherapy, hypertension, CAD, COPD, hepatitis C, chronic pain.  Patient presented secondary to abdominal pain with initial concern for sepsis requiring ICU admission and initiation of vasopressor support.  Patient initially given IV antibiotics for broad-spectrum coverage which were quickly discontinued.  Patient was able to wean off of vasopressor support.  During admission, patient's course was complicated by development of ileus requiring general surgery consultation and NG tube placement for decompression.  Patient was successfully managed with NG tube decompression with NG tube being removed successfully. Patient now with worsening leukocytosis.   Assessment and Plan:  Hypovolemic shock Insetting of gastroenteritis.  Patient was admitted to ICU for vasopressor support.  Patient was managed on vasopressors and IV hydration with improvement in blood pressure.  Sepsis was ruled out.  Ileus Insetting of acute infection with gastroenteritis.  Patient was evaluated by general surgery.  Patient was managed via NG tube placement on 6/21 which was successfully removed on 6/25 after return of bowel function.  Leukocytosis Unclear etiology for acute rise in white blood cell count.  Patient was initially leukopenic with acute rise of white blood cell count and no associated fevers.  Patient has no identifiable source for infection.  It appears patient has chronic leukocytosis May 2025 as an outpatient. Last chemotherapy was two weeks ago, and patient reports not receiving C-GSF medication. WBC continues to worsen. - CBC in a.m. - Chest x-ray - Blood cultures  Gastric carcinoma Noted.  Patient is on adjuvant chemotherapy as an outpatient with plan for surgery as an outpatient.  Paroxysmal  atrial fibrillation Patient developed atrial fibrillation in setting of hypotension.  Patient was managed briefly on amiodarone  and started on heparin  IV.  Echocardiogram obtained with normal LVEF and no significant valvular disease.  TSH ordered and was 1.145.  Patient was transition from heparin  IV to Eliquis  and metoprolol  started for rate control. - Continue Eliquis  - Continue metoprolol   Acute respite failure with hypoxia CTA obtained this admission was negative for acute PE.  Patient was weaned off oxygen and is currently stable on room air.  History of COPD Noted.  Patient is not on medication management as an outpatient.  Normocytic anemia Chronic since May 2025 and currently stable.  Hypokalemia Potassium down to 2.7.  Patient received potassium supplementation with improvement of potassium.  Hyponatremia Mild.  Chronic pain syndrome - Continue oxycodone  as needed   DVT prophylaxis: Eliquis  Code Status:   Code Status: Full Code Family Communication: Sister on telephone at bedside Disposition Plan: Discharge home likely in 1-2 days pending workup for leukocytosis or pending improvement of leukocytosis.   Consultants:  PCCM General Surgery  Procedures:    Antimicrobials: Vancomycin  Flagyl  Cefepime    Subjective: Some chronic pain. Otherwise, no issues.  Objective: BP 122/84 (BP Location: Right Arm)   Pulse 71   Temp 98 F (36.7 C)   Resp 19   Ht 5' 11 (1.803 m)   Wt 74.6 kg   SpO2 99%   BMI 22.93 kg/m   Examination:  General exam: Appears calm and comfortable Respiratory system: Rhonchi bilaterally. Respiratory effort normal. Cardiovascular system: S1 & S2 heard, RRR Gastrointestinal system: Abdomen is nondistended, soft and nontender.  Normal bowel sounds heard. Central nervous system: Alert and oriented. No focal neurological deficits.   Data Reviewed:  I have personally reviewed following labs and imaging studies  CBC Lab Results   Component Value Date   WBC 23.9 (H) 11/10/2023   RBC 3.62 (L) 11/10/2023   HGB 11.7 (L) 11/10/2023   HCT 34.1 (L) 11/10/2023   MCV 94.2 11/10/2023   MCH 32.3 11/10/2023   PLT 430 (H) 11/10/2023   MCHC 34.3 11/10/2023   RDW 15.7 (H) 11/10/2023   LYMPHSABS 3.1 11/07/2023   MONOABS 3.9 (H) 11/07/2023   EOSABS 0.0 11/07/2023   BASOSABS 0.1 11/07/2023     Last metabolic panel Lab Results  Component Value Date   NA 133 (L) 11/09/2023   K 3.5 11/09/2023   CL 107 11/09/2023   CO2 21 (L) 11/09/2023   BUN 10 11/09/2023   CREATININE 1.03 11/09/2023   GLUCOSE 111 (H) 11/09/2023   GFRNONAA >60 11/09/2023   GFRAA  10/16/2007    >60        The eGFR has been calculated using the MDRD equation. This calculation has not been validated in all clinical   CALCIUM  9.1 11/09/2023   PHOS 2.9 11/03/2023   PROT 5.5 (L) 11/09/2023   ALBUMIN 2.6 (L) 11/09/2023   BILITOT 0.9 11/09/2023   ALKPHOS 27 (L) 11/09/2023   AST 25 11/09/2023   ALT 26 11/09/2023   ANIONGAP 5 11/09/2023    GFR: Estimated Creatinine Clearance: 73.4 mL/min (by C-G formula based on SCr of 1.03 mg/dL).  Recent Results (from the past 240 hours)  MRSA Next Gen by PCR, Nasal     Status: None   Collection Time: 11/02/23 10:01 PM   Specimen: Nasal Mucosa; Nasal Swab  Result Value Ref Range Status   MRSA by PCR Next Gen NOT DETECTED NOT DETECTED Final    Comment: (NOTE) The GeneXpert MRSA Assay (FDA approved for NASAL specimens only), is one component of a comprehensive MRSA colonization surveillance program. It is not intended to diagnose MRSA infection nor to guide or monitor treatment for MRSA infections. Test performance is not FDA approved in patients less than 61 years old. Performed at [facility], [address] [place], [place] [address]   Gastrointestinal Panel by PCR , Stool     Status: None   Collection Time: 11/07/23  4:50 PM   Specimen: Stool  Result Value Ref Range Status   Campylobacter  species NOT DETECTED NOT DETECTED Final   Plesimonas shigelloides NOT DETECTED NOT DETECTED Final   Salmonella species NOT DETECTED NOT DETECTED Final   Yersinia enterocolitica NOT DETECTED NOT DETECTED Final   Vibrio species NOT DETECTED NOT DETECTED Final   Vibrio cholerae NOT DETECTED NOT DETECTED Final   Enteroaggregative E coli (EAEC) NOT DETECTED NOT DETECTED Final   Enteropathogenic E coli (EPEC) NOT DETECTED NOT DETECTED Final   Enterotoxigenic E coli (ETEC) NOT DETECTED NOT DETECTED Final   Shiga like toxin producing E coli (STEC) NOT DETECTED NOT DETECTED Final   Shigella/Enteroinvasive E coli (EIEC) NOT DETECTED NOT DETECTED Final   Cryptosporidium NOT DETECTED NOT DETECTED Final   Cyclospora cayetanensis NOT DETECTED NOT DETECTED Final   Entamoeba histolytica NOT DETECTED NOT DETECTED Final   Giardia lamblia NOT DETECTED NOT DETECTED Final   Adenovirus F40/41 NOT DETECTED NOT DETECTED Final   Astrovirus NOT DETECTED NOT DETECTED Final   Norovirus GI/GII NOT DETECTED NOT DETECTED Final   Rotavirus A NOT DETECTED NOT DETECTED Final   Sapovirus (I, II, IV, and V) NOT DETECTED NOT DETECTED Final    Comment: Performed  at [facility], [address] [place], [place] [address]  C Difficile Quick Screen (NO PCR Reflex)     Status: None   Collection Time: 11/07/23  4:50 PM   Specimen: Stool  Result Value Ref Range Status   C Diff antigen NEGATIVE NEGATIVE Final   C Diff toxin NEGATIVE NEGATIVE Final   C Diff interpretation No C. difficile detected.  Final    Comment: Performed at [facility], [address] [place], [place] [address]      Radiology Studies: DG Chest 2 View Result Date: 11/10/2023 CLINICAL DATA:  [id number] Rhonchi [id number] EXAM: CHEST - 2 VIEW COMPARISON:  November 02, 2023 FINDINGS: Right chest port in place, terminating in the upper SVC, unchanged. Interval removal of the esophagogastric tube. No focal airspace consolidation, pleural effusion, or  pneumothorax. No cardiomegaly.No acute fracture or destructive lesion. Multilevel thoracic osteophytosis. Partially visualized cervical fusion hardware. IMPRESSION: No acute cardiopulmonary abnormality. Electronically Signed   By: Rogelia Myers M.D.   On: 11/10/2023 11:49      LOS: 8 days    Elgin Lam, MD [facility] 11/10/2023, 12:06 PM   If 7PM-7AM, please contact night-coverage [email or web address]

## 2023-11-11 ENCOUNTER — Other Ambulatory Visit (HOSPITAL_COMMUNITY): Payer: Self-pay

## 2023-11-11 DIAGNOSIS — F325 Major depressive disorder, single episode, in full remission: Secondary | ICD-10-CM

## 2023-11-11 DIAGNOSIS — R571 Hypovolemic shock: Secondary | ICD-10-CM | POA: Diagnosis not present

## 2023-11-11 DIAGNOSIS — R5081 Fever presenting with conditions classified elsewhere: Secondary | ICD-10-CM

## 2023-11-11 DIAGNOSIS — D709 Neutropenia, unspecified: Secondary | ICD-10-CM

## 2023-11-11 DIAGNOSIS — I10 Essential (primary) hypertension: Secondary | ICD-10-CM | POA: Diagnosis not present

## 2023-11-11 DIAGNOSIS — J9601 Acute respiratory failure with hypoxia: Secondary | ICD-10-CM

## 2023-11-11 DIAGNOSIS — G894 Chronic pain syndrome: Secondary | ICD-10-CM | POA: Diagnosis not present

## 2023-11-11 DIAGNOSIS — K219 Gastro-esophageal reflux disease without esophagitis: Secondary | ICD-10-CM

## 2023-11-11 LAB — CBC WITH DIFFERENTIAL/PLATELET
Abs Immature Granulocytes: 0.71 10*3/uL — ABNORMAL HIGH (ref 0.00–0.07)
Basophils Absolute: 0.1 10*3/uL (ref 0.0–0.1)
Basophils Relative: 1 %
Eosinophils Absolute: 0 10*3/uL (ref 0.0–0.5)
Eosinophils Relative: 0 %
HCT: 31.8 % — ABNORMAL LOW (ref 39.0–52.0)
Hemoglobin: 11 g/dL — ABNORMAL LOW (ref 13.0–17.0)
Immature Granulocytes: 4 %
Lymphocytes Relative: 15 %
Lymphs Abs: 2.5 10*3/uL (ref 0.7–4.0)
MCH: 32.6 pg (ref 26.0–34.0)
MCHC: 34.6 g/dL (ref 30.0–36.0)
MCV: 94.4 fL (ref 80.0–100.0)
Monocytes Absolute: 1.6 10*3/uL — ABNORMAL HIGH (ref 0.1–1.0)
Monocytes Relative: 9 %
Neutro Abs: 12.4 10*3/uL — ABNORMAL HIGH (ref 1.7–7.7)
Neutrophils Relative %: 71 %
Platelets: 422 10*3/uL — ABNORMAL HIGH (ref 150–400)
RBC: 3.37 MIL/uL — ABNORMAL LOW (ref 4.22–5.81)
RDW: 15.8 % — ABNORMAL HIGH (ref 11.5–15.5)
WBC: 17.4 10*3/uL — ABNORMAL HIGH (ref 4.0–10.5)
nRBC: 0 % (ref 0.0–0.2)

## 2023-11-11 LAB — BASIC METABOLIC PANEL WITH GFR
Anion gap: 7 (ref 5–15)
BUN: 12 mg/dL (ref 8–23)
CO2: 20 mmol/L — ABNORMAL LOW (ref 22–32)
Calcium: 9 mg/dL (ref 8.9–10.3)
Chloride: 106 mmol/L (ref 98–111)
Creatinine, Ser: 1.03 mg/dL (ref 0.61–1.24)
GFR, Estimated: >60 mL/min (ref >60)
Glucose, Bld: 102 mg/dL — ABNORMAL HIGH (ref 70–99)
Potassium: 3.7 mmol/L (ref 3.5–5.1)
Sodium: 133 mmol/L — ABNORMAL LOW (ref 135–145)

## 2023-11-11 MED ORDER — APIXABAN 5 MG PO TABS
5.0000 mg | ORAL_TABLET | Freq: Two times a day (BID) | ORAL | 1 refills | Status: DC
  Filled 2023-11-11: qty 180, 90d supply, fill #0

## 2023-11-11 MED ORDER — HEPARIN SOD (PORK) LOCK FLUSH 100 UNIT/ML IV SOLN
500.0000 [IU] | INTRAVENOUS | Status: AC | PRN
  Administered 2023-11-11: 500 [IU]

## 2023-11-11 MED ORDER — METOPROLOL TARTRATE 50 MG PO TABS
50.0000 mg | ORAL_TABLET | Freq: Two times a day (BID) | ORAL | 1 refills | Status: DC
  Filled 2023-11-11: qty 60, 30d supply, fill #0

## 2023-11-11 NOTE — Plan of Care (Signed)

## 2023-11-11 NOTE — Progress Notes (Signed)
 DISCHARGE NOTE HOME [name] to be discharged Home per MD order. Discussed prescriptions and follow up appointments with the patient. Prescriptions given to patient; medication list explained in detail. Patient verbalized understanding.  Skin clean, dry and intact without evidence of skin break down, no evidence of skin tears noted. IV catheter discontinued intact. Site without signs and symptoms of complications. Dressing and pressure applied. Pt denies pain at the site currently. No complaints noted.  Patient free of lines, drains, and wounds.   An After Visit Summary (AVS) was printed and given to the patient. Patient escorted via wheelchair, and discharged home via private auto.  Peyton SHAUNNA Pepper, RN

## 2023-11-11 NOTE — Discharge Summary (Signed)
 Physician Discharge Summary   Patient: [name] MRN: [id number] DOB: 05/24/1955  Admit date:     11/02/2023  Discharge date: 11/11/23  Discharge Physician: Concepcion Riser   PCP: Sun, Vyvyan, MD   Recommendations at discharge:  {Tip this will not be part of the note when signed- Example include specific recommendations for outpatient follow-up, pending tests to follow-up on. (Optional):26781}  PCP follow up in 1 week. Repeat CBC in 1 week  Discharge Diagnoses: Principal Problem:   Hypovolemic shock (HCC) Active Problems:   Gastric cancer (HCC)   Chronic obstructive pulmonary disease (HCC)   Benign essential hypertension   Diabetic renal disease (HCC)   Chronic pain syndrome   Gastro-esophageal reflux disease without esophagitis   Hypercholesterolemia   Major depressive disorder with single episode, in full remission (HCC)   Nicotine dependence, cigarettes, uncomplicated   Bowel obstruction (HCC)   Neutropenia with fever (HCC)   Hypoxic respiratory failure (HCC)  Resolved Problems:   Opiate abuse, continuous (HCC)   Overactive bladder  Hospital Course: [name] is a 68 y.o. male with a history of gastric adenocarcinoma on neoadjuvant chemotherapy, hypertension, CAD, COPD, hepatitis C, chronic pain.  Patient presented secondary to abdominal pain with initial concern for sepsis requiring ICU admission and initiation of vasopressor support.  Patient initially given IV antibiotics for broad-spectrum coverage which were quickly discontinued.  Patient was able to wean off of vasopressor support.  During admission, patient's course was complicated by development of ileus requiring general surgery consultation and NG tube placement for decompression.  Patient was successfully managed with NG tube decompression with NG tube being removed successfully. Leukocytosis improving. New scripts for Eliquis  sent to pharmacy. Advised PCP, oncology follow up along repeat CBC in 1 week. He  understands and agrees.     Assessment and Plan: Hypovolemic shock Insetting of gastroenteritis.  Patient was admitted to ICU for vasopressor support.  Patient was managed on vasopressors and IV hydration with improvement in blood pressure.  Sepsis was ruled out.   Ileus Insetting of acute infection with gastroenteritis.  Patient was evaluated by general surgery.  Patient was managed via NG tube placement on 6/21 which was successfully removed on 6/25 after return of bowel function.   Leukocytosis Unclear etiology for acute rise in white blood cell count.  Patient was initially leukopenic with acute rise of white blood cell count and no associated fevers.  Patient has no identifiable source for infection.  It appears patient has chronic leukocytosis May 2025 as an outpatient. Last chemotherapy was two weeks ago, and patient reports not receiving C-GSF medication. WBC improving. Advised to follow up with oncology as scheduled.   Gastric carcinoma Noted.  Patient is on adjuvant chemotherapy as an outpatient with plan for surgery as an outpatient.   Paroxysmal atrial fibrillation Patient developed atrial fibrillation in setting of hypotension. Patient was managed briefly on amiodarone  and started on heparin  IV.  Echocardiogram obtained with normal LVEF and no significant valvular disease.  TSH ordered and was 1.145.  Patient was transitioned from heparin  IV to Eliquis  and metoprolol  50mg  BID started for rate control. New scripts sent to pharmacy.   Acute respiratory failure with hypoxia CTA obtained this admission was negative for acute PE. Patient was weaned off oxygen and is currently stable on room air.   History of COPD Noted.  Patient is not on medication management as an outpatient.   Normocytic anemia Chronic since May 2025 and currently stable.   Hypokalemia Patient received potassium  supplementation with improvement of potassium. Continue home potassium supplements.    Hyponatremia Mild. Hold hydrochlorothiazide.  Hypertension: Continue amlodipine . Metoprolol . Hold hydrochlorothiazide, Losartan.   Chronic pain syndrome Continue oxycodone  as needed    {Tip this will not be part of the note when signed Body mass index is 22.29 kg/m. , ,  (Optional):26781}  {(NOTE) Pain control PDMP Statment (Optional):26782} Consultants: PCCM Procedures performed: none  Disposition: Home Diet recommendation:  Discharge Diet Orders (From admission, onward)     Start     Ordered   11/11/23 0000  Diet - low sodium heart healthy        11/11/23 1301   11/11/23 0000  Diet Carb Modified        11/11/23 1301           Cardiac and Carb modified diet DISCHARGE MEDICATION: Allergies as of 11/11/2023       Reactions   Dilaudid  [hydromorphone ] Itching   Lisinopril Nausea Only        Medication List     STOP taking these medications    GOODY HEADACHE PO   hydrochlorothiazide 25 MG tablet Commonly known as: HYDRODIURIL   losartan 100 MG tablet Commonly known as: COZAAR       TAKE these medications    albuterol  108 (90 Base) MCG/ACT inhaler Commonly known as: VENTOLIN  HFA Inhale 1-2 puffs into the lungs every 6 (six) hours as needed for shortness of breath or wheezing.   amLODipine  5 MG tablet Commonly known as: NORVASC  Take 5 mg by mouth daily.   apixaban  5 MG Tabs tablet Commonly known as: ELIQUIS  Take 1 tablet (5 mg total) by mouth 2 (two) times daily.   Buprenorphine  HCl 900 MCG Film Take 900 mcg by mouth in the morning and at bedtime.   buPROPion  150 MG 24 hr tablet Commonly known as: WELLBUTRIN  XL Take 150 mg by mouth every morning.   dexamethasone  4 MG tablet Commonly known as: DECADRON  Take 2 tablets (8 mg total) by mouth daily. Start the day after chemotherapy for 2 days. Take with food.   diphenoxylate -atropine  2.5-0.025 MG tablet Commonly known as: LOMOTIL  Take 1 tablet by mouth 4 (four) times daily as needed for  diarrhea or loose stools.   lidocaine -prilocaine  cream Commonly known as: EMLA  Apply to affected area once What changed:  how much to take how to take this when to take this additional instructions   Linzess  145 MCG Caps capsule Generic drug: linaclotide  Take 145 mcg by mouth at bedtime.   loperamide  2 MG capsule Commonly known as: IMODIUM  Take 2 mg by mouth as needed for diarrhea or loose stools.   metFORMIN 500 MG 24 hr tablet Commonly known as: GLUCOPHAGE-XR Take 500 mg by mouth every evening.   methocarbamol 750 MG tablet Commonly known as: ROBAXIN Take 750 mg by mouth at bedtime.   metoprolol  tartrate 50 MG tablet Commonly known as: LOPRESSOR  Take 1 tablet (50 mg total) by mouth 2 (two) times daily. What changed:  medication strength how much to take when to take this   Narcan 4 MG/0.1ML Liqd nasal spray kit Generic drug: naloxone Place 1 spray into the nose as needed (opioid overdose).   ondansetron  8 MG disintegrating tablet Commonly known as: ZOFRAN -ODT Take 1 tablet (8 mg total) by mouth every 8 (eight) hours as needed for nausea or vomiting.   oxyCODONE -acetaminophen  10-325 MG tablet Commonly known as: PERCOCET Take 1 tablet by mouth every 4 (four) hours as needed for pain.  pantoprazole  40 MG tablet Commonly known as: PROTONIX  Take 1 tablet (40 mg total) by mouth every morning. 1/2 to 1 hour prior to meal   potassium chloride  SA 20 MEQ tablet Commonly known as: KLOR-CON  M Take 1 tablet (20 mEq total) by mouth daily for 7 days.   pregabalin  25 MG capsule Commonly known as: LYRICA  Take 25 mg by mouth at bedtime.   prochlorperazine  10 MG tablet Commonly known as: COMPAZINE  Take 1 tablet (10 mg total) by mouth every 6 (six) hours as needed for nausea or vomiting.   rosuvastatin  20 MG tablet Commonly known as: CRESTOR  Take 20 mg by mouth daily.   Vitamin D (Ergocalciferol) 1.25 MG (50000 UNIT) Caps capsule Commonly known as: DRISDOL Take  50,000 Units by mouth every 7 (seven) days.        Follow-up Information     [facility]  Follow up.   Why: [facility] will provide home health services.  They will call you in the next 24-48 hours to start services. Contact information: [address] [place] [address] [phone]                Discharge Exam: Filed Weights   11/09/23 0500 11/10/23 0500 11/11/23 0500  Weight: 74.6 kg 74.6 kg 72.5 kg   ***  Condition at discharge: stable  The results of significant diagnostics from this hospitalization (including imaging, microbiology, ancillary and laboratory) are listed below for reference.   Imaging Studies: DG Chest 2 View Result Date: 11/10/2023 CLINICAL DATA:  [id number] Rhonchi [id number] EXAM: CHEST - 2 VIEW COMPARISON:  November 02, 2023 FINDINGS: Right chest port in place, terminating in the upper SVC, unchanged. Interval removal of the esophagogastric tube. No focal airspace consolidation, pleural effusion, or pneumothorax. No cardiomegaly.No acute fracture or destructive lesion. Multilevel thoracic osteophytosis. Partially visualized cervical fusion hardware. IMPRESSION: No acute cardiopulmonary abnormality. Electronically Signed   By: Rogelia Myers M.D.   On: 11/10/2023 11:49   DG Abd Portable 1V Result Date: 11/04/2023 CLINICAL DATA:  Mall bowel obstruction. EXAM: PORTABLE ABDOMEN - 1 VIEW COMPARISON:  11/03/2023 and CT abdomen pelvis 11/03/2023. FINDINGS: Nasogastric tube appears to terminate in the distal esophagus. Gaseous distention of small bowel and colon. Large amount of gas in the rectum. There is retained oral contrast within the bowel. IMPRESSION: 1. Persistent ileus. 2. Nasogastric tube appears to terminate in the distal esophagus. Consider chest radiograph to better assess placement. Electronically Signed   By: Newell Eke M.D.   On: 11/04/2023 12:48   ECHOCARDIOGRAM COMPLETE Result Date: 11/03/2023     ECHOCARDIOGRAM REPORT   Patient Name:   [name] Date of Exam: 11/03/2023 Medical Rec #:  [id number]    Height:       71.0 in Accession #:    [id number]   Weight:       186.1 lb Date of Birth:  1955/05/18    BSA:          2.045 m Patient Age:    68 years     BP:           93/65 mmHg Patient Gender: M            HR:           145 bpm. Exam Location:  Inpatient Procedure: 2D Echo, Cardiac Doppler and Color Doppler (Both Spectral and Color            Flow Doppler were utilized during procedure).  Indications:    Elevated brain natriuretic peptide (BNP) level  History:        Patient has no prior history of Echocardiogram examinations.                 Risk Factors:Hypertension.  Sonographer:    Jayson Gaskins Referring Phys: [phone] LAURA R GLEASON IMPRESSIONS  1. Left ventricular ejection fraction, by estimation, is >75%. The left ventricle has hyperdynamic function. Indeterminate diastolic filling due to E-A fusion.  2. Right ventricular systolic function is normal. The right ventricular size is normal.  3. The mitral valve is grossly normal. No evidence of mitral valve regurgitation.  4. The aortic valve is tricuspid. Aortic valve regurgitation is not visualized. Comparison(s): No prior Echocardiogram. FINDINGS  Left Ventricle: Left ventricular ejection fraction, by estimation, is >75%. The left ventricle has hyperdynamic function. The left ventricular internal cavity size was normal in size. There is no left ventricular hypertrophy. Indeterminate diastolic filling due to E-A fusion. Right Ventricle: The right ventricular size is normal. No increase in right ventricular wall thickness. Right ventricular systolic function is normal. Left Atrium: Left atrial size was normal in size. Right Atrium: Right atrial size was normal in size. Pericardium: There is no evidence of pericardial effusion. Mitral Valve: The mitral valve is grossly normal. No evidence of mitral valve regurgitation. Tricuspid Valve: The tricuspid valve  is not well visualized. Tricuspid valve regurgitation is not demonstrated. Aortic Valve: The aortic valve is tricuspid. Aortic valve regurgitation is not visualized. Aortic valve mean gradient measures 2.0 mmHg. Aortic valve peak gradient measures 3.2 mmHg. Aortic valve area, by VTI measures 3.74 cm. Pulmonic Valve: The pulmonic valve was not well visualized. Pulmonic valve regurgitation is not visualized. Aorta: The aortic root and ascending aorta are structurally normal, with no evidence of dilitation. Venous: The inferior vena cava was not well visualized. IAS/Shunts: The interatrial septum was not assessed.  LEFT VENTRICLE PLAX 2D LVIDd:         4.40 cm LVIDs:         2.40 cm LV PW:         0.90 cm LV IVS:        1.00 cm LVOT diam:     2.00 cm LV SV:         41 LV SV Index:   20 LVOT Area:     3.14 cm  RIGHT VENTRICLE RV S prime:     13.40 cm/s TAPSE (M-mode): 1.7 cm LEFT ATRIUM             Index LA Vol (A2C):   55.8 ml 27.29 ml/m LA Vol (A4C):   33.5 ml 16.38 ml/m LA Biplane Vol: 46.6 ml 22.79 ml/m  AORTIC VALVE AV Area (Vmax):    2.48 cm AV Area (Vmean):   3.08 cm AV Area (VTI):     3.74 cm AV Vmax:           89.90 cm/s AV Vmean:          62.200 cm/s AV VTI:            0.111 m AV Peak Grad:      3.2 mmHg AV Mean Grad:      2.0 mmHg LVOT Vmax:         71.10 cm/s LVOT Vmean:        60.900 cm/s LVOT VTI:          0.132 m LVOT/AV VTI ratio: 1.19  AORTA Ao Root diam: 3.70  cm MITRAL VALVE MV Area (PHT): 4.63 cm    SHUNTS MV Decel Time: 164 msec    Systemic VTI:  0.13 m MV E velocity: 70.70 cm/s  Systemic Diam: 2.00 cm Vinie Maxcy MD Electronically signed by Vinie Maxcy MD Signature Date/Time: 11/03/2023/3:34:05 PM    Final    DG Abd 1 View Result Date: 11/03/2023 CLINICAL DATA:  Nasogastric tube present. EXAM: ABDOMEN - 1 VIEW COMPARISON:  CT earlier today FINDINGS: Tip of the enteric tube is below the diaphragm in the stomach, the side port is in the distal esophagus. Gaseous gastric distension.  Dilated bowel in the right abdomen is partially included in the field of view. IMPRESSION: Tip of the enteric tube below the diaphragm in the stomach, the side port is in the distal esophagus. Recommend advancement of approximately 7.5 cm to place the side-port below the diaphragm. Electronically Signed   By: Andrea Gasman M.D.   On: 11/03/2023 13:41   CT ABDOMEN PELVIS WO CONTRAST Result Date: 11/03/2023 CLINICAL DATA:  Small-bowel obstruction. Suspected free air on same day abdominal radiograph EXAM: CT ABDOMEN AND PELVIS WITHOUT CONTRAST TECHNIQUE: Multidetector CT imaging of the abdomen and pelvis was performed following the standard protocol without IV contrast. RADIATION DOSE REDUCTION: This exam was performed according to the departmental dose-optimization program which includes automated exposure control, adjustment of the mA and/or kV according to patient size and/or use of iterative reconstruction technique. COMPARISON:  Same day abdominal radiograph, CT abdomen and pelvis dated 11/03/2023 FINDINGS: Lower chest: No focal consolidation or pulmonary nodule in the lung bases. No pleural effusion or pneumothorax demonstrated. Partially imaged heart size is normal. Coronary artery calcifications. Hepatobiliary: No focal hepatic lesions. No intra or extrahepatic biliary ductal dilation. Normal gallbladder. Pancreas: No focal lesions or main ductal dilation. Spleen: Normal in size without focal abnormality. Adrenals/Urinary Tract: No adrenal nodules. Right renal simple cysts. Subcentimeter left interpolar hypodensity (3:37), too small to characterize but also likely a cyst. No hydronephrosis. Excreted contrast material within the urinary bladder. No abnormal filling defects. Stomach/Bowel: Enteric tube tip terminates in the gastric body. Normal appearance of the stomach. Intraluminal enteric contrast material is seen throughout the small bowel to the level of the terminal ileum. There is persistent small  bowel dilation in the proximal small bowel with gradual luminal narrowing. The colon is diffusely underdistended. Dilute enteric contrast material remains within the rectosigmoid colon. Colonic diverticulosis without acute diverticulitis. Normal appendix. Vascular/Lymphatic: Aortic atherosclerosis. Unchanged right common iliac artery aneurysm measuring 19 mm. No enlarged abdominal or pelvic lymph nodes. Reproductive: Prostate is unremarkable. Other: No free fluid, fluid collection, or free air. Musculoskeletal: No acute or abnormal lytic or blastic osseous lesions. Multilevel degenerative changes of the partially imaged thoracic and lumbar spine. IMPRESSION: 1. No evidence of free air. 2. Persistent small bowel dilation in the proximal small bowel with gradual luminal narrowing, likely due to ileus. 3. Colonic diverticulosis without acute diverticulitis. 4. Unchanged right common iliac artery aneurysm measuring 19 mm. 5.  Aortic Atherosclerosis (ICD10-I70.0). Electronically Signed   By: Limin  Xu M.D.   On: 11/03/2023 09:47   DG Abd Portable 1V-Small Bowel Obstruction Protocol-initial, 8 hr delay Result Date: 11/03/2023 EXAM: 1 VIEW XRAY OF THE ABDOMEN SUPINE 11/03/2023 06:03:00 AM COMPARISON: CT of the abdomen and pelvis 11/03/2023 at 01:30 AM. CLINICAL HISTORY: Small bowel obstruction 8 hr delay. FINDINGS: BOWEL: Oral contrast has progressed some into dilated proximal small bowel. Majority of contrast remains in the stomach. PERITONEUM AND SOFT TISSUES:  Free air is present. BONES: No acute osseous abnormality. IMPRESSION: 1. Small bowel obstruction with some progression of oral contrast into dilated proximal small bowel The majority of contrast remains in the stomach. 2. Free air. Electronically signed by: Lonni Necessary MD 11/03/2023 06:22 AM EDT RP Workstation: [id number]   CT Angio Chest Pulmonary Embolism (PE) W or WO Contrast Result Date: 11/03/2023 CLINICAL DATA:  Pulmonary embolism (PE)  suspected, high prob; Bowel obstruction suspected. Gastric cancer. * Tracking Code: BO * EXAM: CT ANGIOGRAPHY CHEST CT ABDOMEN AND PELVIS WITH CONTRAST TECHNIQUE: Multidetector CT imaging of the chest was performed using the standard protocol during bolus administration of intravenous contrast. Multiplanar CT image reconstructions and MIPs were obtained to evaluate the vascular anatomy. Multidetector CT imaging of the abdomen and pelvis was performed using the standard protocol during bolus administration of intravenous contrast. RADIATION DOSE REDUCTION: This exam was performed according to the departmental dose-optimization program which includes automated exposure control, adjustment of the mA and/or kV according to patient size and/or use of iterative reconstruction technique. CONTRAST:  60mL OMNIPAQUE  IOHEXOL  350 MG/ML SOLN COMPARISON:  11/02/2023 FINDINGS: CTA CHEST FINDINGS Cardiovascular: There is adequate opacification of the pulmonary arterial tree through the segmental level and there is no intraluminal filling defect identified to suggest acute pulmonary embolism. Central pulmonary arteries are of normal caliber. Extensive multi-vessel coronary artery calcification. Global cardiac size within normal limits. No pericardial effusion. There is fusiform dilation of the thoracic aorta measuring 4.0 cm in diameter in its ascending segment and 3.1 cm in diameter in its proximal descending segment. Moderate superimposed atherosclerotic calcification. Right subclavian chest port tip seen within the superior vena cava. Mediastinum/Nodes: Nasogastric tube tip seen within the proximal body of the stomach. Known gastric cardia mass is not well appreciated on this examination. Esophagus otherwise unremarkable. Visualized thyroid  is unremarkable. No pathologic thoracic adenopathy. Lungs/Pleura: Mild emphysema. Lungs otherwise clear. No pneumothorax or pleural effusion. Musculoskeletal: Multiple healed right rib  fractures and right scapular fracture. No acute bone abnormality. No lytic or blastic bone lesion. Review of the MIP images confirms the above findings. CT ABDOMEN and PELVIS FINDINGS Hepatobiliary: No focal liver abnormality is seen. No gallstones, gallbladder wall thickening, or biliary dilatation. Pancreas: Unremarkable Spleen: Unremarkable Adrenals/Urinary Tract: Adrenal glands are unremarkable. Simple cortical cysts are seen within the right kidney for which no follow-up imaging is recommended. The kidneys are otherwise unremarkable. Bladder unremarkable. Stomach/Bowel: Since the prior examination, the degree of gastric and proximal small bowel dilation has improved, though has not yet completely resolved. Exact point of transition is not clearly identified, however, dilated small bowel extends into the right upper quadrant, anterior and superior to the hepatic flexure of the colon, where there is transition to more normal caliber bowel suggesting a potential internal hernia in this location. The mid and distal small bowel as well as the large bowel are decompressed. No free intraperitoneal gas or fluid. Mild descending colonic diverticulosis again noted. Appendix unremarkable. Vascular/Lymphatic: Extensive aortoiliac atherosclerotic calcification. 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 Stable shotty retroperitoneal adenopathy nonspecific. No frankly pathologic adenopathy within the abdomen and pelvis. Reproductive: Prostate is unremarkable. Other: No abdominal wall hernia or abnormality. No abdominopelvic ascites. Musculoskeletal: No acute bone abnormality. No lytic or blastic bone lesion. Osseous structures are age appropriate. Review of the MIP images confirms the above findings. IMPRESSION: 1. No acute pulmonary embolism. No acute intrathoracic pathology identified. 2. Extensive multi-vessel coronary artery calcification. 3. Fusiform dilation of the thoracic aorta measuring  up to 4.0  cm in its ascending segment. Recommend annual imaging followup by CTA or MRA. This recommendation follows 2010 ACCF/AHA/AATS/ACR/ASA/SCA/SCAI/SIR/STS/SVM Guidelines for the Diagnosis and Management of Patients with Thoracic Aortic Disease. Circulation. 2010; 121: Z733-z630. Aortic aneurysm NOS (ICD10-I71.9) 4. Interval nasogastric intubation. Interval improvement in gastric and proximal small bowel dilation, though not yet completely resolved. Exact point of transition is not clearly identified, however, dilated small bowel extends into the right upper quadrant, anterior and superior to the hepatic flexure of the colon, where there is transition to more normal caliber bowel suggesting a potential internal hernia in this location. 5. Mild descending colonic diverticulosis. 6. 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 Aortic Atherosclerosis (ICD10-I70.0). Electronically Signed   By: Dorethia Molt M.D.   On: 11/03/2023 02:17   CT ABDOMEN PELVIS W CONTRAST Result Date: 11/03/2023 CLINICAL DATA:  Pulmonary embolism (PE) suspected, high prob; Bowel obstruction suspected. Gastric cancer. * Tracking Code: BO * EXAM: CT ANGIOGRAPHY CHEST CT ABDOMEN AND PELVIS WITH CONTRAST TECHNIQUE: Multidetector CT imaging of the chest was performed using the standard protocol during bolus administration of intravenous contrast. Multiplanar CT image reconstructions and MIPs were obtained to evaluate the vascular anatomy. Multidetector CT imaging of the abdomen and pelvis was performed using the standard protocol during bolus administration of intravenous contrast. RADIATION DOSE REDUCTION: This exam was performed according to the departmental dose-optimization program which includes automated exposure control, adjustment of the mA and/or kV according to patient size and/or use of iterative reconstruction technique. CONTRAST:  60mL OMNIPAQUE  IOHEXOL  350 MG/ML SOLN COMPARISON:  11/02/2023 FINDINGS: CTA CHEST  FINDINGS Cardiovascular: There is adequate opacification of the pulmonary arterial tree through the segmental level and there is no intraluminal filling defect identified to suggest acute pulmonary embolism. Central pulmonary arteries are of normal caliber. Extensive multi-vessel coronary artery calcification. Global cardiac size within normal limits. No pericardial effusion. There is fusiform dilation of the thoracic aorta measuring 4.0 cm in diameter in its ascending segment and 3.1 cm in diameter in its proximal descending segment. Moderate superimposed atherosclerotic calcification. Right subclavian chest port tip seen within the superior vena cava. Mediastinum/Nodes: Nasogastric tube tip seen within the proximal body of the stomach. Known gastric cardia mass is not well appreciated on this examination. Esophagus otherwise unremarkable. Visualized thyroid  is unremarkable. No pathologic thoracic adenopathy. Lungs/Pleura: Mild emphysema. Lungs otherwise clear. No pneumothorax or pleural effusion. Musculoskeletal: Multiple healed right rib fractures and right scapular fracture. No acute bone abnormality. No lytic or blastic bone lesion. Review of the MIP images confirms the above findings. CT ABDOMEN and PELVIS FINDINGS Hepatobiliary: No focal liver abnormality is seen. No gallstones, gallbladder wall thickening, or biliary dilatation. Pancreas: Unremarkable Spleen: Unremarkable Adrenals/Urinary Tract: Adrenal glands are unremarkable. Simple cortical cysts are seen within the right kidney for which no follow-up imaging is recommended. The kidneys are otherwise unremarkable. Bladder unremarkable. Stomach/Bowel: Since the prior examination, the degree of gastric and proximal small bowel dilation has improved, though has not yet completely resolved. Exact point of transition is not clearly identified, however, dilated small bowel extends into the right upper quadrant, anterior and superior to the hepatic flexure of  the colon, where there is transition to more normal caliber bowel suggesting a potential internal hernia in this location. The mid and distal small bowel as well as the large bowel are decompressed. No free intraperitoneal gas or fluid. Mild descending colonic diverticulosis again noted. Appendix unremarkable. Vascular/Lymphatic: Extensive aortoiliac atherosclerotic calcification. 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 Stable shotty retroperitoneal adenopathy nonspecific. No frankly pathologic adenopathy within the abdomen and pelvis. Reproductive: Prostate is unremarkable. Other: No abdominal wall hernia or abnormality. No abdominopelvic ascites. Musculoskeletal: No acute bone abnormality. No lytic or blastic bone lesion. Osseous structures are age appropriate. Review of the MIP images confirms the above findings. IMPRESSION: 1. No acute pulmonary embolism. No acute intrathoracic pathology identified. 2. Extensive multi-vessel coronary artery calcification. 3. Fusiform dilation of the thoracic aorta measuring up to 4.0 cm in its ascending segment. Recommend annual imaging followup by CTA or MRA. This recommendation follows 2010 ACCF/AHA/AATS/ACR/ASA/SCA/SCAI/SIR/STS/SVM Guidelines for the Diagnosis and Management of Patients with Thoracic Aortic Disease. Circulation. 2010; 121: Z733-z630. Aortic aneurysm NOS (ICD10-I71.9) 4. Interval nasogastric intubation. Interval improvement in gastric and proximal small bowel dilation, though not yet completely resolved. Exact point of transition is not clearly identified, however, dilated small bowel extends into the right upper quadrant, anterior and superior to the hepatic flexure of the colon, where there is transition to more normal caliber bowel suggesting a potential internal hernia in this location. 5. Mild descending colonic diverticulosis. 6. 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 Aortic Atherosclerosis  (ICD10-I70.0). Electronically Signed   By: Dorethia Molt M.D.   On: 11/03/2023 02:17   DG CHEST PORT 1 VIEW Result Date: 11/02/2023 CLINICAL DATA:  Hypoxia EXAM: PORTABLE CHEST 1 VIEW COMPARISON:  Chest x-ray 11/02/2023 FINDINGS: Enteric tube tip is in the body of the stomach. Right-sided chest port catheter tip projects over the SVC. There is a small right pleural effusion with minimal patchy opacities in the right lung base. The cardiomediastinal silhouette is within normal limits. No pneumothorax. No acute fracture. IMPRESSION: Small right pleural effusion with minimal patchy opacities in the right lung base, likely atelectasis. Electronically Signed   By: Greig Pique M.D.   On: 11/02/2023 23:00   DG Abd Portable 1V-Small Bowel Protocol-Position Verification Result Date: 11/02/2023 CLINICAL DATA:  Tube placement. EXAM: PORTABLE ABDOMEN - 1 VIEW COMPARISON:  6:10 p.m. FINDINGS: Imaged including the upper abdomen and lower chest demonstrates an NG tube. The tip is superimposed with left upper quadrant, below the diaphragm. IMPRESSION: N/OGT in place. Electronically Signed   By: Fonda Field M.D.   On: 11/02/2023 21:46    Microbiology: Results for orders placed or performed during the hospital encounter of 11/02/23  MRSA Next Gen by PCR, Nasal     Status: None   Collection Time: 11/02/23 10:01 PM   Specimen: Nasal Mucosa; Nasal Swab  Result Value Ref Range Status   MRSA by PCR Next Gen NOT DETECTED NOT DETECTED Final    Comment: (NOTE) The GeneXpert MRSA Assay (FDA approved for NASAL specimens only), is one component of a comprehensive MRSA colonization surveillance program. It is not intended to diagnose MRSA infection nor to guide or monitor treatment for MRSA infections. Test performance is not FDA approved in patients less than 15 years old. Performed at [facility], [address], [place], [place] [address]   Gastrointestinal Panel by PCR , Stool     Status: None    Collection Time: 11/07/23  4:50 PM   Specimen: Stool  Result Value Ref Range Status   Campylobacter species NOT DETECTED NOT DETECTED Final   Plesimonas shigelloides NOT DETECTED NOT DETECTED Final   Salmonella species NOT DETECTED NOT DETECTED Final   Yersinia enterocolitica NOT DETECTED NOT DETECTED Final   Vibrio species NOT DETECTED NOT  DETECTED Final   Vibrio cholerae NOT DETECTED NOT DETECTED Final   Enteroaggregative E coli (EAEC) NOT DETECTED NOT DETECTED Final   Enteropathogenic E coli (EPEC) NOT DETECTED NOT DETECTED Final   Enterotoxigenic E coli (ETEC) NOT DETECTED NOT DETECTED Final   Shiga like toxin producing E coli (STEC) NOT DETECTED NOT DETECTED Final   Shigella/Enteroinvasive E coli (EIEC) NOT DETECTED NOT DETECTED Final   Cryptosporidium NOT DETECTED NOT DETECTED Final   Cyclospora cayetanensis NOT DETECTED NOT DETECTED Final   Entamoeba histolytica NOT DETECTED NOT DETECTED Final   Giardia lamblia NOT DETECTED NOT DETECTED Final   Adenovirus F40/41 NOT DETECTED NOT DETECTED Final   Astrovirus NOT DETECTED NOT DETECTED Final   Norovirus GI/GII NOT DETECTED NOT DETECTED Final   Rotavirus A NOT DETECTED NOT DETECTED Final   Sapovirus (I, II, IV, and V) NOT DETECTED NOT DETECTED Final    Comment: Performed at [facility], [address] [place], [place] [address]  C Difficile Quick Screen (NO PCR Reflex)     Status: None   Collection Time: 11/07/23  4:50 PM   Specimen: Stool  Result Value Ref Range Status   C Diff antigen NEGATIVE NEGATIVE Final   C Diff toxin NEGATIVE NEGATIVE Final   C Diff interpretation No C. difficile detected.  Final    Comment: Performed at [facility], [address], [place], [place] [address]  Culture, blood (Routine X 2) w Reflex to ID Panel     Status: None (Preliminary result)   Collection Time: 11/10/23 11:46 AM   Specimen: BLOOD RIGHT ARM  Result Value Ref Range Status   Specimen Description BLOOD RIGHT ARM  Final    Special Requests   Final    BOTTLES DRAWN AEROBIC AND ANAEROBIC Blood Culture results may not be optimal due to an inadequate volume of blood received in culture bottles   Culture   Final    NO GROWTH < 24 HOURS Performed at [facility], [address], [place], [place] [address]    Report Status PENDING  Incomplete  Culture, blood (Routine X 2) w Reflex to ID Panel     Status: None (Preliminary result)   Collection Time: 11/10/23 11:48 AM   Specimen: BLOOD RIGHT HAND  Result Value Ref Range Status   Specimen Description BLOOD RIGHT HAND  Final   Special Requests   Final    BOTTLES DRAWN AEROBIC AND ANAEROBIC Blood Culture results may not be optimal due to an inadequate volume of blood received in culture bottles   Culture   Final    NO GROWTH < 24 HOURS Performed at [facility], [address], [place], [place] [address]    Report Status PENDING  Incomplete    Labs: CBC: Recent Labs  Lab 11/06/23 0434 11/07/23 0434 11/09/23 0025 11/10/23 0547 11/11/23 0456  WBC 13.1* 23.6* 21.6* 23.9* 17.4*  NEUTROABS  --  11.2*  --   --  12.4*  HGB 11.8* 11.4* 11.3* 11.7* 11.0*  HCT 33.5* 33.4* 32.5* 34.1* 31.8*  MCV 92.0 93.6 92.9 94.2 94.4  PLT 210 268 366 430* 422*   Basic Metabolic Panel: Recent Labs  Lab 11/06/23 0434 11/07/23 0434 11/09/23 0025 11/10/23 1146 11/11/23 0456  NA 131* 133* 133*  --  133*  K 2.7* 3.7 3.5  --  3.7  CL 105 110 107  --  106  CO2 17* 19* 21*  --  20*  GLUCOSE 94  127* 111*  --  102*  BUN 15 12 10   --  12  CREATININE 1.13 1.12 1.03  --  1.03  CALCIUM  8.7* 8.8* 9.1  --  9.0  MG 1.8 1.6* 1.6* 1.8  --    Liver Function Tests: Recent Labs  Lab 11/09/23 0025  AST 25  ALT 26  ALKPHOS 27*  BILITOT 0.9  PROT 5.5*  ALBUMIN 2.6*   CBG: No results for input(s): GLUCAP in the last 168 hours.  Discharge time spent: 35 minutes.  Signed: Concepcion Riser, MD [facility] 11/11/2023

## 2023-11-13 LAB — OVA + PARASITE EXAM

## 2023-11-13 LAB — O&P RESULT

## 2023-11-15 LAB — CULTURE, BLOOD (ROUTINE X 2)
Culture: NO GROWTH
Culture: NO GROWTH

## 2023-11-21 ENCOUNTER — Other Ambulatory Visit

## 2023-11-22 ENCOUNTER — Other Ambulatory Visit: Payer: Self-pay

## 2023-11-22 ENCOUNTER — Ambulatory Visit
Attending: Thoracic Surgery (Cardiothoracic Vascular Surgery) | Admitting: Thoracic Surgery (Cardiothoracic Vascular Surgery)

## 2023-11-22 DIAGNOSIS — C160 Malignant neoplasm of cardia: Secondary | ICD-10-CM

## 2023-11-22 NOTE — Progress Notes (Signed)
     [address]       [name] [address]             [phone]       Patient: Home Provider: Office Consent for Telemedicine visit obtained.  Today's visit was completed via a real-time telehealth (see specific modality noted below). The patient/authorized person provided oral consent at the time of the visit to engage in a telemedicine encounter with the present provider at [facility]. The patient/authorized person was informed of the potential benefits, limitations, and risks of telemedicine. The patient/authorized person expressed understanding that the laws that protect confidentiality also apply to telemedicine. The patient/authorized person acknowledged understanding that telemedicine does not provide emergency services and that he or she would need to call 911 or proceed to the nearest hospital for help if such a need arose.   Total time spent in the clinical discussion 10 minutes.  Telehealth Modality: Phone visit (audio only)  I had a telephone visit with Jlen Wintle He was recently was admitted to the hospital, but after an 8-day stay has returned home.  He continues to gain some weight.  He is tolerating a diet.  He is scheduled to meet with surgical oncology this coming Monday to discuss further plans.  I will be available to assist with any esophageal anastomosis.  Forrest Demuro MALVA Rayas

## 2023-11-23 ENCOUNTER — Other Ambulatory Visit: Payer: Self-pay

## 2023-11-24 NOTE — Assessment & Plan Note (Signed)
-  cT2N0M0, stage I, MMR normal  - He presented with heartburn.  EGD showed medium size infiltrative, fungating and frond-like villous, partially circumferential mass in the cardia, biopsy showed at least intramucosal adenocarcinoma. -He underwent EUS on August 28, 2023, which showed T2 lesion, with possible tumor invading GE junction. -He had exploratory laparoscope on August 29, 2023, which was negative for peritoneal metastasis, he had a port placement. -I recommend neoadjuvant chemotherapy FLOT every 2 weeks for 4 cycles, followed by surgery. He started on 09/12/2023 -He has met general surgeon Dr. Leighton Punches, and is also scheduled to see thoracic surgeon Dr. Deloise Ferries also.

## 2023-11-25 ENCOUNTER — Inpatient Hospital Stay

## 2023-11-25 ENCOUNTER — Inpatient Hospital Stay: Attending: Hematology

## 2023-11-25 ENCOUNTER — Telehealth: Payer: Self-pay

## 2023-11-25 ENCOUNTER — Other Ambulatory Visit

## 2023-11-25 ENCOUNTER — Other Ambulatory Visit (HOSPITAL_COMMUNITY): Payer: Self-pay

## 2023-11-25 ENCOUNTER — Inpatient Hospital Stay (HOSPITAL_BASED_OUTPATIENT_CLINIC_OR_DEPARTMENT_OTHER): Admitting: Hematology

## 2023-11-25 VITALS — BP 128/70 | HR 69 | Temp 97.9°F | Resp 14 | Ht 71.0 in | Wt 184.3 lb

## 2023-11-25 DIAGNOSIS — C160 Malignant neoplasm of cardia: Secondary | ICD-10-CM

## 2023-11-25 DIAGNOSIS — T451X5A Adverse effect of antineoplastic and immunosuppressive drugs, initial encounter: Secondary | ICD-10-CM | POA: Diagnosis not present

## 2023-11-25 DIAGNOSIS — R112 Nausea with vomiting, unspecified: Secondary | ICD-10-CM | POA: Insufficient documentation

## 2023-11-25 DIAGNOSIS — Z809 Family history of malignant neoplasm, unspecified: Secondary | ICD-10-CM | POA: Insufficient documentation

## 2023-11-25 DIAGNOSIS — Z95828 Presence of other vascular implants and grafts: Secondary | ICD-10-CM

## 2023-11-25 DIAGNOSIS — G8929 Other chronic pain: Secondary | ICD-10-CM | POA: Diagnosis not present

## 2023-11-25 DIAGNOSIS — G629 Polyneuropathy, unspecified: Secondary | ICD-10-CM | POA: Diagnosis not present

## 2023-11-25 DIAGNOSIS — Z79891 Long term (current) use of opiate analgesic: Secondary | ICD-10-CM | POA: Insufficient documentation

## 2023-11-25 DIAGNOSIS — D649 Anemia, unspecified: Secondary | ICD-10-CM | POA: Diagnosis not present

## 2023-11-25 DIAGNOSIS — R197 Diarrhea, unspecified: Secondary | ICD-10-CM | POA: Insufficient documentation

## 2023-11-25 DIAGNOSIS — Z8042 Family history of malignant neoplasm of prostate: Secondary | ICD-10-CM | POA: Diagnosis not present

## 2023-11-25 DIAGNOSIS — R634 Abnormal weight loss: Secondary | ICD-10-CM | POA: Insufficient documentation

## 2023-11-25 LAB — CBC WITH DIFFERENTIAL/PLATELET
Abs Immature Granulocytes: 0.01 K/uL (ref 0.00–0.07)
Basophils Absolute: 0 K/uL (ref 0.0–0.1)
Basophils Relative: 1 %
Eosinophils Absolute: 0.7 K/uL — ABNORMAL HIGH (ref 0.0–0.5)
Eosinophils Relative: 10 %
HCT: 31.3 % — ABNORMAL LOW (ref 39.0–52.0)
Hemoglobin: 10.5 g/dL — ABNORMAL LOW (ref 13.0–17.0)
Immature Granulocytes: 0 %
Lymphocytes Relative: 18 %
Lymphs Abs: 1.3 K/uL (ref 0.7–4.0)
MCH: 32.9 pg (ref 26.0–34.0)
MCHC: 33.5 g/dL (ref 30.0–36.0)
MCV: 98.1 fL (ref 80.0–100.0)
Monocytes Absolute: 0.8 K/uL (ref 0.1–1.0)
Monocytes Relative: 12 %
Neutro Abs: 4.2 K/uL (ref 1.7–7.7)
Neutrophils Relative %: 59 %
Platelets: 182 K/uL (ref 150–400)
RBC: 3.19 MIL/uL — ABNORMAL LOW (ref 4.22–5.81)
RDW: 17.2 % — ABNORMAL HIGH (ref 11.5–15.5)
WBC: 7 K/uL (ref 4.0–10.5)
nRBC: 0 % (ref 0.0–0.2)

## 2023-11-25 LAB — COMPREHENSIVE METABOLIC PANEL WITH GFR
ALT: 9 U/L (ref 0–44)
AST: 15 U/L (ref 15–41)
Albumin: 3.1 g/dL — ABNORMAL LOW (ref 3.5–5.0)
Alkaline Phosphatase: 29 U/L — ABNORMAL LOW (ref 38–126)
Anion gap: 3 — ABNORMAL LOW (ref 5–15)
BUN: 12 mg/dL (ref 8–23)
CO2: 29 mmol/L (ref 22–32)
Calcium: 8.7 mg/dL — ABNORMAL LOW (ref 8.9–10.3)
Chloride: 107 mmol/L (ref 98–111)
Creatinine, Ser: 0.82 mg/dL (ref 0.61–1.24)
GFR, Estimated: >60 mL/min (ref >60)
Glucose, Bld: 146 mg/dL — ABNORMAL HIGH (ref 70–99)
Potassium: 4 mmol/L (ref 3.5–5.1)
Sodium: 139 mmol/L (ref 135–145)
Total Bilirubin: 0.5 mg/dL (ref 0.0–1.2)
Total Protein: 5.5 g/dL — ABNORMAL LOW (ref 6.5–8.1)

## 2023-11-25 MED ORDER — HEPARIN SOD (PORK) LOCK FLUSH 100 UNIT/ML IV SOLN
500.0000 [IU] | Freq: Once | INTRAVENOUS | Status: AC
Start: 2023-11-25 — End: 2023-11-25
  Administered 2023-11-25: 500 [IU]

## 2023-11-25 MED ORDER — SODIUM CHLORIDE 0.9% FLUSH
10.0000 mL | Freq: Once | INTRAVENOUS | Status: AC
Start: 2023-11-25 — End: 2023-11-25
  Administered 2023-11-25: 10 mL
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
## 2023-11-25 NOTE — Telephone Encounter (Signed)
 Patient had not arrived to his appointments ~ Contacted patient via telephone call to inquire whereabouts ~ Patient stated he was in the lobby checking-in.

## 2023-11-26 LAB — CEA (ACCESS): CEA (CHCC): 5.02 ng/mL — ABNORMAL HIGH (ref 0.00–5.00)

## 2023-11-29 ENCOUNTER — Telehealth: Payer: Self-pay | Admitting: Hematology

## 2023-11-29 NOTE — Telephone Encounter (Signed)
 Scheduled appointment per staff message. Talked with the patient and he is aware of the made appointment.

## 2023-11-30 ENCOUNTER — Other Ambulatory Visit: Payer: Self-pay

## 2023-12-01 ENCOUNTER — Other Ambulatory Visit: Payer: Self-pay | Admitting: Hematology

## 2023-12-02 NOTE — Progress Notes (Signed)
 LVM w/[facility] ([phone]) for a return call to add-on a PDL-1 & HER2 Fish to pt's Path# [id number] taken on 08/07/2023.  Requested a return call so test can be ordered.

## 2023-12-03 ENCOUNTER — Other Ambulatory Visit: Payer: Self-pay

## 2023-12-03 NOTE — Progress Notes (Signed)
 Spoke with Alania in Pathology Lab at [facility] [phone]) and she requested if written request for PD-L1 & HER2 FISH be faxed to them for Case # [id number].  Faxed request to [facility] per their directions.  Fax confirmation received.

## 2023-12-04 ENCOUNTER — Ambulatory Visit: Payer: Self-pay | Admitting: Surgery

## 2023-12-04 ENCOUNTER — Telehealth: Payer: Self-pay | Admitting: *Deleted

## 2023-12-04 DIAGNOSIS — C160 Malignant neoplasm of cardia: Secondary | ICD-10-CM

## 2023-12-04 NOTE — Telephone Encounter (Signed)
   Pre-operative Risk Assessment    Patient Name: [name]  DOB: [birth year] MRN: [id number]   Date of last office visit: NONE Date of next office visit: NONE; PT WILL NEED A NEW PT APPT  I CALLED THE SURGEON OFFICE ABOUT A NEW PT REFERRAL. PER THE REQUEST IT STATES AN URGENT REFERRAL WAS PLACED. I HOWEVER, DID NOT SEE THE REQUEST IN EPIC. I ASKED IF REFERRAL CAN BE RE-FAXED TO [phone] ATTN: URGENT REFERRAL FOR NEW PT APPT FOR PREOP CLEARANCE. ONCE THIS IS RECEIVED OUR PAC TEAM WILL CALL THE PT WITH A NEW PT APPT FOR PREOP CLEARANCE.     Request for Surgical Clearance    Procedure:  GASTRECTOMY  Date of Surgery:  Clearance TBD                                Surgeon:  DR. LEONOR DAWN Surgeon's Group or Practice Name:  [facility] Phone number:  [phone] Fax number:  [phone] MICHELLE BROOKS, CMA   Type of Clearance Requested:   - Medical  - Pharmacy:  Hold Apixaban  (Eliquis ) (NOTES PER FORM) PT WAS PLACED ON ELIQUIS  DURING RECENT HOSPITALIZATION FOR A-FIB AND WILL NEED CARDIAC FOLLOW UP  FOR CLEARANCE    Type of Anesthesia:  General    Additional requests/questions:    [name]   12/04/2023, 9:22 AM

## 2023-12-04 NOTE — H&P (Signed)
 History of Present Illness: [name] is a 68 y.o. male who is seen today for follow up of gastric cancer. He was first referred in April. A diagnostic lap with washings did not show evidence of peritoneal disease. He also had an EUS, which showed a 35mm mass in the cardia of the stomach extending to the GE junction, staged as a uT2N0. There was fullness in the underlying GEJ and distal esophagus, suggesting involvement by tumor. He has completed 3 cycles neoadjuvant FLOT, with 4 total cycles planned, but has had severe side effects from chemo requiring hospitalization. He had hypotension after his last cycle and was admitted to the ICU. During his admission he developed gastroenteritis with ileus, and surgery was consulted. This resolved with medical management. He also had a-fib during his ICU course, and an echo showed normal cardiac function. CT scans, most recently on 6/22, did not show any signs of disease progression. He presents today to discuss surgery. He reports he still has some fatigue from chemo, but is able to complete all his ADLs independently. He can ascend a flight of stairs without shortness of breath. He reports he usually only eats 1-2 meals a day. He lost 14 pounds on chemotherapy. Reports he drinks a protein shake in the morning.   He is now on Eliquis  for a-fib, which was started in the hospital. He does not have a cardiologist. Extensive coronary artery calcifications were noted on chest CT, but he denies any anginal symptoms. His only prior abdominal surgery is an umbilical hernia repair.   Review of Systems: A complete review of systems was obtained from the patient.  I have reviewed this information and discussed as appropriate with the patient.  See HPI as well for other ROS.     Medical History: Past Medical History Past Medical History: Diagnosis Date  Arthritis    Asthma, unspecified asthma severity, unspecified whether complicated, unspecified whether persistent  (HHS-HCC)    Diabetes mellitus without complication (CMS/HHS-HCC)         Problem List There is no problem list on file for this patient.     Past Surgical History Past Surgical History: Procedure Laterality Date  Colonoscopy with Propofol    11/02/2014   Dr. EMERSON Louder  HERNIA REPAIR   02/23/2021      Allergies Allergies Allergen Reactions  Hydromorphone  Itching  Lisinopril Nausea      Medications Ordered Prior to Encounter Current Outpatient Medications on File Prior to Visit Medication Sig Dispense Refill  albuterol  MDI, PROVENTIL , VENTOLIN , PROAIR , HFA 90 mcg/actuation inhaler 1-2 PUFFS AS NEEDED INHALATION EVERY 4-6 HRS      amLODIPine  (NORVASC ) 10 MG tablet amlodipine  10 mg tablet  TAKE 1 TABLET EVERY DAY 90 DAYS      apixaban  (ELIQUIS ) 5 mg tablet Take 5 mg by mouth 2 (two) times daily      BELBUCA  750 mcg Film Take 750 mcg by mouth      buPROPion  (WELLBUTRIN  XL) 150 MG XL tablet Take 150 mg by mouth every morning      dexAMETHasone  (DECADRON ) 4 MG tablet Take 8 mg by mouth      ergocalciferol, vitamin D2, 1,250 mcg (50,000 unit) capsule Take 50,000 Units by mouth once a week      hydroCHLOROthiazide (HYDRODIURIL) 25 MG tablet        lidocaine -prilocaine  (EMLA ) cream Apply to affected area once      LINZESS  145 mcg capsule Take 145 mcg by mouth once daily as needed  loperamide  (IMODIUM ) 2 mg capsule Take 2 mg by mouth      losartan (COZAAR) 100 MG tablet losartan 100 mg tablet  TAKE 1 TABLET BY MOUTH EVERY DAY FOR 90 DAYS      metFORMIN (GLUCOPHAGE-XR) 500 MG XR tablet metformin ER 500 mg tablet,extended release 24 hr  TAKE 1 TABLET BY MOUTH EVERY DAY WITH EVENING MEAL FOR 90 DAYS      methocarbamoL (ROBAXIN) 750 MG tablet 1 (ONE) TABLET BY MOUTH DAILY, AS NEEDED FOR MUSCLE SPASMS      metoprolol  tartrate (LOPRESSOR ) 100 MG tablet metoprolol  tartrate 100 mg tablet  TAKE 2 TABLET EVERY DAY WITH FOOD ORALLY ONCE A DAY 90 DAYS      naloxone (NARCAN) 4  mg/actuation nasal spray INSTILL 1 SPRAY INTO NOSE AS NEEDED      omeprazole (PRILOSEC) 20 MG DR capsule omeprazole 20 mg capsule,delayed release  1 CAPSULE 30 MINUTES BEFORE MORNING MEAL BY MOUTH 90 DAYS      ondansetron  (ZOFRAN -ODT) 8 MG disintegrating tablet Take 8 mg by mouth every 8 (eight) hours as needed for Nausea or Vomiting      oxyCODONE -acetaminophen  (PERCOCET) 10-325 mg tablet TAKE 1 (ONE) TABLET FIVE TIMES DAILY, AS NEEDED      pantoprazole  (PROTONIX ) 40 MG DR tablet Take 40 mg by mouth      potassium chloride  (KLOR-CON  M20) 20 MEQ ER tablet Take 20 mEq by mouth      pregabalin  (LYRICA ) 25 MG capsule Take 25 mg by mouth nightly      prochlorperazine  (COMPAZINE ) 10 MG tablet Take 10 mg by mouth every 6 (six) hours as needed for Nausea or Vomiting      rosuvastatin  (CRESTOR ) 20 MG tablet Take 20 mg by mouth once daily      simvastatin (ZOCOR) 20 MG tablet simvastatin 20 mg tablet  1 TABLET IN THE EVENING BY MOUTH 90 DAYS      oxyCODONE  (ROXICODONE ) 15 MG immediate release tablet TAKE 1 TABLET BY MOUTH FOUR TIMES A DAY AS NEEDED (Patient not taking: Reported on 12/04/2023)        No current facility-administered medications on file prior to visit.      Family History History reviewed. No pertinent family history.     Tobacco Use History Social History    Tobacco Use Smoking Status Smoker, Current Status Unknown  Current packs/day: 1.00  Types: Cigarettes Smokeless Tobacco Never      Social History Social History    Socioeconomic History  Marital status: Legally Separated Tobacco Use  Smoking status: Smoker, Current Status Unknown     Current packs/day: 1.00     Types: Cigarettes  Smokeless tobacco: Never Vaping Use  Vaping status: Never Used Substance and Sexual Activity  Alcohol use: Yes  Drug use: Defer  Sexual activity: Defer    Social Drivers of Health    Financial Resource Strain: Low Risk  (08/20/2023)   Received from [facility]   Overall  Financial Resource Strain (CARDIA)    Difficulty of Paying Living Expenses: Not hard at all Food Insecurity: No Food Insecurity (11/05/2023)   Received from [facility]   Hunger Vital Sign    Within the past 12 months, you worried that your food would run out before you got the money to buy more.: Never true    Within the past 12 months, the food you bought just didn't last and you didn't have money to get more.: Never true Transportation Needs: No Transportation Needs (11/05/2023)  [facility] from [facility] - Transportation    In the past 12 months, has lack of transportation kept you from medical appointments or from getting medications?: No    In the past 12 months, has lack of transportation kept you from meetings, work, or from getting things needed for daily living?: No Stress: No Stress Concern Present (08/20/2023)   Received from [facility] - Occupational Stress Questionnaire    Feeling of Stress : Not at all Social Connections: Unknown (11/05/2023)   Received from [facility]   Social Connection and Isolation Panel    In a typical week, how many times do you talk on the phone with family, friends, or neighbors?: Three times a week    How often do you attend church or religious services?: 1 to 4 times per year    Do you belong to any clubs or organizations such as church groups, unions, fraternal or athletic groups, or school groups?: Patient declined    How often do you attend meetings of the clubs or organizations you belong to?: Never    Are you married, widowed, divorced, separated, never married, or living with a partner?: Patient declined Housing Stability: Unknown (08/21/2023)   Housing Stability Vital Sign    Homeless in the Last Year: No      Objective:     Vitals:   12/04/23 0817 12/04/23 0818 BP: (!) 156/80   Pulse: 63   Temp: 36.8 C (98.2 F)   SpO2: 99%   Weight: 81.6 kg (180 lb)   Height: 182.9 cm (6')    PainSc:   0-No pain   Body mass index is 24.41 kg/m.   Physical Exam Vitals reviewed.  Constitutional:      General: He is not in acute distress.    Appearance: Normal appearance.  HENT:     Head: Normocephalic and atraumatic.  Pulmonary:     Effort: Pulmonary effort is normal. No respiratory distress.  Abdominal:     General: There is no distension.     Palpations: Abdomen is soft.     Tenderness: There is no abdominal tenderness.     Comments: Well-healed infraumbilical scar. No scars in the upper abdomen.  Musculoskeletal:        General: Normal range of motion.  Skin:    General: Skin is warm and dry.     Coloration: Skin is not jaundiced.  Neurological:     General: No focal deficit present.     Mental Status: He is alert and oriented to person, place, and time.               Assessment and Plan:   Assessment Diagnoses and all orders for this visit:   Adenocarcinoma of gastric cardia (CMS/HHS-HCC)   Paroxysmal atrial fibrillation (CMS/HHS-HCC) -     Ambulatory Referral to Cardiology   Coronary artery disease involving native coronary artery of native heart without angina pectoris -     Ambulatory Referral to Cardiology       68 yo male with adenocarcinoma with the gastric cardia involving the GE junction. Previous staging laparoscopy did not show peritoneal disease. He has been able to complete 3 cycles FLOT, with no evidence of disease progression or metastatic disease on follow up CT scans. He has had some fatigue but able to function independently and overall I feel he is a candidate for surgery. I discussed that surgical resection will require a total  gastrectomy with roux-en-Y esophagojejunostomy, as well as feeding J tube placement. I discussed the benefits and risks including bleeding, infection, EJ leak, cardiac event, pulmonary complications, and renal failure. I also emphasized he may have a long-term change in lifestyle and eating habits, as well as  prolonged feeding tube dependence postop. Discussed with Dr. Lanny and his MMR testing was normal so he would not be expected to have a response to immunotherapy alone. Surgery is likely the only potentially curative option. He expressed understanding and consents to proceed with surgery. Will request preoperative cardiac risk stratification. Will also request assistance from thoracic surgery in the event that a distal esophagectomy is needed to achieve negative surgical margins, given the proximity of the primary tumor to the GEJ.   Leonor Dawn, MD [facility] Surgery General, Hepatobiliary and Pancreatic Surgery 12/04/23 8:55 AM

## 2023-12-11 ENCOUNTER — Other Ambulatory Visit: Payer: Self-pay
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
## 2023-12-17 NOTE — Assessment & Plan Note (Signed)
-  cT2N0M0, stage I, MMR normal  - He presented with heartburn.  EGD showed medium size infiltrative, fungating and frond-like villous, partially circumferential mass in the cardia, biopsy showed at least intramucosal adenocarcinoma. -He underwent EUS on August 28, 2023, which showed T2 lesion, with possible tumor invading GE junction. -He had exploratory laparoscope on August 29, 2023, which was negative for peritoneal metastasis, he had a port placement. -I recommend neoadjuvant chemotherapy FLOT every 2 weeks for 4 cycles, followed by surgery. He started on 09/12/2023.  Chemo stopped after cycle 3 due to poor tolerance and hospital admission. -He has met general surgeon Dr. Dasie, and the plan to proceed to surgery soon.

## 2023-12-18 ENCOUNTER — Inpatient Hospital Stay: Attending: Hematology | Admitting: Hematology

## 2023-12-18 DIAGNOSIS — C160 Malignant neoplasm of cardia: Secondary | ICD-10-CM
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
## 2023-12-19 ENCOUNTER — Other Ambulatory Visit: Payer: Self-pay

## 2023-12-19 DIAGNOSIS — K219 Gastro-esophageal reflux disease without esophagitis: Secondary | ICD-10-CM | POA: Insufficient documentation

## 2023-12-19 DIAGNOSIS — G709 Myoneural disorder, unspecified: Secondary | ICD-10-CM | POA: Insufficient documentation

## 2023-12-19 DIAGNOSIS — M1990 Unspecified osteoarthritis, unspecified site: Secondary | ICD-10-CM | POA: Insufficient documentation

## 2023-12-19 DIAGNOSIS — T8859XA Other complications of anesthesia, initial encounter: Secondary | ICD-10-CM | POA: Insufficient documentation

## 2023-12-19 DIAGNOSIS — Z01810 Encounter for preprocedural cardiovascular examination: Secondary | ICD-10-CM | POA: Insufficient documentation

## 2023-12-19 DIAGNOSIS — J45909 Unspecified asthma, uncomplicated: Secondary | ICD-10-CM | POA: Insufficient documentation

## 2023-12-19 DIAGNOSIS — G8921 Chronic pain due to trauma: Secondary | ICD-10-CM | POA: Insufficient documentation

## 2023-12-19 DIAGNOSIS — I10 Essential (primary) hypertension: Secondary | ICD-10-CM | POA: Insufficient documentation

## 2023-12-20 ENCOUNTER — Encounter: Payer: Self-pay | Admitting: Cardiology

## 2023-12-20 ENCOUNTER — Ambulatory Visit: Attending: Cardiology | Admitting: Cardiology

## 2023-12-20 VITALS — BP 130/74 | HR 70 | Ht 71.0 in | Wt 177.6 lb

## 2023-12-20 DIAGNOSIS — E7800 Pure hypercholesterolemia, unspecified: Secondary | ICD-10-CM

## 2023-12-20 DIAGNOSIS — I480 Paroxysmal atrial fibrillation: Secondary | ICD-10-CM

## 2023-12-20 DIAGNOSIS — I7789 Other specified disorders of arteries and arterioles: Secondary | ICD-10-CM

## 2023-12-20 DIAGNOSIS — Z7901 Long term (current) use of anticoagulants: Secondary | ICD-10-CM | POA: Diagnosis not present

## 2023-12-20 DIAGNOSIS — Z01810 Encounter for preprocedural cardiovascular examination: Secondary | ICD-10-CM

## 2023-12-20 DIAGNOSIS — I119 Hypertensive heart disease without heart failure: Secondary | ICD-10-CM | POA: Diagnosis not present

## 2023-12-20 DIAGNOSIS — C160 Malignant neoplasm of cardia: Secondary | ICD-10-CM

## 2023-12-20 DIAGNOSIS — I2510 Atherosclerotic heart disease of native coronary artery without angina pectoris: Secondary | ICD-10-CM

## 2023-12-20 NOTE — Telephone Encounter (Signed)
 Preop clearance completed on 12/20/23

## 2023-12-20 NOTE — Telephone Encounter (Signed)
 Office calling to f/u on Clearance being that pt had appt today. Please advise

## 2023-12-20 NOTE — Patient Instructions (Signed)
 Medication Instructions:  Your physician recommends that you continue on your current medications as directed. Please refer to the Current Medication list given to you today.  *If you need a refill on your cardiac medications before your next appointment, please call your pharmacy*  Lab Work: None If you have labs (blood work) drawn today and your tests are completely normal, you will receive your results only by: MyChart Message (if you have MyChart) OR A paper copy in the mail If you have any lab test that is abnormal or we need to change your treatment, we will call you to review the results.  Testing/Procedures: None  Follow-Up: At [facility], you and your health needs are our priority.  As part of our continuing mission to provide you with exceptional heart care, our providers are all part of one team.  This team includes your primary Cardiologist (physician) and Advanced Practice Providers or APPs (Physician Assistants and Nurse Practitioners) who all work together to provide you with the care you need, when you need it.  Your next appointment:   Follow up as needed  Provider:   Redell Leiter, MD    We recommend signing up for the patient portal called MyChart.  Sign up information is provided on this After Visit Summary.  MyChart is used to connect with patients for Virtual Visits (Telemedicine).  Patients are able to view lab/test results, encounter notes, upcoming appointments, etc.  Non-urgent messages can be sent to your provider as well.   To learn more about what you can do with MyChart, go to [email or web address].   Other Instructions None

## 2023-12-24 ENCOUNTER — Other Ambulatory Visit: Payer: Self-pay

## 2023-12-24 ENCOUNTER — Encounter: Payer: Self-pay | Admitting: *Deleted

## 2023-12-24 ENCOUNTER — Other Ambulatory Visit: Payer: Self-pay | Admitting: *Deleted

## 2023-12-24 DIAGNOSIS — C160 Malignant neoplasm of cardia: Secondary | ICD-10-CM

## 2024-01-09 NOTE — Progress Notes (Signed)
 Surgical Instructions   Your procedure is scheduled on Wednesday January 15, 2024. Report to [facility] Main Entrance A at 6:30 A.M., then check in with the Admitting office. Any questions or running late day of surgery: call [phone]  Questions prior to your surgery date: call [phone], Monday-Friday, 8am-4pm. If you experience any cold or flu symptoms such as cough, fever, chills, shortness of breath, etc. between now and your scheduled surgery, please notify us  at the above number.     Remember:  Do not eat after midnight the night before your surgery   You may drink clear liquids until Midnight the night before your surgery.    Clear liquids allowed are: Water, Non-Citrus Juices (without pulp), Carbonated Beverages, Clear Tea (no milk, honey, etc.), Black Coffee Only (NO MILK, CREAM OR POWDERED CREAMER of any kind), and Gatorade.  Patient Instructions  The night before surgery:  No food after midnight. ONLY clear liquids after midnight  The day BEFORE YOUR surgery (if you have diabetes): Drink ONE (2) 12 oz G2 given to you in your pre admission testing appointment by MIDNIGHT the night before surgery. Drink in one sitting. Do not sip.  This drink was given to you during your hospital  pre-op appointment visit.  Nothing else to drink after completing the  12 oz bottle of G2.         If you have questions, please contact your surgeon's office.    Take these medicines the morning of surgery with A SIP OF WATER  buPROPion  (WELLBUTRIN  XL)  metoprolol  tartrate (LOPRESSOR )  pantoprazole  (PROTONIX )  rosuvastatin  (CRESTOR )   May take these medicines IF NEEDED: albuterol  (VENTOLIN  HFA) 108 (90 Base) MCG/ACT inhaler Please bring with you to the hospital ondansetron  (ZOFRAN -ODT)  oxyCODONE -acetaminophen  (PERCOCET)   PER YOUR SURGEON'S INSTRUCTIONS, PLEASE HOLD YOUR apixaban  (ELIQUIS ) 3 DAYS PRIOR TO SURGERY WITH THE LAST DOSE BEING 01/11/2024.  PLEASE FOLLOW YOUR  PROVIDER'S INSTRUCTIONS WHEN TO STOP YOUR Buprenorphine  HCl.      One week prior to surgery, STOP taking any Aspirin (unless otherwise instructed by your surgeon) Aleve, Naproxen, Ibuprofen , Motrin , Advil , Goody's, BC's, all herbal medications, fish oil, and non-prescription vitamins.  WHAT DO I DO ABOUT MY DIABETES MEDICATION?   Do not take oral diabetes medicines (pills) the morning of surgery.        PER YOUR SURGEON'S INSTRUCTIONS, PLEASE HOLD YOUR metFORMIN (GLUCOPHAGE-XR) 2 DAYS PRIOR TO SURGERY WITH THE LAST DOSE BEING 01/12/2024.     The day of surgery, do not take other diabetes injectables, including Byetta (exenatide), Bydureon (exenatide ER), Victoza (liraglutide), or Trulicity (dulaglutide).  HOW TO MANAGE YOUR DIABETES BEFORE AND AFTER SURGERY  Why is it important to control my blood sugar before and after surgery? Improving blood sugar levels before and after surgery helps healing and can limit problems. A way of improving blood sugar control is eating a healthy diet by:  Eating less sugar and carbohydrates  Increasing activity/exercise  Talking with your doctor about reaching your blood sugar goals High blood sugars (greater than 180 mg/dL) can raise your risk of infections and slow your recovery, so you will need to focus on controlling your diabetes during the weeks before surgery. Make sure that the doctor who takes care of your diabetes knows about your planned surgery including the date and location.  How do I manage my blood sugar before surgery? Check your blood sugar at least 4 times a day, starting 2 days before surgery, to make sure that  the level is not too high or low.  Check your blood sugar the morning of your surgery when you wake up and every 2 hours until you get to the Short Stay unit.  If your blood sugar is less than 70 mg/dL, you will need to treat for low blood sugar: Do not take insulin . Treat a low blood sugar (less than 70 mg/dL) with  cup of  clear juice (cranberry or apple), 4 glucose tablets, OR glucose gel. Recheck blood sugar in 15 minutes after treatment (to make sure it is greater than 70 mg/dL). If your blood sugar is not greater than 70 mg/dL on recheck, call [phone] for further instructions. Report your blood sugar to the short stay nurse when you get to Short Stay.  If you are admitted to the hospital after surgery: Your blood sugar will be checked by the staff and you will probably be given insulin  after surgery (instead of oral diabetes medicines) to make sure you have good blood sugar levels. The goal for blood sugar control after surgery is 80-180 mg/dL.                      Do NOT Smoke (Tobacco/Vaping) for 24 hours prior to your procedure.  If you use a CPAP at night, you may bring your mask/headgear for your overnight stay.   You will be asked to remove any contacts, glasses, piercing's, hearing aid's, dentures/partials prior to surgery. Please bring cases for these items if needed.    Patients discharged the day of surgery will not be allowed to drive home, and someone needs to stay with them for 24 hours.  SURGICAL WAITING ROOM VISITATION Patients may have no more than 2 support people in the waiting area - these visitors may rotate.   Pre-op nurse will coordinate an appropriate time for 1 ADULT support person, who may not rotate, to accompany patient in pre-op.  Children under the age of 3 must have an adult with them who is not the patient and must remain in the main waiting area with an adult.  If the patient needs to stay at the hospital during part of their recovery, the visitor guidelines for inpatient rooms apply.  Please refer to the [facility] website for the visitor guidelines for any additional information.   If you received a COVID test during your pre-op visit  it is requested that you wear a mask when out in public, stay away from anyone that may not be feeling well and notify your  surgeon if you develop symptoms. If you have been in contact with anyone that has tested positive in the last 10 days please notify you surgeon.      Pre-operative CHG Bathing Instructions   You can play a key role in reducing the risk of infection after surgery. Your skin needs to be as free of germs as possible. You can reduce the number of germs on your skin by washing with CHG (chlorhexidine  gluconate) soap before surgery. CHG is an antiseptic soap that kills germs and continues to kill germs even after washing.   DO NOT use if you have an allergy to chlorhexidine /CHG or antibacterial soaps. If your skin becomes reddened or irritated, stop using the CHG and notify one of our RNs at [phone].              TAKE A SHOWER THE NIGHT BEFORE SURGERY AND THE DAY OF SURGERY    Please keep in mind  the following:  You may shave your face before/day of surgery.  Place clean sheets on your bed the night before surgery Use a clean washcloth (not used since being washed) for each shower. DO NOT sleep with pet's night before surgery.  CHG Shower Instructions:  Wash your face and private area with normal soap. If you choose to wash your hair, wash first with your normal shampoo.  After you use shampoo/soap, rinse your hair and body thoroughly to remove shampoo/soap residue.  Turn the water OFF and apply half the bottle of CHG soap to a CLEAN washcloth.  Apply CHG soap ONLY FROM YOUR NECK DOWN TO YOUR TOES (washing for 3-5 minutes)  DO NOT use CHG soap on face, private areas, open wounds, or sores.  Pay special attention to the area where your surgery is being performed.  If you are having back surgery, having someone wash your back for you may be helpful. Wait 2 minutes after CHG soap is applied, then you may rinse off the CHG soap.  Pat dry with a clean towel  Put on clean pajamas    Additional instructions for the day of surgery: DO NOT APPLY any lotions, deodorants or cologne.   Do not  wear jewelry  Do not bring valuables to the hospital. [facility] is not responsible for valuables/personal belongings. Put on clean/comfortable clothes.  Please brush your teeth.  Ask your nurse before applying any prescription medications to the skin.

## 2024-01-10 ENCOUNTER — Other Ambulatory Visit: Payer: Self-pay

## 2024-01-10 ENCOUNTER — Encounter (HOSPITAL_COMMUNITY)
Admission: RE | Admit: 2024-01-10 | Discharge: 2024-01-10 | Disposition: A | Discharge status: Home / Self Care | Source: Ambulatory Visit | Attending: Surgery | Admitting: Surgery

## 2024-01-10 ENCOUNTER — Encounter (HOSPITAL_COMMUNITY): Payer: Self-pay

## 2024-01-10 ENCOUNTER — Ambulatory Visit (HOSPITAL_COMMUNITY)
Admission: RE | Admit: 2024-01-10 | Discharge: 2024-01-10 | Disposition: A | Discharge status: Home / Self Care | Source: Ambulatory Visit | Attending: Thoracic Surgery (Cardiothoracic Vascular Surgery) | Admitting: Thoracic Surgery (Cardiothoracic Vascular Surgery)

## 2024-01-10 VITALS — BP 176/89 | HR 64 | Temp 98.3°F | Resp 16 | Ht 71.0 in | Wt 178.0 lb

## 2024-01-10 DIAGNOSIS — J439 Emphysema, unspecified: Secondary | ICD-10-CM | POA: Insufficient documentation

## 2024-01-10 DIAGNOSIS — E119 Type 2 diabetes mellitus without complications: Secondary | ICD-10-CM | POA: Diagnosis not present

## 2024-01-10 DIAGNOSIS — Z8619 Personal history of other infectious and parasitic diseases: Secondary | ICD-10-CM | POA: Insufficient documentation

## 2024-01-10 DIAGNOSIS — Z7901 Long term (current) use of anticoagulants: Secondary | ICD-10-CM | POA: Insufficient documentation

## 2024-01-10 DIAGNOSIS — Z01818 Encounter for other preprocedural examination: Secondary | ICD-10-CM | POA: Insufficient documentation

## 2024-01-10 DIAGNOSIS — Z79899 Other long term (current) drug therapy: Secondary | ICD-10-CM | POA: Insufficient documentation

## 2024-01-10 DIAGNOSIS — K219 Gastro-esophageal reflux disease without esophagitis: Secondary | ICD-10-CM | POA: Diagnosis not present

## 2024-01-10 DIAGNOSIS — C160 Malignant neoplasm of cardia: Secondary | ICD-10-CM | POA: Insufficient documentation

## 2024-01-10 HISTORY — DX: Headache, unspecified: R51.9

## 2024-01-10 LAB — COMPREHENSIVE METABOLIC PANEL WITH GFR
ALT: 11 U/L (ref 0–44)
AST: 17 U/L (ref 15–41)
Albumin: 3.4 g/dL — ABNORMAL LOW (ref 3.5–5.0)
Alkaline Phosphatase: 32 U/L — ABNORMAL LOW (ref 38–126)
Anion gap: 11 (ref 5–15)
BUN: 11 mg/dL (ref 8–23)
CO2: 27 mmol/L (ref 22–32)
Calcium: 9.5 mg/dL (ref 8.9–10.3)
Chloride: 98 mmol/L (ref 98–111)
Creatinine, Ser: 0.84 mg/dL (ref 0.61–1.24)
GFR, Estimated: >60 mL/min (ref >60)
Glucose, Bld: 99 mg/dL (ref 70–99)
Potassium: 4.2 mmol/L (ref 3.5–5.1)
Sodium: 136 mmol/L (ref 135–145)
Total Bilirubin: 0.6 mg/dL (ref 0.0–1.2)
Total Protein: 6.7 g/dL (ref 6.5–8.1)

## 2024-01-10 LAB — URINALYSIS, ROUTINE W REFLEX MICROSCOPIC
Bilirubin Urine: NEGATIVE
Glucose, UA: NEGATIVE mg/dL
Hgb urine dipstick: NEGATIVE
Ketones, ur: NEGATIVE mg/dL
Leukocytes,Ua: NEGATIVE
Nitrite: NEGATIVE
Protein, ur: NEGATIVE mg/dL
Specific Gravity, Urine: 1.012 (ref 1.005–1.030)
pH: 5 (ref 5.0–8.0)

## 2024-01-10 LAB — PROTIME-INR
INR: 1.1 (ref 0.8–1.2)
Prothrombin Time: 15.1 s (ref 11.4–15.2)

## 2024-01-10 LAB — SURGICAL PCR SCREEN
MRSA, PCR: NEGATIVE
Staphylococcus aureus: NEGATIVE

## 2024-01-10 LAB — CBC
HCT: 41.3 % (ref 39.0–52.0)
Hemoglobin: 13.7 g/dL (ref 13.0–17.0)
MCH: 33.4 pg (ref 26.0–34.0)
MCHC: 33.2 g/dL (ref 30.0–36.0)
MCV: 100.7 fL — ABNORMAL HIGH (ref 80.0–100.0)
Platelets: 201 K/uL (ref 150–400)
RBC: 4.1 MIL/uL — ABNORMAL LOW (ref 4.22–5.81)
RDW: 12.7 % (ref 11.5–15.5)
WBC: 11 K/uL — ABNORMAL HIGH (ref 4.0–10.5)
nRBC: 0 % (ref 0.0–0.2)

## 2024-01-10 LAB — PREPARE RBC (CROSSMATCH)

## 2024-01-10 LAB — APTT: aPTT: 32 s (ref 24–36)

## 2024-01-10 LAB — GLUCOSE, CAPILLARY: Glucose-Capillary: 122 mg/dL — ABNORMAL HIGH (ref 70–99)

## 2024-01-10 NOTE — Progress Notes (Signed)
 PCP -  Sun, Vyvyan, MD    Cardiologist - Redell Leiter, MD   PPM/ICD - denies Device Orders - n/a Rep Notified - n/a  Chest x-ray - 01-10-24 EKG - 12-20-23 Stress Test -  ECHO - 11-03-23 Cardiac Cath -   Sleep Study - denies CPAP - n/a  Fasting Blood Sugar - Does not check blood sugar at home Checks Blood Sugar MD monitors A1c. Last A1c on 10-03-23 6.6  Last dose of GLP1 agonist-   GLP1 instructions:   Blood Thinner Instructions:apixaban  (ELIQUIS ) Last dose 01-11-24 Aspirin Instructions:n/a  ERAS Protcol - NPO PRE-SURGERY   G2-  2 day before surgery before midnight  COVID TEST- yes   Anesthesia review: Yes hx, HTN, CAD, CODP, DM  Patient denies shortness of breath, fever, cough and chest pain at PAT appointment   All instructions explained to the patient, with a verbal understanding of the material. Patient agrees to go over the instructions while at home for a better understanding. Patient also instructed to self quarantine after being tested for COVID-19. The opportunity to ask questions was provided.

## 2024-01-14 NOTE — Anesthesia Preprocedure Evaluation (Addendum)
 Anesthesia Evaluation  Patient identified by MRN, date of birth, ID band Patient awake    Reviewed: Allergy & Precautions, H&P , NPO status , Patient's Chart, lab work & pertinent test results  Airway Mallampati: II   Neck ROM: full    Dental   Pulmonary asthma , COPD, Current Smoker and Patient abstained from smoking.   breath sounds clear to auscultation       Cardiovascular hypertension, + dysrhythmias Atrial Fibrillation  Rhythm:regular Rate:Normal     Neuro/Psych  Headaches PSYCHIATRIC DISORDERS  Depression     Neuromuscular disease    GI/Hepatic ,GERD  ,,(+) Hepatitis -, CGastric CA   Endo/Other  diabetes, Type 2    Renal/GU      Musculoskeletal  (+) Arthritis ,    Abdominal   Peds  Hematology   Anesthesia Other Findings   Reproductive/Obstetrics                              Anesthesia Physical Anesthesia Plan  ASA: 3  Anesthesia Plan: General   Post-op Pain Management:    Induction: Intravenous  PONV Risk Score and Plan: 1 and Ondansetron , Dexamethasone , Midazolam  and Treatment may vary due to age or medical condition  Airway Management Planned: Oral ETT and Double Lumen EBT  Additional Equipment: Arterial line  Intra-op Plan:   Post-operative Plan: Extubation in OR  Informed Consent: I have reviewed the patients History and Physical, chart, labs and discussed the procedure including the risks, benefits and alternatives for the proposed anesthesia with the patient or authorized representative who has indicated his/her understanding and acceptance.     Dental advisory given  Plan Discussed with: CRNA, Anesthesiologist and Surgeon  Anesthesia Plan Comments: (PAT note by Lynwood Hope, PA-C: 68 year old male with pertinent history including current smoker with associated COPD, asthma, GERD on PPI, non-insulin -dependent DM2 (A1c 6.6 on 10/02/2023), hepatitis C s/p treatment  with Harvoni 2018, HTN, chronic pain, recent diagnosis of gastric adenocarcinoma on neoadjuvant chemotherapy.  Recent admission in June 2025 for hypovolemic shock in the setting of gastroenteritis.  He had a brief episode of atrial fibrillation during admission.  Echo showed EF >75%, normal RV, no significant valvular abnormalities.  He had subsequent follow-up/preop evaluation with cardiologist Dr. Monetta on 12/20/2023.  Per note, 1.From a cardiology perspective he is optimized for his planned surgical procedure which is a increased risk elective general anesthesia. 2.His cardiac problems include a single brief episode of atrial fibrillation in the setting of acute medical illness I certainly would not commit him to an antiarrhythmic drug without clinical recurrence. 3.Postoperatively he should go to a monitored bed the first 48 hours and I will check daily EKGs on him.  Continue his usual cardiac medications including antihypertensives and his beta-blocker. 4.He will have his anticoagulant withdrawn prior to surgery and resume postoperatively when safe from bleeding perspective. 5.Not uncommon at age 73 to see coronary calcification I do not think he requires a preoperative ischemic evaluation. 6.Continue his current statin with coronary atherosclerosis. 7.Mild enlargement of the ascending aorta will need a follow-up CT scan in 1 to 2 years noncontrast in his age group uncommon to progress to needing intervention. 8.Any problems from a cardiac perspective please contact heart care to become involved in his hospital stay.  Patient reports last dose of Eliquis  01/11/2024.  Preop labs reviewed, unremarkable.  EKG 12/20/23: Normal sinus rhythm.  Rate 70. Abnormal R wave progression  CHEST - 2  VIEW 01/10/2024:  COMPARISON:  Chest radiograph 11/10/2023, CT 11/03/2023  FINDINGS: Right chest port in place. The heart is normal in size. Mediastinal contours are stable. Mild emphysema. No focal airspace  disease, pleural effusion or pneumothorax. On limited assessment, no acute osseous findings.  IMPRESSION: 1. No acute chest findings. 2. Mild emphysema.  TTE 11/03/2023: 1. Left ventricular ejection fraction, by estimation, is >75%. The left  ventricle has hyperdynamic function. Indeterminate diastolic filling due  to E-A fusion.  2. Right ventricular systolic function is normal. The right ventricular  size is normal.  3. The mitral valve is grossly normal. No evidence of mitral valve  regurgitation.  4. The aortic valve is tricuspid. Aortic valve regurgitation is not  visualized.    )         Anesthesia Quick Evaluation

## 2024-01-14 NOTE — Progress Notes (Addendum)
 Anesthesia Chart Review:  68 year old male with pertinent history including current smoker with associated COPD, asthma, GERD on PPI, non-insulin -dependent DM2 (A1c 6.6 on 10/02/2023), hepatitis C s/p treatment with Harvoni 2018, HTN, chronic pain, recent diagnosis of gastric adenocarcinoma on neoadjuvant chemotherapy.  Recent admission in June 2025 for hypovolemic shock in the setting of gastroenteritis.  He had a brief episode of atrial fibrillation during admission.  Echo showed EF >75%, normal RV, no significant valvular abnormalities.  He had subsequent follow-up/preop evaluation with cardiologist Dr. Monetta on 12/20/2023.  Per note, 1.From a cardiology perspective he is optimized for his planned surgical procedure which is a increased risk elective general anesthesia. 2.His cardiac problems include a single brief episode of atrial fibrillation in the setting of acute medical illness I certainly would not commit him to an antiarrhythmic drug without clinical recurrence. 3.Postoperatively he should go to a monitored bed the first 48 hours and I will check daily EKGs on him.  Continue his usual cardiac medications including antihypertensives and his beta-blocker. 4.He will have his anticoagulant withdrawn prior to surgery and resume postoperatively when safe from bleeding perspective. 5.Not uncommon at age 10 to see coronary calcification I do not think he requires a preoperative ischemic evaluation. 6.Continue his current statin with coronary atherosclerosis. 7.Mild enlargement of the ascending aorta will need a follow-up CT scan in 1 to 2 years noncontrast in his age group uncommon to progress to needing intervention. 8.Any problems from a cardiac perspective please contact heart care to become involved in his hospital stay.  Patient reports last dose of Eliquis  01/11/2024.  Preop labs reviewed, unremarkable.  EKG 12/20/23: Normal sinus rhythm.  Rate 70. Abnormal R wave progression  CHEST - 2 VIEW  01/10/2024:   COMPARISON:  Chest radiograph 11/10/2023, CT 11/03/2023   FINDINGS: Right chest port in place. The heart is normal in size. Mediastinal contours are stable. Mild emphysema. No focal airspace disease, pleural effusion or pneumothorax. On limited assessment, no acute osseous findings.   IMPRESSION: 1. No acute chest findings. 2. Mild emphysema.  TTE 11/03/2023: 1. Left ventricular ejection fraction, by estimation, is >75%. The left  ventricle has hyperdynamic function. Indeterminate diastolic filling due  to E-A fusion.   2. Right ventricular systolic function is normal. The right ventricular  size is normal.   3. The mitral valve is grossly normal. No evidence of mitral valve  regurgitation.   4. The aortic valve is tricuspid. Aortic valve regurgitation is not  visualized.     Lynwood Geofm RIGGERS [facility]/Anesthesiology Phone [phone] 01/14/2024 9:09 AM

## 2024-01-15 ENCOUNTER — Other Ambulatory Visit: Payer: Self-pay

## 2024-01-15 ENCOUNTER — Inpatient Hospital Stay (HOSPITAL_COMMUNITY): Payer: Self-pay | Admitting: Anesthesiology

## 2024-01-15 ENCOUNTER — Inpatient Hospital Stay (HOSPITAL_COMMUNITY)
Admission: RE | Admit: 2024-01-15 | Discharge: 2024-03-14 | DRG: 326 | Disposition: A | Discharge status: Skilled Nursing Facility | Attending: Surgery | Admitting: Surgery

## 2024-01-15 ENCOUNTER — Encounter (HOSPITAL_COMMUNITY)
Admission: RE | Disposition: A | Discharge status: Skilled Nursing Facility | Payer: Self-pay | Source: Home / Self Care | Attending: Surgery

## 2024-01-15 ENCOUNTER — Encounter (HOSPITAL_COMMUNITY): Payer: Self-pay | Admitting: Surgery

## 2024-01-15 DIAGNOSIS — G7281 Critical illness myopathy: Secondary | ICD-10-CM | POA: Diagnosis present

## 2024-01-15 DIAGNOSIS — J9811 Atelectasis: Secondary | ICD-10-CM | POA: Diagnosis present

## 2024-01-15 DIAGNOSIS — K832 Perforation of bile duct: Secondary | ICD-10-CM | POA: Diagnosis not present

## 2024-01-15 DIAGNOSIS — R140 Abdominal distension (gaseous): Secondary | ICD-10-CM | POA: Diagnosis present

## 2024-01-15 DIAGNOSIS — Z825 Family history of asthma and other chronic lower respiratory diseases: Secondary | ICD-10-CM

## 2024-01-15 DIAGNOSIS — Z87442 Personal history of urinary calculi: Secondary | ICD-10-CM

## 2024-01-15 DIAGNOSIS — R410 Disorientation, unspecified: Secondary | ICD-10-CM

## 2024-01-15 DIAGNOSIS — F17210 Nicotine dependence, cigarettes, uncomplicated: Secondary | ICD-10-CM | POA: Diagnosis present

## 2024-01-15 DIAGNOSIS — J189 Pneumonia, unspecified organism: Secondary | ICD-10-CM | POA: Diagnosis not present

## 2024-01-15 DIAGNOSIS — D649 Anemia, unspecified: Secondary | ICD-10-CM | POA: Diagnosis not present

## 2024-01-15 DIAGNOSIS — E870 Hyperosmolality and hypernatremia: Secondary | ICD-10-CM | POA: Diagnosis present

## 2024-01-15 DIAGNOSIS — J9601 Acute respiratory failure with hypoxia: Secondary | ICD-10-CM

## 2024-01-15 DIAGNOSIS — I4891 Unspecified atrial fibrillation: Secondary | ICD-10-CM

## 2024-01-15 DIAGNOSIS — I468 Cardiac arrest due to other underlying condition: Secondary | ICD-10-CM | POA: Diagnosis present

## 2024-01-15 DIAGNOSIS — Z7982 Long term (current) use of aspirin: Secondary | ICD-10-CM

## 2024-01-15 DIAGNOSIS — Z800 Family history of malignant neoplasm of digestive organs: Secondary | ICD-10-CM

## 2024-01-15 DIAGNOSIS — I77819 Aortic ectasia, unspecified site: Secondary | ICD-10-CM | POA: Diagnosis present

## 2024-01-15 DIAGNOSIS — R1030 Lower abdominal pain, unspecified: Secondary | ICD-10-CM | POA: Diagnosis present

## 2024-01-15 DIAGNOSIS — E7800 Pure hypercholesterolemia, unspecified: Secondary | ICD-10-CM | POA: Diagnosis present

## 2024-01-15 DIAGNOSIS — Z860101 Personal history of adenomatous and serrated colon polyps: Secondary | ICD-10-CM

## 2024-01-15 DIAGNOSIS — K219 Gastro-esophageal reflux disease without esophagitis: Secondary | ICD-10-CM | POA: Diagnosis present

## 2024-01-15 DIAGNOSIS — E1165 Type 2 diabetes mellitus with hyperglycemia: Secondary | ICD-10-CM | POA: Diagnosis present

## 2024-01-15 DIAGNOSIS — I2510 Atherosclerotic heart disease of native coronary artery without angina pectoris: Secondary | ICD-10-CM | POA: Diagnosis present

## 2024-01-15 DIAGNOSIS — Z7901 Long term (current) use of anticoagulants: Secondary | ICD-10-CM | POA: Diagnosis not present

## 2024-01-15 DIAGNOSIS — J9602 Acute respiratory failure with hypercapnia: Secondary | ICD-10-CM | POA: Diagnosis not present

## 2024-01-15 DIAGNOSIS — J449 Chronic obstructive pulmonary disease, unspecified: Secondary | ICD-10-CM | POA: Diagnosis not present

## 2024-01-15 DIAGNOSIS — Z8249 Family history of ischemic heart disease and other diseases of the circulatory system: Secondary | ICD-10-CM

## 2024-01-15 DIAGNOSIS — R6521 Severe sepsis with septic shock: Secondary | ICD-10-CM | POA: Diagnosis present

## 2024-01-15 DIAGNOSIS — Z23 Encounter for immunization: Secondary | ICD-10-CM

## 2024-01-15 DIAGNOSIS — Z66 Do not resuscitate: Secondary | ICD-10-CM | POA: Diagnosis present

## 2024-01-15 DIAGNOSIS — D72829 Elevated white blood cell count, unspecified: Secondary | ICD-10-CM | POA: Diagnosis not present

## 2024-01-15 DIAGNOSIS — J869 Pyothorax without fistula: Secondary | ICD-10-CM | POA: Diagnosis not present

## 2024-01-15 DIAGNOSIS — R092 Respiratory arrest: Secondary | ICD-10-CM | POA: Diagnosis not present

## 2024-01-15 DIAGNOSIS — I4901 Ventricular fibrillation: Secondary | ICD-10-CM | POA: Diagnosis not present

## 2024-01-15 DIAGNOSIS — T451X5A Adverse effect of antineoplastic and immunosuppressive drugs, initial encounter: Secondary | ICD-10-CM | POA: Diagnosis present

## 2024-01-15 DIAGNOSIS — D62 Acute posthemorrhagic anemia: Secondary | ICD-10-CM | POA: Diagnosis present

## 2024-01-15 DIAGNOSIS — G928 Other toxic encephalopathy: Secondary | ICD-10-CM | POA: Diagnosis present

## 2024-01-15 DIAGNOSIS — K9189 Other postprocedural complications and disorders of digestive system: Secondary | ICD-10-CM | POA: Diagnosis not present

## 2024-01-15 DIAGNOSIS — I10 Essential (primary) hypertension: Secondary | ICD-10-CM

## 2024-01-15 DIAGNOSIS — D638 Anemia in other chronic diseases classified elsewhere: Secondary | ICD-10-CM | POA: Diagnosis present

## 2024-01-15 DIAGNOSIS — E43 Unspecified severe protein-calorie malnutrition: Secondary | ICD-10-CM | POA: Diagnosis present

## 2024-01-15 DIAGNOSIS — I469 Cardiac arrest, cause unspecified: Secondary | ICD-10-CM

## 2024-01-15 DIAGNOSIS — T4145XA Adverse effect of unspecified anesthetic, initial encounter: Secondary | ICD-10-CM | POA: Diagnosis present

## 2024-01-15 DIAGNOSIS — N179 Acute kidney failure, unspecified: Secondary | ICD-10-CM | POA: Diagnosis present

## 2024-01-15 DIAGNOSIS — A419 Sepsis, unspecified organism: Secondary | ICD-10-CM | POA: Diagnosis present

## 2024-01-15 DIAGNOSIS — Z85028 Personal history of other malignant neoplasm of stomach: Secondary | ICD-10-CM

## 2024-01-15 DIAGNOSIS — C160 Malignant neoplasm of cardia: Secondary | ICD-10-CM

## 2024-01-15 DIAGNOSIS — F05 Delirium due to known physiological condition: Secondary | ICD-10-CM | POA: Diagnosis not present

## 2024-01-15 DIAGNOSIS — E8729 Other acidosis: Secondary | ICD-10-CM | POA: Diagnosis present

## 2024-01-15 DIAGNOSIS — Z888 Allergy status to other drugs, medicaments and biological substances status: Secondary | ICD-10-CM

## 2024-01-15 DIAGNOSIS — J690 Pneumonitis due to inhalation of food and vomit: Secondary | ICD-10-CM | POA: Diagnosis not present

## 2024-01-15 DIAGNOSIS — G894 Chronic pain syndrome: Secondary | ICD-10-CM | POA: Diagnosis present

## 2024-01-15 DIAGNOSIS — Z885 Allergy status to narcotic agent status: Secondary | ICD-10-CM

## 2024-01-15 DIAGNOSIS — E119 Type 2 diabetes mellitus without complications: Secondary | ICD-10-CM | POA: Diagnosis not present

## 2024-01-15 DIAGNOSIS — G929 Unspecified toxic encephalopathy: Secondary | ICD-10-CM | POA: Diagnosis not present

## 2024-01-15 DIAGNOSIS — I4819 Other persistent atrial fibrillation: Secondary | ICD-10-CM | POA: Diagnosis present

## 2024-01-15 DIAGNOSIS — I9581 Postprocedural hypotension: Secondary | ICD-10-CM | POA: Diagnosis not present

## 2024-01-15 DIAGNOSIS — Z6821 Body mass index (BMI) 21.0-21.9, adult: Secondary | ICD-10-CM

## 2024-01-15 DIAGNOSIS — I480 Paroxysmal atrial fibrillation: Secondary | ICD-10-CM | POA: Diagnosis not present

## 2024-01-15 DIAGNOSIS — M62838 Other muscle spasm: Secondary | ICD-10-CM | POA: Diagnosis present

## 2024-01-15 DIAGNOSIS — E871 Hypo-osmolality and hyponatremia: Secondary | ICD-10-CM | POA: Diagnosis present

## 2024-01-15 DIAGNOSIS — C169 Malignant neoplasm of stomach, unspecified: Secondary | ICD-10-CM | POA: Diagnosis not present

## 2024-01-15 DIAGNOSIS — Z8042 Family history of malignant neoplasm of prostate: Secondary | ICD-10-CM

## 2024-01-15 DIAGNOSIS — Z79899 Other long term (current) drug therapy: Secondary | ICD-10-CM

## 2024-01-15 DIAGNOSIS — Z452 Encounter for adjustment and management of vascular access device: Secondary | ICD-10-CM | POA: Diagnosis not present

## 2024-01-15 DIAGNOSIS — Z8619 Personal history of other infectious and parasitic diseases: Secondary | ICD-10-CM

## 2024-01-15 DIAGNOSIS — Z7984 Long term (current) use of oral hypoglycemic drugs: Secondary | ICD-10-CM

## 2024-01-15 DIAGNOSIS — R600 Localized edema: Secondary | ICD-10-CM | POA: Diagnosis present

## 2024-01-15 DIAGNOSIS — Z794 Long term (current) use of insulin: Secondary | ICD-10-CM

## 2024-01-15 DIAGNOSIS — J80 Acute respiratory distress syndrome: Secondary | ICD-10-CM | POA: Diagnosis not present

## 2024-01-15 DIAGNOSIS — J440 Chronic obstructive pulmonary disease with acute lower respiratory infection: Secondary | ICD-10-CM | POA: Diagnosis present

## 2024-01-15 DIAGNOSIS — J439 Emphysema, unspecified: Secondary | ICD-10-CM | POA: Diagnosis present

## 2024-01-15 DIAGNOSIS — R197 Diarrhea, unspecified: Secondary | ICD-10-CM | POA: Diagnosis not present

## 2024-01-15 HISTORY — PX: GASTRECTOMY: SHX58

## 2024-01-15 HISTORY — PX: LAPAROSCOPY: SHX197

## 2024-01-15 HISTORY — PX: ESOPHAGECTOMY, ROBOT-ASSISTED: SHX7557

## 2024-01-15 HISTORY — DX: Malignant neoplasm of cardia: C16.0

## 2024-01-15 HISTORY — PX: JEJUNOSTOMY: SHX313

## 2024-01-15 HISTORY — PX: INTERCOSTAL NERVE BLOCK: SHX5021

## 2024-01-15 LAB — BASIC METABOLIC PANEL WITH GFR
Anion gap: 8 (ref 5–15)
BUN: 13 mg/dL (ref 8–23)
CO2: 23 mmol/L (ref 22–32)
Calcium: 8.5 mg/dL — ABNORMAL LOW (ref 8.9–10.3)
Chloride: 105 mmol/L (ref 98–111)
Creatinine, Ser: 0.97 mg/dL (ref 0.61–1.24)
GFR, Estimated: >60 mL/min (ref >60)
Glucose, Bld: 122 mg/dL — ABNORMAL HIGH (ref 70–99)
Potassium: 3.7 mmol/L (ref 3.5–5.1)
Sodium: 136 mmol/L (ref 135–145)

## 2024-01-15 LAB — CBC
HCT: 32.9 % — ABNORMAL LOW (ref 39.0–52.0)
Hemoglobin: 11.1 g/dL — ABNORMAL LOW (ref 13.0–17.0)
MCH: 33.2 pg (ref 26.0–34.0)
MCHC: 33.7 g/dL (ref 30.0–36.0)
MCV: 98.5 fL (ref 80.0–100.0)
Platelets: 179 K/uL (ref 150–400)
RBC: 3.34 MIL/uL — ABNORMAL LOW (ref 4.22–5.81)
RDW: 12.2 % (ref 11.5–15.5)
WBC: 15.1 K/uL — ABNORMAL HIGH (ref 4.0–10.5)
nRBC: 0 % (ref 0.0–0.2)

## 2024-01-15 LAB — POCT I-STAT, CHEM 8
BUN: 15 mg/dL (ref 8–23)
Calcium, Ion: 1.23 mmol/L (ref 1.15–1.40)
Chloride: 101 mmol/L (ref 98–111)
Creatinine, Ser: 0.9 mg/dL (ref 0.61–1.24)
Glucose, Bld: 191 mg/dL — ABNORMAL HIGH (ref 70–99)
HCT: 34 % — ABNORMAL LOW (ref 39.0–52.0)
Hemoglobin: 11.6 g/dL — ABNORMAL LOW (ref 13.0–17.0)
Potassium: 4.2 mmol/L (ref 3.5–5.1)
Sodium: 136 mmol/L (ref 135–145)
TCO2: 25 mmol/L (ref 22–32)

## 2024-01-15 LAB — POCT I-STAT 7, (LYTES, BLD GAS, ICA,H+H)
Acid-base deficit: 2 mmol/L (ref 0.0–2.0)
Bicarbonate: 25.4 mmol/L (ref 20.0–28.0)
Calcium, Ion: 1.25 mmol/L (ref 1.15–1.40)
HCT: 34 % — ABNORMAL LOW (ref 39.0–52.0)
Hemoglobin: 11.6 g/dL — ABNORMAL LOW (ref 13.0–17.0)
O2 Saturation: 94 %
Patient temperature: 36.2
Potassium: 4.1 mmol/L (ref 3.5–5.1)
Sodium: 136 mmol/L (ref 135–145)
TCO2: 27 mmol/L (ref 22–32)
pCO2 arterial: 50.3 mmHg — ABNORMAL HIGH (ref 32–48)
pH, Arterial: 7.307 — ABNORMAL LOW (ref 7.35–7.45)
pO2, Arterial: 77 mmHg — ABNORMAL LOW (ref 83–108)

## 2024-01-15 LAB — GLUCOSE, CAPILLARY
Glucose-Capillary: 95 mg/dL (ref 70–99)
Glucose-Capillary: 169 mg/dL — ABNORMAL HIGH (ref 70–99)
Glucose-Capillary: 128 mg/dL — ABNORMAL HIGH (ref 70–99)
Glucose-Capillary: 99 mg/dL (ref 70–99)
Glucose-Capillary: 122 mg/dL — ABNORMAL HIGH (ref 70–99)
Glucose-Capillary: 123 mg/dL — ABNORMAL HIGH (ref 70–99)

## 2024-01-15 LAB — ABO/RH: ABO/RH(D): A POS

## 2024-01-15 SURGERY — GASTRECTOMY, TOTAL
Anesthesia: General | Site: Chest | Laterality: Right

## 2024-01-15 MED ORDER — DEXAMETHASONE SODIUM PHOSPHATE 10 MG/ML IJ SOLN
INTRAMUSCULAR | Status: AC
  Filled 2024-01-15: qty 1

## 2024-01-15 MED ORDER — BUPIVACAINE HCL (PF) 0.25 % IJ SOLN
INTRAMUSCULAR | Status: AC
  Filled 2024-01-15: qty 30

## 2024-01-15 MED ORDER — LIDOCAINE 2% (20 MG/ML) 5 ML SYRINGE
INTRAMUSCULAR | Status: AC
  Filled 2024-01-15: qty 10

## 2024-01-15 MED ORDER — 0.9 % SODIUM CHLORIDE (POUR BTL) OPTIME
TOPICAL | Status: DC | PRN
  Administered 2024-01-15: 2000 mL
  Administered 2024-01-15 (×2): 1000 mL

## 2024-01-15 MED ORDER — SODIUM CHLORIDE (PF) 0.9 % IJ SOLN
INTRAMUSCULAR | Status: AC
  Filled 2024-01-15: qty 10

## 2024-01-15 MED ORDER — ENSURE PRE-SURGERY PO LIQD: 592.0000 mL | Freq: Once | ORAL | Status: DC

## 2024-01-15 MED ORDER — VECURONIUM BROMIDE 10 MG IV SOLR
INTRAVENOUS | Status: DC | PRN
Start: 2024-01-15 — End: 2024-01-15
  Administered 2024-01-15 (×4): 2 mg via INTRAVENOUS
  Administered 2024-01-15: 1 mg via INTRAVENOUS
  Administered 2024-01-15: 2 mg via INTRAVENOUS
  Administered 2024-01-15 (×3): 1 mg via INTRAVENOUS
  Administered 2024-01-15: 6 mg via INTRAVENOUS

## 2024-01-15 MED ORDER — INSULIN ASPART 100 UNIT/ML IJ SOLN: 0.0000 [IU] | INTRAMUSCULAR | Status: DC | PRN

## 2024-01-15 MED ORDER — ONDANSETRON HCL 4 MG/2ML IJ SOLN
4.0000 mg | Freq: Four times a day (QID) | INTRAMUSCULAR | Status: DC | PRN
  Administered 2024-01-27: 4 mg via INTRAVENOUS
  Filled 2024-01-15: qty 2

## 2024-01-15 MED ORDER — PHENYLEPHRINE HCL-NACL 20-0.9 MG/250ML-% IV SOLN
INTRAVENOUS | Status: DC | PRN
  Administered 2024-01-15: 40 ug/min via INTRAVENOUS

## 2024-01-15 MED ORDER — ALBUMIN HUMAN 5 % IV SOLN: INTRAVENOUS | Status: DC | PRN

## 2024-01-15 MED ORDER — VASOPRESSIN 20 UNIT/ML IV SOLN
INTRAVENOUS | Status: DC | PRN
  Administered 2024-01-15 (×6): 1 [IU] via INTRAVENOUS

## 2024-01-15 MED ORDER — SODIUM CHLORIDE FLUSH 0.9 % IV SOLN: INTRAVENOUS | Status: DC | PRN

## 2024-01-15 MED ORDER — CEFAZOLIN SODIUM 1 G IJ SOLR
INTRAMUSCULAR | Status: AC
  Filled 2024-01-15: qty 20

## 2024-01-15 MED ORDER — SODIUM CHLORIDE (PF) 0.9 % IJ SOLN
INTRAMUSCULAR | Status: AC
  Filled 2024-01-15: qty 50

## 2024-01-15 MED ORDER — SUGAMMADEX SODIUM 200 MG/2ML IV SOLN
INTRAVENOUS | Status: DC | PRN
  Administered 2024-01-15: 200 mg via INTRAVENOUS

## 2024-01-15 MED ORDER — LACTATED RINGERS IV SOLN
INTRAVENOUS | Status: DC | PRN
Start: 2024-01-15 — End: 2024-01-15

## 2024-01-15 MED ORDER — DIPHENHYDRAMINE HCL 50 MG/ML IJ SOLN
12.5000 mg | Freq: Four times a day (QID) | INTRAMUSCULAR | Status: DC | PRN
  Administered 2024-01-22 – 2024-02-01 (×4): 12.5 mg via INTRAVENOUS
  Filled 2024-01-15 (×4): qty 1

## 2024-01-15 MED ORDER — LIDOCAINE HCL (CARDIAC) PF 100 MG/5ML IV SOSY
PREFILLED_SYRINGE | INTRAVENOUS | Status: DC | PRN
  Administered 2024-01-15: 100 mg via INTRATRACHEAL

## 2024-01-15 MED ORDER — ALBUTEROL SULFATE (2.5 MG/3ML) 0.083% IN NEBU: 2.5000 mg | INHALATION_SOLUTION | Freq: Four times a day (QID) | RESPIRATORY_TRACT | Status: DC | PRN

## 2024-01-15 MED ORDER — CEFAZOLIN SODIUM-DEXTROSE 2-4 GM/100ML-% IV SOLN
2.0000 g | INTRAVENOUS | Status: AC
  Administered 2024-01-15 (×3): 2 g via INTRAVENOUS
  Filled 2024-01-15: qty 100

## 2024-01-15 MED ORDER — FENTANYL CITRATE (PF) 250 MCG/5ML IJ SOLN
INTRAMUSCULAR | Status: AC
  Filled 2024-01-15: qty 5

## 2024-01-15 MED ORDER — ACETAMINOPHEN 10 MG/ML IV SOLN
INTRAVENOUS | Status: AC
  Filled 2024-01-15: qty 100

## 2024-01-15 MED ORDER — ARTIFICIAL TEARS OPHTHALMIC OINT
TOPICAL_OINTMENT | OPHTHALMIC | Status: DC | PRN
  Administered 2024-01-15: 1 via OPHTHALMIC

## 2024-01-15 MED ORDER — ROCURONIUM BROMIDE 100 MG/10ML IV SOLN
INTRAVENOUS | Status: DC | PRN
Start: 2024-01-15 — End: 2024-01-15
  Administered 2024-01-15: 20 mg via INTRAVENOUS

## 2024-01-15 MED ORDER — ONDANSETRON HCL 4 MG/2ML IJ SOLN: 4.0000 mg | Freq: Four times a day (QID) | INTRAMUSCULAR | Status: DC | PRN

## 2024-01-15 MED ORDER — VECURONIUM BROMIDE 10 MG IV SOLR
INTRAVENOUS | Status: AC
  Filled 2024-01-15: qty 10

## 2024-01-15 MED ORDER — ARTIFICIAL TEARS OPHTHALMIC OINT
TOPICAL_OINTMENT | OPHTHALMIC | Status: AC
  Filled 2024-01-15: qty 3.5

## 2024-01-15 MED ORDER — DEXAMETHASONE SODIUM PHOSPHATE 10 MG/ML IJ SOLN
INTRAMUSCULAR | Status: DC | PRN
  Administered 2024-01-15: 10 mg via INTRAVENOUS

## 2024-01-15 MED ORDER — FENTANYL CITRATE (PF) 100 MCG/2ML IJ SOLN: 25.0000 ug | INTRAMUSCULAR | Status: DC | PRN

## 2024-01-15 MED ORDER — ROCURONIUM BROMIDE 10 MG/ML (PF) SYRINGE
PREFILLED_SYRINGE | INTRAVENOUS | Status: AC
Start: 2024-01-15 — End: 2024-01-15
  Filled 2024-01-15: qty 10

## 2024-01-15 MED ORDER — LACTATED RINGERS IV SOLN: INTRAVENOUS | Status: DC

## 2024-01-15 MED ORDER — CHLORHEXIDINE GLUCONATE 0.12 % MT SOLN: 15.0000 mL | Freq: Once | OROMUCOSAL | Status: AC

## 2024-01-15 MED ORDER — DEXMEDETOMIDINE HCL IN NACL 80 MCG/20ML IV SOLN
INTRAVENOUS | Status: AC
  Filled 2024-01-15: qty 20

## 2024-01-15 MED ORDER — INSULIN REGULAR(HUMAN) IN NACL 100-0.9 UT/100ML-% IV SOLN
INTRAVENOUS | Status: DC
  Administered 2024-01-15: 8.5 [IU]/h via INTRAVENOUS
  Filled 2024-01-15: qty 100

## 2024-01-15 MED ORDER — PROPOFOL 10 MG/ML IV BOLUS
INTRAVENOUS | Status: AC
  Filled 2024-01-15: qty 20

## 2024-01-15 MED ORDER — EPHEDRINE 5 MG/ML INJ
INTRAVENOUS | Status: AC
  Filled 2024-01-15: qty 5

## 2024-01-15 MED ORDER — ORAL CARE MOUTH RINSE: 15.0000 mL | Freq: Once | OROMUCOSAL | Status: AC

## 2024-01-15 MED ORDER — VASOPRESSIN 20 UNIT/ML IV SOLN
INTRAVENOUS | Status: AC
Start: 2024-01-15 — End: 2024-01-15
  Filled 2024-01-15: qty 1

## 2024-01-15 MED ORDER — SODIUM CHLORIDE 0.9% FLUSH: 9.0000 mL | INTRAVENOUS | Status: DC | PRN

## 2024-01-15 MED ORDER — OXYCODONE HCL 5 MG PO TABS: 5.0000 mg | ORAL_TABLET | Freq: Once | ORAL | Status: DC | PRN

## 2024-01-15 MED ORDER — DEXMEDETOMIDINE HCL IN NACL 80 MCG/20ML IV SOLN
INTRAVENOUS | Status: DC | PRN
  Administered 2024-01-15: 40 ug via INTRAVENOUS

## 2024-01-15 MED ORDER — PROPOFOL 10 MG/ML IV BOLUS
INTRAVENOUS | Status: DC | PRN
  Administered 2024-01-15: 20 mg via INTRAVENOUS
  Administered 2024-01-15: 30 mg via INTRAVENOUS
  Administered 2024-01-15: 150 mg via INTRAVENOUS
  Administered 2024-01-15: 50 mg via INTRAVENOUS

## 2024-01-15 MED ORDER — EPHEDRINE SULFATE-NACL 50-0.9 MG/10ML-% IV SOSY
PREFILLED_SYRINGE | INTRAVENOUS | Status: DC | PRN
  Administered 2024-01-15 (×2): 10 mg via INTRAVENOUS
  Administered 2024-01-15: 5 mg via INTRAVENOUS

## 2024-01-15 MED ORDER — ENSURE PRE-SURGERY PO LIQD: 296.0000 mL | Freq: Once | ORAL | Status: DC

## 2024-01-15 MED ORDER — ENOXAPARIN SODIUM 40 MG/0.4ML IJ SOSY
40.0000 mg | PREFILLED_SYRINGE | INTRAMUSCULAR | Status: DC
  Administered 2024-01-16 – 2024-01-18 (×3): 40 mg via SUBCUTANEOUS
  Filled 2024-01-15 (×3): qty 0.4

## 2024-01-15 MED ORDER — LACTATED RINGERS IV SOLN: INTRAVENOUS | Status: AC

## 2024-01-15 MED ORDER — SODIUM CHLORIDE 0.9 % IV SOLN: INTRAVENOUS | Status: DC | PRN

## 2024-01-15 MED ORDER — KETAMINE HCL 50 MG/5ML IJ SOSY
PREFILLED_SYRINGE | INTRAMUSCULAR | Status: AC
  Filled 2024-01-15: qty 5

## 2024-01-15 MED ORDER — FENTANYL CITRATE (PF) 250 MCG/5ML IJ SOLN
INTRAMUSCULAR | Status: DC | PRN
  Administered 2024-01-15 (×4): 50 ug via INTRAVENOUS
  Administered 2024-01-15 (×2): 100 ug via INTRAVENOUS
  Administered 2024-01-15: 50 ug via INTRAVENOUS
  Administered 2024-01-15: 100 ug via INTRAVENOUS

## 2024-01-15 MED ORDER — MIDAZOLAM HCL 2 MG/2ML IJ SOLN
INTRAMUSCULAR | Status: AC
  Filled 2024-01-15: qty 2

## 2024-01-15 MED ORDER — PHENYLEPHRINE 80 MCG/ML (10ML) SYRINGE FOR IV PUSH (FOR BLOOD PRESSURE SUPPORT)
PREFILLED_SYRINGE | INTRAVENOUS | Status: AC
  Filled 2024-01-15: qty 10

## 2024-01-15 MED ORDER — SUCCINYLCHOLINE CHLORIDE 200 MG/10ML IV SOSY
PREFILLED_SYRINGE | INTRAVENOUS | Status: AC
  Filled 2024-01-15: qty 10

## 2024-01-15 MED ORDER — METRONIDAZOLE 500 MG/100ML IV SOLN
500.0000 mg | INTRAVENOUS | Status: AC
  Administered 2024-01-15: 500 mg via INTRAVENOUS
  Filled 2024-01-15: qty 100

## 2024-01-15 MED ORDER — BUPIVACAINE HCL (PF) 0.5 % IJ SOLN
INTRAMUSCULAR | Status: AC
  Filled 2024-01-15: qty 30

## 2024-01-15 MED ORDER — METOPROLOL TARTRATE 5 MG/5ML IV SOLN
5.0000 mg | Freq: Three times a day (TID) | INTRAVENOUS | Status: DC
  Administered 2024-01-15 – 2024-01-20 (×12): 5 mg via INTRAVENOUS
  Filled 2024-01-15 (×14): qty 5

## 2024-01-15 MED ORDER — FENTANYL CITRATE (PF) 100 MCG/2ML IJ SOLN
INTRAMUSCULAR | Status: AC
  Filled 2024-01-15: qty 2

## 2024-01-15 MED ORDER — LIDOCAINE 2% (20 MG/ML) 5 ML SYRINGE
INTRAMUSCULAR | Status: DC | PRN
  Administered 2024-01-15: 60 mg via INTRAVENOUS

## 2024-01-15 MED ORDER — PHENYLEPHRINE HCL-NACL 20-0.9 MG/250ML-% IV SOLN
INTRAVENOUS | Status: AC
  Filled 2024-01-15: qty 250

## 2024-01-15 MED ORDER — OXYCODONE HCL 5 MG/5ML PO SOLN: 5.0000 mg | Freq: Once | ORAL | Status: DC | PRN

## 2024-01-15 MED ORDER — NALOXONE HCL 0.4 MG/ML IJ SOLN: 0.4000 mg | INTRAMUSCULAR | Status: DC | PRN

## 2024-01-15 MED ORDER — ACETAMINOPHEN 10 MG/ML IV SOLN
INTRAVENOUS | Status: DC | PRN
  Administered 2024-01-15: 1000 mg via INTRAVENOUS

## 2024-01-15 MED ORDER — BUPIVACAINE LIPOSOME 1.3 % IJ SUSP
INTRAMUSCULAR | Status: AC
  Filled 2024-01-15: qty 20

## 2024-01-15 MED ORDER — ACETAMINOPHEN 10 MG/ML IV SOLN
1000.0000 mg | Freq: Three times a day (TID) | INTRAVENOUS | Status: AC
  Administered 2024-01-15 – 2024-01-16 (×3): 1000 mg via INTRAVENOUS
  Filled 2024-01-15 (×3): qty 100

## 2024-01-15 MED ORDER — INSULIN ASPART 100 UNIT/ML IJ SOLN
0.0000 [IU] | INTRAMUSCULAR | Status: DC
  Administered 2024-01-15 – 2024-01-19 (×16): 2 [IU] via SUBCUTANEOUS
  Administered 2024-01-19: 3 [IU] via SUBCUTANEOUS
  Administered 2024-01-19 – 2024-01-20 (×6): 2 [IU] via SUBCUTANEOUS
  Administered 2024-01-20 – 2024-01-21 (×4): 3 [IU] via SUBCUTANEOUS
  Administered 2024-01-21: 2 [IU] via SUBCUTANEOUS
  Administered 2024-01-21: 3 [IU] via SUBCUTANEOUS
  Administered 2024-01-22: 2 [IU] via SUBCUTANEOUS
  Administered 2024-01-22 (×3): 3 [IU] via SUBCUTANEOUS
  Administered 2024-01-22 (×2): 2 [IU] via SUBCUTANEOUS
  Administered 2024-01-23: 3 [IU] via SUBCUTANEOUS
  Administered 2024-01-23 (×2): 2 [IU] via SUBCUTANEOUS
  Administered 2024-01-23 – 2024-01-24 (×4): 3 [IU] via SUBCUTANEOUS
  Administered 2024-01-24: 1 [IU] via SUBCUTANEOUS
  Administered 2024-01-24: 3 [IU] via SUBCUTANEOUS
  Administered 2024-01-24 (×2): 2 [IU] via SUBCUTANEOUS
  Administered 2024-01-24: 3 [IU] via SUBCUTANEOUS
  Administered 2024-01-25: 2 [IU] via SUBCUTANEOUS
  Administered 2024-01-25: 3 [IU] via SUBCUTANEOUS
  Administered 2024-01-25: 2 [IU] via SUBCUTANEOUS
  Administered 2024-01-25 – 2024-01-26 (×2): 3 [IU] via SUBCUTANEOUS
  Administered 2024-01-26 (×2): 2 [IU] via SUBCUTANEOUS
  Administered 2024-01-26 (×3): 3 [IU] via SUBCUTANEOUS
  Administered 2024-01-26 – 2024-01-27 (×3): 2 [IU] via SUBCUTANEOUS
  Administered 2024-01-27: 3 [IU] via SUBCUTANEOUS
  Administered 2024-01-27: 2 [IU] via SUBCUTANEOUS
  Administered 2024-01-27 – 2024-01-28 (×2): 3 [IU] via SUBCUTANEOUS
  Administered 2024-01-28: 2 [IU] via SUBCUTANEOUS
  Administered 2024-01-28 (×2): 3 [IU] via SUBCUTANEOUS
  Administered 2024-01-29: 5 [IU] via SUBCUTANEOUS
  Administered 2024-01-29 (×3): 3 [IU] via SUBCUTANEOUS
  Administered 2024-01-29: 2 [IU] via SUBCUTANEOUS
  Administered 2024-01-29 – 2024-01-31 (×9): 3 [IU] via SUBCUTANEOUS
  Administered 2024-01-31: 2 [IU] via SUBCUTANEOUS
  Administered 2024-01-31 (×2): 3 [IU] via SUBCUTANEOUS
  Administered 2024-01-31: 2 [IU] via SUBCUTANEOUS
  Administered 2024-02-01: 3 [IU] via SUBCUTANEOUS
  Administered 2024-02-01: 5 [IU] via SUBCUTANEOUS
  Administered 2024-02-01: 3 [IU] via SUBCUTANEOUS
  Administered 2024-02-01: 5 [IU] via SUBCUTANEOUS
  Administered 2024-02-01: 3 [IU] via SUBCUTANEOUS
  Administered 2024-02-01: 2 [IU] via SUBCUTANEOUS
  Administered 2024-02-02 (×2): 3 [IU] via SUBCUTANEOUS
  Administered 2024-02-02: 5 [IU] via SUBCUTANEOUS
  Administered 2024-02-02 (×2): 3 [IU] via SUBCUTANEOUS
  Administered 2024-02-03: 2 [IU] via SUBCUTANEOUS
  Administered 2024-02-03: 3 [IU] via SUBCUTANEOUS
  Administered 2024-02-03 (×3): 2 [IU] via SUBCUTANEOUS
  Administered 2024-02-04: 3 [IU] via SUBCUTANEOUS
  Administered 2024-02-04 (×3): 2 [IU] via SUBCUTANEOUS
  Administered 2024-02-04: 3 [IU] via SUBCUTANEOUS
  Administered 2024-02-04 – 2024-02-05 (×3): 2 [IU] via SUBCUTANEOUS
  Administered 2024-02-05: 3 [IU] via SUBCUTANEOUS
  Administered 2024-02-05: 2 [IU] via SUBCUTANEOUS
  Administered 2024-02-05: 3 [IU] via SUBCUTANEOUS
  Administered 2024-02-06 (×2): 2 [IU] via SUBCUTANEOUS
  Administered 2024-02-06: 3 [IU] via SUBCUTANEOUS
  Administered 2024-02-06: 2 [IU] via SUBCUTANEOUS
  Administered 2024-02-06 – 2024-02-07 (×2): 3 [IU] via SUBCUTANEOUS
  Administered 2024-02-07: 2 [IU] via SUBCUTANEOUS
  Administered 2024-02-07: 3 [IU] via SUBCUTANEOUS

## 2024-01-15 MED ORDER — PHENYLEPHRINE 80 MCG/ML (10ML) SYRINGE FOR IV PUSH (FOR BLOOD PRESSURE SUPPORT)
PREFILLED_SYRINGE | INTRAVENOUS | Status: DC | PRN
  Administered 2024-01-15 (×2): 160 ug via INTRAVENOUS
  Administered 2024-01-15: 80 ug via INTRAVENOUS

## 2024-01-15 MED ORDER — ONDANSETRON HCL 4 MG/2ML IJ SOLN
INTRAMUSCULAR | Status: AC
  Filled 2024-01-15: qty 2

## 2024-01-15 MED ORDER — MORPHINE SULFATE 1 MG/ML IV SOLN PCA
INTRAVENOUS | Status: DC
  Administered 2024-01-15 – 2024-01-16 (×2): 1 mg via INTRAVENOUS
  Filled 2024-01-15: qty 30

## 2024-01-15 MED ORDER — DEXMEDETOMIDINE HCL IN NACL 400 MCG/100ML IV SOLN
INTRAVENOUS | Status: DC | PRN
  Administered 2024-01-15: .4 ug/kg/h via INTRAVENOUS

## 2024-01-15 MED ORDER — METHOCARBAMOL 1000 MG/10ML IJ SOLN
500.0000 mg | Freq: Three times a day (TID) | INTRAMUSCULAR | Status: DC
  Administered 2024-01-15 – 2024-01-16 (×2): 500 mg via INTRAVENOUS
  Filled 2024-01-15 (×2): qty 10

## 2024-01-15 MED ORDER — KETAMINE HCL 10 MG/ML IJ SOLN
INTRAMUSCULAR | Status: DC | PRN
Start: 2024-01-15 — End: 2024-01-15
  Administered 2024-01-15: 20 mg via INTRAVENOUS

## 2024-01-15 MED ORDER — CHLORHEXIDINE GLUCONATE 0.12 % MT SOLN
OROMUCOSAL | Status: AC
  Administered 2024-01-15: 15 mL via OROMUCOSAL
  Filled 2024-01-15: qty 15

## 2024-01-15 MED ORDER — ONDANSETRON HCL 4 MG/2ML IJ SOLN
INTRAMUSCULAR | Status: DC | PRN
  Administered 2024-01-15: 4 mg via INTRAVENOUS

## 2024-01-15 MED ORDER — STERILE WATER FOR IRRIGATION IR SOLN
Status: DC | PRN
Start: 2024-01-15 — End: 2024-01-15
  Administered 2024-01-15: 1000 mL

## 2024-01-15 MED ORDER — CEFAZOLIN SODIUM-DEXTROSE 2-4 GM/100ML-% IV SOLN: 2.0000 g | INTRAVENOUS | Status: DC

## 2024-01-15 SURGICAL SUPPLY — 148 items
BIOPATCH RED 1 DISK 7.0 (GAUZE/BANDAGES/DRESSINGS) IMPLANT
BLADE CLIPPER SURG (BLADE) IMPLANT
BLADE SURG 11 STRL SS (BLADE) ×2 IMPLANT
CANISTER SUCTION 3000ML PPV (SUCTIONS) ×6 IMPLANT
CANNULA REDUCER 12-8 DVNC XI (CANNULA) ×2 IMPLANT
CATH THORACIC 28FR (CATHETERS) IMPLANT
CAUTERY SPATULA MNPLR 1.7 DVNC (INSTRUMENTS) IMPLANT
CHLORAPREP W/TINT 26 (MISCELLANEOUS) ×6 IMPLANT
CLIP TI MEDIUM 24 (CLIP) IMPLANT
CLIP TI WIDE RED SMALL 24 (CLIP) ×2 IMPLANT
CNTNR URN SCR LID CUP LEK RST (MISCELLANEOUS) IMPLANT
CONN ST 1/4X3/8 BEN (MISCELLANEOUS) ×2 IMPLANT
CONT SPEC 4OZ CLIKSEAL STRL BL (MISCELLANEOUS) ×4 IMPLANT
COVER SURGICAL LIGHT HANDLE (MISCELLANEOUS) ×2 IMPLANT
COVER TIP SHEARS 8 DVNC (MISCELLANEOUS) IMPLANT
CUTTER FLEX LINEAR 45M (STAPLE) IMPLANT
DEFOGGER SCOPE WARM SEASHARP (MISCELLANEOUS) ×2 IMPLANT
DERMABOND ADVANCED .7 DNX12 (GAUZE/BANDAGES/DRESSINGS) ×4 IMPLANT
DEVICE SUTURE ENDOST 10MM (ENDOMECHANICALS) IMPLANT
DRAIN CHANNEL 19F RND (DRAIN) IMPLANT
DRAIN CHANNEL 28F RND 3/8 FF (WOUND CARE) IMPLANT
DRAIN CONNECTOR BLAKE 1:1 (MISCELLANEOUS) IMPLANT
DRAIN PENROSE .5X12 LATEX STL (DRAIN) IMPLANT
DRAIN PENROSE 0.5X18 (DRAIN) IMPLANT
DRAPE ARM DVNC X/XI (DISPOSABLE) ×8 IMPLANT
DRAPE COLUMN DVNC XI (DISPOSABLE) ×2 IMPLANT
DRAPE CV SPLIT W-CLR ANES SCRN (DRAPES) ×4 IMPLANT
DRAPE INCISE IOBAN 66X45 STRL (DRAPES) ×6 IMPLANT
DRAPE SURG ORHT 6 SPLT 77X108 (DRAPES) ×4 IMPLANT
DRAPE WARM FLUID 44X44 (DRAPES) ×2 IMPLANT
DRIVER NDL MEGA 8 DVNC XI (INSTRUMENTS) IMPLANT
DRIVER NDLE MEGA DVNC XI (INSTRUMENTS) IMPLANT
DRSG TEGADERM 4X4.75 (GAUZE/BANDAGES/DRESSINGS) IMPLANT
ELECT BLADE 6.5 EXT (BLADE) ×2 IMPLANT
ELECT PAD DSPR THERM+ ADLT (MISCELLANEOUS) ×2 IMPLANT
ELECTRODE BLDE 4.0 EZ CLN MEGD (MISCELLANEOUS) IMPLANT
ELECTRODE REM PT RTRN 9FT ADLT (ELECTROSURGICAL) ×4 IMPLANT
EVACUATOR SILICONE 100CC (DRAIN) IMPLANT
FELT TEFLON 1X6 (MISCELLANEOUS) IMPLANT
FORCEPS BPLR FENES DVNC XI (FORCEP) IMPLANT
FORCEPS BPLR LNG DVNC XI (INSTRUMENTS) IMPLANT
FORCEPS CADIERE DVNC XI (FORCEP) IMPLANT
GAUZE KITTNER 4X5 RF (MISCELLANEOUS) ×4 IMPLANT
GAUZE SPONGE 2X2 STRL 8-PLY (GAUZE/BANDAGES/DRESSINGS) IMPLANT
GAUZE SPONGE 4X4 12PLY STRL (GAUZE/BANDAGES/DRESSINGS) ×2 IMPLANT
GLOVE BIO SURGEON STRL SZ7.5 (GLOVE) ×8 IMPLANT
GLOVE BIOGEL PI IND STRL 6 (GLOVE) ×2 IMPLANT
GLOVE BIOGEL PI MICRO STRL 5.5 (GLOVE) ×2 IMPLANT
GOWN STRL REUS W/ TWL LRG LVL3 (GOWN DISPOSABLE) ×8 IMPLANT
GOWN STRL REUS W/ TWL XL LVL3 (GOWN DISPOSABLE) ×4 IMPLANT
GOWN STRL SURGICAL XL XLNG (GOWN DISPOSABLE) ×4 IMPLANT
GRASPER ENDOPATH ANVIL 10MM (MISCELLANEOUS) IMPLANT
GRASPER SUT TROCAR 14GX15 (MISCELLANEOUS) ×2 IMPLANT
GRASPER TIP-UP FEN DVNC XI (INSTRUMENTS) IMPLANT
HAND PENCIL TRP OPTION (MISCELLANEOUS) ×2 IMPLANT
HANDLE SUCTION POOLE (INSTRUMENTS) ×2 IMPLANT
HEMOSTAT SURGICEL 2X14 (HEMOSTASIS) IMPLANT
IRRIGATION SUCT STRKRFLW 2 WTP (MISCELLANEOUS) IMPLANT
IRRIGATOR SUCT 8 DISP DVNC XI (IRRIGATION / IRRIGATOR) IMPLANT
IV NS 1000ML BAXH (IV SOLUTION) IMPLANT
J-TUBE MIC 14FX51 UNV ENFIT (TUBING) IMPLANT
J-TUBE MIC 16FX51 UNV ENFIT (TUBING) IMPLANT
J-TUBE MIC 18FX51 UNV ENFIT (TUBING) IMPLANT
KIT BASIN OR (CUSTOM PROCEDURE TRAY) ×4 IMPLANT
KIT DILATOR VASC 18G NDL (KITS) IMPLANT
KIT TURNOVER KIT B (KITS) ×4 IMPLANT
LIGASURE IMPACT 36 18CM CVD LR (INSTRUMENTS) IMPLANT
MARKER SKIN DUAL TIP RULER LAB (MISCELLANEOUS) IMPLANT
NDL DRIVE SUT CUT DVNC (INSTRUMENTS) IMPLANT
NDL INSUFFLATION 14GA 120MM (NEEDLE) ×2 IMPLANT
NEEDLE DRIVE SUT CUT DVNC (INSTRUMENTS) ×2 IMPLANT
NEEDLE INSUFFLATION 14GA 120MM (NEEDLE) ×2 IMPLANT
NS IRRIG 1000ML POUR BTL (IV SOLUTION) ×8 IMPLANT
PACK CHEST (CUSTOM PROCEDURE TRAY) ×2 IMPLANT
PACK GENERAL/GYN (CUSTOM PROCEDURE TRAY) ×2 IMPLANT
PAD ARMBOARD POSITIONER FOAM (MISCELLANEOUS) ×8 IMPLANT
PORT ACCESS TROCAR AIRSEAL 12 (TROCAR) IMPLANT
RELOAD STAPLE 45 3.5 BLU DVNC (STAPLE) IMPLANT
RELOAD STAPLE 45 3.5 BLU ETS (ENDOMECHANICALS) IMPLANT
RELOAD STAPLE 45 3.6 BLU REG (STAPLE) IMPLANT
RELOAD STAPLE 60 BLU REG PROX (ENDOMECHANICALS) IMPLANT
RELOAD STAPLE 75 3.8 BLU REG (ENDOMECHANICALS) IMPLANT
RELOAD STAPLE 75 4.5 GRN THCK (ENDOMECHANICALS) IMPLANT
RETRACTOR WOUND ALXS 19CM XSML (INSTRUMENTS) ×2 IMPLANT
RETRACTOR WOUND ALXS 34CM XLRG (MISCELLANEOUS) ×2 IMPLANT
SCISSORS LAP 5X35 DISP (ENDOMECHANICALS) IMPLANT
SCISSORS MNPLR CVD DVNC XI (INSTRUMENTS) IMPLANT
SEAL UNIV 5-12 XI (MISCELLANEOUS) ×10 IMPLANT
SEALER SYNCHRO 8 IS4000 DVNC (MISCELLANEOUS) ×2 IMPLANT
SEALER VESSEL EXT DVNC XI (MISCELLANEOUS) IMPLANT
SET GASTRO INIT PLACEMENT MED (SET/KITS/TRAYS/PACK) ×2 IMPLANT
SET TRI-LUMEN FLTR TB AIRSEAL (TUBING) ×2 IMPLANT
SET TUBE SMOKE EVAC HIGH FLOW (TUBING) ×2 IMPLANT
SHEARS FOC LG CVD HARMONIC 17C (MISCELLANEOUS) IMPLANT
SLEEVE Z-THREAD 5X100MM (TROCAR) ×2 IMPLANT
SOLUTION ELECTROSURG ANTI STCK (MISCELLANEOUS) IMPLANT
SPONGE T-LAP 18X18 ~~LOC~~+RFID (SPONGE) IMPLANT
STAPLE ECHEON FLEX 60 POW ENDO (STAPLE) IMPLANT
STAPLER 45 SUREFORM DVNC (STAPLE) IMPLANT
STAPLER CIRC 25MM 4.8MM THK (STAPLE) IMPLANT
STAPLER GUN LINEAR PROX 60 (STAPLE) IMPLANT
STAPLER PROXIMATE 75MM BLUE (STAPLE) IMPLANT
STAPLER SKIN PROX 35W (STAPLE) ×2 IMPLANT
STAPLER TRANS-ORAL 25MM EEA (STAPLE) IMPLANT
STOPCOCK 4 WAY LG BORE MALE ST (IV SETS) ×2 IMPLANT
SUT ETHIBOND 0 36 GRN (SUTURE) IMPLANT
SUT ETHILON 2 0 FS 18 (SUTURE) IMPLANT
SUT ETHILON 2 0 PSLX (SUTURE) IMPLANT
SUT MNCRL AB 4-0 PS2 18 (SUTURE) ×2 IMPLANT
SUT NOVA T20/GS 25 (SUTURE) IMPLANT
SUT PDS AB 1 TP1 96 (SUTURE) ×4 IMPLANT
SUT PDS AB 3-0 SH 27 (SUTURE) IMPLANT
SUT PROLENE 3 0 SH 48 (SUTURE) IMPLANT
SUT PROLENE 4-0 RB1 .5 CRCL 36 (SUTURE) ×2 IMPLANT
SUT SILK 1 MH (SUTURE) ×2 IMPLANT
SUT SILK 2 0 SH (SUTURE) ×2 IMPLANT
SUT SILK 2 0 TIES 10X30 (SUTURE) ×2 IMPLANT
SUT SILK 2 0SH CR/8 30 (SUTURE) ×2 IMPLANT
SUT SILK 3 0 TIES 10X30 (SUTURE) ×2 IMPLANT
SUT SILK 3 0SH CR/8 30 (SUTURE) ×2 IMPLANT
SUT STRATA 2-0 15 CT-1.5 (SUTURE) IMPLANT
SUT SURGIDAC NAB ES-9 0 48 120 (SUTURE) ×4 IMPLANT
SUT VIC AB 2-0 CT1 36 (SUTURE) IMPLANT
SUT VIC AB 2-0 CT1 TAPERPNT 27 (SUTURE) IMPLANT
SUT VIC AB 2-0 SH 27XBRD (SUTURE) ×2 IMPLANT
SUT VIC AB 3-0 MH 27 (SUTURE) IMPLANT
SUT VIC AB 3-0 SH 27X BRD (SUTURE) ×12 IMPLANT
SUT VIC AB 3-0 SH 8-18 (SUTURE) ×2 IMPLANT
SUT VICRYL 0 UR6 27IN ABS (SUTURE) ×8 IMPLANT
SUT VLOC 180 0 9IN GS21 (SUTURE) ×2 IMPLANT
SUT VLOC 180 2-0 9IN GS21 (SUTURE) IMPLANT
SUTURE V-LC BRB 180 2/0GR6GS22 (SUTURE) IMPLANT
SYR 10ML LL (SYRINGE) ×2 IMPLANT
SYR 20ML LL LF (SYRINGE) ×2 IMPLANT
SYR 50ML LL SCALE MARK (SYRINGE) ×2 IMPLANT
SYR CONTROL 10ML LL (SYRINGE) IMPLANT
SYSTEM SAHARA CHEST DRAIN ATS (WOUND CARE) ×2 IMPLANT
TOWEL GREEN STERILE (TOWEL DISPOSABLE) ×4 IMPLANT
TOWEL GREEN STERILE FF (TOWEL DISPOSABLE) ×6 IMPLANT
TRAY FOLEY MTR SLVR 16FR STAT (SET/KITS/TRAYS/PACK) ×4 IMPLANT
TROCAR XCEL BLADELESS 5X75MML (TROCAR) ×2 IMPLANT
TROCAR XCEL NON-BLD 5MMX100MML (ENDOMECHANICALS) IMPLANT
TROCAR Z-THREAD OPTICAL 5X100M (TROCAR) ×2 IMPLANT
TUBING EXTENTION W/L.L. (IV SETS) ×2 IMPLANT
TUBING LAP HI FLOW INSUFFLATIO (TUBING) ×2 IMPLANT
WARMER LAPAROSCOPE (MISCELLANEOUS) ×2 IMPLANT
WATER STERILE IRR 1000ML POUR (IV SOLUTION) ×4 IMPLANT
WIRE EMERALD 3MM-J .035X150CM (WIRE) IMPLANT

## 2024-01-15 NOTE — Anesthesia Procedure Notes (Signed)
 Procedure Name: Intubation Date/Time: 01/15/2024 12:58 PM  Performed by: Oley Aleck LABOR, CRNAPre-anesthesia Checklist: Patient identified, Emergency Drugs available, Suction available and Patient being monitored Patient Re-evaluated:Patient Re-evaluated prior to induction Oxygen Delivery Method: Circle system utilized Preoxygenation: Pre-oxygenation with 100% oxygen Induction Type: Inhalational induction with existing ETT Ventilation: Mask ventilation without difficulty Laryngoscope Size: Mac and 4 Grade View: Grade II Tube type: Oral Endobronchial tube: Double lumen EBT and 41 Fr Number of attempts: 1 Airway Equipment and Method: Stylet and Fiberoptic brochoscope Placement Confirmation: ETT inserted through vocal cords under direct vision, positive ETCO2, breath sounds checked- equal and bilateral and CO2 detector Secured at: 39 cm Tube secured with: Tape Dental Injury: Teeth and Oropharynx as per pre-operative assessment and Bloody posterior oropharynx  Comments: Intubated by C. Oley, CRNA; Dr. Maryclare at bedside

## 2024-01-15 NOTE — Anesthesia Procedure Notes (Signed)
 Arterial Line Insertion Start/End9/07/2023 8:08 AM, 01/15/2024 8:12 AM Performed by: Oley Aleck LABOR, CRNA, CRNA  Patient location: Pre-op. Preanesthetic checklist: patient identified, IV checked, site marked, risks and benefits discussed, surgical consent, monitors and equipment checked and pre-op evaluation Lidocaine  1% used for infiltration Right, radial was placed Catheter size: 20 G Hand hygiene performed , maximum sterile barriers used  and Seldinger technique used  Procedure performed without using ultrasound guided technique. Following insertion, dressing applied and Biopatch. Post procedure assessment: normal  Patient tolerated the procedure well with no immediate complications.

## 2024-01-15 NOTE — Anesthesia Procedure Notes (Signed)
 Procedure Name: Intubation Date/Time: 01/15/2024 8:48 AM  Performed by: Oley Aleck LABOR, CRNAPre-anesthesia Checklist: Patient identified, Emergency Drugs available, Suction available and Patient being monitored Patient Re-evaluated:Patient Re-evaluated prior to induction Oxygen Delivery Method: Circle system utilized Preoxygenation: Pre-oxygenation with 100% oxygen Induction Type: IV induction Ventilation: Mask ventilation without difficulty and Oral airway inserted - appropriate to patient size Laryngoscope Size: Mac and 4 Grade View: Grade III Tube type: Oral Tube size: 7.5 mm Number of attempts: 1 Airway Equipment and Method: Stylet Placement Confirmation: ETT inserted through vocal cords under direct vision, positive ETCO2 and breath sounds checked- equal and bilateral Secured at: 23 cm Tube secured with: Tape Dental Injury: Teeth and Oropharynx as per pre-operative assessment  Comments: intubated by C. Arasely Akkerman, CRNA; ebbs, clear throughout anterior fields except right upper lobe is coarse

## 2024-01-15 NOTE — Op Note (Signed)
      [address]       [name] [address]             [phone]        01/15/2024  Patient:  [name] Pre-Op Dx: Gastric cancer   Post-op Dx:  same Procedure: - Esophagoscopy - Robotic assisted thoracoscopy - Lysis of adhesion - Partial esophagectomy with esophagojejunostomy - Intercostal nerve block   Surgeon and Role:      * Claire Bridge, Linnie KIDD, MD - Primary  Assistant: MICAEL Cera, PA-C  An experienced assistant was required given the complexity of this surgery and the standard of surgical care. The assistant was needed for exposure, dissection, suctioning, retraction of delicate tissues and sutures, instrument exchange and for overall help during this procedure.   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  He has completed his neoadjuvant chemotherapy.  I will be available to assist with and esophagojejunostomy if needed.   Findings: There was not enough intra-abdominal esophagus for an abdominal.  We elected to proceed with a thoracic approach.  There were dense pleural adhesion in the chest requiring 45 minutes of lysis before we could expose the esophagus.    Operative Technique: After the risks, benefits and alternatives were thoroughly discussed, the patient was brought to the operative theatre.  Anesthesia was induced, and the patient was then prepped and draped in normal sterile fashion.  An appropriate surgical pause was performed, and pre-operative antibiotics were dosed accordingly.  The patient was then placed in a left lateral decubitus position.  There were dense adhesion that were encountered upon entry.  These were lysed so that we could place our ports.  The robotic ports were placed to triangulate the esophagus.  We continued to lyse adhesion.  We began our mobilization of the esophagus up to the carina.  The  esophagus was then mobilized circumferentially.  We then mobilized the esophagus along with the jejunal limb.  We ensured that the staple line in the conduit was oriented appropriately.  The esophagus was then divided.  A small enterotomy was then made and using the robotic stapler the back row the anastomosis between the jejunal conduit and the proximal esophagus was then created.  Then using 2-0 V-Loc sutures the front row of the anastomosis was closed in 2 layers.   The ports were removed, and a 110F chest tube was placed.  An intercostal nerve block was performed.  The lung was expanded.  The incisions were closed with absorbable suture.    The patient was then placed back in a supine position and the gastroscope was then inserted through the oropharynx and passed down.  The anastomosis appeared intact.  We then passed the gastroscope down under direct visualization through the anastomosis.  The patient tolerated the procedure without any immediate complications, and was transferred to the PACU in stable condition.  Shterna Laramee KIDD Rayas

## 2024-01-15 NOTE — Op Note (Signed)
 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) Postoperative Diagnosis: Same  Procedure:  Staging laparoscopy Open splenic biopsy Total gastrectomy with omentectomy and D2 lymphadenectomy Roux-en-Y jejunojejunostomy, with mobilization of a roux limb of jejunum for transthoracic esophagojejunostomy Feeding jejunostomy tube placement  Surgeon: Leonor Dawn, MD Assistant: Cordella Idler, MD  Co-Surgeon: Linnie Haven, MD  EBL: 150 mL  Anesthesia: General endotracheal  Specimens:  Spleen nodules Esophageal margin Stomach and omentum Left gastric lymph nodes New esophageal margin  Indications: [name] is a 68 yo male who was diagnosed with uT2N0 adenocarcinoma of the gastric cardia earlier this year. EUS showed evidence of invasion into the GE junction. Pre-treatment staging laparoscopy with peritoneal washings did not show evidence of metastatic disease. The patient completed 3 cycles neoadjuvant FLOT with some complications, and was not able to tolerate further chemotherapy. Restaging scans did not show any evidence of metastatic disease. After a discussion of the risks and benefits of surgery, he consented to proceed with a total gastrectomy.  Findings: Nodules and plaques on the surface of the spleen which were consistent with benign hyalinization on frozen section.  Tumor in the gastric cardia extending to the GE junction. Initial esophageal resection margin was highly suspicious for involvement with carcinoma and high-grade dysplasia on frozen section. An additional esophageal margin was resected and negative for malignancy and dysplasia on frozen section. A transabdominal esophagojejunostomy was not able to be performed without significant tension on the anastomosis, thus after consultation with Dr. Shyrl (thoracic surgery), the decision was made to perform  a transthoracic anastomosis. A roux limb of jejunum was mobilized and brought up to the esophageal hiatus, and a 14-Fr feeding J tube was placed prior to closure of the abdomen. A 19-Fr JP drain was left adjacent to the duodenal stump.  Procedure details: Informed consent was obtained in the preoperative area prior to the procedure. The patient was brought to the operating room and placed on the table in the supine position. General anesthesia was induced and appropriate lines and drains were placed for intraoperative monitoring. Perioperative antibiotics were administered per SCIP guidelines. The abdomen was prepped and draped in the usual sterile fashion. A pre-procedure timeout was taken verifying patient identity, surgical site and procedure to be performed.  A small skin incision was made in the upper midline, the fascia was grasped and elevated, and a Veress needle was inserted through the fascia.  Intraperitoneal placement was confirmed with a saline drop test, the abdomen was insufflated, and a 5 mm Visiport was placed.  The peritoneal cavity was inspected, including both hemidiaphragms, the surface of the liver, and the entire peritoneal surface.  There were no peritoneal nodules or evidence of metastatic disease within the abdomen.  The port was removed and the abdomen was desufflated.  An upper midline skin incision was made and the subcutaneous tissue was divided with cautery to expose the fascia.  The fascia was elevated and opened along the linea alba, and the peritoneum was opened with cautery.  There was a piece of mesh at the umbilicus from a previous hernia repair, and the upper portion of this mesh was divided with the fascia.  The falciform ligament was taken down off the abdominal with cautery, and divided with LigaSure.  An Mining engineer were placed.  The proximal stomach was palpated and a mass was palpable within the cardia, consistent with a  known  malignancy.  There were no palpable nodules on the surface of the liver.  On the spleen, there were numerous white nodules and plaques which appeared concerning for possible carcinomatosis.  A sample of these nodules were sharply excised from the surface of the spleen at the inferior pole and sent for frozen section.  Hemostasis was achieved at the biopsy site using argon.  The frozen analysis of the specimen was consistent with benign hyalinization and fibrosis, but there was no evidence of malignancy.  The gastrocolic omentum was separated from the transverse colon using cautery and LigaSure.  The lesser sac was opened.  The omentum was further separated from the transverse colon using cautery and LigaSure.  There were some thin adhesions between the pancreas and the posterior body of the stomach which were divided with cautery.  The omentum was then separated from the distal transverse colon and the hepatic flexure.  The short gastric vessels were divided with LigaSure, staying as close to the spleen as possible to harvest the associated lymphatic tissue.  The pyloric channel was examined and a mesenteric window was created in the proximal duodenum approximately 2 cm distal to the pylorus.  The right gastroepiploic vessels were circumferentially dissected out, ligated with 2-0 silk ties and divided with LigaSure.  The right gastric artery was identified and circumferentially dissected out near its origin.  The artery was ligated with a 3-0 silk suture ligature near its origin and divided.  The proximal duodenum was then transected approximately 2 cm distal to the pylorus using a TA 60 blue stapler.  This allowed the body of the stomach to be elevated off the pancreas.  The lesser omentum was opened using cautery.  There was an accessory versus a replaced left hepatic artery off the left gastric artery, which was carefully preserved.  The left gastric artery was dissected out just distal to the replaced left  hepatic artery and clamped.  On occlusion at this location, a strong palpable pulse remained within the replaced left hepatic artery.  The left gastric artery was ligated with a silk suture ligature and divided at this point.  The lesser omentum was further divided with LigaSure, taking great care to preserve the replaced left hepatic artery.  The peritoneum overlying the esophagus was then opened with cautery to mobilize the distal esophagus and separated from the esophageal hiatus.  The esophagus was circumferentially dissected out using primarily blunt dissection.  The distal esophagus within the mediastinum was also dissected out using gentle blunt finger dissection.  Cautery was used to divide thin filmy adhesions.  3-0 silk stay sutures were placed on either side of the distal esophagus to prevent retraction into the mediastinum after division of the esophagus.  The known mass was palpated within the proximal gastric cardia.  The NG tube was retracted back into the oropharynx by anesthesia.  A TA 60 blue stapler was then used to divide the distal esophagus just proximal to the GE junction.  The stomach and omentum were removed en bloc and examined.  The tumor was visible very close to the esophageal resection margin.  The proximal esophageal margin was sent for frozen section, and was highly suspicious for involvement with carcinoma.  An additional 1 cm margin of tissue was resected from the distal esophagus using a TA 60 blue stapler.  This new margin was sent for frozen section, and was negative for malignancy and high-grade dysplasia.  While awaiting the results of the frozen sections, additional lymphatic  tissue was dissected off the replaced right hepatic artery and proximal left gastric artery. This tissue was sent separately to pathology. There remained a pulse in the replaced left hepatic artery. A roux limb of jejunum was created for reconstruction.  Approximately 40 cm distal to the ligament of  Treitz, this segment of jejunum was easily able to reach the esophageal hiatus without any tension or resistance.  A mesenteric window was bluntly created at this point, and the jejunum was divided with the GIA 75 mm stapler with a blue load.  The mesentery was partially divided using LigaSure, taking care to preserve the vascular arcade at the base of the mesentery.  The distal transected end of the jejunum was easily able to reach the esophageal hiatus, with redundant jejunum to allow it to reach into the thoracic cavity if needed. About 50cm distal to this, a JJ anastomosis was created. The end of the biliary limb was laid adjacent to the jejunum in isoperistaltic fashion, and a 3-0 silk stay suture was placed. Enterotomies were created on the antimesenteric borders of each limb using cautery. A side-to-side (functional end-to-side) jejunojejunal anastomosis was created using a 75mm GIA stapler with a blue load.  The common enterotomy was closed with a TA 60 blue stapler.  A 3-0 silk suture was placed at the apex of the anastomosis to minimize tension.  The mesenteric defect was partially closed with 3-0 silk figure-of-eight sutures, however these kept pulling through the peritoneum overlying the mesentery.  I was concerned about inadvertently ligating the vascular supply by taking deeper bites with the sutures, and the mesenteric defect was very large.  I felt that obstruction or strangulation of the bowel in the event of an internal hernia was unlikely, thus this space was left open.   At this point Dr. Shyrl assisted with evaluating the distal esophagus. The remaining esophagus was barely able to extend below the esophageal hiatus, and there was significant tension on it. Some further blunt mobilization was performed, however it was clear that there would be significant tension on the anastomosis if a transabdominal approach was performed. The distal esophagus also appeared slightly dusky at the staple  line. Thus the decision was made to perform a transthoracic anastomosis. The mobilized roux limb of jejunum was secured to the distal esophagus using Ethibond sutures, to allow the jejunum to be pulled up into the chest later. The abdomen was irrigated and appeared hemostatic.  The wound protector and retractors were removed.  A location for the J-tube was selected on the jejunum approximately 15 cm distal to the GJ anastomosis. A 3-0 silk pursestring suture was placed on the antimesenteric border of the bowel at this point, and an enterotomy was created with cautery at the center of the pursestring.  A 14-Fr jejunostomy tube was brought onto the field.  The feeding tube was placed in the left upper quadrant abdominal wall.  A small skin incision was made and the end of the feeding tube was passed through the abdominal wall.  The tube was cut on a bevel to appropriate length, and the end of the tube was fed into the jejunum through the enterotomy.  The end of the tube was milked distally and the pursestring suture was tied down to secure the tube in place.  A short Witzel tunnel was created using 3-0 silk sutures.  1 mL of sterile water  was instilled into the balloon.  The lumen of the bowel was patent around the feeding tube at  this point.  The jejunum was pexied to the abdominal wall around the feeding tube using 2-0 silk sutures.  The bumper of the feeding tube was then cinched down to the abdominal wall.  A falciform ligament flap was mobilized and placed over the duodenal stump.  A 19-Fr JP drain was laid adjacent to the duodenal stump and brought out through the right upper quadrant abdominal wall.  The drain was secured to the skin with 2-0 nylon suture.  The fascia was closed using a running 1 Novafil suture.  Scarpa's layer was closed with running 3-0 Vicryl suture, and the skin was closed with a running subcuticular 4 Monocryl suture.  Dermabond was applied. The J tube bumper was secured to the skin  using 2-0 Nylon sutures.  All counts were correct x2 at the end of the procedure.  At this point the procedure was turned over to Dr. Shyrl for completion of the esophagojejunal anastomosis.  Leonor Dawn, MD 01/15/24 1:58 PM
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
## 2024-01-15 NOTE — Transfer of Care (Signed)
 Immediate Anesthesia Transfer of Care Note  Patient: [name]  Procedure(s) Performed: GASTRECTOMY, TOTAL CREATION, JEJUNOSTOMY LAPAROSCOPY, DIAGNOSTIC PARTIAL ESOPHAGECTOMY, ROBOT-ASSISTED (Right: Chest) BLOCK, NERVE, INTERCOSTAL (Right: Chest)  Patient Location: PACU  Anesthesia Type:General  Level of Consciousness: awake, drowsy, patient cooperative, and responds to stimulation  Airway & Oxygen Therapy: Patient Spontanous Breathing and Patient connected to face mask oxygen  Post-op Assessment: Report given to RN and Post -op Vital signs reviewed and stable  Post vital signs: Reviewed and stable  Last Vitals:  Vitals Value Taken Time  BP 112/73 01/15/24 17:08  Temp 36.4 C 01/15/24 17:08  Pulse 67 01/15/24 17:08  Resp 11 01/15/24 17:08  SpO2 99 % 01/15/24 17:08  Vitals shown include unfiled device data.  Pt resting comfortably      Complications: No notable events documented.

## 2024-01-15 NOTE — Progress Notes (Signed)
 [facility] Tube Team Note:  Consult received to provide MD with a 16 French bridle for an NG tube in the OR. Spoke with OR staff via phone call who confirmed need for 16 French bridle in OR 10. RD brought bridle down to OR. Bridle then handed off to OR staff who provided it to Dr. Dasie. Dr. Dasie to bridle tube in OR. Will complete [facility] Team consult.   Mallie Satchel, MS, RD, LDN Registered Dietitian II Please see [facility] for contact information.
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
## 2024-01-16 ENCOUNTER — Encounter (HOSPITAL_COMMUNITY): Payer: Self-pay | Admitting: Surgery

## 2024-01-16 ENCOUNTER — Encounter (HOSPITAL_COMMUNITY)
Admission: RE | Disposition: A | Discharge status: Skilled Nursing Facility | Payer: Self-pay | Source: Home / Self Care | Attending: Surgery

## 2024-01-16 ENCOUNTER — Inpatient Hospital Stay (HOSPITAL_COMMUNITY): Admitting: Anesthesiology

## 2024-01-16 ENCOUNTER — Inpatient Hospital Stay (HOSPITAL_COMMUNITY)

## 2024-01-16 DIAGNOSIS — K832 Perforation of bile duct: Secondary | ICD-10-CM | POA: Diagnosis not present

## 2024-01-16 DIAGNOSIS — I4891 Unspecified atrial fibrillation: Secondary | ICD-10-CM

## 2024-01-16 DIAGNOSIS — J449 Chronic obstructive pulmonary disease, unspecified: Secondary | ICD-10-CM

## 2024-01-16 DIAGNOSIS — E119 Type 2 diabetes mellitus without complications: Secondary | ICD-10-CM

## 2024-01-16 DIAGNOSIS — K9189 Other postprocedural complications and disorders of digestive system: Secondary | ICD-10-CM | POA: Diagnosis not present

## 2024-01-16 DIAGNOSIS — E43 Unspecified severe protein-calorie malnutrition: Secondary | ICD-10-CM

## 2024-01-16 DIAGNOSIS — Z9889 Other specified postprocedural states: Secondary | ICD-10-CM

## 2024-01-16 HISTORY — DX: Unspecified severe protein-calorie malnutrition: E43

## 2024-01-16 HISTORY — PX: THORACOSCOPY, ROBOT-ASSISTED: SHX7645

## 2024-01-16 LAB — MAGNESIUM: Magnesium: 1.4 mg/dL — ABNORMAL LOW (ref 1.7–2.4)

## 2024-01-16 LAB — GLUCOSE, CAPILLARY
Glucose-Capillary: 113 mg/dL — ABNORMAL HIGH (ref 70–99)
Glucose-Capillary: 123 mg/dL — ABNORMAL HIGH (ref 70–99)
Glucose-Capillary: 91 mg/dL (ref 70–99)
Glucose-Capillary: 104 mg/dL — ABNORMAL HIGH (ref 70–99)
Glucose-Capillary: 129 mg/dL — ABNORMAL HIGH (ref 70–99)
Glucose-Capillary: 131 mg/dL — ABNORMAL HIGH (ref 70–99)

## 2024-01-16 LAB — BASIC METABOLIC PANEL WITH GFR
Anion gap: 14 (ref 5–15)
BUN: 13 mg/dL (ref 8–23)
CO2: 23 mmol/L (ref 22–32)
Calcium: 9.1 mg/dL (ref 8.9–10.3)
Chloride: 101 mmol/L (ref 98–111)
Creatinine, Ser: 0.89 mg/dL (ref 0.61–1.24)
GFR, Estimated: >60 mL/min (ref >60)
Glucose, Bld: 108 mg/dL — ABNORMAL HIGH (ref 70–99)
Potassium: 4.3 mmol/L (ref 3.5–5.1)
Sodium: 138 mmol/L (ref 135–145)

## 2024-01-16 LAB — CBC
HCT: 35.1 % — ABNORMAL LOW (ref 39.0–52.0)
Hemoglobin: 12.1 g/dL — ABNORMAL LOW (ref 13.0–17.0)
MCH: 33.2 pg (ref 26.0–34.0)
MCHC: 34.5 g/dL (ref 30.0–36.0)
MCV: 96.4 fL (ref 80.0–100.0)
Platelets: 190 K/uL (ref 150–400)
RBC: 3.64 MIL/uL — ABNORMAL LOW (ref 4.22–5.81)
RDW: 12.2 % (ref 11.5–15.5)
WBC: 18.6 K/uL — ABNORMAL HIGH (ref 4.0–10.5)
nRBC: 0 % (ref 0.0–0.2)

## 2024-01-16 SURGERY — THORACOSCOPY, ROBOT-ASSISTED
Anesthesia: General | Site: Chest

## 2024-01-16 MED ORDER — CEFAZOLIN SODIUM-DEXTROSE 2-4 GM/100ML-% IV SOLN
INTRAVENOUS | Status: AC
  Filled 2024-01-16: qty 100

## 2024-01-16 MED ORDER — FENTANYL CITRATE (PF) 100 MCG/2ML IJ SOLN: 50.0000 ug | Freq: Once | INTRAMUSCULAR | Status: AC

## 2024-01-16 MED ORDER — CHLORHEXIDINE GLUCONATE 0.12 % MT SOLN
OROMUCOSAL | Status: AC
  Filled 2024-01-16: qty 15

## 2024-01-16 MED ORDER — FENTANYL CITRATE (PF) 250 MCG/5ML IJ SOLN
INTRAMUSCULAR | Status: DC | PRN
  Administered 2024-01-16: 50 ug via INTRAVENOUS
  Administered 2024-01-16 (×2): 100 ug via INTRAVENOUS
  Administered 2024-01-16 (×2): 50 ug via INTRAVENOUS

## 2024-01-16 MED ORDER — 0.9 % SODIUM CHLORIDE (POUR BTL) OPTIME
TOPICAL | Status: DC | PRN
  Administered 2024-01-16: 2000 mL

## 2024-01-16 MED ORDER — LACTATED RINGERS IV SOLN: INTRAVENOUS | Status: DC

## 2024-01-16 MED ORDER — ACETAMINOPHEN 10 MG/ML IV SOLN: 1000.0000 mg | Freq: Once | INTRAVENOUS | Status: DC | PRN

## 2024-01-16 MED ORDER — ACETAMINOPHEN 10 MG/ML IV SOLN
INTRAVENOUS | Status: AC
  Filled 2024-01-16: qty 100

## 2024-01-16 MED ORDER — FENTANYL CITRATE (PF) 250 MCG/5ML IJ SOLN
INTRAMUSCULAR | Status: AC
  Filled 2024-01-16: qty 5

## 2024-01-16 MED ORDER — LIDOCAINE HCL (CARDIAC) PF 100 MG/5ML IV SOSY
PREFILLED_SYRINGE | INTRAVENOUS | Status: DC | PRN
  Administered 2024-01-16: 80 mg via INTRATRACHEAL

## 2024-01-16 MED ORDER — ROCURONIUM BROMIDE 100 MG/10ML IV SOLN
INTRAVENOUS | Status: DC | PRN
  Administered 2024-01-16: 20 mg via INTRAVENOUS
  Administered 2024-01-16: 30 mg via INTRAVENOUS

## 2024-01-16 MED ORDER — EPHEDRINE SULFATE-NACL 50-0.9 MG/10ML-% IV SOSY
PREFILLED_SYRINGE | INTRAVENOUS | Status: DC | PRN
  Administered 2024-01-16: 5 mg via INTRAVENOUS

## 2024-01-16 MED ORDER — CHLORHEXIDINE GLUCONATE CLOTH 2 % EX PADS
6.0000 | MEDICATED_PAD | Freq: Every day | CUTANEOUS | Status: DC
  Administered 2024-01-16 – 2024-02-06 (×24): 6 via TOPICAL

## 2024-01-16 MED ORDER — LIDOCAINE 5 % EX PTCH
1.0000 | MEDICATED_PATCH | CUTANEOUS | Status: DC
  Administered 2024-01-16 – 2024-01-26 (×10): 1 via TRANSDERMAL
  Filled 2024-01-16 (×11): qty 1

## 2024-01-16 MED ORDER — ALBUMIN HUMAN 5 % IV SOLN: INTRAVENOUS | Status: DC | PRN

## 2024-01-16 MED ORDER — PHENYLEPHRINE HCL (PRESSORS) 10 MG/ML IV SOLN
INTRAVENOUS | Status: DC | PRN
  Administered 2024-01-16 (×3): 80 ug via INTRAVENOUS

## 2024-01-16 MED ORDER — OXYCODONE HCL 5 MG/5ML PO SOLN: 5.0000 mg | Freq: Once | ORAL | Status: DC | PRN

## 2024-01-16 MED ORDER — DROPERIDOL 2.5 MG/ML IJ SOLN: 0.6250 mg | Freq: Once | INTRAMUSCULAR | Status: DC | PRN

## 2024-01-16 MED ORDER — OXYCODONE HCL 5 MG PO TABS: 5.0000 mg | ORAL_TABLET | Freq: Once | ORAL | Status: DC | PRN

## 2024-01-16 MED ORDER — ONDANSETRON HCL 4 MG/2ML IJ SOLN
INTRAMUSCULAR | Status: DC | PRN
  Administered 2024-01-16: 4 mg via INTRAVENOUS

## 2024-01-16 MED ORDER — ORAL CARE MOUTH RINSE: 15.0000 mL | Freq: Once | OROMUCOSAL | Status: DC

## 2024-01-16 MED ORDER — SUCCINYLCHOLINE CHLORIDE 200 MG/10ML IV SOSY
PREFILLED_SYRINGE | INTRAVENOUS | Status: DC | PRN
  Administered 2024-01-16: 140 mg via INTRAVENOUS

## 2024-01-16 MED ORDER — LACTATED RINGERS IV SOLN: INTRAVENOUS | Status: DC | PRN

## 2024-01-16 MED ORDER — FENTANYL CITRATE (PF) 100 MCG/2ML IJ SOLN
INTRAMUSCULAR | Status: AC
  Filled 2024-01-16: qty 2

## 2024-01-16 MED ORDER — FREE WATER
30.0000 mL | Freq: Three times a day (TID) | Status: DC
  Administered 2024-01-16 – 2024-01-21 (×16): 30 mL

## 2024-01-16 MED ORDER — PHENYLEPHRINE HCL-NACL 20-0.9 MG/250ML-% IV SOLN
INTRAVENOUS | Status: DC | PRN
  Administered 2024-01-16: 20 ug/min via INTRAVENOUS

## 2024-01-16 MED ORDER — CHLORHEXIDINE GLUCONATE 0.12 % MT SOLN: 15.0000 mL | Freq: Once | OROMUCOSAL | Status: DC

## 2024-01-16 MED ORDER — FENTANYL CITRATE (PF) 100 MCG/2ML IJ SOLN
INTRAMUSCULAR | Status: AC
Start: 2024-01-16 — End: 2024-01-16
  Administered 2024-01-16: 50 ug via INTRAVENOUS
  Filled 2024-01-16: qty 2

## 2024-01-16 MED ORDER — METHOCARBAMOL 1000 MG/10ML IJ SOLN
1000.0000 mg | Freq: Three times a day (TID) | INTRAMUSCULAR | Status: DC
  Administered 2024-01-16 – 2024-01-18 (×5): 1000 mg via INTRAVENOUS
  Filled 2024-01-16 (×5): qty 10

## 2024-01-16 MED ORDER — PROPOFOL 10 MG/ML IV BOLUS
INTRAVENOUS | Status: AC
  Filled 2024-01-16: qty 20

## 2024-01-16 MED ORDER — PROPOFOL 10 MG/ML IV BOLUS
INTRAVENOUS | Status: DC | PRN
Start: 2024-01-16 — End: 2024-01-16
  Administered 2024-01-16: 110 mg via INTRAVENOUS
  Administered 2024-01-16: 50 mg via INTRAVENOUS

## 2024-01-16 MED ORDER — SUGAMMADEX SODIUM 200 MG/2ML IV SOLN
INTRAVENOUS | Status: DC | PRN
Start: 2024-01-16 — End: 2024-01-16
  Administered 2024-01-16: 200 mg via INTRAVENOUS

## 2024-01-16 MED ORDER — CEFAZOLIN SODIUM-DEXTROSE 2-3 GM-%(50ML) IV SOLR
INTRAVENOUS | Status: DC | PRN
  Administered 2024-01-16: 2 g via INTRAVENOUS

## 2024-01-16 MED ORDER — MORPHINE SULFATE 1 MG/ML IV SOLN PCA
INTRAVENOUS | Status: DC
  Administered 2024-01-17: 7.5 mg via INTRAVENOUS
  Administered 2024-01-17: 12.29 mg via INTRAVENOUS
  Administered 2024-01-17: 16.05 mg via INTRAVENOUS
  Administered 2024-01-19: 4.5 mg via INTRAVENOUS
  Administered 2024-01-20: 1.5 mg via INTRAVENOUS
  Administered 2024-01-20: 9 mg via INTRAVENOUS
  Administered 2024-01-20: 3 mg via INTRAVENOUS
  Administered 2024-01-20: 4.5 mg via INTRAVENOUS
  Filled 2024-01-16 (×9): qty 30

## 2024-01-16 MED ORDER — DEXAMETHASONE SODIUM PHOSPHATE 10 MG/ML IJ SOLN
INTRAMUSCULAR | Status: DC | PRN
  Administered 2024-01-16: 10 mg via INTRAVENOUS

## 2024-01-16 MED ORDER — FENTANYL CITRATE (PF) 100 MCG/2ML IJ SOLN
25.0000 ug | INTRAMUSCULAR | Status: DC | PRN
  Administered 2024-01-16 (×2): 50 ug via INTRAVENOUS

## 2024-01-16 MED ORDER — HYDRALAZINE HCL 20 MG/ML IJ SOLN
10.0000 mg | INTRAMUSCULAR | Status: DC | PRN
  Administered 2024-01-21 – 2024-01-22 (×2): 10 mg via INTRAVENOUS
  Filled 2024-01-16 (×2): qty 1

## 2024-01-16 SURGICAL SUPPLY — 97 items
BLADE CLIPPER SURG (BLADE) ×1 IMPLANT
BLADE SURG 11 STRL SS (BLADE) ×1 IMPLANT
CANISTER SUCTION 3000ML PPV (SUCTIONS) ×4 IMPLANT
CANNULA REDUCER 12-8 DVNC XI (CANNULA) ×3 IMPLANT
CATH THORACIC 28FR (CATHETERS) IMPLANT
CAUTERY SPATULA MNPLR 1.7 DVNC (INSTRUMENTS) IMPLANT
CHLORAPREP W/TINT 26 (MISCELLANEOUS) ×3 IMPLANT
CNTNR URN SCR LID CUP LEK RST (MISCELLANEOUS) ×2 IMPLANT
CONN ST 1/4X3/8 BEN (MISCELLANEOUS) ×1 IMPLANT
COVER TIP SHEARS 8 DVNC (MISCELLANEOUS) IMPLANT
DEFOGGER SCOPE WARM SEASHARP (MISCELLANEOUS) ×2 IMPLANT
DERMABOND ADVANCED .7 DNX12 (GAUZE/BANDAGES/DRESSINGS) ×3 IMPLANT
DEVICE SUTURE ENDOST 10MM (ENDOMECHANICALS) IMPLANT
DRAIN CHANNEL 19F RND (DRAIN) IMPLANT
DRAIN CHANNEL 28F RND 3/8 FF (WOUND CARE) IMPLANT
DRAIN PENROSE .5X12 LATEX STL (DRAIN) IMPLANT
DRAPE ARM DVNC X/XI (DISPOSABLE) ×8 IMPLANT
DRAPE COLUMN DVNC XI (DISPOSABLE) ×2 IMPLANT
DRAPE CV SPLIT W-CLR ANES SCRN (DRAPES) ×3 IMPLANT
DRAPE INCISE IOBAN 66X45 STRL (DRAPES) ×2 IMPLANT
DRAPE SURG ORHT 6 SPLT 77X108 (DRAPES) ×3 IMPLANT
DRIVER NDL MEGA 8 DVNC XI (INSTRUMENTS) IMPLANT
DRIVER NDLE MEGA DVNC XI (INSTRUMENTS) IMPLANT
ELECTRODE REM PT RTRN 9FT ADLT (ELECTROSURGICAL) ×2 IMPLANT
EVACUATOR SILICONE 100CC (DRAIN) IMPLANT
FELT TEFLON 1X6 (MISCELLANEOUS) IMPLANT
FORCEPS BPLR FENES DVNC XI (FORCEP) IMPLANT
FORCEPS BPLR LNG DVNC XI (INSTRUMENTS) IMPLANT
FORCEPS CADIERE DVNC XI (FORCEP) IMPLANT
GAUZE KITTNER 4X5 RF (MISCELLANEOUS) ×3 IMPLANT
GAUZE SPONGE 4X4 12PLY STRL (GAUZE/BANDAGES/DRESSINGS) IMPLANT
GAUZE SPONGE 4X4 12PLY STRL LF (GAUZE/BANDAGES/DRESSINGS) IMPLANT
GLOVE BIO SURGEON STRL SZ7.5 (GLOVE) ×6 IMPLANT
GOWN STRL REUS W/ TWL LRG LVL3 (GOWN DISPOSABLE) ×4 IMPLANT
GOWN STRL REUS W/ TWL XL LVL3 (GOWN DISPOSABLE) ×4 IMPLANT
GOWN STRL REUS W/TWL 2XL LVL3 (GOWN DISPOSABLE) ×1 IMPLANT
GOWN STRL SURGICAL XL XLNG (GOWN DISPOSABLE) ×4 IMPLANT
GRAFT MYRIAD 5 LAYER 5X5 (Graft) IMPLANT
GRASPER ENDOPATH ANVIL 10MM (MISCELLANEOUS) IMPLANT
GRASPER SUT TROCAR 14GX15 (MISCELLANEOUS) ×1 IMPLANT
GRASPER TIP-UP FEN DVNC XI (INSTRUMENTS) IMPLANT
HEMOSTAT SURGICEL 2X14 (HEMOSTASIS) ×3 IMPLANT
IRRIGATOR SUCT 8 DISP DVNC XI (IRRIGATION / IRRIGATOR) IMPLANT
IV NS 1000ML BAXH (IV SOLUTION) IMPLANT
J-TUBE MIC 16FX51 UNV ENFIT (TUBING) IMPLANT
J-TUBE MIC 18FX51 UNV ENFIT (TUBING) IMPLANT
KIT BASIN OR (CUSTOM PROCEDURE TRAY) ×2 IMPLANT
KIT DILATOR VASC 18G NDL (KITS) IMPLANT
KIT TURNOVER KIT B (KITS) ×2 IMPLANT
NDL 22X1.5 STRL (OR ONLY) (MISCELLANEOUS) ×1 IMPLANT
NDL DRIVE SUT CUT DVNC (INSTRUMENTS) IMPLANT
NEEDLE 22X1.5 STRL (OR ONLY) (MISCELLANEOUS) ×1 IMPLANT
NEEDLE DRIVE SUT CUT DVNC (INSTRUMENTS) IMPLANT
NS IRRIG 1000ML POUR BTL (IV SOLUTION) ×5 IMPLANT
PACK CHEST (CUSTOM PROCEDURE TRAY) ×2 IMPLANT
PAD ARMBOARD POSITIONER FOAM (MISCELLANEOUS) ×7 IMPLANT
PORT ACCESS TROCAR AIRSEAL 12 (TROCAR) ×1 IMPLANT
RETRACTOR WOUND ALXS 19CM XSML (INSTRUMENTS) ×1 IMPLANT
SCISSORS LAP 5X35 DISP (ENDOMECHANICALS) IMPLANT
SEAL UNIV 5-12 XI (MISCELLANEOUS) ×9 IMPLANT
SEALER SYNCHRO 8 IS4000 DVNC (MISCELLANEOUS) ×1 IMPLANT
SET GASTRO INIT PLACEMENT MED (SET/KITS/TRAYS/PACK) ×1 IMPLANT
SET TRI-LUMEN FLTR TB AIRSEAL (TUBING) ×2 IMPLANT
SOLUTION ELECTROSURG ANTI STCK (MISCELLANEOUS) IMPLANT
STAPLER CIRC 25MM 4.8MM THK (STAPLE) IMPLANT
STAPLER TRANS-ORAL 25MM EEA (STAPLE) IMPLANT
STOPCOCK 4 WAY LG BORE MALE ST (IV SETS) ×2 IMPLANT
SUT ETHIBOND 0 36 GRN (SUTURE) IMPLANT
SUT SILK 1 MH (SUTURE) ×2 IMPLANT
SUT SILK 2 0 SH (SUTURE) ×1 IMPLANT
SUT SILK 2 0SH CR/8 30 (SUTURE) IMPLANT
SUT SURGIDAC NAB ES-9 0 48 120 (SUTURE) ×2 IMPLANT
SUT VIC AB 1 CTX36XBRD ANBCTR (SUTURE) IMPLANT
SUT VIC AB 2-0 CT1 36 (SUTURE) IMPLANT
SUT VIC AB 2-0 CT1 TAPERPNT 27 (SUTURE) ×1 IMPLANT
SUT VIC AB 2-0 SH 27XBRD (SUTURE) ×1 IMPLANT
SUT VIC AB 3-0 SH 27X BRD (SUTURE) ×6 IMPLANT
SUT VIC AB 3-0 SH 8-18 (SUTURE) ×1 IMPLANT
SUT VICRYL 0 TIES 12 18 (SUTURE) ×1 IMPLANT
SUT VICRYL 0 UR6 27IN ABS (SUTURE) ×6 IMPLANT
SUT VLOC 180 0 9IN GS21 (SUTURE) ×1 IMPLANT
SUTURE V-LC BRB 180 2/0GR6GS22 (SUTURE) IMPLANT
SYR 10ML LL (SYRINGE) ×2 IMPLANT
SYR 20ML LL LF (SYRINGE) ×2 IMPLANT
SYR 50ML LL SCALE MARK (SYRINGE) ×2 IMPLANT
SYSTEM SAHARA CHEST DRAIN ATS (WOUND CARE) ×2 IMPLANT
TAPE CLOTH 4X10 WHT NS (GAUZE/BANDAGES/DRESSINGS) ×1 IMPLANT
TAPE CLOTH SURG 4X10 WHT LF (GAUZE/BANDAGES/DRESSINGS) IMPLANT
TOWEL GREEN STERILE (TOWEL DISPOSABLE) ×3 IMPLANT
TOWEL GREEN STERILE FF (TOWEL DISPOSABLE) ×2 IMPLANT
TRAY FOLEY MTR SLVR 16FR STAT (SET/KITS/TRAYS/PACK) ×2 IMPLANT
TROCAR XCEL BLADELESS 5X75MML (TROCAR) ×1 IMPLANT
TROCAR XCEL NON-BLD 5MMX100MML (ENDOMECHANICALS) IMPLANT
TUBING EXTENTION W/L.L. (IV SETS) ×2 IMPLANT
TUBING LAP HI FLOW INSUFFLATIO (TUBING) ×1 IMPLANT
WATER STERILE IRR 1000ML POUR (IV SOLUTION) ×3 IMPLANT
WIRE EMERALD 3MM-J .035X150CM (WIRE) IMPLANT

## 2024-01-16 NOTE — Progress Notes (Addendum)
 1 Day Post-Op Procedure(s) (LRB): GASTRECTOMY, TOTAL (N/A) CREATION, JEJUNOSTOMY (N/A) LAPAROSCOPY, DIAGNOSTIC (N/A) PARTIAL ESOPHAGECTOMY, ROBOT-ASSISTED (Right) BLOCK, NERVE, INTERCOSTAL (Right) Subjective: C/o pain  Objective: Vital signs in last 24 hours: Temp:  [97.5 F (36.4 C)-98.6 F (37 C)] 98.2 F (36.8 C) (09/04 0400) Pulse Rate:  [62-78] 72 (09/04 0600) Cardiac Rhythm: Normal sinus rhythm;Heart block;Bundle branch block (09/04 0700) Resp:  [10-22] 15 (09/04 0600) BP: (117-165)/(69-93) 144/81 (09/04 0600) SpO2:  [96 %-100 %] 96 % (09/04 0600) Arterial Line BP: (120-140)/(57) 140/57 (09/03 1745) FiO2 (%):  [28 %] 28 % (09/04 0559)  Hemodynamic parameters for last 24 hours:    Intake/Output from previous day: 09/03 0701 - 09/04 0700 In: 5030.7 [I.V.:3959.2; IV Piggyback:1010] Out: 3145 [Urine:2140; Drains:285; Blood:300; Chest Tube:420] Intake/Output this shift: No intake/output data recorded.  General appearance: alert, cooperative, and no distress Heart: regular rate and rhythm Lungs: clear to auscultation bilaterally Abdomen: some incisional tenderness, + BS Extremities: no calf tenderness Wound: incisions ok  Lab Results: Recent Labs    01/15/24 1714 01/16/24 0452  WBC 15.1* 18.6*  HGB 11.1* 12.1*  HCT 32.9* 35.1*  PLT 179 190   BMET:  Recent Labs    01/15/24 1714 01/16/24 0452  NA 136 138  K 3.7 4.3  CL 105 101  CO2 23 23  GLUCOSE 122* 108*  BUN 13 13  CREATININE 0.97 0.89  CALCIUM  8.5* 9.1    PT/INR: No results for input(s): LABPROT, INR in the last 72 hours. ABG    Component Value Date/Time   PHART 7.307 (L) 01/15/2024 1459   HCO3 25.4 01/15/2024 1459   TCO2 25 01/15/2024 1505   ACIDBASEDEF 2.0 01/15/2024 1459   O2SAT 94 01/15/2024 1459   CBG (last 3)  Recent Labs    01/15/24 2026 01/15/24 2307 01/16/24 0320  GLUCAP 122* 123* 113*    Meds Scheduled Meds:  enoxaparin  (LOVENOX ) injection  40 mg Subcutaneous Q24H    insulin  aspart  0-15 Units Subcutaneous Q4H   methocarbamol  (ROBAXIN ) injection  1,000 mg Intravenous Q8H   metoprolol  tartrate  5 mg Intravenous Q8H   morphine    Intravenous Q4H   Continuous Infusions:  acetaminophen  Stopped (01/16/24 0529)   lactated ringers  100 mL/hr at 01/16/24 0625   PRN Meds:.albuterol , diphenhydrAMINE , hydrALAZINE , naloxone  **AND** sodium chloride  flush, ondansetron  (ZOFRAN ) IV  Xrays No results found.  Assessment/Plan: S/P Procedure(s) (LRB): GASTRECTOMY, TOTAL (N/A) CREATION, JEJUNOSTOMY (N/A) LAPAROSCOPY, DIAGNOSTIC (N/A) PARTIAL ESOPHAGECTOMY, ROBOT-ASSISTED (Right) BLOCK, NERVE, INTERCOSTAL (Right) POD#1  1 afeb, S BP 110's-160's, SR w/BBB 2 O2 sats good on 2 liters [place] 3 good UOP, normal renal fxn 4 CT 420 ml- some serosang appearance but more proximal is darker- keeping CT in place till swallow study 5 Bulb drain appears serosang, 40 ml- keep in place till swallow study 6 CXR- minor atx- will order Incentive spirom 7 MG++ 1.4- defer medical management to gen surg 8 leukocytosis, monitor, likely reactive 9 expected ABLA- minor , cont to monitor 10 on PCA for main ,reports not working well,  may need different narcotic- defer to primary     LOS: 1 day    Lemond FORBES Cera PA-C Pager [phone] 01/16/2024  Transition of pleural fluid.  Now appears bilious Will take back to OR for EGD and R RATS  Lanaiya Lantry O Lora Chavers

## 2024-01-16 NOTE — Op Note (Signed)
      [address]       [name] [address]             [id number]        01/16/2024  Patient:  [name] Pre-Op Dx: Anastomotic leak.   Status post esophagojejunostomy Post-op Dx: Same Procedure: - Esophagoscopy - Robotic assisted thoracoscopy - I reinforcement esophageal anastomosis with 5 layer myriad mesh   Surgeon and Role:      * Audiel Scheiber, Linnie KIDD, MD - Primary  Assistant: MICAEL Cera, PA-C  An experienced assistant was required given the complexity of this surgery and the standard of surgical care. The assistant was needed for exposure, dissection, suctioning, retraction of delicate tissues and sutures, instrument exchange and for overall help during this procedure.   Anesthesia  general EBL: 10 ml Blood Administration: None Specimen: None   Counts: correct   Indications: 68 year old male underwent a total gastrectomy with esophagojejunostomy.  He had a change in his chest tube output which appeared more bilious.  He was brought to the operating theater for further interrogation. Findings: The air leak test that was performed did not show an obvious leak.  There clearly was a leak since there was bilious staining within the pleural cavity.  Another air leak test was performed after the revision, and there was no obvious leak.  Operative Technique: After the risks, benefits and alternatives were thoroughly discussed, the patient was brought to the operative theatre.  Anesthesia was induced, and the esophagoscope was passed through the oropharynx down to the stomach.  The scope was retroflexed.  On retrograde examination of the esophagus, The scope was then parked at 25 cm from the incisors.  The patient was then prepped and draped in normal sterile fashion.  An appropriate surgical pause was performed, and pre-operative antibiotics were dosed accordingly.   The patient was then placed in a left lateral decubitus position.  The robotic ports were  placed to triangulate the esophagus.  We focused on the anastomosis.  This area was then surrounded with normal saline and an air leak test was performed using the upper endoscopy.  There is no obvious leak.  I placed myriad mesh pledgets on the esophageal and jejunal side an area of the anastomosis that was stained with bile.  I then used interrupted 3-0 Vicryl suture to reinforce this.  I then took another V-Loc suture and oversewed the anastomosis from layer with the endoscope placed to ensure that we did not stricture of the anastomosis.  Another air leak test was performed with no evidence of leak.   The ports were removed, and a 42F chest tube was placed.  An intercostal nerve block was performed.  The lung was expanded.  The incisions were closed with absorbable suture.    The patient was then placed back in a supine position and the gastroscope was then inserted through the oropharynx and passed down.  The anastomosis appeared intact.  We then passed the gastroscope down under direct visualization past the anastomosis.  The patient tolerated the procedure without any immediate complications, and was transferred to the PACU in stable condition.  Cortlyn Cannell KIDD Rayas

## 2024-01-16 NOTE — Anesthesia Preprocedure Evaluation (Signed)
 Anesthesia Evaluation  Patient identified by MRN, date of birth, ID band Patient awake    Reviewed: Allergy & Precautions, H&P , NPO status , Patient's Chart, lab work & pertinent test results  History of Anesthesia Complications Negative for: history of anesthetic complications  Airway Mallampati: II   Neck ROM: full    Dental  (+) Poor Dentition, Dental Advisory Given   Pulmonary asthma , COPD, Current Smoker and Patient abstained from smoking.   breath sounds clear to auscultation       Cardiovascular hypertension, + dysrhythmias Atrial Fibrillation  Rhythm:regular Rate:Normal     Neuro/Psych  Headaches PSYCHIATRIC DISORDERS  Depression     Neuromuscular disease    GI/Hepatic ,GERD  ,,(+) Hepatitis -, CGastric CA   Endo/Other  diabetes, Type 2    Renal/GU      Musculoskeletal  (+) Arthritis ,    Abdominal   Peds  Hematology   Anesthesia Other Findings POD 1 with bile leak  Reproductive/Obstetrics                              Anesthesia Physical Anesthesia Plan  ASA: 3  Anesthesia Plan: General   Post-op Pain Management:    Induction: Intravenous  PONV Risk Score and Plan: 1 and Ondansetron , Dexamethasone , Midazolam  and Treatment may vary due to age or medical condition  Airway Management Planned: Oral ETT and Double Lumen EBT  Additional Equipment: ClearSight  Intra-op Plan:   Post-operative Plan: Extubation in OR  Informed Consent: I have reviewed the patients History and Physical, chart, labs and discussed the procedure including the risks, benefits and alternatives for the proposed anesthesia with the patient or authorized representative who has indicated his/her understanding and acceptance.     Dental advisory given  Plan Discussed with: CRNA, Anesthesiologist and Surgeon  Anesthesia Plan Comments: (PAT note by Lynwood Hope, PA-C: 68 year old male with pertinent  history including current smoker with associated COPD, asthma, GERD on PPI, non-insulin -dependent DM2 (A1c 6.6 on 10/02/2023), hepatitis C s/p treatment with Harvoni 2018, HTN, chronic pain, recent diagnosis of gastric adenocarcinoma on neoadjuvant chemotherapy.  Recent admission in June 2025 for hypovolemic shock in the setting of gastroenteritis.  He had a brief episode of atrial fibrillation during admission.  Echo showed EF >75%, normal RV, no significant valvular abnormalities.  He had subsequent follow-up/preop evaluation with cardiologist Dr. Monetta on 12/20/2023.  Per note, 1.From a cardiology perspective he is optimized for his planned surgical procedure which is a increased risk elective general anesthesia. 2.His cardiac problems include a single brief episode of atrial fibrillation in the setting of acute medical illness I certainly would not commit him to an antiarrhythmic drug without clinical recurrence. 3.Postoperatively he should go to a monitored bed the first 48 hours and I will check daily EKGs on him.  Continue his usual cardiac medications including antihypertensives and his beta-blocker. 4.He will have his anticoagulant withdrawn prior to surgery and resume postoperatively when safe from bleeding perspective. 5.Not uncommon at age 8 to see coronary calcification I do not think he requires a preoperative ischemic evaluation. 6.Continue his current statin with coronary atherosclerosis. 7.Mild enlargement of the ascending aorta will need a follow-up CT scan in 1 to 2 years noncontrast in his age group uncommon to progress to needing intervention. 8.Any problems from a cardiac perspective please contact heart care to become involved in his hospital stay.  Patient reports last dose of Eliquis  01/11/2024.  Preop labs reviewed, unremarkable.  EKG 12/20/23: Normal sinus rhythm.  Rate 70. Abnormal R wave progression  CHEST - 2 VIEW 01/10/2024:   COMPARISON:  Chest radiograph 11/10/2023, CT  11/03/2023   FINDINGS: Right chest port in place. The heart is normal in size. Mediastinal contours are stable. Mild emphysema. No focal airspace disease, pleural effusion or pneumothorax. On limited assessment, no acute osseous findings.   IMPRESSION: 1. No acute chest findings. 2. Mild emphysema.  TTE 11/03/2023: 1. Left ventricular ejection fraction, by estimation, is >75%. The left  ventricle has hyperdynamic function. Indeterminate diastolic filling due  to E-A fusion.   2. Right ventricular systolic function is normal. The right ventricular  size is normal.   3. The mitral valve is grossly normal. No evidence of mitral valve  regurgitation.   4. The aortic valve is tricuspid. Aortic valve regurgitation is not  visualized.    )         Anesthesia Quick Evaluation

## 2024-01-16 NOTE — Progress Notes (Signed)
 Wasted 1.76 mL morphine  into SteriCycle with Caren Nap, RN

## 2024-01-16 NOTE — Progress Notes (Signed)
    1 Day Post-Op  Subjective: No acute issues overnight. Vitals stable. Reports significant pain.   Objective: Vital signs in last 24 hours: Temp:  [97.5 F (36.4 C)-98.6 F (37 C)] 98.2 F (36.8 C) (09/04 0400) Pulse Rate:  [62-78] 72 (09/04 0600) Resp:  [10-22] 15 (09/04 0600) BP: (117-165)/(69-93) 144/81 (09/04 0600) SpO2:  [96 %-100 %] 96 % (09/04 0600) Arterial Line BP: (120-140)/(57) 140/57 (09/03 1745) FiO2 (%):  [28 %] 28 % (09/04 0559) Last BM Date :  (PTA)  Intake/Output from previous day: 09/03 0701 - 09/04 0700 In: 5030.7 [I.V.:3959.2; IV Piggyback:1010] Out: 3145 [Urine:2140; Drains:285; Blood:300; Chest Tube:420] Intake/Output this shift: No intake/output data recorded.  PE: General: resting comfortably, NAD Neuro: alert and oriented, no focal deficits HEENT: NG in place with scant dark brown drainage. Resp: normal work of breathing on nasal cannula, R chest tube to water  seal with serosanguinous drainage. R pleural JP with serosanguinous drainage. CV: RRR Abdomen: soft, nondistended, appropriately tender to palpation. Upper midline incision clean and dry. RUQ JP with serosanguinous drainage. GU: foley draining clear yellow urine   Lab Results:  Recent Labs    01/15/24 1714 01/16/24 0452  WBC 15.1* 18.6*  HGB 11.1* 12.1*  HCT 32.9* 35.1*  PLT 179 190   BMET Recent Labs    01/15/24 1714 01/16/24 0452  NA 136 138  K 3.7 4.3  CL 105 101  CO2 23 23  GLUCOSE 122* 108*  BUN 13 13  CREATININE 0.97 0.89  CALCIUM  8.5* 9.1   PT/INR No results for input(s): LABPROT, INR in the last 72 hours. CMP     Component Value Date/Time   NA 138 01/16/2024 0452   K 4.3 01/16/2024 0452   CL 101 01/16/2024 0452   CO2 23 01/16/2024 0452   GLUCOSE 108 (H) 01/16/2024 0452   BUN 13 01/16/2024 0452   CREATININE 0.89 01/16/2024 0452   CREATININE 0.85 10/10/2023 0824   CALCIUM  9.1 01/16/2024 0452   PROT 6.7 01/10/2024 1130   ALBUMIN  3.4 (L) 01/10/2024  1130   AST 17 01/10/2024 1130   AST 15 10/10/2023 0824   ALT 11 01/10/2024 1130   ALT 11 10/10/2023 0824   ALKPHOS 32 (L) 01/10/2024 1130   BILITOT 0.6 01/10/2024 1130   BILITOT 0.5 10/10/2023 0824   GFRNONAA >60 01/16/2024 0452   GFRNONAA >60 10/10/2023 0824   GFRAA  10/16/2007 1246    >60        The eGFR has been calculated using the MDRD equation. This calculation has not been validated in all clinical   Lipase  No results found for: LIPASE   Assessment/Plan  68 yo male with gastric cardia adenocarcinoma, POD1 s/p total gastrectomy, with distal esophagectomy and roux-en-Y esophagojejunostomy. - Strict NPO, NG tube to remain in place - R chest tube on water  seal per thoracic surgery - J tube in place, capped, anticipate starting J tube feeds in next 1-2 days. No crushed meds via J tube. - Remove foley - Pain control: scheduled IV tylenol  and robaxin , will increase robaxin  dose and dose of morphine  PCA. - Nutrition consult for tube feeding recommendations  - FEN: strict NPO, maintenance IV fluids, NG tube to LIS - VTE: lovenox , SCDs - Dispo: progressive care    LOS: 1 day    Leonor Dawn, MD [facility] Surgery General, Hepatobiliary and Pancreatic Surgery 01/16/24 7:47 AM

## 2024-01-16 NOTE — Transfer of Care (Signed)
 Immediate Anesthesia Transfer of Care Note  Patient: [name]  Procedure(s) Performed: ROBOT-ASSISTED THORACOSCOPY REPAIR OF ESOPHAGEAL LEAK USING MYRIAD MATRIX (Chest)  Patient Location: PACU  Anesthesia Type:General  Level of Consciousness: awake and oriented  Airway & Oxygen Therapy: Patient Spontanous Breathing and Patient connected to face mask oxygen  Post-op Assessment: Report given to RN and Post -op Vital signs reviewed and stable  Post vital signs: Reviewed and stable  Last Vitals:  Vitals Value Taken Time  BP 149/85 01/16/24 17:45  Temp 36.8 C 01/16/24 17:45  Pulse 92 01/16/24 17:51  Resp 23 01/16/24 17:51  SpO2 96 % 01/16/24 17:51  Vitals shown include unfiled device data.  Last Pain:  Vitals:   01/16/24 1406  TempSrc:   PainSc: 10-Worst pain ever         Complications: No notable events documented.

## 2024-01-16 NOTE — Progress Notes (Signed)
 PT Cancellation Note  Patient Details Name: Frisco Cordts MRN: [id number] DOB: Nov 03, 1955   Cancelled Treatment:    Reason Eval/Treat Not Completed: Other (comment) Notified by RN that pt might return to OR.  Aleck Daring, PT, DPT Acute Rehabilitation Services Office [phone]    Alayne ONEIDA Daring 01/16/2024, 12:09 PM

## 2024-01-16 NOTE — Progress Notes (Signed)
 Initial Nutrition Assessment  DOCUMENTATION CODES:   Severe malnutrition in context of chronic illness  INTERVENTION:  Initiate trickle tube feeding via J tube when medically feasible : Vital 1.5 at 20 ml/hr (480 ml per day) Prosource TF20 60 ml QID  Provides 1,040 kcal, 112 gm protein, 366 ml free water  daily  When pt shows tolerance to J-tube feeds recommend: Vital 1.5 at 20 ml/hr and increase by 10 ml every 8 hour or at MD discretion until goal of 60 ml/hr (1440 ml/day) Prosource TF20 60 ml BID  Provides 2320 kcal, 137 gm protein, 1100 ml free water  daily  Monitor magnesium , potassium, and phosphorus daily for at least 3 days when tube feeds started , MD to replete as needed, as pt is at risk for refeeding syndrome 100 mg Thiamine x 7 days  MVI with minerals daily  Monitor for diet advancement  If pt does not tolerate enteral feeding recommend TPN   *Will start pt on a peptide based formula such as Vital to promote tolerance and then will switch to a polymeric formula for home use before discharge.  NUTRITION DIAGNOSIS:   Severe Malnutrition related to cancer and cancer related treatments as evidenced by severe muscle depletion, moderate fat depletion, energy intake < or equal to 75% for > or equal to 1 month, percent weight loss (has lost 28 lbs, 15% in 6 months).  GOAL:   Patient will meet greater than or equal to 90% of their needs  MONITOR:   Diet advancement, TF tolerance, Labs, I & O's, Skin  REASON FOR ASSESSMENT:   Consult Assessment of nutrition requirement/status, Enteral/tube feeding initiation and management  ASSESSMENT:   [name] is a 68 yo male who was diagnosed with uT2N0 adenocarcinoma of the gastric cardia earlier this year. EUS showed evidence of invasion into the GE junction. The patient completed 3 cycles neoadjuvant FLOT with some complications, and was not able to tolerate further chemotherapy. Presented to [facility] for surgery now post op  total gastrectomy with distal esophagectomy, roux-en-Y esophagojejunostomy, J-tube. PMH of T2DM, GERD, HTN, a-fib, hernia repair.  9/3 - total gastrectomy with distal esophagectomy, roux-en-Y esophagojejunostomy, J-tube, chest tube, NGT placed to lIS  Pt resting in bed complains of back pain, R side abdominal pain, and chest pain. Daughter and son-in-law at bedside.   Pt was diagnosed with gastric cancer in March of this year. He underwent chemotherapy 3 times with the goal being 4 however pt was unable tolerate further chemotherapy. Pt reports when he was doing chemotherapy he had no appetite or desire to eat. Was only drinking 2 Ensures/Boosts or premier protein shakes during this time. His last chemotherapy session was in June. Pt reports he feels much better since stopping chemo and has been able to progressively increase his eating. Has been able to eat 3 small mels per day and continues to drink 2 Protein shakes per day.   Pt has been losing weight with significant muscle loss noted especially in legs with moderate fat depletions on exam. From office visit with GI 6 months ago, pt  weighed 206 lbs and he has lost 28 lbs, 15% in 6 months. This is clinically significant and is concerning given pt's diagnosis and recent surgery.  Was able to do ADL independently PTA. Working with home PT PTA as well. Has no upper teeth or dentures and is not able to eat hard crunchy foods. Meats have to be tender. Discussed what  diet progression may look like and  what tube feedig can entail with pt and family. Answered any questions they had.   Pt with NGT in place to LIS. Consult received to perform assessment and tube feed recommendations for when pt is able to start tube feeds. Anticipate starting J-tube feeds in the next 1-2 days. Pt is severely malnourished recommend starting trickle tube feeds as soon as able and monitor tolerance. If pt cannot tolerate J-tube feeds for a prolonged period of time recommend  starting TPN in addition to enteral nutrition. Monitory Mag, Phos, Potassium and add Thiamine when able.  Pt back in OR today for thoracoscopy.   Dietary Recall PTA: Breakfast: Egg sandwoch Lunch: pimento cheese sandwich or banana sandwich with PB and mayo Dinner: chicken + mashed potaoes No snacks Drinks: Water  and unsweet tea   Intake/Output Summary (Last 24 hours) at 01/16/2024 1430 Last data filed at 01/16/2024 1206 Gross per 24 hour  Intake 2739.19 ml  Output 3175 ml  Net -435.81 ml   Drains/Lines: Chest tube: 360 ml x 24 hours with brown looking liquid JP abdomen drain: 245 ml x 24 hours  JP chest drain: 40 ml x 24 hours NGT: 0 ml x 24 hours    Admit weight: 80.7 kg Current weight: 80.7 kg   Average Meal Intake: NPO  Nutritionally Relevant Medications: Scheduled Meds:  chlorhexidine   15 mL Mouth/Throat Once   Or   mouth rinse  15 mL Mouth Rinse Once   chlorhexidine        [MAR Hold] enoxaparin  (LOVENOX ) injection  40 mg Subcutaneous Q24H   [MAR Hold] free water   30 mL Per Tube Q8H   [MAR Hold] insulin  aspart  0-15 Units Subcutaneous Q4H   [MAR Hold] lidocaine   1 patch Transdermal Q24H   [MAR Hold] methocarbamol  (ROBAXIN ) injection  1,000 mg Intravenous Q8H   [MAR Hold] metoprolol  tartrate  5 mg Intravenous Q8H   [MAR Hold] morphine    Intravenous Q4H   Continuous Infusions:  lactated ringers  Stopped (01/16/24 1331)   lactated ringers      Labs Reviewed: Magnesium  1.4 CBG ranges from 91-123 mg/dL over the last 24 hours HgbA1c 5.7  NUTRITION - FOCUSED PHYSICAL EXAM:  Flowsheet Row Most Recent Value  Orbital Region Mild depletion  Upper Arm Region Moderate depletion  Thoracic and Lumbar Region Moderate depletion  Buccal Region Mild depletion  Temple Region Mild depletion  Clavicle Bone Region Moderate depletion  Clavicle and Acromion Bone Region Moderate depletion  Scapular Bone Region Moderate depletion  Dorsal Hand Mild depletion  Patellar Region Severe  depletion  Anterior Thigh Region Severe depletion  Posterior Calf Region Unable to assess  [Leg cuffs]  Edema (RD Assessment) None  Hair Reviewed  Eyes Reviewed  Mouth Reviewed  [No upper teeth]  Skin Reviewed  Nails Reviewed    Diet Order:   Diet Order             Diet NPO time specified  Diet effective now                   EDUCATION NEEDS:   Education needs have been addressed  Skin:  Skin Assessment: Skin Integrity Issues: Skin Integrity Issues:: Incisions Incisions: Closed surgical incision, abdomen  Last BM:  PTA  Height:   Ht Readings from Last 1 Encounters:  01/16/24 5' 11 (1.803 m)    Weight:   Wt Readings from Last 1 Encounters:  01/16/24 80.7 kg    Ideal Body Weight:  78.2 kg  BMI:  Body mass  index is 24.83 kg/m.  Estimated Nutritional Needs:   Kcal:  2300-2500 kcal  Protein:  120-140 gm  Fluid:  >/=2L/day  Olivia Kenning, RD Registered Dietitian  See Amion for more information

## 2024-01-16 NOTE — Anesthesia Procedure Notes (Signed)
 Procedure Name: Intubation Date/Time: 01/16/2024 3:36 PM  Performed by: Cindie Donald CROME, CRNAPre-anesthesia Checklist: Patient identified, Emergency Drugs available, Suction available and Patient being monitored Patient Re-evaluated:Patient Re-evaluated prior to induction Oxygen Delivery Method: Circle System Utilized Preoxygenation: Pre-oxygenation with 100% oxygen Induction Type: IV induction, Rapid sequence and Cricoid Pressure applied Laryngoscope Size: Mac and 4 Grade View: Grade I Endobronchial tube: Left, Double lumen EBT, EBT position confirmed by auscultation and EBT position confirmed by fiberoptic bronchoscope and 39 Fr Number of attempts: 1 Airway Equipment and Method: Stylet Placement Confirmation: ETT inserted through vocal cords under direct vision, positive ETCO2 and breath sounds checked- equal and bilateral Tube secured with: Tape Dental Injury: Teeth and Oropharynx as per pre-operative assessment

## 2024-01-17 ENCOUNTER — Encounter (HOSPITAL_COMMUNITY): Payer: Self-pay | Admitting: Thoracic Surgery (Cardiothoracic Vascular Surgery)

## 2024-01-17 ENCOUNTER — Inpatient Hospital Stay (HOSPITAL_COMMUNITY)

## 2024-01-17 ENCOUNTER — Encounter: Payer: Self-pay | Admitting: Hematology

## 2024-01-17 ENCOUNTER — Other Ambulatory Visit: Payer: Self-pay

## 2024-01-17 DIAGNOSIS — C160 Malignant neoplasm of cardia: Secondary | ICD-10-CM | POA: Diagnosis not present

## 2024-01-17 DIAGNOSIS — I4891 Unspecified atrial fibrillation: Secondary | ICD-10-CM

## 2024-01-17 HISTORY — DX: Unspecified atrial fibrillation: I48.91

## 2024-01-17 LAB — BASIC METABOLIC PANEL WITH GFR
Anion gap: 11 (ref 5–15)
BUN: 21 mg/dL (ref 8–23)
CO2: 26 mmol/L (ref 22–32)
Calcium: 9.1 mg/dL (ref 8.9–10.3)
Chloride: 100 mmol/L (ref 98–111)
Creatinine, Ser: 1 mg/dL (ref 0.61–1.24)
GFR, Estimated: >60 mL/min (ref >60)
Glucose, Bld: 123 mg/dL — ABNORMAL HIGH (ref 70–99)
Potassium: 4.4 mmol/L (ref 3.5–5.1)
Sodium: 137 mmol/L (ref 135–145)

## 2024-01-17 LAB — GLUCOSE, CAPILLARY
Glucose-Capillary: 106 mg/dL — ABNORMAL HIGH (ref 70–99)
Glucose-Capillary: 120 mg/dL — ABNORMAL HIGH (ref 70–99)
Glucose-Capillary: 122 mg/dL — ABNORMAL HIGH (ref 70–99)
Glucose-Capillary: 123 mg/dL — ABNORMAL HIGH (ref 70–99)
Glucose-Capillary: 123 mg/dL — ABNORMAL HIGH (ref 70–99)
Glucose-Capillary: 126 mg/dL — ABNORMAL HIGH (ref 70–99)

## 2024-01-17 LAB — POCT I-STAT EG7
Acid-Base Excess: 0 mmol/L (ref 0.0–2.0)
Bicarbonate: 27.6 mmol/L (ref 20.0–28.0)
Calcium, Ion: 1.23 mmol/L (ref 1.15–1.40)
HCT: 35 % — ABNORMAL LOW (ref 39.0–52.0)
Hemoglobin: 11.9 g/dL — ABNORMAL LOW (ref 13.0–17.0)
O2 Saturation: 52 %
Potassium: 4.6 mmol/L (ref 3.5–5.1)
Sodium: 134 mmol/L — ABNORMAL LOW (ref 135–145)
TCO2: 29 mmol/L (ref 22–32)
pCO2, Ven: 57.1 mmHg (ref 44–60)
pH, Ven: 7.292 (ref 7.25–7.43)
pO2, Ven: 32 mmHg (ref 32–45)

## 2024-01-17 LAB — CBC
HCT: 33.5 % — ABNORMAL LOW (ref 39.0–52.0)
Hemoglobin: 11.3 g/dL — ABNORMAL LOW (ref 13.0–17.0)
MCH: 33.3 pg (ref 26.0–34.0)
MCHC: 33.7 g/dL (ref 30.0–36.0)
MCV: 98.8 fL (ref 80.0–100.0)
Platelets: 189 K/uL (ref 150–400)
RBC: 3.39 MIL/uL — ABNORMAL LOW (ref 4.22–5.81)
RDW: 12.6 % (ref 11.5–15.5)
WBC: 18.6 K/uL — ABNORMAL HIGH (ref 4.0–10.5)
nRBC: 0 % (ref 0.0–0.2)

## 2024-01-17 MED ORDER — DILTIAZEM LOAD VIA INFUSION
20.0000 mg | Freq: Once | INTRAVENOUS | Status: AC
  Administered 2024-01-17: 20 mg via INTRAVENOUS
  Filled 2024-01-17: qty 20

## 2024-01-17 MED ORDER — DEXTROSE-SODIUM CHLORIDE 5-0.45 % IV SOLN: INTRAVENOUS | Status: DC

## 2024-01-17 MED ORDER — METOPROLOL TARTRATE 5 MG/5ML IV SOLN
5.0000 mg | Freq: Once | INTRAVENOUS | Status: AC
  Administered 2024-01-17: 5 mg via INTRAVENOUS

## 2024-01-17 MED ORDER — DIAZEPAM 5 MG/ML IJ SOLN: 5.0000 mg | Freq: Three times a day (TID) | INTRAMUSCULAR | Status: DC | PRN

## 2024-01-17 MED ORDER — DILTIAZEM HCL-DEXTROSE 125-5 MG/125ML-% IV SOLN (PREMIX)
5.0000 mg/h | INTRAVENOUS | Status: DC
  Administered 2024-01-17: 5 mg/h via INTRAVENOUS
  Administered 2024-01-18 – 2024-01-20 (×5): 15 mg/h via INTRAVENOUS
  Administered 2024-01-20: 5 mg/h via INTRAVENOUS
  Administered 2024-01-21: 15 mg/h via INTRAVENOUS
  Administered 2024-01-21: 7.5 mg/h via INTRAVENOUS
  Administered 2024-01-22: 15 mg/h via INTRAVENOUS
  Administered 2024-01-22: 10 mg/h via INTRAVENOUS
  Administered 2024-01-23 (×2): 5 mg/h via INTRAVENOUS
  Filled 2024-01-17 (×17): qty 125

## 2024-01-17 NOTE — Progress Notes (Signed)
 Trauma Event Note   Non trauma pt-- gen surgery pt--   This TRN walking by gen surg pt's room, the 4NP charge RN was doing an EKG- pt in Afib, RVR rate of 150. BP 132/93. Dr. Sebastian notified- orders received for Metoprolol  5mg  IV x 1 dose, EKG done, CXR ordered.    Last imported Vital Signs BP (!) 164/94 (BP Location: Left Arm)   Pulse 89   Temp 98.6 F (37 C) (Oral)   Resp 18   Ht 5' 11 (1.803 m)   Wt 178 lb (80.7 kg)   SpO2 97%   BMI 24.83 kg/m   Trending CBC Recent Labs    01/15/24 1714 01/16/24 0452 01/16/24 1708 01/17/24 0506  WBC 15.1* 18.6*  --  18.6*  HGB 11.1* 12.1* 11.9* 11.3*  HCT 32.9* 35.1* 35.0* 33.5*  PLT 179 190  --  189    Trending Coag's No results for input(s): APTT, INR in the last 72 hours.  Trending BMET Recent Labs    01/15/24 1714 01/16/24 0452 01/16/24 1708 01/17/24 0506  NA 136 138 134* 137  K 3.7 4.3 4.6 4.4  CL 105 101  --  100  CO2 23 23  --  26  BUN 13 13  --  21  CREATININE 0.97 0.89  --  1.00  GLUCOSE 122* 108*  --  123*      Orry Sigl M Oda Placke  Trauma Response RN  Please call TRN at [phone] for further assistance.
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
## 2024-01-17 NOTE — Progress Notes (Signed)
 Patient ID: [name], male   DOB: 01-18-1956, 68 y.o.   MRN: [id number] Notified by RN patient is in AF RVR. BP OK. Lopressor  5mg  x 1 given with no response. On his pre-op Cardiology note it mentions he had this one time before during an acute illness. I consulted Dr. Mona to help manage.  Jon Hummer, MD, MPH, FACS Please use [email or web address] to contact on call provider

## 2024-01-17 NOTE — Progress Notes (Addendum)
 1 Day Post-Op Procedure(s) (LRB): ROBOT-ASSISTED THORACOSCOPY REPAIR OF ESOPHAGEAL LEAK USING MYRIAD MATRIX (N/A) Subjective: Some pain but feels a little better  Objective: Vital signs in last 24 hours: Temp:  [98.1 F (36.7 C)-99.3 F (37.4 C)] 98.3 F (36.8 C) (09/05 0735) Pulse Rate:  [75-103] 84 (09/05 0735) Cardiac Rhythm: Normal sinus rhythm (09/05 0700) Resp:  [17-31] 18 (09/05 0735) BP: (127-157)/(71-102) 143/85 (09/05 0735) SpO2:  [91 %-96 %] 95 % (09/05 0735) FiO2 (%):  [0 %] 0 % (09/04 1843) Weight:  [80.7 kg] 80.7 kg (09/04 1341)  Hemodynamic parameters for last 24 hours:    Intake/Output from previous day: 09/04 0701 - 09/05 0700 In: 1985.3 [I.V.:1595.3; NG/GT:90; IV Piggyback:300] Out: 2417 [Urine:1550; Emesis/NG output:100; Drains:132; Blood:25; Chest Tube:610] Intake/Output this shift: No intake/output data recorded.  General appearance: alert, cooperative, fatigued, and no distress Heart: regular rate and rhythm Lungs: clear anteriorly Abdomen: + incis pain, scant BS Extremities: no edema  Wound: incis healing well  Lab Results: Recent Labs    01/16/24 0452 01/16/24 1708 01/17/24 0506  WBC 18.6*  --  18.6*  HGB 12.1* 11.9* 11.3*  HCT 35.1* 35.0* 33.5*  PLT 190  --  189   BMET:  Recent Labs    01/16/24 0452 01/16/24 1708 01/17/24 0506  NA 138 134* 137  K 4.3 4.6 4.4  CL 101  --  100  CO2 23  --  26  GLUCOSE 108*  --  123*  BUN 13  --  21  CREATININE 0.89  --  1.00  CALCIUM  9.1  --  9.1    PT/INR: No results for input(s): LABPROT, INR in the last 72 hours. ABG    Component Value Date/Time   PHART 7.307 (L) 01/15/2024 1459   HCO3 27.6 01/16/2024 1708   TCO2 29 01/16/2024 1708   ACIDBASEDEF 2.0 01/15/2024 1459   O2SAT 52 01/16/2024 1708   CBG (last 3)  Recent Labs    01/16/24 2321 01/17/24 0350 01/17/24 0756  GLUCAP 131* 123* 123*    Meds Scheduled Meds:  Chlorhexidine  Gluconate Cloth  6 each Topical Daily    enoxaparin  (LOVENOX ) injection  40 mg Subcutaneous Q24H   free water   30 mL Per Tube Q8H   insulin  aspart  0-15 Units Subcutaneous Q4H   lidocaine   1 patch Transdermal Q24H   methocarbamol  (ROBAXIN ) injection  1,000 mg Intravenous Q8H   metoprolol  tartrate  5 mg Intravenous Q8H   morphine    Intravenous Q4H   Continuous Infusions:  dextrose  5 % and 0.45 % NaCl 75 mL/hr at 01/17/24 0727   PRN Meds:.albuterol , diazepam , diphenhydrAMINE , hydrALAZINE , naloxone  **AND** sodium chloride  flush, ondansetron  (ZOFRAN ) IV  Xrays DG Chest 1 View Result Date: 01/17/2024 CLINICAL DATA:  Right chest tube.  History of gastric cancer. EXAM: CHEST  1 VIEW COMPARISON:  01/16/2024 FINDINGS: The cardio pericardial silhouette is enlarged. Bibasilar atelectasis noted, left greater than right. No substantial pleural effusion. Basilar right-sided pneumothorax again noted with right chest tube in place. A second apparent thoracic drain tracks along the dome of the right hemidiaphragm, superimposed on the heart with the tip overlying the right hilar region. This may be new or represent repositioning of a right-sided chest tube present previously. NG tube tip is positioned in the proximal stomach with proximal side port at the level of the distal esophagus. Telemetry leads overlie the chest. IMPRESSION: 1. Small basilar right-sided pneumothorax with right chest tube in place. 2. Bibasilar atelectasis, left greater than right.  3. NG tube tip is in the proximal stomach with proximal side port at the level of the distal esophagus. Electronically Signed   By: Camellia Candle M.D.   On: 01/17/2024 07:34   DG Chest 1 View Result Date: 01/16/2024 CLINICAL DATA:  [id number] Follow-up exam [id number] EXAM: CHEST  1 VIEW COMPARISON:  January 10, 2024 FINDINGS: Right-sided chest port terminating in the mid SVC. Right-sided thoracostomy tube directed towards the right lung apex. There is also a right-sided chest tube directed towards the medial right  lung base, abutting the mediastinum. Esophagogastric tube terminates in the region of the stomach with the last side hole at the GE junction. Lower lung volumes. No focal airspace consolidation. Patchy opacities in the lung bases, likely atelectasis. Small left pleural effusion. Small sub pulmonic pneumothorax along the right lung base and lateral right chest wall. No cardiomegaly. Subcutaneous emphysema along the right chest wall. IMPRESSION: 1. Small subpulmonic right pneumothorax with 2 right-sided chest tubes in place. 2. Low lung volumes with patchy opacities in both lung bases, likely atelectasis. 3. Esophagogastric tube terminates in the region of the stomach. Electronically Signed   By: Rogelia Myers M.D.   On: 01/16/2024 08:20    Assessment/Plan: S/P Procedure(s) (LRB): ROBOT-ASSISTED THORACOSCOPY REPAIR OF ESOPHAGEAL LEAK USING MYRIAD MATRIX (N/A) POD#2/1  1 Tmax 99.3, VSS, sinus rhythm 2 O2 sats good on 4 liters 3 NGT 90 ml/24 h, bilious 4 good UOP 5 chest JP 67 ml since OR yesterday- serosang 6 CT 610 ml /24 h recorded, 210 in last 12 h 7 CXR- small lateral pntx at base, some bibasilar atx 8 will keep all drains in place till at least swallow study- can start JP feedings from CT surgery perspective   LOS: 2 days    Lemond FORBES Cera PA-C Pager [phone] 01/17/2024  Serosanguineous chest tube output. Continue to gravity drainage.  Niclas Markell MALVA Rayas

## 2024-01-17 NOTE — Progress Notes (Signed)
 1 Day Post-Op  Subjective: Developed bilious output in chest tube yesterday and was taken back to OR yesterday afternoon for EGD and thoracoscopy, wound matrix applied to EJ anastomosis. Drains all appear serosanguinous this morning. Patient reports continued pain at both abdominal and chest incisions.   Objective: Vital signs in last 24 hours: Temp:  [98.1 F (36.7 C)-99.3 F (37.4 C)] 99.1 F (37.3 C) (09/05 0347) Pulse Rate:  [75-103] 91 (09/05 0548) Resp:  [17-31] 19 (09/05 0548) BP: (127-157)/(71-102) 143/88 (09/05 0548) SpO2:  [91 %-96 %] 94 % (09/05 0548) FiO2 (%):  [0 %] 0 % (09/04 1843) Weight:  [80.7 kg] 80.7 kg (09/04 1341) Last BM Date :  (PTA)  Intake/Output from previous day: 09/04 0701 - 09/05 0700 In: 1985.3 [I.V.:1595.3; NG/GT:90; IV Piggyback:300] Out: 1982 [Urine:1250; Emesis/NG output:100; Drains:97; Blood:25; Chest Tube:510] Intake/Output this shift: Total I/O In: 755.3 [I.V.:695.3; NG/GT:60] Out: 650 [Urine:400; Emesis/NG output:100; Drains:40; Chest Tube:110]  PE: General: resting comfortably, NAD Neuro: alert and oriented, no focal deficits HEENT: NG in place with minimal bilious fluid Resp: normal work of breathing on nasal cannula, R chest tube to water  seal with serosanguinous drainage. R pleural JP with serosanguinous drainage. CV: RRR Abdomen: soft, mildly distended, appropriately tender to palpation. Upper midline incision clean and dry. RUQ JP with serosanguinous drainage. GU: foley draining clear yellow urine   Lab Results:  Recent Labs    01/16/24 0452 01/16/24 1708 01/17/24 0506  WBC 18.6*  --  18.6*  HGB 12.1* 11.9* 11.3*  HCT 35.1* 35.0* 33.5*  PLT 190  --  189   BMET Recent Labs    01/16/24 0452 01/16/24 1708 01/17/24 0506  NA 138 134* 137  K 4.3 4.6 4.4  CL 101  --  100  CO2 23  --  26  GLUCOSE 108*  --  123*  BUN 13  --  21  CREATININE 0.89  --  1.00  CALCIUM  9.1  --  9.1   PT/INR No results for input(s):  LABPROT, INR in the last 72 hours. CMP     Component Value Date/Time   NA 137 01/17/2024 0506   K 4.4 01/17/2024 0506   CL 100 01/17/2024 0506   CO2 26 01/17/2024 0506   GLUCOSE 123 (H) 01/17/2024 0506   BUN 21 01/17/2024 0506   CREATININE 1.00 01/17/2024 0506   CREATININE 0.85 10/10/2023 0824   CALCIUM  9.1 01/17/2024 0506   PROT 6.7 01/10/2024 1130   ALBUMIN  3.4 (L) 01/10/2024 1130   AST 17 01/10/2024 1130   AST 15 10/10/2023 0824   ALT 11 01/10/2024 1130   ALT 11 10/10/2023 0824   ALKPHOS 32 (L) 01/10/2024 1130   BILITOT 0.6 01/10/2024 1130   BILITOT 0.5 10/10/2023 0824   GFRNONAA >60 01/17/2024 0506   GFRNONAA >60 10/10/2023 0824   GFRAA  10/16/2007 1246    >60        The eGFR has been calculated using the MDRD equation. This calculation has not been validated in all clinical   Lipase  No results found for: LIPASE   Assessment/Plan  68 yo male with gastric cardia adenocarcinoma, POD2 s/p total gastrectomy, with distal esophagectomy and roux-en-Y esophagojejunostomy, POD1 s/p takeback by thoracic surgery for RATS repair of anastomotic leak. - Strict NPO, NG tube to remain in place - R chest tube on water  seal per thoracic surgery, pleural JP to gravity, no suction - J tube in place, capped, flush with free water  TID  to keep patent. Hold on enteral feed initiation given abdominal distension. Awaiting bowel function. - Remove foley today, kept in place yesterday for OR - Pain control: scheduled IV tylenol  and robaxin , continue morphine  PCA, will add prn valium  for muscle spasms - FEN: strict NPO, maintenance IV fluids, NG tube to LIS - VTE: lovenox , SCDs - Dispo: progressive care    LOS: 2 days    Leonor Dawn, MD [facility] Surgery General, Hepatobiliary and Pancreatic Surgery 01/17/24 6:51 AM

## 2024-01-17 NOTE — Progress Notes (Signed)
 1715-Pt noted to have new onset A-fib RVR. HR sustaining between 140-160s, BP and SpO2 stable pt on 4L [place]. Pt c/o CP and SOB. EKG obtained. Dr. Sebastian notified. See new order for chest xray and Metoprolol  5 mg IV once.   1730- Metoprolol  administered. No changes in HR noted pt SpO2 88% on 4L increased to 6L. SpO2 improved to 91%.   1745- Dr. Sebastian at bedside. See new orders.

## 2024-01-17 NOTE — Plan of Care (Signed)
 NEURO: A/O x4 CV: Elevated BP/RR, no fever; chest tube with 210 total output this shift, JP Drains intact RESP: On 4 lpm oxygen, SpO2 >92% GI/GU: NPO; NG Tube to low intermittent wall suction (bile drainage); hypoactive bowel sounds Patient pain is controlled by Morphine  PCA  Problem: Education: Goal: Ability to describe self-care measures that may prevent or decrease complications (Diabetes Survival Skills Education) will improve Outcome: Progressing Goal: Individualized Educational Video(s) Outcome: Progressing   Problem: Coping: Goal: Ability to adjust to condition or change in health will improve Outcome: Progressing   Problem: Fluid Volume: Goal: Ability to maintain a balanced intake and output will improve Outcome: Progressing   Problem: Health Behavior/Discharge Planning: Goal: Ability to identify and utilize available resources and services will improve Outcome: Progressing Goal: Ability to manage health-related needs will improve Outcome: Progressing   Problem: Metabolic: Goal: Ability to maintain appropriate glucose levels will improve Outcome: Progressing   Problem: Nutritional: Goal: Maintenance of adequate nutrition will improve Outcome: Progressing Goal: Progress toward achieving an optimal weight will improve Outcome: Progressing   Problem: Skin Integrity: Goal: Risk for impaired skin integrity will decrease Outcome: Progressing   Problem: Tissue Perfusion: Goal: Adequacy of tissue perfusion will improve Outcome: Progressing   Problem: Education: Goal: Knowledge of General Education information will improve Description: Including pain rating scale, medication(s)/side effects and non-pharmacologic comfort measures Outcome: Progressing   Problem: Clinical Measurements: Goal: Will remain free from infection Outcome: Progressing Goal: Respiratory complications will improve Outcome: Progressing   Problem: Activity: Goal: Risk for activity intolerance  will decrease Outcome: Progressing   Problem: Pain Managment: Goal: General experience of comfort will improve and/or be controlled Outcome: Progressing   Problem: Safety: Goal: Ability to remain free from injury will improve Outcome: Progressing

## 2024-01-17 NOTE — Anesthesia Postprocedure Evaluation (Signed)
 Anesthesia Post Note  Patient: [name]  Procedure(s) Performed: ROBOT-ASSISTED THORACOSCOPY REPAIR OF ESOPHAGEAL LEAK USING MYRIAD MATRIX (Chest)     Patient location during evaluation: PACU Anesthesia Type: General Level of consciousness: awake and alert Pain management: pain level controlled Vital Signs Assessment: post-procedure vital signs reviewed and stable Respiratory status: spontaneous breathing, nonlabored ventilation, respiratory function stable and patient connected to nasal cannula oxygen Cardiovascular status: blood pressure returned to baseline and stable Postop Assessment: no apparent nausea or vomiting Anesthetic complications: no   No notable events documented.  Last Vitals:    Last Pain:                 [name]

## 2024-01-17 NOTE — Evaluation (Signed)
 Physical Therapy Evaluation Patient Details Name: [name] MRN: [id number] DOB: 12-May-1956 Today's Date: 01/17/2024  History of Present Illness  68 y.o. male presents to [facility] hospital on 01/15/2024 with gastric CA. Pt underwent total gastrectomy with omentectomy and D2 lymphadenectomy, esophagojejunostomy with Jtube placement, splenic biopsy on 9/3. Pt developed bilious output into chest tube on 9/4, returned to OR for EGD and thoracoscopy with application of wound matrix to EJ anastomosis. PMH includes asthma, OA, COPD, GERD, DMII, MDD, emphysema.  Clinical Impression  Pt presents to PT with deficits in functional mobility, strength, power, balance, endurance. Pt is mobilizing relatively well considering having multiple major surgeries. Pt is able to stand and transfer with a RW, requiring assistance from PT for line management. Pt demonstrates a poor tolerance for sitting in recliner, requesting to return to bed minutes after reaching recliner. Pt assisted by PT back to bed. PT will follow in an effort to improve activity tolerance and to restore independence in mobility. PT is hopeful the pt progresses quickly and will be able to return home with HHPT.        If plan is discharge home, recommend the following: A little help with walking and/or transfers;A little help with bathing/dressing/bathroom;Assistance with cooking/housework;Assist for transportation;Help with stairs or ramp for entrance   Can travel by private vehicle        Equipment Recommendations BSC/3in1  Recommendations for Other Services       Functional Status Assessment Patient has had a recent decline in their functional status and demonstrates the ability to make significant improvements in function in a reasonable and predictable amount of time.     Precautions / Restrictions Precautions Precautions: Fall;Other (comment) Recall of Precautions/Restrictions: Intact Precaution/Restrictions Comments: R chest tube, R JP  drain x 2, J tube, NG tube, PCA Restrictions Weight Bearing Restrictions Per Provider Order: No      Mobility  Bed Mobility Overal bed mobility: Needs Assistance Bed Mobility: Rolling, Sidelying to Sit, Sit to Supine Rolling: Contact guard assist Sidelying to sit: Contact guard assist, Used rails   Sit to supine: Contact guard assist        Transfers Overall transfer level: Needs assistance Equipment used: Rolling walker (2 wheels) Transfers: Sit to/from Stand, Bed to chair/wheelchair/BSC Sit to Stand: Contact guard assist   Step pivot transfers: Contact guard assist            Ambulation/Gait                  Stairs            Wheelchair Mobility     Tilt Bed    Modified Rankin (Stroke Patients Only)       Balance Overall balance assessment: Needs assistance Sitting-balance support: No upper extremity supported, Feet supported Sitting balance-Leahy Scale: Fair     Standing balance support: Bilateral upper extremity supported, Reliant on assistive device for balance Standing balance-Leahy Scale: Poor                               Pertinent Vitals/Pain Pain Assessment Pain Assessment: Faces Faces Pain Scale: Hurts whole lot Pain Location: abdomen Pain Descriptors / Indicators: Sore Pain Intervention(s): PCA encouraged    Home Living Family/patient expects to be discharged to:: Private residence Living Arrangements: Other relatives (brother) Available Help at Discharge: Family;Available 24 hours/day Type of Home: House Home Access: Stairs to enter Entrance Stairs-Rails: None Entrance Stairs-Number of Steps: 1  Home Layout: Two level;Able to live on main level with bedroom/bathroom Home Equipment: Rolling Walker (2 wheels);Cane - single point Additional Comments: discharging to his brother's home    Prior Function Prior Level of Function : Independent/Modified Independent             Mobility Comments:  ambulatory without DME       Extremity/Trunk Assessment   Upper Extremity Assessment Upper Extremity Assessment: Generalized weakness    Lower Extremity Assessment Lower Extremity Assessment: Generalized weakness    Cervical / Trunk Assessment Cervical / Trunk Assessment: Other exceptions Cervical / Trunk Exceptions: abdominal incision, Jtube  Communication   Communication Communication: No apparent difficulties    Cognition Arousal: Alert Behavior During Therapy: WFL for tasks assessed/performed   PT - Cognitive impairments: No apparent impairments                         Following commands: Intact       Cueing Cueing Techniques: Verbal cues     General Comments General comments (skin integrity, edema, etc.): pt on 4L [place], VSS    Exercises     Assessment/Plan    PT Assessment Patient needs continued PT services  PT Problem List Decreased strength;Decreased activity tolerance;Decreased balance;Decreased mobility;Decreased knowledge of use of DME;Cardiopulmonary status limiting activity;Pain       PT Treatment Interventions Gait training;DME instruction;Stair training;Functional mobility training;Therapeutic activities;Therapeutic exercise;Balance training;Neuromuscular re-education;Patient/family education    PT Goals (Current goals can be found in the Care Plan section)  Acute Rehab PT Goals Patient Stated Goal: to return to independence PT Goal Formulation: With patient Time For Goal Achievement: 01/31/24 Potential to Achieve Goals: Fair    Frequency Min 3X/week     Co-evaluation               AM-PAC PT 6 Clicks Mobility  Outcome Measure Help needed turning from your back to your side while in a flat bed without using bedrails?: A Little Help needed moving from lying on your back to sitting on the side of a flat bed without using bedrails?: A Little Help needed moving to and from a bed to a chair (including a wheelchair)?: A  Little Help needed standing up from a chair using your arms (e.g., wheelchair or bedside chair)?: A Little Help needed to walk in hospital room?: Total Help needed climbing 3-5 steps with a railing? : Total 6 Click Score: 14    End of Session   Activity Tolerance: Patient limited by pain Patient left: in bed;with call bell/phone within reach;with bed alarm set Nurse Communication: Mobility status PT Visit Diagnosis: Other abnormalities of gait and mobility (R26.89);Muscle weakness (generalized) (M62.81);Pain Pain - part of body:  (abdomen)    Time: 8597-8564 PT Time Calculation (min) (ACUTE ONLY): 33 min   Charges:   PT Evaluation $PT Eval Low Complexity: 1 Low PT Treatments $Therapeutic Activity: 8-22 mins PT General Charges $$ ACUTE PT VISIT: 1 Visit         Bernardino JINNY Ruth, PT, DPT Acute Rehabilitation Office [phone]   Bernardino JINNY Ruth 01/17/2024, 2:48 PM

## 2024-01-17 NOTE — Progress Notes (Signed)
   01/17/24 1200  TOC Brief Assessment  Insurance and Status Reviewed  Patient has primary care physician Yes  Home environment has been reviewed home  Prior level of function: self/independent  Prior/Current Home Services No current home services  Social Drivers of Health Review SDOH reviewed no interventions necessary  Readmission risk has been reviewed Yes  Transition of care needs no transition of care needs at this time    Pt s/p total gastrectomy and R-RATS. Post op tubes/drains (NGT,CT,JP) Has sister that is support person.  PT eval pending.  Inpatient Care Management (ICM) will continue to monitor patient advancement through interdisciplinary progression rounds. If new patient transition needs arise, please place a ICM (CM/CSW) consult. Anticipate pt may have needs pending post op course and progression.

## 2024-01-18 ENCOUNTER — Inpatient Hospital Stay (HOSPITAL_COMMUNITY)

## 2024-01-18 DIAGNOSIS — I4819 Other persistent atrial fibrillation: Secondary | ICD-10-CM | POA: Diagnosis not present

## 2024-01-18 LAB — BASIC METABOLIC PANEL WITH GFR
Anion gap: 10 (ref 5–15)
BUN: 16 mg/dL (ref 8–23)
CO2: 22 mmol/L (ref 22–32)
Calcium: 8.8 mg/dL — ABNORMAL LOW (ref 8.9–10.3)
Chloride: 103 mmol/L (ref 98–111)
Creatinine, Ser: 0.82 mg/dL (ref 0.61–1.24)
GFR, Estimated: >60 mL/min (ref >60)
Glucose, Bld: 116 mg/dL — ABNORMAL HIGH (ref 70–99)
Potassium: 3.8 mmol/L (ref 3.5–5.1)
Sodium: 135 mmol/L (ref 135–145)

## 2024-01-18 LAB — GLUCOSE, CAPILLARY
Glucose-Capillary: 116 mg/dL — ABNORMAL HIGH (ref 70–99)
Glucose-Capillary: 136 mg/dL — ABNORMAL HIGH (ref 70–99)
Glucose-Capillary: 118 mg/dL — ABNORMAL HIGH (ref 70–99)
Glucose-Capillary: 147 mg/dL — ABNORMAL HIGH (ref 70–99)
Glucose-Capillary: 147 mg/dL — ABNORMAL HIGH (ref 70–99)

## 2024-01-18 LAB — CBC
HCT: 32.3 % — ABNORMAL LOW (ref 39.0–52.0)
Hemoglobin: 10.8 g/dL — ABNORMAL LOW (ref 13.0–17.0)
MCH: 33.2 pg (ref 26.0–34.0)
MCHC: 33.4 g/dL (ref 30.0–36.0)
MCV: 99.4 fL (ref 80.0–100.0)
Platelets: 195 K/uL (ref 150–400)
RBC: 3.25 MIL/uL — ABNORMAL LOW (ref 4.22–5.81)
RDW: 12.6 % (ref 11.5–15.5)
WBC: 13.8 K/uL — ABNORMAL HIGH (ref 4.0–10.5)
nRBC: 0 % (ref 0.0–0.2)

## 2024-01-18 MED ORDER — NICOTINE 14 MG/24HR TD PT24
14.0000 mg | MEDICATED_PATCH | Freq: Every day | TRANSDERMAL | Status: DC
  Administered 2024-01-18: 14 mg via TRANSDERMAL
  Filled 2024-01-18: qty 1

## 2024-01-18 MED ORDER — SODIUM CHLORIDE 0.9% FLUSH
10.0000 mL | Freq: Two times a day (BID) | INTRAVENOUS | Status: DC
  Administered 2024-01-18 (×2): 10 mL
  Administered 2024-01-19: 20 mL
  Administered 2024-01-19 – 2024-01-20 (×3): 10 mL
  Administered 2024-01-21: 20 mL
  Administered 2024-01-21 – 2024-02-02 (×24): 10 mL
  Administered 2024-02-03: 20 mL
  Administered 2024-02-03 – 2024-02-04 (×2): 10 mL
  Administered 2024-02-04: 20 mL
  Administered 2024-02-05: 30 mL
  Administered 2024-02-06 – 2024-02-15 (×16): 10 mL
  Administered 2024-02-15: 30 mL
  Administered 2024-02-16 – 2024-02-18 (×5): 10 mL
  Administered 2024-02-18: 30 mL
  Administered 2024-02-19 – 2024-02-21 (×5): 10 mL
  Administered 2024-02-21: 30 mL
  Administered 2024-02-22: 10 mL
  Administered 2024-02-22: 30 mL
  Administered 2024-02-23 – 2024-02-24 (×3): 10 mL
  Administered 2024-02-24 – 2024-02-25 (×2): 20 mL
  Administered 2024-02-25 – 2024-02-26 (×2): 10 mL
  Administered 2024-02-26: 20 mL
  Administered 2024-02-27: 30 mL
  Administered 2024-02-27 – 2024-02-28 (×3): 10 mL
  Administered 2024-02-29: 30 mL
  Administered 2024-02-29: 10 mL
  Administered 2024-03-01: 30 mL
  Administered 2024-03-01 – 2024-03-03 (×4): 10 mL
  Administered 2024-03-04: 30 mL
  Administered 2024-03-04 – 2024-03-05 (×2): 10 mL
  Administered 2024-03-05: 30 mL
  Administered 2024-03-06: 10 mL
  Administered 2024-03-06: 30 mL
  Administered 2024-03-07 – 2024-03-14 (×15): 10 mL

## 2024-01-18 MED ORDER — KETOROLAC TROMETHAMINE 15 MG/ML IJ SOLN
15.0000 mg | Freq: Three times a day (TID) | INTRAMUSCULAR | Status: DC
  Administered 2024-01-18 – 2024-01-20 (×7): 15 mg via INTRAVENOUS
  Filled 2024-01-18 (×8): qty 1

## 2024-01-18 MED ORDER — SODIUM CHLORIDE 0.9% FLUSH
10.0000 mL | INTRAVENOUS | Status: DC | PRN
  Administered 2024-02-28: 30 mL

## 2024-01-18 MED ORDER — DIAZEPAM 5 MG/ML IJ SOLN
5.0000 mg | Freq: Three times a day (TID) | INTRAMUSCULAR | Status: DC
  Administered 2024-01-18 – 2024-01-23 (×15): 5 mg via INTRAVENOUS
  Filled 2024-01-18 (×16): qty 2

## 2024-01-18 MED ORDER — ENOXAPARIN SODIUM 40 MG/0.4ML IJ SOSY
40.0000 mg | PREFILLED_SYRINGE | INTRAMUSCULAR | Status: DC
  Administered 2024-01-19: 40 mg via SUBCUTANEOUS
  Filled 2024-01-18: qty 0.4

## 2024-01-18 MED ORDER — HEPARIN (PORCINE) 25000 UT/250ML-% IV SOLN: 1100.0000 [IU]/h | INTRAVENOUS | Status: DC

## 2024-01-18 NOTE — Progress Notes (Addendum)
 PHARMACY - ANTICOAGULATION CONSULT NOTE  Pharmacy Consult for heparin  gtt Indication: atrial fibrillation  Allergies  Allergen Reactions   Dilaudid  [Hydromorphone ] Itching   Lisinopril Nausea Only    Patient Measurements: Height: 5' 11 (180.3 cm) Weight: 80.7 kg (178 lb) IBW/kg (Calculated) : 75.3 HEPARIN  DW (KG): 80.7  Vital Signs: Temp: 97.7 F (36.5 C) (09/06 0758) Temp Source: Oral (09/06 1137) BP: 99/64 (09/06 1137) Pulse Rate: 86 (09/06 0758)  Labs: Recent Labs    01/16/24 0452 01/16/24 1708 01/17/24 0506 01/18/24 0606  HGB 12.1* 11.9* 11.3* 10.8*  HCT 35.1* 35.0* 33.5* 32.3*  PLT 190  --  189 195  CREATININE 0.89  --  1.00 0.82    Estimated Creatinine Clearance: 93.1 mL/min (by C-G formula based on SCr of 0.82 mg/dL).  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 Pharmacy consulted to dose heparin  gtt while pt is NPO.  Last dose of Eliquis  was 01/11/2024 in preparation for operation CBC Hgb 10.8, Plt 195   Goal of Therapy:  Heparin  level 0.3-0.5  Monitor platelets by anticoagulation protocol: Yes   Plan:  D/c enoxaparin  for DVT prophylaxis No heparin  bolus, low goal given recent procedures Start Heparin  1100 units/hr 6hr HL, aPTT Daily HL F/u transition to PO anticoagulation as appropriate  ---------------  Per MD wait another 48-72h prior to anticoag in high risk post-op setting. MD to reconsult if/when heparin  is needed Sharyne Glatter, PharmD, BCCCP Critical Care Clinical Pharmacist 01/18/2024 1:08 PM

## 2024-01-18 NOTE — Progress Notes (Signed)
   Progress Note  Patient Name: [name] Date of Encounter: 01/18/2024  Primary Cardiologist: Redell Leiter, MD  Interval Summary  Chart reviewed including cardiology consultation by Dr. Mona yesterday.  Patient remains in atrial fibrillation with adequate heart rate control on IV diltiazem  and IV Lopressor .  He does not report any sense of palpitations or shortness of breath at rest.  Still with abdominal discomfort.  Vital Signs  Vitals:   01/18/24 0330 01/18/24 0332 01/18/24 0604 01/18/24 0758  BP: 104/66   119/72  Pulse: 85   86  Resp: 20 16 16 18   Temp: 98.2 F (36.8 C)   97.7 F (36.5 C)  TempSrc: Oral   Oral  SpO2: 93% 92%  (!) 88%  Weight:      Height:        Intake/Output Summary (Last 24 hours) at 01/18/2024 1126 Last data filed at 01/18/2024 0514 Gross per 24 hour  Intake 90 ml  Output 1400 ml  Net -1310 ml   Filed Weights   01/15/24 0642 01/16/24 1341  Weight: 80.7 kg 80.7 kg    Physical Exam  GEN: No acute distress.   Neck: No JVD. Cardiac: Irregularly irregular without significant murmur or gallop.  Respiratory: Nonlabored. Clear to auscultation bilaterally. MS: No edema.  ECG/Telemetry  Telemetry reviewed showing atrial fibrillation.  Labs  Chemistry Recent Labs  Lab 01/16/24 0452 01/16/24 1708 01/17/24 0506 01/18/24 0606  NA 138 134* 137 135  K 4.3 4.6 4.4 3.8  CL 101  --  100 103  CO2 23  --  26 22  GLUCOSE 108*  --  123* 116*  BUN 13  --  21 16  CREATININE 0.89  --  1.00 0.82  CALCIUM  9.1  --  9.1 8.8*  GFRNONAA >60  --  >60 >60  ANIONGAP 14  --  11 10    Hematology Recent Labs  Lab 01/16/24 0452 01/16/24 1708 01/17/24 0506 01/18/24 0606  WBC 18.6*  --  18.6* 13.8*  RBC 3.64*  --  3.39* 3.25*  HGB 12.1* 11.9* 11.3* 10.8*  HCT 35.1* 35.0* 33.5* 32.3*  MCV 96.4  --  98.8 99.4  MCH 33.2  --  33.3 33.2  MCHC 34.5  --  33.7 33.4  RDW 12.2  --  12.6 12.6  PLT 190  --  189 195    Cardiac Studies  No interval cardiac  testing for review today.  Assessment & Plan  1.  Atrial fibrillation, postoperative and persistent at this point.  CHA2DS2-VASc score is 3.  Currently on IV diltiazem  and IV Lopressor  with reasonable heart rate control.  Not anticoagulated at this time.  2.  Postoperative day 3 status post total gastrectomy with distal esophagectomy and Roux-en-Y esophagojejunostomy, postoperative day 2 status post takeback by thoracic surgery for RATS repair of anastomotic leak.  Remains n.p.o. at this time.  Continue IV diltiazem  and IV Lopressor  for heart rate control.  No urgent need for anticoagulation at this time, particularly in the immediate postoperative setting.  This can be reconsidered if arrhythmia persists beyond 48 to 72 hours.  For questions or updates, please contact [facility] Please consult [email or web address] for contact info under   Signed, Jayson Sierras, MD  01/18/2024, 11:26 AM

## 2024-01-18 NOTE — Progress Notes (Signed)
 2 Days Post-Op  Subjective: Went into a-fib with RVR last night, now rate controlled with dilt gtt. O2 sats in high 80s this morning on 6L, reports significant pain and difficulty coughing up secretions. No bowel function yet.   Objective: Vital signs in last 24 hours: Temp:  [97.7 F (36.5 C)-98.6 F (37 C)] 97.7 F (36.5 C) (09/06 0758) Pulse Rate:  [85-163] 86 (09/06 0758) Resp:  [8-29] 18 (09/06 0758) BP: (104-164)/(66-102) 119/72 (09/06 0758) SpO2:  [83 %-97 %] 88 % (09/06 0758) FiO2 (%):  [0 %-28 %] 0 % (09/05 1921) Last BM Date :  (PTA)  Intake/Output from previous day: 09/05 0701 - 09/06 0700 In: 90 [NG/GT:90] Out: 1500 [Urine:1000; Emesis/NG output:250; Drains:90; Chest Tube:160] Intake/Output this shift: No intake/output data recorded.  PE: General: resting comfortably, NAD Neuro: alert and oriented, no focal deficits HEENT: NG in place with minimal bilious fluid Resp: normal work of breathing on nasal cannula, R chest tube to water  seal with serosanguinous drainage. R pleural JP with serosanguinous drainage. CV: RRR Abdomen: soft, mildly distended, appropriately tender to palpation. Upper midline incision clean and dry. RUQ JP with serosanguinous drainage.    Lab Results:  Recent Labs    01/17/24 0506 01/18/24 0606  WBC 18.6* 13.8*  HGB 11.3* 10.8*  HCT 33.5* 32.3*  PLT 189 195   BMET Recent Labs    01/17/24 0506 01/18/24 0606  NA 137 135  K 4.4 3.8  CL 100 103  CO2 26 22  GLUCOSE 123* 116*  BUN 21 16  CREATININE 1.00 0.82  CALCIUM  9.1 8.8*   PT/INR No results for input(s): LABPROT, INR in the last 72 hours. CMP     Component Value Date/Time   NA 135 01/18/2024 0606   K 3.8 01/18/2024 0606   CL 103 01/18/2024 0606   CO2 22 01/18/2024 0606   GLUCOSE 116 (H) 01/18/2024 0606   BUN 16 01/18/2024 0606   CREATININE 0.82 01/18/2024 0606   CREATININE 0.85 10/10/2023 0824   CALCIUM  8.8 (L) 01/18/2024 0606   PROT 6.7 01/10/2024 1130    ALBUMIN  3.4 (L) 01/10/2024 1130   AST 17 01/10/2024 1130   AST 15 10/10/2023 0824   ALT 11 01/10/2024 1130   ALT 11 10/10/2023 0824   ALKPHOS 32 (L) 01/10/2024 1130   BILITOT 0.6 01/10/2024 1130   BILITOT 0.5 10/10/2023 0824   GFRNONAA >60 01/18/2024 0606   GFRNONAA >60 10/10/2023 0824   GFRAA  10/16/2007 1246    >60        The eGFR has been calculated using the MDRD equation. This calculation has not been validated in all clinical   Lipase  No results found for: LIPASE   Assessment/Plan  68 yo male with gastric cardia adenocarcinoma, POD3 s/p total gastrectomy, with distal esophagectomy and roux-en-Y esophagojejunostomy, POD2 s/p takeback by thoracic surgery for RATS repair of anastomotic leak. - Strict NPO, NG tube to remain in place - R chest tube on water  seal per thoracic surgery, pleural JP to gravity, no suction - J tube in place, capped, flush with free water  TID to keep patent. Abdomen remains mildly distended, awaiting bowel function prior to initiation of tube feeds - Oxygen increased to 8L, placed IS and flutter valve at bedside and educated patient on use. Needs aggressive pulmonary toilet. Will try chest PT, although he may not tolerate due to pain. - Pain control: scheduled IV tylenol  and robaxin , continue morphine  PCA, schedule valium , add toradol  -  T2DM: on sliding scale novolog  - A-fib with RVR: already on scheduled IV metoprolol . Cardiology consulted, appreciate recommendations, rate controlled on dilt gtt. May need to anticoagulate if remains in a-fib for >24 hours. - FEN: strict NPO, maintenance IV fluids, NG tube to LIS - VTE: lovenox , SCDs - Dispo: progressive care    LOS: 3 days    Leonor Dawn, MD [facility] Surgery General, Hepatobiliary and Pancreatic Surgery 01/18/24 9:10 AM

## 2024-01-18 NOTE — Progress Notes (Signed)
 Patient re-examined this afternoon after ICU transfer. He has been weaned to 10L nasal cannula and maintaining O2 sats in low 90s. Breathing comfortably. Using flutter valve and IS, pulling about 500 on IS. Remains in a-fib but rate control has improved.

## 2024-01-18 NOTE — Progress Notes (Signed)
 I was notified by RN that patient has had increasing oxygen requirements. Now on 15L nonrebreather with SpO2 95%. On exam he appears comfortable with no increased work of breathing, and denies subjective shortness of breath. Remains in a-fib with rate 100s to 120s, likely exacerbated by hypoxia. IS effort remains poor and cough is weak due to pain. Added on valium  and toradol  this morning to help with pain, which he says has improved some, and he is using PCA. CXR this morning shows atelectasis, no large effusions or infiltrates. Suspect hypoxia is due to poor pulmonary toilet secondary to pain, weak cough, and underlying emphysema. I emphasized the importance of using IS frequently and flutter valve. Needs to try percussive chest PT to help clear secretions. Will transfer to ICU given increased oxygen requirement. I discussed the possible need for intubation and mechanical ventilation with the patient if hypoxia worsens, although I do not feel this is indicated at this time as he is not in respiratory distress.

## 2024-01-18 NOTE — Progress Notes (Signed)
 Patient assessed by respiratory. Patient stating between 83-88%. Patient placed on high flow O2. MD Dasie arrived at bedside to reassess. Patient transferred to 4N ICU via recliner. Fredie LITTIE Morones, RN

## 2024-01-18 NOTE — Progress Notes (Addendum)
      [address] [place] [employer] [address]             [phone]      2 Days Post-Op Procedure(s) (LRB): ROBOT-ASSISTED THORACOSCOPY REPAIR OF ESOPHAGEAL LEAK USING MYRIAD MATRIX (N/A) Subjective: Patient reports he is uncomfortable and has some pain  Objective: Vital signs in last 24 hours: Temp:  [97.7 F (36.5 C)-98.6 F (37 C)] 97.7 F (36.5 C) (09/06 0758) Pulse Rate:  [85-163] 86 (09/06 0758) Cardiac Rhythm: Atrial fibrillation (09/05 2001) Resp:  [8-29] 18 (09/06 0758) BP: (104-164)/(66-102) 119/72 (09/06 0758) SpO2:  [83 %-97 %] 88 % (09/06 0758) FiO2 (%):  [0 %-28 %] 0 % (09/05 1921)  Hemodynamic parameters for last 24 hours:    Intake/Output from previous day: 09/05 0701 - 09/06 0700 In: 90 [NG/GT:90] Out: 1500 [Urine:1000; Emesis/NG output:250; Drains:90; Chest Tube:160] Intake/Output this shift: No intake/output data recorded.  General appearance: alert, cooperative, and no distress Neurologic: intact Heart: irregularly irregular rhythm Lungs: Coarse breath sounds throughout Abdomen: some distension, +BS, some tenderness throughout Extremities: SCDs in place Wound: Clean and dry dressings around pleural tube and JP bulb, JP with scant serosanguinous drainage and chest tube with serosanguinous drainage  Lab Results: Recent Labs    01/17/24 0506 01/18/24 0606  WBC 18.6* 13.8*  HGB 11.3* 10.8*  HCT 33.5* 32.3*  PLT 189 195   BMET:  Recent Labs    01/17/24 0506 01/18/24 0606  NA 137 135  K 4.4 3.8  CL 100 103  CO2 26 22  GLUCOSE 123* 116*  BUN 21 16  CREATININE 1.00 0.82  CALCIUM  9.1 8.8*    PT/INR: No results for input(s): LABPROT, INR in the last 72 hours. ABG    Component Value Date/Time   PHART 7.307 (L) 01/15/2024 1459   HCO3 27.6 01/16/2024 1708   TCO2 29 01/16/2024 1708   ACIDBASEDEF 2.0 01/15/2024 1459   O2SAT 52 01/16/2024 1708   CBG (last 3)  Recent Labs    01/17/24 2342 01/18/24 0328  01/18/24 0757  GLUCAP 126* 116* 136*    Assessment/Plan: S/P Procedure(s) (LRB): ROBOT-ASSISTED THORACOSCOPY REPAIR OF ESOPHAGEAL LEAK USING MYRIAD MATRIX (N/A)  CV: Stable vital signs this AM, afib with controlled rate this AM. Developed afib with RVR yesterday, started on Diltiazem  drip, cardiology following.   Pulm: Saturating high 80s/low 90s on 6L [place]. Chest tube to water  seal. Chest tube output 160cc/24hrs, serosanguinous drainage. No air leak. R chest JP bulb 60cc/24hrs, serosanguinous drainage. R chest JP drain to gravity NOT to be placed to suction. CXR with possible small basilar right pneumothorax, left basilar atelectasis and possible small left pleural effusion. Will discuss with surgeon whether patient can use IS and flutter valve. Encourage ambulation.   GI: NPO, clear to start J tube feeds from CT surgery perspective. NG tube output 250cc/24hrs. Tube feeds per primary team.   ID: Likely reactive leukocytosis, afebrile. Primary team following  Dispo: Pleural tube and JP drain with serosanguinous drainage, to be left in place until at least esophagram is performed. Esophagram timing per primary team.   Addendum: Per Dr. Shyrl okay for patient to use IS and flutter valve   LOS: 3 days    Con GORMAN Bend, PA-C 01/18/2024   Chart reviewed, patient examined, agree with above.  CXR looks stable. Drainage is amber, serous from chest tube and JP. Continue current plans.

## 2024-01-18 NOTE — Progress Notes (Addendum)
 Patient transferred from 4NP via recliner.  Patient is on 15 liter breather mask and 6 liter cannula currently.  Patient is alert and oriented at this time.   1530:  Morphine  pump history cleared.  High flow nasal cannula placed and patient is maintaining oxygen saturation.  Patient educated on use of incentive spirometer and flutter valve.  Dr.  Dasie assess patient at bedside.  No new orders at this time.

## 2024-01-19 ENCOUNTER — Inpatient Hospital Stay (HOSPITAL_COMMUNITY)

## 2024-01-19 LAB — BASIC METABOLIC PANEL WITH GFR
Anion gap: 14 (ref 5–15)
BUN: 23 mg/dL (ref 8–23)
CO2: 21 mmol/L — ABNORMAL LOW (ref 22–32)
Calcium: 9.1 mg/dL (ref 8.9–10.3)
Chloride: 103 mmol/L (ref 98–111)
Creatinine, Ser: 1.07 mg/dL (ref 0.61–1.24)
GFR, Estimated: >60 mL/min (ref >60)
Glucose, Bld: 143 mg/dL — ABNORMAL HIGH (ref 70–99)
Potassium: 3.8 mmol/L (ref 3.5–5.1)
Sodium: 138 mmol/L (ref 135–145)

## 2024-01-19 LAB — GLUCOSE, CAPILLARY
Glucose-Capillary: 120 mg/dL — ABNORMAL HIGH (ref 70–99)
Glucose-Capillary: 122 mg/dL — ABNORMAL HIGH (ref 70–99)
Glucose-Capillary: 123 mg/dL — ABNORMAL HIGH (ref 70–99)
Glucose-Capillary: 128 mg/dL — ABNORMAL HIGH (ref 70–99)
Glucose-Capillary: 138 mg/dL — ABNORMAL HIGH (ref 70–99)
Glucose-Capillary: 141 mg/dL — ABNORMAL HIGH (ref 70–99)
Glucose-Capillary: 153 mg/dL — ABNORMAL HIGH (ref 70–99)

## 2024-01-19 LAB — CBC
HCT: 33.1 % — ABNORMAL LOW (ref 39.0–52.0)
Hemoglobin: 11 g/dL — ABNORMAL LOW (ref 13.0–17.0)
MCH: 33.4 pg (ref 26.0–34.0)
MCHC: 33.2 g/dL (ref 30.0–36.0)
MCV: 100.6 fL — ABNORMAL HIGH (ref 80.0–100.0)
Platelets: 219 K/uL (ref 150–400)
RBC: 3.29 MIL/uL — ABNORMAL LOW (ref 4.22–5.81)
RDW: 12.7 % (ref 11.5–15.5)
WBC: 7.6 K/uL (ref 4.0–10.5)
nRBC: 0 % (ref 0.0–0.2)

## 2024-01-19 MED ORDER — VITAL HP 1.0 CAL PO LIQD
1000.0000 mL | ORAL | Status: DC
  Administered 2024-01-19 – 2024-01-20 (×2): 1000 mL

## 2024-01-19 NOTE — Progress Notes (Signed)
 3 Days Post-Op  Subjective: Oxygenation improving, sats 99% on 10L this morning. Undergoing chest PT, reports he has been able to cough up more secretions. Passing flatus. Remains in a-fib.   Objective: Vital signs in last 24 hours: Temp:  [97.6 F (36.4 C)-99.2 F (37.3 C)] 98.2 F (36.8 C) (09/07 0400) Pulse Rate:  [78-107] 90 (09/07 0700) Resp:  [13-25] 20 (09/07 0717) BP: (94-141)/(64-96) 124/73 (09/07 0700) SpO2:  [79 %-100 %] 98 % (09/07 0700) FiO2 (%):  [61 %-98 %] 96 % (09/07 0717) Last BM Date :  (PTA)  Intake/Output from previous day: 09/06 0701 - 09/07 0700 In: 4201.3 [I.V.:4081.3; NG/GT:90] Out: 970 [Urine:400; Emesis/NG output:150; Drains:145; Chest Tube:275] Intake/Output this shift: No intake/output data recorded.  PE: General: resting comfortably, NAD Neuro: alert and oriented, no focal deficits HEENT: NG in place with minimal dark fluid Resp: normal work of breathing on 10L [place], R chest tube to water  seal with serosanguinous drainage. R pleural JP with serosanguinous drainage. CV: irregular rhythm Abdomen: mildly distended but much softer compared to yesterday, minimally tender. Upper midline incision clean and dry. RUQ JP with serosanguinous drainage.    Lab Results:  Recent Labs    01/18/24 0606 01/19/24 0420  WBC 13.8* 7.6  HGB 10.8* 11.0*  HCT 32.3* 33.1*  PLT 195 219   BMET Recent Labs    01/18/24 0606 01/19/24 0420  NA 135 138  K 3.8 3.8  CL 103 103  CO2 22 21*  GLUCOSE 116* 143*  BUN 16 23  CREATININE 0.82 1.07  CALCIUM  8.8* 9.1   PT/INR No results for input(s): LABPROT, INR in the last 72 hours. CMP     Component Value Date/Time   NA 138 01/19/2024 0420   K 3.8 01/19/2024 0420   CL 103 01/19/2024 0420   CO2 21 (L) 01/19/2024 0420   GLUCOSE 143 (H) 01/19/2024 0420   BUN 23 01/19/2024 0420   CREATININE 1.07 01/19/2024 0420   CREATININE 0.85 10/10/2023 0824   CALCIUM  9.1 01/19/2024 0420   PROT 6.7 01/10/2024 1130    ALBUMIN  3.4 (L) 01/10/2024 1130   AST 17 01/10/2024 1130   AST 15 10/10/2023 0824   ALT 11 01/10/2024 1130   ALT 11 10/10/2023 0824   ALKPHOS 32 (L) 01/10/2024 1130   BILITOT 0.6 01/10/2024 1130   BILITOT 0.5 10/10/2023 0824   GFRNONAA >60 01/19/2024 0420   GFRNONAA >60 10/10/2023 0824   GFRAA  10/16/2007 1246    >60        The eGFR has been calculated using the MDRD equation. This calculation has not been validated in all clinical   Lipase  No results found for: LIPASE   Assessment/Plan  68 yo male with gastric cardia adenocarcinoma, POD4 s/p total gastrectomy, with distal esophagectomy and roux-en-Y esophagojejunostomy, POD3 s/p takeback by thoracic surgery for RATS repair of anastomotic leak. - Strict NPO, NG tube to remain in place to LIS - R chest tube on water  seal per thoracic surgery, pleural JP to gravity, no suction - Having bowel function. Begin trophic J tube feeds today, do not advance. - Oxygenation improving, wean O2 as tolerated. Continue IS, flutter valve, and chest PT. - Pain control: scheduled IV tylenol  and robaxin , continue morphine  PCA, scheduled valium  and toradol  - T2DM: on sliding scale novolog  - A-fib with RVR: rate controlled on dilt gtt and scheduled metoprolol , remains in a-fib. Appreciate cardiology recommendations. Will start anticoagulation if a-fib persists. - FEN: strict NPO,  NG tube to LIS, begin trophic J tube feeds - VTE: lovenox , SCDs - Dispo: ICU    LOS: 4 days    Leonor Dawn, MD [facility] Surgery General, Hepatobiliary and Pancreatic Surgery 01/19/24 7:43 AM

## 2024-01-19 NOTE — Progress Notes (Addendum)
 [address] [employer] [address]             [phone]      3 Days Post-Op Procedure(s) (LRB): ROBOT-ASSISTED THORACOSCOPY REPAIR OF ESOPHAGEAL LEAK USING MYRIAD MATRIX (N/A) Subjective: Patient sleeping on arrival, once woken up he reports he likes the chest PT and feels like it is helping  Objective: Vital signs in last 24 hours: Temp:  [97.6 F (36.4 C)-99.2 F (37.3 C)] 98 F (36.7 C) (09/07 0805) Pulse Rate:  [78-107] 89 (09/07 0800) Cardiac Rhythm: Atrial fibrillation (09/07 0800) Resp:  [13-25] 24 (09/07 0800) BP: (94-141)/(64-96) 112/83 (09/07 0800) SpO2:  [79 %-100 %] 100 % (09/07 0800) FiO2 (%):  [61 %-98 %] 96 % (09/07 0717)  Hemodynamic parameters for last 24 hours:    Intake/Output from previous day: 09/06 0701 - 09/07 0700 In: 4201.3 [I.V.:4081.3; NG/GT:90] Out: 970 [Urine:400; Emesis/NG output:150; Drains:145; Chest Tube:275] Intake/Output this shift: Total I/O In: 90 [I.V.:90] Out: -   General appearance: alert, cooperative, and no distress Neurologic: intact Heart: irregularly irregular rhythm Lungs: diminished bibasilar breath sounds Wound: Clean and dry dressings around chest drains, chest tube drainage with amber/serosanguinous output, JP drain with scant serosanguinous drainage in bulb and some minimal white phlegm looking drainage  Lab Results: Recent Labs    01/18/24 0606 01/19/24 0420  WBC 13.8* 7.6  HGB 10.8* 11.0*  HCT 32.3* 33.1*  PLT 195 219   BMET:  Recent Labs    01/18/24 0606 01/19/24 0420  NA 135 138  K 3.8 3.8  CL 103 103  CO2 22 21*  GLUCOSE 116* 143*  BUN 16 23  CREATININE 0.82 1.07  CALCIUM  8.8* 9.1    PT/INR: No results for input(s): LABPROT, INR in the last 72 hours. ABG    Component Value Date/Time   PHART 7.307 (L) 01/15/2024 1459   HCO3 27.6 01/16/2024 1708   TCO2 29 01/16/2024 1708   ACIDBASEDEF 2.0 01/15/2024 1459   O2SAT 52 01/16/2024 1708   CBG (last 3)  Recent  Labs    01/19/24 0005 01/19/24 0408 01/19/24 0738  GLUCAP 120* 138* 122*    Assessment/Plan: S/P Procedure(s) (LRB): ROBOT-ASSISTED THORACOSCOPY REPAIR OF ESOPHAGEAL LEAK USING MYRIAD MATRIX (N/A)  CV: Stable vital signs this AM, afib with controlled rate this AM. Cardiology following   Pulm: Saturating well on 9L HFNC this AM. Patient was transferred to the ICU yesterday due to hypoxia with increased oxygen requirements. Aggressive pulmonary toilet encouraged with IS, Flutter and chest PT helping. Chest tube to water  seal. Chest tube output 275cc/24hrs, amber/serosanguinous drainage. No air leak. R chest JP bulb 125cc/24hrs, serosanguinous drainage with some small white phlegm looking pieces. R chest JP drain to gravity NOT to be placed to suction. CXR without clear pneumothorax, left basilar atelectasis and small left pleural effusion. Encourage ambulation   GI: NG tube output 150cc/24hrs. General surgery starting J tube feeds today.   ID: Likely reactive leukocytosis resolved, Tmax 99.2. Primary team following   Dispo: Pleural tube and JP drain with thin amber/serosanguinous drainage, no further bilious drainage. Drains to be left in place. Esophagram timing per primary team.    LOS: 4 days    Con GORMAN Bend, PA-C 01/19/2024   Chart reviewed, patient examined, agree with above.  Transferred to ICU yesterday for hypoxemia. He looks ok today with sats 98% on 9 L HFNC. CXR unchanged and looks good. Pleural and JP output  still thin amber serosanguinous.

## 2024-01-20 DIAGNOSIS — I480 Paroxysmal atrial fibrillation: Secondary | ICD-10-CM | POA: Diagnosis not present

## 2024-01-20 LAB — CBC
HCT: 32 % — ABNORMAL LOW (ref 39.0–52.0)
Hemoglobin: 10.9 g/dL — ABNORMAL LOW (ref 13.0–17.0)
MCH: 33.6 pg (ref 26.0–34.0)
MCHC: 34.1 g/dL (ref 30.0–36.0)
MCV: 98.8 fL (ref 80.0–100.0)
Platelets: 247 K/uL (ref 150–400)
RBC: 3.24 MIL/uL — ABNORMAL LOW (ref 4.22–5.81)
RDW: 12.5 % (ref 11.5–15.5)
WBC: 4.9 K/uL (ref 4.0–10.5)
nRBC: 0 % (ref 0.0–0.2)

## 2024-01-20 LAB — BASIC METABOLIC PANEL WITH GFR
Anion gap: 8 (ref 5–15)
BUN: 18 mg/dL (ref 8–23)
CO2: 24 mmol/L (ref 22–32)
Calcium: 8.6 mg/dL — ABNORMAL LOW (ref 8.9–10.3)
Chloride: 106 mmol/L (ref 98–111)
Creatinine, Ser: 0.75 mg/dL (ref 0.61–1.24)
GFR, Estimated: >60 mL/min (ref >60)
Glucose, Bld: 149 mg/dL — ABNORMAL HIGH (ref 70–99)
Potassium: 3.1 mmol/L — ABNORMAL LOW (ref 3.5–5.1)
Sodium: 138 mmol/L (ref 135–145)

## 2024-01-20 LAB — GLUCOSE, CAPILLARY
Glucose-Capillary: 126 mg/dL — ABNORMAL HIGH (ref 70–99)
Glucose-Capillary: 131 mg/dL — ABNORMAL HIGH (ref 70–99)
Glucose-Capillary: 131 mg/dL — ABNORMAL HIGH (ref 70–99)
Glucose-Capillary: 139 mg/dL — ABNORMAL HIGH (ref 70–99)
Glucose-Capillary: 144 mg/dL — ABNORMAL HIGH (ref 70–99)
Glucose-Capillary: 162 mg/dL — ABNORMAL HIGH (ref 70–99)

## 2024-01-20 LAB — SURGICAL PATHOLOGY

## 2024-01-20 MED ORDER — VITAL 1.5 CAL PO LIQD
1000.0000 mL | ORAL | Status: DC
  Administered 2024-01-20: 1000 mL via JEJUNOSTOMY

## 2024-01-20 MED ORDER — METOPROLOL TARTRATE 25 MG/10 ML ORAL SUSPENSION
50.0000 mg | Freq: Two times a day (BID) | ORAL | Status: DC
  Administered 2024-01-20 – 2024-02-01 (×26): 50 mg via JEJUNOSTOMY
  Filled 2024-01-20 (×27): qty 20

## 2024-01-20 MED ORDER — ACETAMINOPHEN 160 MG/5ML PO SOLN
650.0000 mg | Freq: Four times a day (QID) | ORAL | Status: DC
  Administered 2024-01-20 – 2024-01-24 (×17): 650 mg via JEJUNOSTOMY
  Filled 2024-01-20 (×18): qty 20.3

## 2024-01-20 MED ORDER — POTASSIUM CHLORIDE 10 MEQ/100ML IV SOLN
10.0000 meq | INTRAVENOUS | Status: AC
  Administered 2024-01-20 (×4): 10 meq via INTRAVENOUS
  Filled 2024-01-20 (×4): qty 100

## 2024-01-20 MED ORDER — ENOXAPARIN SODIUM 80 MG/0.8ML IJ SOSY
80.0000 mg | PREFILLED_SYRINGE | Freq: Two times a day (BID) | INTRAMUSCULAR | Status: DC
  Administered 2024-01-20 – 2024-01-21 (×4): 80 mg via SUBCUTANEOUS
  Filled 2024-01-20 (×6): qty 0.8

## 2024-01-20 MED ORDER — OXYCODONE HCL 5 MG/5ML PO SOLN
5.0000 mg | ORAL | Status: DC | PRN
  Administered 2024-01-20 – 2024-01-21 (×4): 10 mg via JEJUNOSTOMY
  Filled 2024-01-20 (×4): qty 10

## 2024-01-20 MED ORDER — MORPHINE SULFATE (PF) 2 MG/ML IV SOLN
2.0000 mg | INTRAVENOUS | Status: DC | PRN
  Administered 2024-01-21: 4 mg via INTRAVENOUS
  Filled 2024-01-20: qty 2

## 2024-01-20 MED ORDER — KETOROLAC TROMETHAMINE 15 MG/ML IJ SOLN
15.0000 mg | Freq: Three times a day (TID) | INTRAMUSCULAR | Status: AC
  Administered 2024-01-20 (×2): 15 mg via INTRAVENOUS
  Filled 2024-01-20 (×2): qty 1

## 2024-01-20 MED ORDER — METOPROLOL TARTRATE 5 MG/5ML IV SOLN
5.0000 mg | Freq: Once | INTRAVENOUS | Status: AC
  Administered 2024-01-20: 5 mg via INTRAVENOUS
  Filled 2024-01-20: qty 5

## 2024-01-20 MED ORDER — METOPROLOL TARTRATE 5 MG/5ML IV SOLN: 5.0000 mg | Freq: Four times a day (QID) | INTRAVENOUS | Status: DC | PRN

## 2024-01-20 NOTE — Progress Notes (Signed)
 4 Days Post-Op  Subjective: Weaning oxygen, down to 4L this morning with sats in 90s. Pain control improving. Still passing flatus, no BM, denies increased pain or bloating with trophic feeds.  Objective: Vital signs in last 24 hours: Temp:  [97.7 F (36.5 C)-99.2 F (37.3 C)] 98.4 F (36.9 C) (09/08 0700) Pulse Rate:  [82-130] 91 (09/08 0830) Resp:  [15-28] 19 (09/08 0830) BP: (103-168)/(62-87) 138/76 (09/08 0800) SpO2:  [88 %-100 %] 94 % (09/08 0830) FiO2 (%):  [40 %-100 %] 40 % (09/08 0740) Weight:  [77.1 kg] 77.1 kg (09/08 0500) Last BM Date :  (PTA)  Intake/Output from previous day: 09/07 0701 - 09/08 0700 In: 2462.8 [I.V.:1934.5; NG/GT:528.3] Out: 1660 [Urine:1300; Drains:110; Chest Tube:250] Intake/Output this shift: Total I/O In: 89.5 [I.V.:69.5; NG/GT:20] Out: 370 [Urine:350; Chest Tube:20]  PE: General: resting comfortably, NAD Neuro: alert and oriented, no focal deficits HEENT: NG in place with minimal dark fluid Resp: normal work of breathing on 4L [place], R chest tube to water  seal with serosanguinous drainage. R pleural JP with serosanguinous drainage. CV: irregular rhythm Abdomen: mildly distended but very soft, minimally tender. Upper midline incision clean and dry. RUQ JP with serosanguinous drainage. J tube with feeds running at 20 ml/hr.    Lab Results:  Recent Labs    01/19/24 0420 01/20/24 0524  WBC 7.6 4.9  HGB 11.0* 10.9*  HCT 33.1* 32.0*  PLT 219 247   BMET Recent Labs    01/19/24 0420 01/20/24 0524  NA 138 138  K 3.8 3.1*  CL 103 106  CO2 21* 24  GLUCOSE 143* 149*  BUN 23 18  CREATININE 1.07 0.75  CALCIUM  9.1 8.6*   PT/INR No results for input(s): LABPROT, INR in the last 72 hours. CMP     Component Value Date/Time   NA 138 01/20/2024 0524   K 3.1 (L) 01/20/2024 0524   CL 106 01/20/2024 0524   CO2 24 01/20/2024 0524   GLUCOSE 149 (H) 01/20/2024 0524   BUN 18 01/20/2024 0524   CREATININE 0.75 01/20/2024 0524    CREATININE 0.85 10/10/2023 0824   CALCIUM  8.6 (L) 01/20/2024 0524   PROT 6.7 01/10/2024 1130   ALBUMIN  3.4 (L) 01/10/2024 1130   AST 17 01/10/2024 1130   AST 15 10/10/2023 0824   ALT 11 01/10/2024 1130   ALT 11 10/10/2023 0824   ALKPHOS 32 (L) 01/10/2024 1130   BILITOT 0.6 01/10/2024 1130   BILITOT 0.5 10/10/2023 0824   GFRNONAA >60 01/20/2024 0524   GFRNONAA >60 10/10/2023 0824   GFRAA  10/16/2007 1246    >60        The eGFR has been calculated using the MDRD equation. This calculation has not been validated in all clinical   Lipase  No results found for: LIPASE   Assessment/Plan  68 yo male with gastric cardia adenocarcinoma, POD5 s/p total gastrectomy, with distal esophagectomy and roux-en-Y esophagojejunostomy, POD4 s/p takeback by thoracic surgery for RATS repair of anastomotic leak. - Strict NPO, NG tube to remain in place to LIS - R chest tube on water  seal per thoracic surgery, pleural JP to gravity, no suction - Tolerating trophic J tube feeds, advance to 81ml/hr - Continue weaning oxygen as tolerated. Continue IS, flutter valve, and chest PT. - Pain control: scheduled IV tylenol  and robaxin . Begin prn liquid oxycodone  via J tube and transition off PCA today. Scheduled valium  and toradol . - T2DM: on sliding scale novolog  - A-fib with RVR: rate  controlled on dilt gtt and scheduled metoprolol , remains in a-fib. Appreciate cardiology recommendations. Will start anticoagulation today. Hgb has been stable with no clinical signs of postop bleeding. - FEN: strict NPO, NG tube to LIS, J tube feeds to 30, SLIV.  - VTE: start therapeutic lovenox  for persistent a-fib - Dispo: transfer to progressive care    LOS: 5 days    Jon Dawn, MD [facility] Surgery General, Hepatobiliary and Pancreatic Surgery 01/20/24 8:34 AM

## 2024-01-20 NOTE — Progress Notes (Addendum)
 Dr Sebastian paged d/t hypertension. Systolic blood pressure 168 at 0218 following 0200 systolic blood pressure of 156. Dr. Sebastian called me back and I informed him of the situation and conflicting systolic blood pressure goal orders (<140 vs <180). He wants to hold on treating his hypertension for now, in hopes of it coming down. Voiced the possibility of increasing the scheduled lopressor , but instructed me to give hydralazine  if his systolic went above 170, per the medication administration instructions.   Addendum - 0310 HR above goal of 65-105 maxed on dilt gtt. Paged [facility] for Lopressor  5mg  once. VO received and order placed per Dr Sebastian.

## 2024-01-20 NOTE — Progress Notes (Signed)
 PT Cancellation Note  Patient Details Name: [name] MRN: [id number] DOB: 02/12/1956   Cancelled Treatment:    Reason Eval/Treat Not Completed: Patient at procedure or test/unavailable  Patient undergoing chest PT. Will plan to see 9/9.   [name], PT Acute Rehabilitation Services  Office [phone]   [name] 01/20/2024, 3:42 PM

## 2024-01-20 NOTE — Progress Notes (Signed)
 PHARMACY - ANTICOAGULATION CONSULT NOTE  Pharmacy Consult for enoxaparin  (therapeutic) Indication: atrial fibrillation  Allergies  Allergen Reactions   Dilaudid  [Hydromorphone ] Itching   Lisinopril Nausea Only    Patient Measurements: Height: 5' 11 (180.3 cm) Weight: 77.1 kg (169 lb 15.6 oz) IBW/kg (Calculated) : 75.3 HEPARIN  DW (KG): 80.7  Vital Signs: Temp: 98.4 F (36.9 C) (09/08 0700) Temp Source: Oral (09/08 0700) BP: 138/76 (09/08 0800) Pulse Rate: 91 (09/08 0830)  Labs: Recent Labs    01/18/24 0606 01/19/24 0420 01/20/24 0524  HGB 10.8* 11.0* 10.9*  HCT 32.3* 33.1* 32.0*  PLT 195 219 247  CREATININE 0.82 1.07 0.75    Estimated Creatinine Clearance: 95.4 mL/min (by C-G formula based on SCr of 0.75 mg/dL).  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 Pharmacy consulted to dose enoxaparin  for therapeutic anticoagulation.  Hgb 10.9, Plt 247   Goal of Therapy:  Anti-Xa level 0.6-1 units/ml 4hrs after LMWH dose given Monitor platelets by anticoagulation protocol: Yes   Plan:  Enoxaparin  1mg /kg q12h (80mg  subcutaneous q12h) F/u levels PRN per protocol Daily CBC F/u s/sx bleeding and weight changes  Sharyne Glatter, PharmD, BCCCP Critical Care Clinical Pharmacist 01/20/2024 8:46 AM

## 2024-01-20 NOTE — Progress Notes (Signed)
   Progress Note  Patient Name: [name] Date of Encounter: 01/20/2024  Primary Cardiologist: Redell Leiter, MD  Interval Summary  Pt with a history of paroxysmal atrial fibrillation, coronary artery calcification, hypertension and prediabetes admitted for surgical evaluation of GE junction gastric cancer, s/p  total gastrectomy with distal esophagectomy and Roux-en-Y esophagojejunostomy, now with postoperative atrial fibrillation.   Patient remains in atrial fibrillation with adequate heart rate control with IV diltiazem  at 10 mg/hr but Lopressor  has been changed to 50 mg bid per J tube.   Pt is sleeping soundly and does not rouse w/ light touch. Morphine  PCA recently d/c'd.  Vital Signs  Vitals:   01/20/24 0830 01/20/24 0900 01/20/24 0930 01/20/24 1000  BP:  138/69  115/68  Pulse: 91 90  71  Resp: 19 (!) 21 (!) 22 (!) 23  Temp:      TempSrc:      SpO2: 94% 94% 93% 94%  Weight:      Height:        Intake/Output Summary (Last 24 hours) at 01/20/2024 1035 Last data filed at 01/20/2024 1000 Gross per 24 hour  Intake 2370.41 ml  Output 2040 ml  Net 330.41 ml   Filed Weights   01/15/24 0642 01/16/24 1341 01/20/24 0500  Weight: 80.7 kg 80.7 kg 77.1 kg    Physical Exam  GEN: No acute distress. sleeping Neck: No JVD. Cardiac: Irregularly irregular without significant murmur or gallop.  Respiratory: Nonlabored. Clear to auscultation anteriorly MS: No LE edema. Has edema in hands and some in arms  ECG/Telemetry  Afib w/ rate improved last 24 hr  Labs  Chemistry Recent Labs  Lab 01/18/24 0606 01/19/24 0420 01/20/24 0524  NA 135 138 138  K 3.8 3.8 3.1*  CL 103 103 106  CO2 22 21* 24  GLUCOSE 116* 143* 149*  BUN 16 23 18   CREATININE 0.82 1.07 0.75  CALCIUM  8.8* 9.1 8.6*  GFRNONAA >60 >60 >60  ANIONGAP 10 14 8     Hematology Recent Labs  Lab 01/18/24 0606 01/19/24 0420 01/20/24 0524  WBC 13.8* 7.6 4.9  RBC 3.25* 3.29* 3.24*  HGB 10.8* 11.0* 10.9*  HCT  32.3* 33.1* 32.0*  MCV 99.4 100.6* 98.8  MCH 33.2 33.4 33.6  MCHC 33.4 33.2 34.1  RDW 12.6 12.7 12.5  PLT 195 219 247    Cardiac Studies  No interval cardiac testing for review today.  Assessment & Plan  1.  Atrial fibrillation, postoperative and persistent at this point.  CHA2DS2-VASc score is 3.  Currently on IV diltiazem  and Lopressor  per tube with reasonable heart rate control.  No oral anticoagulation, on lovenox  80 mg bid.  2.  Postoperative day 3 status post total gastrectomy with distal esophagectomy and Roux-en-Y esophagojejunostomy, postoperative day 2 status post takeback by thoracic surgery for RATS repair of anastomotic leak.  Remains n.p.o. at this time w/ some meds per J tube and tube feeds   For questions or updates, please contact  [facility] Please consult [email or web address] for contact info under   Signed, Shona Shad, PA-C  01/20/2024, 10:35 AM

## 2024-01-20 NOTE — Plan of Care (Signed)
  Problem: Clinical Measurements: Goal: Will remain free from infection Outcome: Progressing Goal: Respiratory complications will improve Outcome: Progressing   Problem: Activity: Goal: Risk for activity intolerance will decrease Outcome: Progressing   Problem: Pain Managment: Goal: General experience of comfort will improve and/or be controlled Outcome: Progressing   Problem: Safety: Goal: Ability to remain free from injury will improve Outcome: Progressing

## 2024-01-20 NOTE — Progress Notes (Addendum)
      [address] [place] [employer] [address]             [phone]    5 Days Post-Op Esophagectomy   4 Days Post-Op ROBOT-ASSISTED THORACOSCOPY REPAIR OF ESOPHAGEAL LEAK USING MYRIAD MATRIX (N/A) Subjective: Awake and alert, says pain control is adequate with the PCA and his breathing is much better.  Passing gas, no BM yet. Denies nausea.   Cardizem  at 15mg /hr TF at 50ml/hr  Objective: Vital signs in last 24 hours: Temp:  [97.7 F (36.5 C)-99.2 F (37.3 C)] 98.4 F (36.9 C) (09/08 0700) Pulse Rate:  [82-130] 94 (09/08 0700) Cardiac Rhythm: Atrial fibrillation (09/08 0400) Resp:  [15-28] 23 (09/08 0700) BP: (103-168)/(62-87) 123/74 (09/08 0700) SpO2:  [88 %-100 %] 95 % (09/08 0700) FiO2 (%):  [40 %-100 %] 40 % (09/08 0400) Weight:  [77.1 kg] 77.1 kg (09/08 0500)     Intake/Output from previous day: 09/07 0701 - 09/08 0700 In: 2462.8 [I.V.:1934.5; NG/GT:528.3] Out: 1660 [Urine:1300; Drains:110; Chest Tube:250] Intake/Output this shift: No intake/output data recorded.  General appearance: alert, cooperative, and no distress Neurologic: intact Heart: irregularly irregular rhythm, monitor showing rate-controlled AF in the 90's currently Lungs: normal respiratory effort with good air movement bilaterally, O2 at 5L /min with adequate O2 sats.  ABD: soft, active bowel sounds. Wound: Clean and dry dressings around chest drains, chest tube drainage with serosanguinous output past 24 hours, chest JP drain had 40ml serosanguinous drainage past 24 hours, currently empty.  EXTS: well perfused, SCD's in place.   Lab Results: Recent Labs    01/19/24 0420 01/20/24 0524  WBC 7.6 4.9  HGB 11.0* 10.9*  HCT 33.1* 32.0*  PLT 219 247   BMET:  Recent Labs    01/19/24 0420 01/20/24 0524  NA 138 138  K 3.8 3.1*  CL 103 106  CO2 21* 24  GLUCOSE 143* 149*  BUN 23 18  CREATININE 1.07 0.75  CALCIUM  9.1 8.6*    PT/INR: No results for input(s):  LABPROT, INR in the last 72 hours. ABG    Component Value Date/Time   PHART 7.307 (L) 01/15/2024 1459   HCO3 27.6 01/16/2024 1708   TCO2 29 01/16/2024 1708   ACIDBASEDEF 2.0 01/15/2024 1459   O2SAT 52 01/16/2024 1708   CBG (last 3)  Recent Labs    01/19/24 2307 01/20/24 0310 01/20/24 0727  GLUCAP 123* 131* 144*    Assessment/Plan: S/P Procedure(s) (LRB): ROBOT-ASSISTED THORACOSCOPY REPAIR OF ESOPHAGEAL LEAK USING MYRIAD MATRIX (N/A)  CV: Stable vital signs this AM, afib with controlled rate this AM. Cardiology following.   Pulm: Improved oxygenation, O2 now at 5-6L/[place] with stable sats and normal resp effort.  Aggressive pulmonary toilet encouraged with IS, Flutter and chest PT helping. Leaving the chest tube to water  seal and the chest JP to non-compressed bulb. Encourage ambulation   GI: tolerating J tube feeds at 41ml/hr. Primary team advancing the rate today.   ID: leukocytosis resolved, no fever.   Dispo: Progressing, tolerating enteral feeding.  Esophagram timing per primary team. CTS will continue to follow.    LOS: 5 days    Myron G. Roddenberry, PA-C 01/20/2024  Agree Ok for swallow tomorrow from thoracic standpoint  Linnie MALVA Rayas

## 2024-01-21 ENCOUNTER — Inpatient Hospital Stay (HOSPITAL_COMMUNITY)

## 2024-01-21 DIAGNOSIS — I480 Paroxysmal atrial fibrillation: Secondary | ICD-10-CM | POA: Diagnosis not present

## 2024-01-21 LAB — BPAM RBC
Blood Product Expiration Date: 202509252359
Blood Product Expiration Date: 202509252359
Blood Product Expiration Date: 202509282359
Blood Product Expiration Date: 202509282359
ISSUE DATE / TIME: 202508302047
ISSUE DATE / TIME: 202508310015
Unit Type and Rh: 6200
Unit Type and Rh: 6200
Unit Type and Rh: 6200
Unit Type and Rh: 6200

## 2024-01-21 LAB — GLUCOSE, CAPILLARY
Glucose-Capillary: 138 mg/dL — ABNORMAL HIGH (ref 70–99)
Glucose-Capillary: 151 mg/dL — ABNORMAL HIGH (ref 70–99)
Glucose-Capillary: 163 mg/dL — ABNORMAL HIGH (ref 70–99)
Glucose-Capillary: 154 mg/dL — ABNORMAL HIGH (ref 70–99)
Glucose-Capillary: 159 mg/dL — ABNORMAL HIGH (ref 70–99)
Glucose-Capillary: 158 mg/dL — ABNORMAL HIGH (ref 70–99)

## 2024-01-21 LAB — CBC
HCT: 37.3 % — ABNORMAL LOW (ref 39.0–52.0)
Hemoglobin: 12.5 g/dL — ABNORMAL LOW (ref 13.0–17.0)
MCH: 33.3 pg (ref 26.0–34.0)
MCHC: 33.5 g/dL (ref 30.0–36.0)
MCV: 99.5 fL (ref 80.0–100.0)
Platelets: 283 K/uL (ref 150–400)
RBC: 3.75 MIL/uL — ABNORMAL LOW (ref 4.22–5.81)
RDW: 12.7 % (ref 11.5–15.5)
WBC: 11.7 K/uL — ABNORMAL HIGH (ref 4.0–10.5)
nRBC: 0 % (ref 0.0–0.2)

## 2024-01-21 LAB — TYPE AND SCREEN
ABO/RH(D): A POS
Antibody Screen: NEGATIVE
Unit division: 0
Unit division: 0
Unit division: 0
Unit division: 0

## 2024-01-21 LAB — BASIC METABOLIC PANEL WITH GFR
Anion gap: 13 (ref 5–15)
BUN: 17 mg/dL (ref 8–23)
CO2: 22 mmol/L (ref 22–32)
Calcium: 9.2 mg/dL (ref 8.9–10.3)
Chloride: 105 mmol/L (ref 98–111)
Creatinine, Ser: 0.83 mg/dL (ref 0.61–1.24)
GFR, Estimated: >60 mL/min (ref >60)
Glucose, Bld: 149 mg/dL — ABNORMAL HIGH (ref 70–99)
Potassium: 3.5 mmol/L (ref 3.5–5.1)
Sodium: 140 mmol/L (ref 135–145)

## 2024-01-21 LAB — MAGNESIUM: Magnesium: 1.7 mg/dL (ref 1.7–2.4)

## 2024-01-21 MED ORDER — PROSOURCE TF20 ENFIT COMPATIBL EN LIQD
60.0000 mL | Freq: Two times a day (BID) | ENTERAL | Status: DC
  Administered 2024-01-21 – 2024-02-06 (×31): 60 mL
  Filled 2024-01-21 (×31): qty 60

## 2024-01-21 MED ORDER — VITAL 1.5 CAL PO LIQD
1000.0000 mL | ORAL | Status: DC
  Administered 2024-01-21 – 2024-01-30 (×8): 1000 mL via JEJUNOSTOMY
  Filled 2024-01-21: qty 1000

## 2024-01-21 MED ORDER — THIAMINE MONONITRATE 100 MG PO TABS: 100.0000 mg | ORAL_TABLET | Freq: Every day | ORAL | Status: DC

## 2024-01-21 MED ORDER — ADULT MULTIVITAMIN W/MINERALS CH: 1.0000 | ORAL_TABLET | Freq: Every day | ORAL | Status: DC

## 2024-01-21 MED ORDER — HALOPERIDOL LACTATE 5 MG/ML IJ SOLN
5.0000 mg | Freq: Four times a day (QID) | INTRAMUSCULAR | Status: DC | PRN
  Administered 2024-01-22 – 2024-01-28 (×2): 5 mg via INTRAVENOUS
  Filled 2024-01-21 (×2): qty 1

## 2024-01-21 MED ORDER — POTASSIUM CHLORIDE 10 MEQ/100ML IV SOLN
10.0000 meq | INTRAVENOUS | Status: AC
  Administered 2024-01-21 (×4): 10 meq via INTRAVENOUS
  Filled 2024-01-21 (×4): qty 100

## 2024-01-21 MED ORDER — FLUCONAZOLE IN SODIUM CHLORIDE 400-0.9 MG/200ML-% IV SOLN
400.0000 mg | INTRAVENOUS | Status: AC
  Administered 2024-01-22 – 2024-01-30 (×9): 400 mg via INTRAVENOUS
  Filled 2024-01-21 (×9): qty 200

## 2024-01-21 MED ORDER — FLUCONAZOLE IN SODIUM CHLORIDE 400-0.9 MG/200ML-% IV SOLN
800.0000 mg | Freq: Once | INTRAVENOUS | Status: AC
  Administered 2024-01-21: 800 mg via INTRAVENOUS
  Filled 2024-01-21: qty 400

## 2024-01-21 MED ORDER — THIAMINE HCL 100 MG/ML IJ SOLN
100.0000 mg | Freq: Every day | INTRAMUSCULAR | Status: AC
  Administered 2024-01-21 – 2024-01-25 (×5): 100 mg via INTRAVENOUS
  Filled 2024-01-21 (×5): qty 2

## 2024-01-21 MED ORDER — ADULT MULTIVITAMIN LIQUID CH
15.0000 mL | Freq: Every day | ORAL | Status: DC
  Administered 2024-01-21 – 2024-02-01 (×12): 15 mL
  Filled 2024-01-21 (×12): qty 15

## 2024-01-21 MED ORDER — MORPHINE SULFATE (PF) 2 MG/ML IV SOLN
2.0000 mg | INTRAVENOUS | Status: DC | PRN
  Administered 2024-01-22: 4 mg via INTRAVENOUS
  Filled 2024-01-21: qty 2

## 2024-01-21 MED ORDER — FREE WATER
100.0000 mL | Freq: Four times a day (QID) | Status: DC
  Administered 2024-01-21 – 2024-01-26 (×17): 100 mL

## 2024-01-21 MED ORDER — PIPERACILLIN-TAZOBACTAM 3.375 G IVPB
3.3750 g | Freq: Three times a day (TID) | INTRAVENOUS | Status: AC
  Administered 2024-01-21 – 2024-01-30 (×29): 3.375 g via INTRAVENOUS
  Filled 2024-01-21 (×30): qty 50

## 2024-01-21 NOTE — Progress Notes (Addendum)
 [address] [place] [employer] [address]             [phone]    6 Days Post-Op Esophagectomy   5 Days Post-Op ROBOT-ASSISTED THORACOSCOPY REPAIR OF ESOPHAGEAL LEAK USING MYRIAD MATRIX (N/A) Subjective: Transferred out of the ICU to the Progressive unit yesterday.  Awake but confused and has been hallucinating per RN.  Has mitten restraints in place.   BM x 3 yesterday.  TF at 17ml/hr  Objective: Vital signs in last 24 hours: Temp:  [97.5 F (36.4 C)-98.5 F (36.9 C)] 98.1 F (36.7 C) (09/09 0345) Pulse Rate:  [71-116] 109 (09/09 0706) Cardiac Rhythm: Atrial fibrillation (09/08 2100) Resp:  [14-26] 26 (09/09 0706) BP: (94-163)/(67-96) 161/96 (09/09 0543) SpO2:  [87 %-99 %] 98 % (09/09 0706) FiO2 (%):  [40 %] 40 % (09/08 0930) Weight:  [77.7 kg] 77.7 kg (09/09 0500)     Intake/Output from previous day: 09/08 0701 - 09/09 0700 In: 835.9 [I.V.:151; NG/GT:251.5; IV Piggyback:433.4] Out: 1010 [Urine:650; Emesis/NG output:150; Drains:90; Chest Tube:120] Intake/Output this shift: No intake/output data recorded.  General appearance: alert, confused.   Neurologic: responsive, moves extremities Heart: irregularly irregular rhythm, monitor showing a-fib with VR 120-140's. Lungs: normal respiratory effort with good air movement bilaterally, has upper airway congestion with coarse cough.  O2 at 3L /min with adequate O2 sats.  ABD: soft, active bowel sounds. Wound: Clean and dry dressings around chest drains, chest tube drainage output past 24 hours and has transitioned from thin serosanguinous to thicker, turbid yellow drainage, chest JP drain had 35ml  drainage recorded for past 24 hours, currently empty.  EXTS: well perfused  Lab Results: Recent Labs    01/20/24 0524 01/21/24 0623  WBC 4.9 11.7*  HGB 10.9* 12.5*  HCT 32.0* 37.3*  PLT 247 283   BMET:  Recent Labs    01/20/24 0524 01/21/24 0623  NA 138 140  K 3.1* 3.5  CL 106 105  CO2  24 22  GLUCOSE 149* 149*  BUN 18 17  CREATININE 0.75 0.83  CALCIUM  8.6* 9.2    PT/INR: No results for input(s): LABPROT, INR in the last 72 hours. ABG    Component Value Date/Time   PHART 7.307 (L) 01/15/2024 1459   HCO3 27.6 01/16/2024 1708   TCO2 29 01/16/2024 1708   ACIDBASEDEF 2.0 01/15/2024 1459   O2SAT 52 01/16/2024 1708   CBG (last 3)  Recent Labs    01/20/24 2028 01/20/24 2335 01/21/24 0347  GLUCAP 139* 162* 138*    Assessment/Plan: S/P Procedure(s) (LRB): ROBOT-ASSISTED THORACOSCOPY REPAIR OF ESOPHAGEAL LEAK USING MYRIAD MATRIX (N/A)  CV: BP 150-160's this AM, afib persists with rate-control not as good as yesterday. Metoprolol  dose due at 10am per J-tube. On Lovenox . Cardiology following.   Pulm: Improved oxygenation, O2 now at 3L/[place] with stable sats and normal resp effort.  Continue aggressive pulmonary hygiene.  Leaving the chest tube to water  seal and the chest JP to non-compressed bulb. Encourage ambulation as able.   GI: J tube feeds at 58ml/hr.    ID: WBC 4.9-->11.7 in past 24 hours. No fever, wounds OK.    Dispo:  OK to proceed with esophagram today from CT surgery standpoint. We will continue to follow.    LOS: 6 days    Laurel JUDITHANN Becket, PA-C 01/21/2024   Output thicker today WBC up. Will be difficult to assess for leak without swallow, but will not  be able to tolerate given his mental status Would treat emperically with abx and antifungals  Caelum Federici O Deven Audi

## 2024-01-21 NOTE — Progress Notes (Signed)
 5 Days Post-Op  Subjective: Disoriented this morning. Per RN he was intermittently very agitated overnight. Having bowel function, tolerating tube feeds. Afebrile.  Objective: Vital signs in last 24 hours: Temp:  [97.5 F (36.4 C)-98.5 F (36.9 C)] 98.1 F (36.7 C) (09/09 0345) Pulse Rate:  [71-116] 103 (09/09 0911) Resp:  [14-26] 26 (09/09 0706) BP: (94-176)/(67-96) 176/83 (09/09 0911) SpO2:  [87 %-99 %] 98 % (09/09 0706) FiO2 (%):  [40 %] 40 % (09/08 0930) Weight:  [77.7 kg] 77.7 kg (09/09 0500) Last BM Date : 01/20/24  Intake/Output from previous day: 09/08 0701 - 09/09 0700 In: 835.9 [I.V.:151; NG/GT:251.5; IV Piggyback:433.4] Out: 1010 [Urine:650; Emesis/NG output:150; Drains:90; Chest Tube:120] Intake/Output this shift: Total I/O In: 20 [I.V.:20] Out: -   PE: General: resting comfortably, NAD Neuro: alert, disoriented to place HEENT: NG in place with minimal dark fluid Resp: normal work of breathing on 3L [place], R chest tube to water  seal with turbid serous drainage. R pleural JP with similar turbid yellow fluid, nonbilious. CV: irregular rhythm, tachycardic 100s Abdomen: very soft, nondistended and nontender. Upper midline incision clean and dry. RUQ JP with serosanguinous drainage. J tube with feeds running at 30 ml/hr.    Lab Results:  Recent Labs    01/20/24 0524 01/21/24 0623  WBC 4.9 11.7*  HGB 10.9* 12.5*  HCT 32.0* 37.3*  PLT 247 283   BMET Recent Labs    01/20/24 0524 01/21/24 0623  NA 138 140  K 3.1* 3.5  CL 106 105  CO2 24 22  GLUCOSE 149* 149*  BUN 18 17  CREATININE 0.75 0.83  CALCIUM  8.6* 9.2   PT/INR No results for input(s): LABPROT, INR in the last 72 hours. CMP     Component Value Date/Time   NA 140 01/21/2024 0623   K 3.5 01/21/2024 0623   CL 105 01/21/2024 0623   CO2 22 01/21/2024 0623   GLUCOSE 149 (H) 01/21/2024 0623   BUN 17 01/21/2024 0623   CREATININE 0.83 01/21/2024 0623   CREATININE 0.85 10/10/2023 0824    CALCIUM  9.2 01/21/2024 0623   PROT 6.7 01/10/2024 1130   ALBUMIN  3.4 (L) 01/10/2024 1130   AST 17 01/10/2024 1130   AST 15 10/10/2023 0824   ALT 11 01/10/2024 1130   ALT 11 10/10/2023 0824   ALKPHOS 32 (L) 01/10/2024 1130   BILITOT 0.6 01/10/2024 1130   BILITOT 0.5 10/10/2023 0824   GFRNONAA >60 01/21/2024 0623   GFRNONAA >60 10/10/2023 0824   GFRAA  10/16/2007 1246    >60        The eGFR has been calculated using the MDRD equation. This calculation has not been validated in all clinical   Lipase  No results found for: LIPASE   Assessment/Plan  68 yo male with gastric cardia adenocarcinoma, POD6 s/p total gastrectomy, with distal esophagectomy and roux-en-Y esophagojejunostomy, POD5 s/p takeback by thoracic surgery for RATS repair of anastomotic leak. - Concern for anastomotic leak given change in appearance of thoracic drain output. Patient's mental status does not allow for esophagram today. Keep strict NPO with NG to LIS, and will start empiric Zosyn  and fluconazole . - R chest tube on water  seal per thoracic surgery, pleural JP to gravity, no suction - Having bowel function, abdomen soft - continue J tube feed advancement to goal - Continue weaning oxygen as tolerated. Continue IS, flutter valve, and chest PT. Oxygenation has significantly improved. - Pain control: scheduled IV tylenol  and robaxin . PRN oxycodone  and  morphine  - minimize narcotics if possible given delirium. - T2DM: on sliding scale novolog  - A-fib with RVR: on dilt gtt and scheduled metoprolol , appreciate cardiology recommendations. Tachycardia and hypertension likely related to agitation.  - Delirium: delirium precautions, sleep hygiene. Will also consider EtOH withdrawal however patient has been admitted for 7 days so this is less likely given timing. - FEN: strict NPO, NG tube to LIS, advance J tube feeds to goal. - VTE: therapeutic lovenox  - Dispo: progressive care    LOS: 6 days    Leonor Dawn, MD  [facility] General, Hepatobiliary and Pancreatic Surgery 01/21/24 9:20 AM

## 2024-01-21 NOTE — Progress Notes (Signed)
 Nutrition Follow-up  DOCUMENTATION CODES:   Severe malnutrition in context of chronic illness  INTERVENTION:  Continue tube feeding via J tube per MD discretion: Vital 1.5 at 40 ml/hr and increase by 10 ml every 6 hours (1440 ml per day) Prosource TF20 60 ml BID 100 ml FWF Q6H   Provides 2320 kcal, 137 gm protein, 1500 ml free water  daily   Monitor magnesium , potassium, and phosphorus daily for at least 3 days, MD to replete as needed, as pt is at risk for refeeding syndrome 100 mg Thiamine  x 7 days  MVI with minerals daily  Monitor for diet advancement  If pt does not tolerate enteral feeding recommend TPN    *Will  will switch to a polymeric formula for home use before discharge.  NUTRITION DIAGNOSIS:   Severe Malnutrition related to cancer and cancer related treatments as evidenced by severe muscle depletion, moderate fat depletion, energy intake < or equal to 75% for > or equal to 1 month, percent weight loss (has lost 28 lbs, 15% in 6 months). - Ongoing   GOAL:   Patient will meet greater than or equal to 90% of their needs - Progressing with tube feeds   MONITOR:   Diet advancement, TF tolerance, Labs, I & O's, Skin  REASON FOR ASSESSMENT:   Consult Assessment of nutrition requirement/status, Enteral/tube feeding initiation and management  ASSESSMENT:  [name] is a 68 yo male who was diagnosed with uT2N0 adenocarcinoma of the gastric cardia earlier this year. EUS showed evidence of invasion into the GE junction. The patient completed 3 cycles neoadjuvant FLOT with some complications, and was not able to tolerate further chemotherapy. Presented to [facility] for surgery now post op total gastrectomy with distal esophagectomy, roux-en-Y esophagojejunostomy, J-tube. PMH of T2DM, GERD, HTN, a-fib, hernia repair.  9/3 - total gastrectomy with distal esophagectomy, roux-en-Y esophagojejunostomy, J-tube, chest tube, NGT placed to lIS  9/4 - s/p Thoracoscopy repair of  esophageal leak using Myriad Matrix  9/6 - transferred to ICU for hypoxia and increased o2 requirements 9/7 - Trickle tube feeds started, 20 ml/hr 9/8 - Tube feeds advanced to 30 ml/hr, transferred to progressive  9/9 - Tube feeds advanced to 40 ml/hr increase by 10 ml every 6 hours until goal of 60 ml/hr  Pt resting in bed, trickle tube feeds started 9/7 at 20 ml/hr. Pt tolerated and MD increased to 30 ml/hr. Today tube feeds to be advanced to 40 ml/hr with titration orders per MD to goal. Pt has been tolerating tube feeds so far. No vomiting or abdominal pain. Pt does endorse some nausea and back pain. Had 3 BM yesterday. Pt agitated overnight and was hallucinating per RN. Pt seems better this morning. NGT to LIS.     Intake/Output Summary (Last 24 hours) at 01/21/2024 1042 Last data filed at 01/21/2024 0906 Gross per 24 hour  Intake 659.92 ml  Output 630 ml  Net 29.92 ml   Drains/Lines: Chest tube: 120 ml x 24 hours  JP abdomen drain: 55 ml x 24 hours  JP chest drain: 35 ml x 24 hours NGT: 150 ml x 24 hours   J- tube  Admit weight: 80.7 kg Current weight: 77.7 kg    Average Meal Intake: NPO  Nutritionally Relevant Medications: Scheduled Meds:  acetaminophen  (TYLENOL ) oral liquid 160 mg/5 mL  650 mg Per J Tube QID   Chlorhexidine  Gluconate Cloth  6 each Topical Daily   diazepam   5 mg Intravenous Q8H   enoxaparin  (  LOVENOX ) injection  80 mg Subcutaneous Q12H   feeding supplement (PROSource TF20)  60 mL Per Tube BID   free water   30 mL Per Tube Q8H   insulin  aspart  0-15 Units Subcutaneous Q4H   lidocaine   1 patch Transdermal Q24H   metoprolol  tartrate  50 mg Per J Tube BID   multivitamin with minerals  1 tablet Per Tube Daily   sodium chloride  flush  10-40 mL Intracatheter Q12H   thiamine   100 mg Per Tube Daily   Continuous Infusions:  diltiazem  (CARDIZEM ) infusion 7.5 mg/hr (01/21/24 0542)   feeding supplement (VITAL 1.5 CAL) 1,000 mL (01/21/24 1016)   fluconazole   (DIFLUCAN ) IV     Followed by   NOREEN ON 01/22/2024] fluconazole  (DIFLUCAN ) IV     piperacillin -tazobactam (ZOSYN )  IV 3.375 g (01/21/24 1010)   potassium chloride  10 mEq (01/21/24 1029)   Labs Reviewed: CBG ranges from 131-162 mg/dL over the last 24 hours HgbA1c 5.7  Diet Order:   Diet Order             Diet NPO time specified  Diet effective now                   EDUCATION NEEDS:   Education needs have been addressed  Skin:  Skin Assessment: Skin Integrity Issues: Skin Integrity Issues:: Incisions Incisions: Closed surgical incision, abdomen  Last BM:  9/9 - x3  Height:   Ht Readings from Last 1 Encounters:  01/16/24 5' 11 (1.803 m)    Weight:   Wt Readings from Last 1 Encounters:  01/21/24 77.7 kg    Ideal Body Weight:  78.2 kg  BMI:  Body mass index is 23.89 kg/m.  Estimated Nutritional Needs:   Kcal:  2300-2500 kcal  Protein:  120-140 gm  Fluid:  >/=2L/day   Olivia Kenning, RD Registered Dietitian  See Amion for more information

## 2024-01-21 NOTE — Care Management Important Message (Signed)
 Important Message  Patient Details  Name: [name] MRN: [id number] Date of Birth: [birth year]   Important Message Given:  Yes - Medicare IM     Jon Cruel 01/21/2024, 3:49 PM

## 2024-01-21 NOTE — Progress Notes (Signed)
 Physical Therapy Treatment Patient Details Name: [name] MRN: [id number] DOB: [birth year] Today's Date: 01/21/2024   History of Present Illness 68 y.o. male presents to [facility] hospital on 01/15/2024 with gastric CA. Pt underwent total gastrectomy with omentectomy and D2 lymphadenectomy, esophagojejunostomy with Jtube placement, splenic biopsy on 9/3. Pt developed bilious output into chest tube on 9/4, returned to OR for EGD and thoracoscopy with application of wound matrix to EJ anastomosis. PMH includes asthma, OA, COPD, GERD, DMII, MDD, emphysema.    PT Comments  Patient confused with limited participation. Able to come to sit EOB with +2 min assist and max cues for sequencing. Reported dizziness however BP stable (161/88). Refused to attempt standing because he feels awful. Sat EOB with close supervision x 3 minutes and then laterally scooted along EOB x 2 reps with good use of UEs to clear bottom off the mattress and shift to his right. Returned to supine +2 max assist with again poor sequencing by pt requiring max cues. Will need to continue to consider disposition in light of new confusion--if improves HHPT may remain appropriate.     If plan is discharge home, recommend the following: Assistance with cooking/housework;Assist for transportation;Help with stairs or ramp for entrance;Two people to help with walking and/or transfers;Direct supervision/assist for medications management;Direct supervision/assist for financial management;Supervision due to cognitive status   Can travel by private vehicle        Equipment Recommendations  Other (comment) (TBD when less confused)    Recommendations for Other Services       Precautions / Restrictions Precautions Precautions: Fall;Other (comment) Recall of Precautions/Restrictions: Impaired Precaution/Restrictions Comments: R chest tube, R JP drain x 2, J tube, NG tube Restrictions Weight Bearing Restrictions Per Provider Order: No      Mobility  Bed Mobility Overal bed mobility: Needs Assistance Bed Mobility: Rolling, Sidelying to Sit, Sit to Supine Rolling: Mod assist, Used rails Sidelying to sit: Used rails, Mod assist, +2 for physical assistance, HOB elevated   Sit to supine: +2 for physical assistance, Max assist   General bed mobility comments: cuing for sequencing throughout; physical assist to complete all movements    Transfers Overall transfer level: Needs assistance Equipment used: None              Lateral/Scoot Transfers: Min assist, +2 safety/equipment General transfer comment: pt refused to stand; did laterally scoot along EOB toward [facility] with good use of UEs (and LEs)    Ambulation/Gait                   Stairs             Wheelchair Mobility     Tilt Bed    Modified Rankin (Stroke Patients Only)       Balance Overall balance assessment: Needs assistance Sitting-balance support: Feet supported, Bilateral upper extremity supported Sitting balance-Leahy Scale: Poor                                      Communication Communication Communication: Impaired Factors Affecting Communication: Reduced clarity of speech  Cognition Arousal: Alert Behavior During Therapy: Restless   PT - Cognitive impairments: Orientation, Sequencing   Orientation impairments: Place, Time                   PT - Cognition Comments: wants to go to the hospital; could not process how to roll onto his  side and sit up Following commands: Impaired Following commands impaired: Follows one step commands with increased time    Cueing Cueing Techniques: Verbal cues, Tactile cues  Exercises      General Comments General comments (skin integrity, edema, etc.): on 3L with sats >90%; lots of output from NG tube with moving      Pertinent Vitals/Pain Pain Assessment Pain Assessment: Faces Faces Pain Scale: Hurts even more Pain Location: abdomen Pain Descriptors /  Indicators: Sore Pain Intervention(s): Limited activity within patient's tolerance, Monitored during session, RN gave pain meds during session    Home Living                          Prior Function            PT Goals (current goals can now be found in the care plan section) Acute Rehab PT Goals Patient Stated Goal: to return to independence Time For Goal Achievement: 01/31/24 Potential to Achieve Goals: Fair Progress towards PT goals: Not progressing toward goals - comment (recent medical decline)    Frequency    Min 3X/week      PT Plan      Co-evaluation              AM-PAC PT 6 Clicks Mobility   Outcome Measure  Help needed turning from your back to your side while in a flat bed without using bedrails?: A Lot Help needed moving from lying on your back to sitting on the side of a flat bed without using bedrails?: A Lot Help needed moving to and from a bed to a chair (including a wheelchair)?: Total Help needed standing up from a chair using your arms (e.g., wheelchair or bedside chair)?: Total Help needed to walk in hospital room?: Total Help needed climbing 3-5 steps with a railing? : Total 6 Click Score: 8    End of Session Equipment Utilized During Treatment: Oxygen Activity Tolerance: Treatment limited secondary to medical complications (Comment);Patient limited by pain (delirium) Patient left: in bed;with call bell/phone within reach;with bed alarm set;with nursing/sitter in room Nurse Communication: Mobility status PT Visit Diagnosis: Other abnormalities of gait and mobility (R26.89);Muscle weakness (generalized) (M62.81);Pain Pain - part of body:  (back)     Time: 8662-8645 PT Time Calculation (min) (ACUTE ONLY): 17 min  Charges:    $Therapeutic Activity: 8-22 mins PT General Charges $$ ACUTE PT VISIT: 1 Visit                      [name], PT Acute Rehabilitation Services  Office [phone]    [name] 01/21/2024,  2:05 PM

## 2024-01-21 NOTE — Progress Notes (Signed)
 Rounding Note    Patient Name: [name] Date of Encounter: 01/21/2024  [facility] Cardiologist: Redell Leiter, MD   Subjective   Had agitation overnight with tachycardia and elevated blood pressure, reviewed. More interactive today but has difficulty focusing to answer questions, talking about his wife caring for him when he leaves the hospital.   Inpatient Medications    Scheduled Meds:  acetaminophen  (TYLENOL ) oral liquid 160 mg/5 mL  650 mg Per J Tube QID   Chlorhexidine  Gluconate Cloth  6 each Topical Daily   diazepam   5 mg Intravenous Q8H   enoxaparin  (LOVENOX ) injection  80 mg Subcutaneous Q12H   feeding supplement (PROSource TF20)  60 mL Per Tube BID   free water   100 mL Per Tube Q6H   free water   30 mL Per Tube Q8H   insulin  aspart  0-15 Units Subcutaneous Q4H   lidocaine   1 patch Transdermal Q24H   metoprolol  tartrate  50 mg Per J Tube BID   multivitamin  15 mL Per Tube Daily   sodium chloride  flush  10-40 mL Intracatheter Q12H   thiamine  (VITAMIN B1) injection  100 mg Intravenous Daily   Continuous Infusions:  diltiazem  (CARDIZEM ) infusion 15 mg/hr (01/21/24 1205)   feeding supplement (VITAL 1.5 CAL) 1,000 mL (01/21/24 1016)   fluconazole  (DIFLUCAN ) IV 800 mg (01/21/24 1241)   Followed by   NOREEN ON 01/22/2024] fluconazole  (DIFLUCAN ) IV     piperacillin -tazobactam (ZOSYN )  IV 3.375 g (01/21/24 1010)   PRN Meds: albuterol , diphenhydrAMINE , haloperidol  lactate, hydrALAZINE , metoprolol  tartrate, morphine  injection, ondansetron  (ZOFRAN ) IV, oxyCODONE , sodium chloride  flush   Vital Signs    Vitals:   01/21/24 0647 01/21/24 0706 01/21/24 0911 01/21/24 1150  BP:   (!) 176/83 (!) 150/81  Pulse: (!) 112 (!) 109 (!) 103 88  Resp: (!) 26 (!) 26  20  Temp:    98.5 F (36.9 C)  TempSrc:    Oral  SpO2: 97% 98%  97%  Weight:      Height:        Intake/Output Summary (Last 24 hours) at 01/21/2024 1342 Last data filed at 01/21/2024 1301 Gross per 24 hour   Intake 180 ml  Output 1900 ml  Net -1720 ml      01/21/2024    5:00 AM 01/20/2024    5:00 AM 01/16/2024    1:41 PM  Last 3 Weights  Weight (lbs) 171 lb 4.8 oz 169 lb 15.6 oz 178 lb  Weight (kg) 77.7 kg 77.1 kg 80.74 kg      Telemetry    Converted to sinus rhythm around 6 AM - Personally Reviewed  Physical Exam   GEN: More interactive today, attempts to answer questions but has difficulty focusing. NG tube in place Neck: No JVD appreciated Cardiac: RRR, no murmurs, rubs, or gallops.  Respiratory: Coarse breath sounds bilaterally GI: Soft MS: No edema; No deformity. Neuro:  Nonfocal   New pertinent results (labs, ECG, imaging, cardiac studies)     Assessment & Plan    Atrial fibrillation, postoperative -per notes, also had afib 10/2023 in the setting of hypotension, was started on apixaban  -afib initially noted on 9/5 this admission, converted ~6AM 9/9 -CHA2DS2/VAS Stroke Risk Points=4  -currently on therapeutic lovenox , tolerating -remains on diltiazem  drip, which is managing both HR and BP. Had spikes in both today potentially due to delerium overnight -try to keep K>4, Mg >2 -once able to convert, can change to diltiazem  per tube  Gastric adenocarcinoma, s/p total gasrectomy, distal esophagectomy, roux-en-y esophagojejunostomy with takeback for repair of anastomotic leak -management per general surgery and CT surgery teams    Signed, Shelda Bruckner, MD  01/21/2024, 1:42 PM

## 2024-01-22 LAB — CBC
HCT: 33.6 % — ABNORMAL LOW (ref 39.0–52.0)
Hemoglobin: 11.3 g/dL — ABNORMAL LOW (ref 13.0–17.0)
MCH: 33 pg (ref 26.0–34.0)
MCHC: 33.6 g/dL (ref 30.0–36.0)
MCV: 98.2 fL (ref 80.0–100.0)
Platelets: 364 K/uL (ref 150–400)
RBC: 3.42 MIL/uL — ABNORMAL LOW (ref 4.22–5.81)
RDW: 13 % (ref 11.5–15.5)
WBC: 17.2 K/uL — ABNORMAL HIGH (ref 4.0–10.5)
nRBC: 0 % (ref 0.0–0.2)

## 2024-01-22 LAB — GLUCOSE, CAPILLARY
Glucose-Capillary: 131 mg/dL — ABNORMAL HIGH (ref 70–99)
Glucose-Capillary: 139 mg/dL — ABNORMAL HIGH (ref 70–99)
Glucose-Capillary: 144 mg/dL — ABNORMAL HIGH (ref 70–99)
Glucose-Capillary: 149 mg/dL — ABNORMAL HIGH (ref 70–99)
Glucose-Capillary: 155 mg/dL — ABNORMAL HIGH (ref 70–99)
Glucose-Capillary: 158 mg/dL — ABNORMAL HIGH (ref 70–99)

## 2024-01-22 LAB — BASIC METABOLIC PANEL WITH GFR
Anion gap: 13 (ref 5–15)
BUN: 15 mg/dL (ref 8–23)
CO2: 24 mmol/L (ref 22–32)
Calcium: 8.8 mg/dL — ABNORMAL LOW (ref 8.9–10.3)
Chloride: 108 mmol/L (ref 98–111)
Creatinine, Ser: 0.8 mg/dL (ref 0.61–1.24)
GFR, Estimated: >60 mL/min (ref >60)
Glucose, Bld: 160 mg/dL — ABNORMAL HIGH (ref 70–99)
Potassium: 3 mmol/L — ABNORMAL LOW (ref 3.5–5.1)
Sodium: 145 mmol/L (ref 135–145)

## 2024-01-22 LAB — MAGNESIUM: Magnesium: 1.7 mg/dL (ref 1.7–2.4)

## 2024-01-22 LAB — PHOSPHORUS: Phosphorus: 1.6 mg/dL — ABNORMAL LOW (ref 2.5–4.6)

## 2024-01-22 MED ORDER — POTASSIUM PHOSPHATES 15 MMOLE/5ML IV SOLN
30.0000 mmol | Freq: Once | INTRAVENOUS | Status: AC
  Administered 2024-01-22: 30 mmol via INTRAVENOUS
  Filled 2024-01-22: qty 10

## 2024-01-22 NOTE — Plan of Care (Signed)
  Problem: Coping: Goal: Ability to adjust to condition or change in health will improve Outcome: Progressing   Problem: Nutritional: Goal: Maintenance of adequate nutrition will improve Outcome: Progressing   Problem: Skin Integrity: Goal: Risk for impaired skin integrity will decrease Outcome: Progressing   Problem: Clinical Measurements: Goal: Respiratory complications will improve Outcome: Progressing   Problem: Activity: Goal: Risk for activity intolerance will decrease Outcome: Progressing

## 2024-01-22 NOTE — Progress Notes (Addendum)
 [address] [place] [employer] [address]             [phone]    7 Days Post-Op Esophagectomy   6 Days Post-Op ROBOT-ASSISTED THORACOSCOPY REPAIR OF ESOPHAGEAL LEAK USING MYRIAD MATRIX (N/A) Subjective: Continues restless and agitated. In mitten restraints. He was oriented to place and reported he had surgery for cancer when I saw him. Says he hurts all over.   TF at 63ml/hr Cardizem  infusion 15mg /hr.  Objective: Vital signs in last 24 hours: Temp:  [97.7 F (36.5 C)-99.9 F (37.7 C)] 99.9 F (37.7 C) (09/10 0332) Pulse Rate:  [82-107] 98 (09/10 0500) Cardiac Rhythm: Sinus tachycardia (09/10 0704) Resp:  [18-36] 30 (09/10 0500) BP: (130-176)/(76-133) 164/83 (09/10 0500) SpO2:  [91 %-99 %] 99 % (09/10 0500) Weight:  [76.8 kg] 76.8 kg (09/10 0332)     Intake/Output from previous day: 09/09 0701 - 09/10 0700 In: 1828.5 [I.V.:192.8; NG/GT:1023.7; IV Piggyback:612] Out: 2527 [Urine:1450; Emesis/NG output:200; Drains:457; Chest Tube:420] Intake/Output this shift: Total I/O In: -  Out: 300 [Urine:300]  General appearance: alert, confused and agitated.   Neurologic: responsive, moves extremities Heart: currently maintaining SR ~90-100/min. Lungs: normal respiratory effort with good air movement bilaterally, has upper airway congestion with coarse cough.  On RA with adequate O2 sats.  ABD: soft, active bowel sounds. Wound: Clean and dry dressings around chest drains, chest tube had ~457ml bilious fluid past 24 hours.  Chest JP drain also had ~449ml  drainage  for past 24 hours.   EXTS: well perfused  Lab Results: Recent Labs    01/21/24 0623 01/22/24 0215  WBC 11.7* 17.2*  HGB 12.5* 11.3*  HCT 37.3* 33.6*  PLT 283 364   BMET:  Recent Labs    01/21/24 0623 01/22/24 0215  NA 140 145  K 3.5 3.0*  CL 105 108  CO2 22 24  GLUCOSE 149* 160*  BUN 17 15  CREATININE 0.83 0.80  CALCIUM  9.2 8.8*    PT/INR: No results for input(s): LABPROT,  INR in the last 72 hours. ABG    Component Value Date/Time   PHART 7.307 (L) 01/15/2024 1459   HCO3 27.6 01/16/2024 1708   TCO2 29 01/16/2024 1708   ACIDBASEDEF 2.0 01/15/2024 1459   O2SAT 52 01/16/2024 1708   CBG (last 3)  Recent Labs    01/21/24 1927 01/21/24 2314 01/22/24 0330  GLUCAP 159* 158* 149*    Assessment/Plan: S/P Procedure(s) (LRB): ROBOT-ASSISTED THORACOSCOPY REPAIR OF ESOPHAGEAL LEAK USING MYRIAD MATRIX (N/A)  CV: maintaining SR on diltiazem  infusion and metoprolol  suspension per J-tube. K+ 3.0, supplement ordered.  On Lovenox . Cardiology following.   Pulm: Improving oxygenation,now on RA with stable sats and normal resp effort.  Continue aggressive pulmonary hygiene.  Leaving the chest tube to water  seal and the chest JP to non-compressed bulb. Appreciate efforts by PT to mobilize him as able.    GI: J tube feeds at 79ml/hr, having bowel movements.    ID: WBC 4.9-->11.7-->17 in past 48 hours. No fever, wounds OK. On empiric Zosyn  and Diflucan  for suspected anastomotic leak at esophagojejunostomy in the pleural space.    Dispo:  Discussed findings with Dr. Shyrl. Tentatively plan for EGD, possible stenting of the esophagojejunal anastomosis later today or on Thursday, 9/11.   LOS: 7 days    Laurel JUDITHANN Becket, PA-C 01/22/2024     Bilious output Will plan for stent today or tomorrow Continue drainage, and  abx coverage  Shalon Councilman MALVA Rayas

## 2024-01-22 NOTE — Progress Notes (Signed)
 PT Cancellation Note  Patient Details Name: [name] MRN: [id number] DOB: 01/26/1956   Cancelled Treatment:    Reason Eval/Treat Not Completed: Medical issues which prohibited therapy  Per RN, no PT today as pt is going for surgery due to leaking again. Will continue to follow.    [name], PT Acute Rehabilitation Services  Office [phone]  [name] 01/22/2024, 9:06 AM

## 2024-01-22 NOTE — Plan of Care (Signed)
  Problem: Education: Goal: Ability to describe self-care measures that may prevent or decrease complications (Diabetes Survival Skills Education) will improve Outcome: Progressing Goal: Individualized Educational Video(s) Outcome: Progressing   Problem: Coping: Goal: Ability to adjust to condition or change in health will improve Outcome: Progressing   Problem: Fluid Volume: Goal: Ability to maintain a balanced intake and output will improve Outcome: Progressing   Problem: Health Behavior/Discharge Planning: Goal: Ability to identify and utilize available resources and services will improve Outcome: Progressing Goal: Ability to manage health-related needs will improve Outcome: Progressing   Problem: Metabolic: Goal: Ability to maintain appropriate glucose levels will improve Outcome: Progressing   Problem: Nutritional: Goal: Maintenance of adequate nutrition will improve Outcome: Progressing Goal: Progress toward achieving an optimal weight will improve Outcome: Progressing   Problem: Skin Integrity: Goal: Risk for impaired skin integrity will decrease Outcome: Progressing   Problem: Tissue Perfusion: Goal: Adequacy of tissue perfusion will improve Outcome: Progressing   Problem: Education: Goal: Knowledge of General Education information will improve Description: Including pain rating scale, medication(s)/side effects and non-pharmacologic comfort measures Outcome: Progressing   Problem: Clinical Measurements: Goal: Will remain free from infection Outcome: Progressing Goal: Respiratory complications will improve Outcome: Progressing   Problem: Activity: Goal: Risk for activity intolerance will decrease Outcome: Progressing   Problem: Pain Managment: Goal: General experience of comfort will improve and/or be controlled Outcome: Progressing   Problem: Safety: Goal: Ability to remain free from injury will improve Outcome: Progressing

## 2024-01-22 NOTE — Progress Notes (Signed)
 Brief cardiology note:  Patient with agitation, telemetry reviewed, in sinus rhythm/sinus tach. Maintaining sinus rhythm on diltiazem  drip. Anticoagulated with lovenox . We will follow peripherally, once more stable can change to diltiazem  by J tube but likely going to have better control with IV drip continued for now.  Shelda Bruckner, MD, PhD, [facility]  [facility]  [place]  [facility] & Vascular at [facility] at [facility] [address], [address] [place], [place] [address] [phone]

## 2024-01-22 NOTE — Progress Notes (Addendum)
 6 Days Post-Op  Subjective: Remains disoriented. WBC up to 17. In sinus rhythm. Chest tube output has significantly increased.  Objective: Vital signs in last 24 hours: Temp:  [97.7 F (36.5 C)-99.9 F (37.7 C)] 99.9 F (37.7 C) (09/10 0332) Pulse Rate:  [82-107] 98 (09/10 0500) Resp:  [18-36] 30 (09/10 0500) BP: (130-176)/(76-133) 164/83 (09/10 0500) SpO2:  [91 %-99 %] 99 % (09/10 0500) Weight:  [76.8 kg] 76.8 kg (09/10 0332) Last BM Date : 01/20/24  Intake/Output from previous day: 09/09 0701 - 09/10 0700 In: 1828.5 [I.V.:192.8; NG/GT:1023.7; IV Piggyback:612] Out: 2527 [Urine:1450; Emesis/NG output:200; Drains:457; Chest Tube:420] Intake/Output this shift: Total I/O In: -  Out: 300 [Urine:300]  PE: General: resting comfortably, NAD Neuro: disoriented HEENT: NG in place with dark brown fluid. Resp: normal work of breathing on nasal cannula, R chest tube and JP both with dark brown fluid similar to NG output. CV: RRR Abdomen: soft, nondistended and nontender. Upper midline incision clean and dry. RUQ JP with serosanguinous drainage. J tube with feeds running at 50 ml/hr.    Lab Results:  Recent Labs    01/21/24 0623 01/22/24 0215  WBC 11.7* 17.2*  HGB 12.5* 11.3*  HCT 37.3* 33.6*  PLT 283 364   BMET Recent Labs    01/21/24 0623 01/22/24 0215  NA 140 145  K 3.5 3.0*  CL 105 108  CO2 22 24  GLUCOSE 149* 160*  BUN 17 15  CREATININE 0.83 0.80  CALCIUM  9.2 8.8*   PT/INR No results for input(s): LABPROT, INR in the last 72 hours. CMP     Component Value Date/Time   NA 145 01/22/2024 0215   K 3.0 (L) 01/22/2024 0215   CL 108 01/22/2024 0215   CO2 24 01/22/2024 0215   GLUCOSE 160 (H) 01/22/2024 0215   BUN 15 01/22/2024 0215   CREATININE 0.80 01/22/2024 0215   CREATININE 0.85 10/10/2023 0824   CALCIUM  8.8 (L) 01/22/2024 0215   PROT 6.7 01/10/2024 1130   ALBUMIN  3.4 (L) 01/10/2024 1130   AST 17 01/10/2024 1130   AST 15 10/10/2023 0824    ALT 11 01/10/2024 1130   ALT 11 10/10/2023 0824   ALKPHOS 32 (L) 01/10/2024 1130   BILITOT 0.6 01/10/2024 1130   BILITOT 0.5 10/10/2023 0824   GFRNONAA >60 01/22/2024 0215   GFRNONAA >60 10/10/2023 0824   GFRAA  10/16/2007 1246    >60        The eGFR has been calculated using the MDRD equation. This calculation has not been validated in all clinical   Lipase  No results found for: LIPASE   Assessment/Plan  68 yo male with gastric cardia adenocarcinoma, POD7 s/p total gastrectomy, with distal esophagectomy and roux-en-Y esophagojejunostomy, POD6 s/p takeback by thoracic surgery for RATS repair of anastomotic leak. - EJ anastomotic leak: pleural drains have increased in output and appear to be succus. Will discuss with thoracic. Continue Zosyn  and fluconazole .  - Aggressive pulmonary toilet. Continue IS, flutter valve, and chest PT. - Pain control: scheduled IV tylenol  and robaxin . PRN oxycodone  and morphine  - minimize narcotics if possible given delirium. - T2DM: on sliding scale novolog  - A-fib with RVR: Converted to sinus rhythm yesterday. On dilt gtt and scheduled metoprolol , appreciate cardiology recommendations.  - Delirium: likely related to anastomotic leak. Continue delirium precautions, prn haldol . - FEN: strict NPO, NG tube to LIS, hold tube feeds pending discussions with thoracic surgery regarding intervention for leak. - VTE: lovenox  on hold  pending discussion with thoracic - Dispo: progressive care    LOS: 7 days    Jon Dawn, MD [facility] Surgery General, Hepatobiliary and Pancreatic Surgery 01/22/24 7:24 AM  Addendum 0848: Discussed with Dr. Shyrl, who will plan to place an esophageal stent either today or tomorrow. I called the patient's sister and updated her on the patient's clinical status and the plan of care.

## 2024-01-23 ENCOUNTER — Inpatient Hospital Stay (HOSPITAL_COMMUNITY)

## 2024-01-23 ENCOUNTER — Inpatient Hospital Stay (HOSPITAL_COMMUNITY): Admitting: Certified Registered Nurse Anesthetist

## 2024-01-23 ENCOUNTER — Other Ambulatory Visit: Payer: Self-pay

## 2024-01-23 ENCOUNTER — Encounter (HOSPITAL_COMMUNITY)
Admission: RE | Disposition: A | Discharge status: Skilled Nursing Facility | Payer: Self-pay | Source: Home / Self Care | Attending: Surgery

## 2024-01-23 ENCOUNTER — Encounter: Payer: Self-pay | Admitting: Hematology

## 2024-01-23 DIAGNOSIS — I4891 Unspecified atrial fibrillation: Secondary | ICD-10-CM

## 2024-01-23 DIAGNOSIS — F17210 Nicotine dependence, cigarettes, uncomplicated: Secondary | ICD-10-CM | POA: Diagnosis not present

## 2024-01-23 DIAGNOSIS — I10 Essential (primary) hypertension: Secondary | ICD-10-CM | POA: Diagnosis not present

## 2024-01-23 DIAGNOSIS — K9189 Other postprocedural complications and disorders of digestive system: Secondary | ICD-10-CM

## 2024-01-23 HISTORY — PX: ESOPHAGEAL STENT PLACEMENT: SHX5540

## 2024-01-23 HISTORY — PX: ESOPHAGOGASTRODUODENOSCOPY: SHX5428

## 2024-01-23 LAB — POCT I-STAT 7, (LYTES, BLD GAS, ICA,H+H)
Acid-Base Excess: 2 mmol/L (ref 0.0–2.0)
Acid-Base Excess: 0 mmol/L (ref 0.0–2.0)
Bicarbonate: 28.1 mmol/L — ABNORMAL HIGH (ref 20.0–28.0)
Bicarbonate: 26 mmol/L (ref 20.0–28.0)
Calcium, Ion: 1.38 mmol/L (ref 1.15–1.40)
Calcium, Ion: 1.29 mmol/L (ref 1.15–1.40)
HCT: 32 % — ABNORMAL LOW (ref 39.0–52.0)
HCT: 31 % — ABNORMAL LOW (ref 39.0–52.0)
Hemoglobin: 10.9 g/dL — ABNORMAL LOW (ref 13.0–17.0)
Hemoglobin: 10.5 g/dL — ABNORMAL LOW (ref 13.0–17.0)
O2 Saturation: 100 %
O2 Saturation: 89 %
Patient temperature: 98.6
Patient temperature: 99
Potassium: 3.7 mmol/L (ref 3.5–5.1)
Potassium: 3.5 mmol/L (ref 3.5–5.1)
Sodium: 144 mmol/L (ref 135–145)
Sodium: 144 mmol/L (ref 135–145)
TCO2: 30 mmol/L (ref 22–32)
TCO2: 27 mmol/L (ref 22–32)
pCO2 arterial: 49.5 mmHg — ABNORMAL HIGH (ref 32–48)
pCO2 arterial: 46 mmHg (ref 32–48)
pH, Arterial: 7.362 (ref 7.35–7.45)
pH, Arterial: 7.362 (ref 7.35–7.45)
pO2, Arterial: 244 mmHg — ABNORMAL HIGH (ref 83–108)
pO2, Arterial: 59 mmHg — ABNORMAL LOW (ref 83–108)

## 2024-01-23 LAB — CBC
HCT: 38 % — ABNORMAL LOW (ref 39.0–52.0)
Hemoglobin: 12.1 g/dL — ABNORMAL LOW (ref 13.0–17.0)
MCH: 32.8 pg (ref 26.0–34.0)
MCHC: 31.8 g/dL (ref 30.0–36.0)
MCV: 103 fL — ABNORMAL HIGH (ref 80.0–100.0)
Platelets: 472 K/uL — ABNORMAL HIGH (ref 150–400)
RBC: 3.69 MIL/uL — ABNORMAL LOW (ref 4.22–5.81)
RDW: 13.2 % (ref 11.5–15.5)
WBC: 34.2 K/uL — ABNORMAL HIGH (ref 4.0–10.5)
nRBC: 0 % (ref 0.0–0.2)

## 2024-01-23 LAB — BASIC METABOLIC PANEL WITH GFR
Anion gap: 11 (ref 5–15)
BUN: 13 mg/dL (ref 8–23)
CO2: 29 mmol/L (ref 22–32)
Calcium: 8.8 mg/dL — ABNORMAL LOW (ref 8.9–10.3)
Chloride: 105 mmol/L (ref 98–111)
Creatinine, Ser: 0.86 mg/dL (ref 0.61–1.24)
GFR, Estimated: >60 mL/min (ref >60)
Glucose, Bld: 171 mg/dL — ABNORMAL HIGH (ref 70–99)
Potassium: 3.6 mmol/L (ref 3.5–5.1)
Sodium: 145 mmol/L (ref 135–145)

## 2024-01-23 LAB — GLUCOSE, CAPILLARY
Glucose-Capillary: 120 mg/dL — ABNORMAL HIGH (ref 70–99)
Glucose-Capillary: 134 mg/dL — ABNORMAL HIGH (ref 70–99)
Glucose-Capillary: 148 mg/dL — ABNORMAL HIGH (ref 70–99)
Glucose-Capillary: 155 mg/dL — ABNORMAL HIGH (ref 70–99)
Glucose-Capillary: 155 mg/dL — ABNORMAL HIGH (ref 70–99)
Glucose-Capillary: 163 mg/dL — ABNORMAL HIGH (ref 70–99)

## 2024-01-23 LAB — ALBUMIN: Albumin: 1.9 g/dL — ABNORMAL LOW (ref 3.5–5.0)

## 2024-01-23 LAB — PHOSPHORUS: Phosphorus: 7.3 mg/dL — ABNORMAL HIGH (ref 2.5–4.6)

## 2024-01-23 SURGERY — EGD (ESOPHAGOGASTRODUODENOSCOPY)
Anesthesia: General | Site: Esophagus

## 2024-01-23 MED ORDER — LACTATED RINGERS IV SOLN: INTRAVENOUS | Status: DC

## 2024-01-23 MED ORDER — PROPOFOL 10 MG/ML IV BOLUS
INTRAVENOUS | Status: AC
Start: 2024-01-23 — End: 2024-01-23
  Filled 2024-01-23: qty 20

## 2024-01-23 MED ORDER — POTASSIUM CHLORIDE 10 MEQ/100ML IV SOLN
10.0000 meq | INTRAVENOUS | Status: AC
  Administered 2024-01-23 (×4): 10 meq via INTRAVENOUS
  Filled 2024-01-23 (×4): qty 100

## 2024-01-23 MED ORDER — PROPOFOL 1000 MG/100ML IV EMUL
0.0000 ug/kg/min | INTRAVENOUS | Status: DC
  Administered 2024-01-23: 50 ug/kg/min via INTRAVENOUS
  Administered 2024-01-23: 20 ug/kg/min via INTRAVENOUS
  Administered 2024-01-23: 10 ug/kg/min via INTRAVENOUS
  Administered 2024-01-24: 20 ug/kg/min via INTRAVENOUS
  Filled 2024-01-23 (×4): qty 100

## 2024-01-23 MED ORDER — ALBUMIN HUMAN 5 % IV SOLN
12.5000 g | Freq: Once | INTRAVENOUS | Status: AC
  Administered 2024-01-23: 12.5 g via INTRAVENOUS
  Filled 2024-01-23: qty 250

## 2024-01-23 MED ORDER — OXYCODONE HCL 5 MG/5ML PO SOLN
10.0000 mg | Freq: Four times a day (QID) | ORAL | Status: DC
  Administered 2024-01-23 – 2024-01-24 (×6): 10 mg via JEJUNOSTOMY
  Filled 2024-01-23 (×6): qty 10

## 2024-01-23 MED ORDER — PHENYLEPHRINE HCL (PRESSORS) 10 MG/ML IV SOLN
INTRAVENOUS | Status: AC
  Filled 2024-01-23: qty 1

## 2024-01-23 MED ORDER — PHENYLEPHRINE HCL (PRESSORS) 10 MG/ML IV SOLN
INTRAVENOUS | Status: DC | PRN
  Administered 2024-01-23 (×2): 160 ug via INTRAVENOUS
  Administered 2024-01-23: 240 ug via INTRAVENOUS

## 2024-01-23 MED ORDER — PHENYLEPHRINE 80 MCG/ML (10ML) SYRINGE FOR IV PUSH (FOR BLOOD PRESSURE SUPPORT)
PREFILLED_SYRINGE | INTRAVENOUS | Status: AC
Start: 2024-01-23 — End: 2024-01-23
  Filled 2024-01-23: qty 10

## 2024-01-23 MED ORDER — FENTANYL 2500MCG IN NS 250ML (10MCG/ML) PREMIX INFUSION
0.0000 ug/h | INTRAVENOUS | Status: DC
  Administered 2024-01-23: 25 ug/h via INTRAVENOUS
  Filled 2024-01-23: qty 250

## 2024-01-23 MED ORDER — ALBUMIN HUMAN 25 % IV SOLN
25.0000 g | Freq: Once | INTRAVENOUS | Status: AC
  Administered 2024-01-23: 25 g via INTRAVENOUS
  Filled 2024-01-23: qty 100

## 2024-01-23 MED ORDER — VASOPRESSIN 20 UNIT/ML IV SOLN
INTRAVENOUS | Status: AC
  Filled 2024-01-23: qty 1

## 2024-01-23 MED ORDER — ORAL CARE MOUTH RINSE
15.0000 mL | OROMUCOSAL | Status: DC | PRN
  Administered 2024-01-23: 15 mL via OROMUCOSAL

## 2024-01-23 MED ORDER — ORAL CARE MOUTH RINSE
15.0000 mL | OROMUCOSAL | Status: DC
  Administered 2024-01-23 – 2024-01-25 (×30): 15 mL via OROMUCOSAL

## 2024-01-23 MED ORDER — SODIUM CHLORIDE 0.9 % IV SOLN
4.0000 g | Freq: Once | INTRAVENOUS | Status: AC
  Administered 2024-01-23: 4 g via INTRAVENOUS
  Filled 2024-01-23: qty 40

## 2024-01-23 MED ORDER — ENOXAPARIN SODIUM 40 MG/0.4ML IJ SOSY
40.0000 mg | PREFILLED_SYRINGE | INTRAMUSCULAR | Status: DC
Start: 2024-01-23 — End: 2024-01-28
  Administered 2024-01-23 – 2024-01-27 (×5): 40 mg via SUBCUTANEOUS
  Filled 2024-01-23 (×6): qty 0.4

## 2024-01-23 MED ORDER — ALBUMIN HUMAN 5 % IV SOLN: INTRAVENOUS | Status: DC | PRN

## 2024-01-23 MED ORDER — POLYETHYLENE GLYCOL 3350 17 G PO PACK: 17.0000 g | PACK | Freq: Every day | ORAL | Status: DC

## 2024-01-23 MED ORDER — LACTATED RINGERS IV BOLUS
500.0000 mL | Freq: Once | INTRAVENOUS | Status: AC
  Administered 2024-01-23: 500 mL via INTRAVENOUS

## 2024-01-23 MED ORDER — FENTANYL BOLUS VIA INFUSION
25.0000 ug | INTRAVENOUS | Status: DC | PRN
  Administered 2024-01-23: 25 ug via INTRAVENOUS
  Administered 2024-01-23: 50 ug via INTRAVENOUS
  Administered 2024-01-24: 100 ug via INTRAVENOUS

## 2024-01-23 MED ORDER — DOCUSATE SODIUM 50 MG/5ML PO LIQD: 100.0000 mg | Freq: Two times a day (BID) | ORAL | Status: DC

## 2024-01-23 SURGICAL SUPPLY — 18 items
BUTTON OLYMPUS DEFENDO 5 PIECE (MISCELLANEOUS) ×2 IMPLANT
CANISTER SUCTION 3000ML PPV (SUCTIONS) ×2 IMPLANT
CNTNR URN SCR LID CUP LEK RST (MISCELLANEOUS) ×2 IMPLANT
GAUZE SPONGE 4X4 12PLY STRL (GAUZE/BANDAGES/DRESSINGS) ×2 IMPLANT
GLOVE BIO SURGEON STRL SZ7.5 (GLOVE) ×2 IMPLANT
GOWN STRL SURGICAL XL XLNG (GOWN DISPOSABLE) ×2 IMPLANT
MARKER SKIN DUAL TIP RULER LAB (MISCELLANEOUS) ×2 IMPLANT
NS IRRIG 1000ML POUR BTL (IV SOLUTION) ×2 IMPLANT
OIL SILICONE PENTAX (PARTS (SERVICE/REPAIRS)) IMPLANT
PAD ARMBOARD POSITIONER FOAM (MISCELLANEOUS) ×4 IMPLANT
STENT WALLFLEX ESOPH 23X125MM (Stent) IMPLANT
SYR 20ML ECCENTRIC (SYRINGE) ×2 IMPLANT
TOWEL GREEN STERILE FF (TOWEL DISPOSABLE) ×2 IMPLANT
TUBE CONNECTING 20X1/4 (TUBING) ×2 IMPLANT
TUBING ENDO SMARTCAP (MISCELLANEOUS) ×2 IMPLANT
UNDERPAD 30X36 HEAVY ABSORB (UNDERPADS AND DIAPERS) ×2 IMPLANT
WATER STERILE IRR 1000ML POUR (IV SOLUTION) ×2 IMPLANT
WIRE HYDRA 450CM (MISCELLANEOUS) IMPLANT

## 2024-01-23 NOTE — Progress Notes (Addendum)
 7 Days Post-Op Procedure(s) (LRB): ROBOT-ASSISTED THORACOSCOPY REPAIR OF ESOPHAGEAL LEAK USING MYRIAD MATRIX (N/A) Subjective: Sedated on vent  Objective: Vital signs in last 24 hours: Temp:  [98.1 F (36.7 C)-98.8 F (37.1 C)] 98.8 F (37.1 C) (09/11 0400) Pulse Rate:  [69-102] 70 (09/11 0700) Cardiac Rhythm: Normal sinus rhythm (09/11 0321) Resp:  [13-35] 24 (09/11 0700) BP: (85-172)/(65-91) 130/79 (09/11 0700) SpO2:  [93 %-100 %] 100 % (09/11 0700) FiO2 (%):  [60 %-100 %] 60 % (09/11 0529) Weight:  [76.9 kg] 76.9 kg (09/11 0600)  Hemodynamic parameters for last 24 hours:    Intake/Output from previous day: 09/10 0701 - 09/11 0700 In: 3047.8 [I.V.:434.9; NG/GT:716.7; IV Piggyback:1896.2] Out: 2011 [Urine:1250; Emesis/NG output:100; Drains:205; Chest Tube:456] Intake/Output this shift: No intake/output data recorded.  General appearance: sedated on vent Heart: regular rate and rhythm Lungs: coarse Abdomen: soft, non distended Extremities: cool Wound: incis appear stable  Lab Results: Recent Labs    01/22/24 0215 01/23/24 0326 01/23/24 0527  WBC 17.2* 34.2*  --   HGB 11.3* 12.1* 10.9*  HCT 33.6* 38.0* 32.0*  PLT 364 472*  --    BMET:  Recent Labs    01/22/24 0215 01/23/24 0326 01/23/24 0527  NA 145 145 144  K 3.0* 3.6 3.7  CL 108 105  --   CO2 24 29  --   GLUCOSE 160* 171*  --   BUN 15 13  --   CREATININE 0.80 0.86  --   CALCIUM  8.8* 8.8*  --     PT/INR: No results for input(s): LABPROT, INR in the last 72 hours. ABG    Component Value Date/Time   PHART 7.362 01/23/2024 0527   HCO3 28.1 (H) 01/23/2024 0527   TCO2 30 01/23/2024 0527   ACIDBASEDEF 2.0 01/15/2024 1459   O2SAT 100 01/23/2024 0527   CBG (last 3)  Recent Labs    01/22/24 2354 01/23/24 0316 01/23/24 0727  GLUCAP 158* 163* 120*    Meds Scheduled Meds:  acetaminophen  (TYLENOL ) oral liquid 160 mg/5 mL  650 mg Per J Tube QID   Chlorhexidine  Gluconate Cloth  6 each Topical  Daily   diazepam   5 mg Intravenous Q8H   docusate  100 mg Per Tube BID   feeding supplement (PROSource TF20)  60 mL Per Tube BID   free water   100 mL Per Tube Q6H   insulin  aspart  0-15 Units Subcutaneous Q4H   lidocaine   1 patch Transdermal Q24H   metoprolol  tartrate  50 mg Per J Tube BID   multivitamin  15 mL Per Tube Daily   mouth rinse  15 mL Mouth Rinse Q2H   polyethylene glycol  17 g Per Tube Daily   sodium chloride  flush  10-40 mL Intracatheter Q12H   thiamine  (VITAMIN B1) injection  100 mg Intravenous Daily   Continuous Infusions:  diltiazem  (CARDIZEM ) infusion 5 mg/hr (01/23/24 0700)   feeding supplement (VITAL 1.5 CAL) Stopped (01/22/24 0721)   fentaNYL  infusion INTRAVENOUS 25 mcg/hr (01/23/24 0700)   fluconazole  (DIFLUCAN ) IV Stopped (01/22/24 1132)   piperacillin -tazobactam (ZOSYN )  IV Stopped (01/23/24 0549)   potassium chloride  100 mL/hr at 01/23/24 0700   propofol  (DIPRIVAN ) infusion 10 mcg/kg/min (01/23/24 0700)   PRN Meds:.albuterol , diphenhydrAMINE , fentaNYL , haloperidol  lactate, hydrALAZINE , metoprolol  tartrate, morphine  injection, ondansetron  (ZOFRAN ) IV, mouth rinse, oxyCODONE , sodium chloride  flush  Xrays   Assessment/Plan: S/P Procedure(s) (LRB): ROBOT-ASSISTED THORACOSCOPY REPAIR OF ESOPHAGEAL LEAK USING MYRIAD MATRIX (N/A) 1 afeb, s BP quite variable- but most  recent values are good, NSR 2 re- intubated and transferred to ICU overnight 3 adeq UOP- renal fxn normal 4 drains w/ mod amts 5 significant increase in leukocytosis, WBC 34- cont current abx 6 H/H stable 7 CXR stable   Plan  for stent today, no change to current management from CT perspective for now   LOS: 8 days    Lemond FORBES Cera PA-C Pager [phone] 01/23/2024   OR today for EGD  Linnie MALVA Rayas

## 2024-01-23 NOTE — Progress Notes (Signed)
 7 Days Post-Op  Subjective: Developed increased WOB and became unresponsive overnight, and was intubated. Mild hypotension after intubation, improved with IV fluid bolus. Responsive to verbal stimuli this morning and follows commands with some delay. WBC up to 34, renal function remains normal, making urine.  Objective: Vital signs in last 24 hours: Temp:  [98.1 F (36.7 C)-98.8 F (37.1 C)] 98.8 F (37.1 C) (09/11 0400) Pulse Rate:  [69-102] 70 (09/11 0700) Resp:  [13-35] 24 (09/11 0700) BP: (85-172)/(65-91) 130/79 (09/11 0700) SpO2:  [93 %-100 %] 100 % (09/11 0700) FiO2 (%):  [60 %-100 %] 60 % (09/11 0529) Weight:  [76.9 kg] 76.9 kg (09/11 0600) Last BM Date : 01/23/24  Intake/Output from previous day: 09/10 0701 - 09/11 0700 In: 3047.8 [I.V.:434.9; NG/GT:716.7; IV Piggyback:1896.2] Out: 2011 [Urine:1250; Emesis/NG output:100; Drains:205; Chest Tube:456] Intake/Output this shift: No intake/output data recorded.  PE: General: resting in bed, sedated Neuro: sedated, opens eyes to verbal stimuli, weakly follows commands HEENT: NG in place with dark brown fluid. Resp: intubated, on vent. R chest tube and pleural JP with bilious fluid. CV: RRR Abdomen: very soft and nondistended. Upper midline incision clean and dry. RUQ JP with serosanguinous drainage. J tube capped.    Lab Results:  Recent Labs    01/22/24 0215 01/23/24 0326 01/23/24 0527  WBC 17.2* 34.2*  --   HGB 11.3* 12.1* 10.9*  HCT 33.6* 38.0* 32.0*  PLT 364 472*  --    BMET Recent Labs    01/22/24 0215 01/23/24 0326 01/23/24 0527  NA 145 145 144  K 3.0* 3.6 3.7  CL 108 105  --   CO2 24 29  --   GLUCOSE 160* 171*  --   BUN 15 13  --   CREATININE 0.80 0.86  --   CALCIUM  8.8* 8.8*  --    PT/INR No results for input(s): LABPROT, INR in the last 72 hours. CMP     Component Value Date/Time   NA 144 01/23/2024 0527   K 3.7 01/23/2024 0527   CL 105 01/23/2024 0326   CO2 29 01/23/2024 0326    GLUCOSE 171 (H) 01/23/2024 0326   BUN 13 01/23/2024 0326   CREATININE 0.86 01/23/2024 0326   CREATININE 0.85 10/10/2023 0824   CALCIUM  8.8 (L) 01/23/2024 0326   PROT 6.7 01/10/2024 1130   ALBUMIN  1.9 (L) 01/23/2024 0326   AST 17 01/10/2024 1130   AST 15 10/10/2023 0824   ALT 11 01/10/2024 1130   ALT 11 10/10/2023 0824   ALKPHOS 32 (L) 01/10/2024 1130   BILITOT 0.6 01/10/2024 1130   BILITOT 0.5 10/10/2023 0824   GFRNONAA >60 01/23/2024 0326   GFRNONAA >60 10/10/2023 0824   GFRAA  10/16/2007 1246    >60        The eGFR has been calculated using the MDRD equation. This calculation has not been validated in all clinical   Lipase  No results found for: LIPASE   Assessment/Plan  68 yo male with gastric cardia adenocarcinoma, POD8 s/p total gastrectomy, with distal esophagectomy and roux-en-Y esophagojejunostomy, POD7 s/p takeback by thoracic surgery for RATS repair of anastomotic leak. - Vent-dependent respiratory failure: secondary to sepsis from EJ anastomotic leak. Continue full vent support, trauma critical care consulted for assistance with vent management. - EJ anastomotic leak: Continue broad-spectrum antibiotics (Zosyn  and fluconazole ). To return to OR with thoracic surgery today for EGD and esophageal stent placement. - Pain/sedation - T2DM: on sliding scale novolog  - A-fib  with RVR: Converted to sinus rhythm 9/9, remains in sinus today. Continue dilt gtt per cardiology, will transition to PO dilt via J tube when more stable. On scheduled metoprolol  via J tube. - FEN: strict NPO, NG tube to LIS, tube feeds on hold - VTE: prophylactic lovenox , therapeutic anticoagulation (for a-fib) on hold for procedure today - Dispo: ICU    LOS: 8 days    Leonor Dawn, MD [facility] Surgery General, Hepatobiliary and Pancreatic Surgery 01/23/24 7:50 AM

## 2024-01-23 NOTE — Op Note (Signed)
      [address]       [name] [address]             [phone]        01/23/2024  Patient:  Vinie Purdue Pre-Op Dx: Anastomotic leak   Post-op Dx:  same Procedure: - Esophagogastroscopy - 125 mm covered esophageal stent placement   Surgeon and Role:      * Doreen Garretson, Linnie KIDD, MD - Primary  Anesthesia  general EBL:  none Blood Administration: none Specimen:  non   Counts: correct   Indications: 68yo male with hx of esophagojejunostomy who has developed an anastomotic leak.  We was taken for stent coverage. Findings: Viable mucosa.  No obvious defect was noted.    Operative Technique: After the risks, benefits and alternatives were thoroughly discussed, the patient was brought to the operative theatre.  Anesthesia was induced. The patient was prepped and draped in normal sterile fashion.  An appropriate surgical pause was performed, and pre-operative antibiotics were dosed accordingly.  The gastroscope was advanced through the oropharynx into the cervical esophagus under direct visualization.  The scope was passed through the anastomosis into the jejunum.  The scope was then pulled back, and the esophageal mucosa was visualized.  The anastomosis was marked on the skin with paper clips.    Next a Jag wire was passed through the gastroscope into the jejunum with fluoroscopic guidance.  The esophageal stent was then placed over the wire, and positioned under fluoroscopy to cover the anastomosis.    The patient tolerated the procedure without any immediate complications, and was transferred to the ICU in stable condition.  Cristianna Cyr KIDD Rayas

## 2024-01-23 NOTE — Progress Notes (Signed)
 Called to bedside for pt desat. Upon arrival patient was having increased WOB, using abdominal muscles, tachypnea, desatting into the 80s requiring additional O2. Pt suctioned orally and nasal obtaining a moderate amount of thick yellow secretions.  Rapid RN and MD at bedside. Pt transferred to 4N-ICU room# 30 and  intubated. ABG /CXR ordered. Will continue to monitor.

## 2024-01-23 NOTE — Progress Notes (Addendum)
 Called due to patient being unresponsive and desatting to low 80s. On my arrival, grunting to sternal rub and does wiggle toes to command but increasing work of breathing with extensive use of abdominal muscles. Abdomen soft, no evidence of guarding or tenderness.  Per chart review, rising WBC. Concern for anastomotic leak at EJ anastomosis.   Patient hypertensive and regular rate. Given desats, increased work of breathing, minimal response, decision made for intubation.   Repeat CBC is 34.2, BMP pending. Post intubation CXR pending.   Patient is going for EGD later today, possible stenting.  I called patient's sister, [name], and discussed with her transfer to ICU and intubation. Hemodynamically stable but reiterated to her that he is critically ill.  Addendum: BP downtrending, SBP in 90s but MAP still adequate at 75. Patient does seem be developing septic picture, LR bolus and calcium  given. Propofol  decreased to 5. Dilt gtt decreased to 5.  Critical Care Time: 70 minutes  Orie Silversmith, MD [facility] Surgery

## 2024-01-23 NOTE — Progress Notes (Signed)
 Red drainage from NG tube. Notified Dr. Shyrl via phone. No new orders at this time. Dr. Dasie and Dr. Sebastian made aware.

## 2024-01-23 NOTE — Progress Notes (Signed)
 I updated patient's sister [name] Idell Eagles via phone on patient's clinical status. She is aware of overnight events and plans for stent today with thoracic surgery.

## 2024-01-23 NOTE — Progress Notes (Signed)
 Patient ID: Jean Skow, male   DOB: 1956/02/07, 68 y.o.   MRN: [id number] Follow up - Trauma Critical Care   Patient Details:    Emil Weigold is an 68 y.o. male.  Lines/tubes : Airway 7.5 mm (Active)  Secured at (cm) 24 cm 01/23/24 0828  Measured From Lips 01/23/24 0828  Secured Location Center 01/23/24 0828  Secured By [employer] 01/23/24 0828  Bite Block No 01/23/24 0828  Tube Holder Repositioned Yes 01/23/24 0828  Prone position No 01/23/24 0828  Cuff Pressure (cm H2O) Clear OR 27-39 CmH2O 01/23/24 0828  Site Condition Dry 01/23/24 0828     Implanted Port 08/29/23 Right Chest Single Power (Active)  Site Assessment Clean, Dry, Intact 01/23/24 0400  Port Status Accessed 01/23/24 0400  Needle Size (Only chart with needle placement) H 20 gauge 01/19/24 2000  Line Status Infusing 01/23/24 0400  Line Care Connections checked and tightened 01/23/24 0400  Dressing Type Transparent 01/23/24 0400  Dressing Status Antimicrobial disc/dressing in place;Clean, Dry, Intact 01/23/24 0400  Dressing Intervention New dressing 01/18/24 1457  Needle Change Due 01/25/24 01/23/24 0400     Chest Tube 1 Right Pleural 28 Fr. (Active)  Status To water  seal 01/23/24 0321  Chest Tube Air Leak None 01/23/24 0321  Patency Intervention Tip/tilt 01/23/24 0321  Drainage Description Yellow;Other (Comment) 01/23/24 0321  Dressing Status Clean, Dry, Intact 01/23/24 0321  Dressing Intervention Assessed, no intervention needed 01/23/24 0321  Site Assessment Clean, Dry, Intact 01/23/24 0321  Surrounding Skin Unable to view;Intact;Dry 01/23/24 0321  Intake (mL) 0 mL 01/20/24 0600  Output (mL) 86 mL 01/23/24 0500     Closed System Drain 1 Right;Lateral Abdomen Bulb (JP) 19 Fr. (Active)  Site Description Unremarkable 01/22/24 2000  Dressing Status Clean, Dry, Intact 01/22/24 2000  Drainage Appearance Serosanguineous 01/23/24 0545  Status To suction (Charged) 01/23/24 0545  Intake (mL) 0 ml  01/20/24 1930  Output (mL) 30 mL 01/23/24 0545     Closed System Drain 1 Right Chest Bulb (JP) 19 Fr. (Active)  Site Description Unremarkable 01/22/24 2000  Dressing Status Clean, Dry, Intact 01/22/24 2000  Drainage Appearance Purulent;Green;Yellow;Tan 01/23/24 0545  Status To gravity (Uncharged) 01/23/24 0545  Intake (mL) 0 ml 01/20/24 1930  Output (mL) 30 mL 01/23/24 0545     NG/OG Vented/Dual Lumen 18 Fr. Right nare 55 cm (Active)  Tube Position (Required) Marking at nare/corner of mouth 01/23/24 0321  Measurement (cm) (Required) 55 cm 01/23/24 0321  Ongoing Placement Verification (Required) (See row information) Yes 01/23/24 0321  Site Assessment Bridle hanging straight down;Intact;Clean, Dry, Intact 01/23/24 0321  Interventions Cleansed 01/22/24 0820  Status Low intermittent suction 01/23/24 0321  Amount of suction 80 mmHg 01/22/24 1220  Drainage Appearance Yellow;Thick;Green 01/23/24 0321  Intake (mL) 0 mL 01/21/24 0542  Output (mL) 0 mL 01/23/24 0500     Gastrostomy/Enterostomy Jejunostomy 14 Fr. LUQ (Active)  Surrounding Skin Dry;Intact 01/23/24 0321  Tube Status/Interventions Irrigated;Clamped 01/23/24 0600  Drainage Appearance None 01/23/24 0321  Catheter Position (cm marking) 5 cm 01/20/24 1930  Dressing Status Clean, Dry, Intact 01/23/24 0321  Dressing Intervention Assessed, no intervention needed 01/23/24 0321  Dressing Type Split gauze 01/22/24 1220  G Port Intake (mL) 0 ml 01/22/24 1900  J Port Intake (mL) 0 ml 01/22/24 1900  Output (mL) 0 mL 01/22/24 1900     Flatus Tube/Pouch (Active)  Output (mL) 0 mL 01/23/24 0657     Urethral Catheter Olivia Gallagher Straight-tip 16 Fr. (Active)  Indication  for Insertion or Continuance of Catheter Unstable critically ill patients first 24-48 hours (See Criteria) 01/23/24 0400  Site Assessment Clean, Dry, Intact 01/23/24 0400  Catheter Maintenance Bag below level of bladder;Catheter secured;Drainage bag/tubing not touching  floor;Insertion date on drainage bag;No dependent loops;Seal intact 01/23/24 0400  Collection Container Standard drainage bag 01/23/24 0400  Securement Method Adhesive securement device 01/23/24 0400  Output (mL) 75 mL 01/23/24 0657    Microbiology/Sepsis markers: Results for orders placed or performed during the hospital encounter of 01/10/24  Surgical pcr screen     Status: None   Collection Time: 01/10/24 11:13 AM   Specimen: Nasal Mucosa; Nasal Swab  Result Value Ref Range Status   MRSA, PCR NEGATIVE NEGATIVE Final   Staphylococcus aureus NEGATIVE NEGATIVE Final    Comment: (NOTE) The Xpert SA Assay (FDA approved for NASAL specimens in patients 28 years of age and older), is one component of a comprehensive surveillance program. It is not intended to diagnose infection nor to guide or monitor treatment. Performed at [facility], [address], [place], [place] [address]     Anti-infectives:  Anti-infectives (From admission, onward)    Start     Dose/Rate Route Frequency Ordered Stop   01/22/24 1000  fluconazole  (DIFLUCAN ) IVPB 400 mg       Placed in Followed by Linked Group   400 mg 100 mL/hr over 120 Minutes Intravenous Every 24 hours 01/21/24 0956     01/21/24 1045  fluconazole  (DIFLUCAN ) IVPB 800 mg       Placed in Followed by Linked Group   800 mg 200 mL/hr over 120 Minutes Intravenous  Once 01/21/24 0956 01/21/24 1441   01/21/24 0945  piperacillin -tazobactam (ZOSYN ) IVPB 3.375 g        3.375 g 12.5 mL/hr over 240 Minutes Intravenous Every 8 hours 01/21/24 0920     01/16/24 1510  ceFAZolin  (ANCEF ) 2-4 GM/100ML-% IVPB       Note to Pharmacy: Cindie Pizza: cabinet override      01/16/24 1510 01/17/24 0314   01/15/24 0645  ceFAZolin  (ANCEF ) IVPB 2g/100 mL premix       Placed in And Linked Group   2 g 200 mL/hr over 30 Minutes Intravenous On call to O.R. 01/15/24 0641 01/15/24 1700   01/15/24 0645  metroNIDAZOLE  (FLAGYL ) IVPB 500 mg       Placed in  And Linked Group   500 mg 100 mL/hr over 60 Minutes Intravenous On call to O.R. 01/15/24 9358 01/15/24 0910   01/15/24 9357  ceFAZolin  (ANCEF ) IVPB 2g/100 mL premix  Status:  Discontinued        2 g 200 mL/hr over 30 Minutes Intravenous 30 min pre-op 01/15/24 9357 01/15/24 0644        Consults: Treatment Team:  Shyrl Linnie KIDD, MD    Studies:    Events:  Subjective:    Overnight Issues:  Intubated and moved to ICU overnight Objective:  Vital signs for last 24 hours: Temp:  [98.2 F (36.8 C)-99 F (37.2 C)] 99 F (37.2 C) (09/11 0804) Pulse Rate:  [69-102] 70 (09/11 0700) Resp:  [13-35] 24 (09/11 0700) BP: (85-172)/(65-91) 130/79 (09/11 0700) SpO2:  [93 %-100 %] 100 % (09/11 0700) FiO2 (%):  [50 %-100 %] 50 % (09/11 0828) Weight:  [76.9 kg] 76.9 kg (09/11 0600)  Hemodynamic parameters for last 24 hours:    Intake/Output from previous day: 09/10 0701 - 09/11 0700 In: 3047.8 [I.V.:434.9; NG/GT:716.7; IV Piggyback:1896.2] Out:  2011 [Urine:1250; Emesis/NG output:100; Drains:205; Chest Tube:456]  Intake/Output this shift: No intake/output data recorded.  Vent settings for last 24 hours: Vent Mode: PRVC FiO2 (%):  [50 %-100 %] 50 % Set Rate:  [20 bmp] 20 bmp Vt Set:  [580 mL] 580 mL PEEP:  [8 cmH20] 8 cmH20 Plateau Pressure:  [25 cmH20-27 cmH20] 27 cmH20  Physical Exam:  General: on vent Neuro: sedated HEENT/Neck: ETT Resp: clear to auscultation bilaterally CVS: RRR GI: soft, incision CDI, J tube in place Extremities: calves soft  Results for orders placed or performed during the hospital encounter of 01/15/24 (from the past 24 hours)  Glucose, capillary     Status: Abnormal   Collection Time: 01/22/24 11:27 AM  Result Value Ref Range   Glucose-Capillary 139 (H) 70 - 99 mg/dL  Glucose, capillary     Status: Abnormal   Collection Time: 01/22/24  3:56 PM  Result Value Ref Range   Glucose-Capillary 131 (H) 70 - 99 mg/dL  Glucose, capillary      Status: Abnormal   Collection Time: 01/22/24  7:31 PM  Result Value Ref Range   Glucose-Capillary 155 (H) 70 - 99 mg/dL  Glucose, capillary     Status: Abnormal   Collection Time: 01/22/24 11:54 PM  Result Value Ref Range   Glucose-Capillary 158 (H) 70 - 99 mg/dL  Glucose, capillary     Status: Abnormal   Collection Time: 01/23/24  3:16 AM  Result Value Ref Range   Glucose-Capillary 163 (H) 70 - 99 mg/dL  Phosphorus     Status: Abnormal   Collection Time: 01/23/24  3:26 AM  Result Value Ref Range   Phosphorus 7.3 (H) 2.5 - 4.6 mg/dL  CBC     Status: Abnormal   Collection Time: 01/23/24  3:26 AM  Result Value Ref Range   WBC 34.2 (H) 4.0 - 10.5 K/uL   RBC 3.69 (L) 4.22 - 5.81 MIL/uL   Hemoglobin 12.1 (L) 13.0 - 17.0 g/dL   HCT 61.9 (L) 60.9 - 47.9 %   MCV 103.0 (H) 80.0 - 100.0 fL   MCH 32.8 26.0 - 34.0 pg   MCHC 31.8 30.0 - 36.0 g/dL   RDW 86.7 88.4 - 84.4 %   Platelets 472 (H) 150 - 400 K/uL   nRBC 0.0 0.0 - 0.2 %  Basic metabolic panel     Status: Abnormal   Collection Time: 01/23/24  3:26 AM  Result Value Ref Range   Sodium 145 135 - 145 mmol/L   Potassium 3.6 3.5 - 5.1 mmol/L   Chloride 105 98 - 111 mmol/L   CO2 29 22 - 32 mmol/L   Glucose, Bld 171 (H) 70 - 99 mg/dL   BUN 13 8 - 23 mg/dL   Creatinine, Ser 9.13 0.61 - 1.24 mg/dL   Calcium  8.8 (L) 8.9 - 10.3 mg/dL   GFR, Estimated >39 >39 mL/min   Anion gap 11 5 - 15  Albumin      Status: Abnormal   Collection Time: 01/23/24  3:26 AM  Result Value Ref Range   Albumin  1.9 (L) 3.5 - 5.0 g/dL  I-STAT 7, (LYTES, BLD GAS, ICA, H+H)     Status: Abnormal   Collection Time: 01/23/24  5:27 AM  Result Value Ref Range   pH, Arterial 7.362 7.35 - 7.45   pCO2 arterial 49.5 (H) 32 - 48 mmHg   pO2, Arterial 244 (H) 83 - 108 mmHg   Bicarbonate 28.1 (H) 20.0 - 28.0 mmol/L  TCO2 30 22 - 32 mmol/L   O2 Saturation 100 %   Acid-Base Excess 2.0 0.0 - 2.0 mmol/L   Sodium 144 135 - 145 mmol/L   Potassium 3.7 3.5 - 5.1 mmol/L    Calcium , Ion 1.38 1.15 - 1.40 mmol/L   HCT 32.0 (L) 39.0 - 52.0 %   Hemoglobin 10.9 (L) 13.0 - 17.0 g/dL   Patient temperature 01.3 F    Collection site RADIAL, ALLEN'S TEST ACCEPTABLE    Drawn by RT    Sample type ARTERIAL   Glucose, capillary     Status: Abnormal   Collection Time: 01/23/24  7:27 AM  Result Value Ref Range   Glucose-Capillary 120 (H) 70 - 99 mg/dL    Assessment & Plan: Present on Admission:  Adenocarcinoma of gastric cardia (HCC)    LOS: 8 days   Additional comments:I reviewed the patient's new clinical lab test results. And CXR POD#8 S/P total gastrectomy, esophagectomy and roux-en-Y esophagojejunostomy, POD#7 s/p takeback by thoracic surgery for RATS repair of anastomotic leak  - seems to have ongoing leak, to OR today with Dr. Shyrl for stent placement  Acute hypoxic ventilator dependent respiratory failure - I turned FiO2 down to 50%, PEEP 8 - ABG at 1200 - full support  ID/Sepsis from anastamotic leak - Zosyn  and diflucan  -not req pressors at this point  AF RVR - dilt drip per Cardiology  T2DM - SSI  FEN  - strict NPO  VTE - LMWH - full anticoag on hold  Dispo - ICU. OR  Critical Care Total Time*: 35 Minutes  Dann Hummer, MD, MPH, FACS Trauma & General Surgery Use [email or web address] to contact on call provider  01/23/2024  *Care during the described time interval was provided by me. I have reviewed this patient's available data, including medical history, events of note, physical examination and test results as part of my evaluation.

## 2024-01-23 NOTE — Plan of Care (Signed)
  Problem: Fluid Volume: Goal: Ability to maintain a balanced intake and output will improve Outcome: Progressing   Problem: Metabolic: Goal: Ability to maintain appropriate glucose levels will improve Outcome: Progressing   Problem: Nutritional: Goal: Maintenance of adequate nutrition will improve Outcome: Progressing   Problem: Tissue Perfusion: Goal: Adequacy of tissue perfusion will improve Outcome: Progressing

## 2024-01-23 NOTE — TOC CAGE-AID Note (Signed)
 Transition of Care [facility]) - CAGE-AID Screening   Patient Details  Name: [name] MRN: [id number] Date of Birth: 1955/06/21  [facility]) CM/SW Contact:    Ceyda Peterka E Kieth Hartis, LCSW Phone Number: 01/23/2024, 9:12 AM   Clinical Narrative:    CAGE-AID Screening: Substance Abuse Screening unable to be completed due to: : Patient unable to participate

## 2024-01-23 NOTE — Anesthesia Preprocedure Evaluation (Signed)
 Anesthesia Evaluation  Patient identified by MRN, date of birth, ID band Patient awake    Reviewed: Allergy & Precautions, H&P , NPO status , Patient's Chart, lab work & pertinent test results  History of Anesthesia Complications Negative for: history of anesthetic complications  Airway Mallampati: Intubated   Neck ROM: full    Dental  (+) Poor Dentition, Dental Advisory Given   Pulmonary asthma , COPD, Current Smoker and Patient abstained from smoking.   breath sounds clear to auscultation       Cardiovascular hypertension, + dysrhythmias Atrial Fibrillation  Rhythm:regular Rate:Normal     Neuro/Psych  Headaches PSYCHIATRIC DISORDERS  Depression     Neuromuscular disease    GI/Hepatic ,GERD  ,,(+) Hepatitis -, CGastric CA  Anastomotic leak   Endo/Other  diabetes, Type 2    Renal/GU      Musculoskeletal  (+) Arthritis ,    Abdominal   Peds  Hematology   Anesthesia Other Findings   Reproductive/Obstetrics                              Anesthesia Physical Anesthesia Plan  ASA: 3  Anesthesia Plan: General   Post-op Pain Management:    Induction: Intravenous  PONV Risk Score and Plan: 1 and Ondansetron  and Treatment may vary due to age or medical condition  Airway Management Planned: Oral ETT  Additional Equipment:   Intra-op Plan:   Post-operative Plan: Extubation in OR  Informed Consent: I have reviewed the patients History and Physical, chart, labs and discussed the procedure including the risks, benefits and alternatives for the proposed anesthesia with the patient or authorized representative who has indicated his/her understanding and acceptance.     Dental advisory given  Plan Discussed with: CRNA, Anesthesiologist and Surgeon  Anesthesia Plan Comments:          Anesthesia Quick Evaluation

## 2024-01-23 NOTE — Anesthesia Postprocedure Evaluation (Signed)
 Anesthesia Post Note  Patient: [name]  Procedure(s) Performed: EGD (ESOPHAGOGASTRODUODENOSCOPY) INSERTION, STENT, ESOPHAGUS (Esophagus)     Anesthesia Type: General Anesthetic complications: no   No notable events documented.  Last Vitals:  Vitals:   01/23/24 1545 01/23/24 1600  BP: 123/72 122/72  Pulse: 66 64  Resp: 17 (!) 22  Temp:    SpO2: 100% 100%    Last Pain:  Vitals:   01/23/24 1534  TempSrc: Axillary  PainSc:                  [name]

## 2024-01-23 NOTE — Progress Notes (Signed)
 Nutrition Follow-up  DOCUMENTATION CODES:   Severe malnutrition in context of chronic illness  INTERVENTION:   After OR resume  Vital 1.5 @ 60 ml/hr via J-tube 60 ml ProSource TF20 BID  100 ml free water  every 6 hours  Provides: 2300 kcal, 137 grams protein, and 1500 ml free water .   Continue to monitor for refeeding   NUTRITION DIAGNOSIS:   Severe Malnutrition related to cancer and cancer related treatments as evidenced by severe muscle depletion, moderate fat depletion, energy intake < or equal to 75% for > or equal to 1 month, percent weight loss (has lost 28 lbs, 15% in 6 months). Ongoing.   GOAL:   Patient will meet greater than or equal to 90% of their needs Not met, TF held for procedure   MONITOR:   Diet advancement, TF tolerance, Labs, I & O's, Skin  REASON FOR ASSESSMENT:   Consult Assessment of nutrition requirement/status, Enteral/tube feeding initiation and management  ASSESSMENT:   [name] is a 68 yo male who was diagnosed with uT2N0 adenocarcinoma of the gastric cardia earlier this year. EUS showed evidence of invasion into the GE junction. The patient completed 3 cycles neoadjuvant FLOT with some complications, and was not able to tolerate further chemotherapy. Presented to [facility] for surgery now post op total gastrectomy with distal esophagectomy, roux-en-Y esophagojejunostomy, J-tube. PMH of T2DM, GERD, HTN, a-fib, hernia repair.  Pt discussed during ICU rounds and with RN and MD.  Pt intubated overnight.  Pt going back to OR for stent due to ongoing anastomotic leak.   9/3 - total gastrectomy with distal esophagectomy, roux-en-Y esophagojejunostomy, J-tube, chest tube, NGT placed to lIS  9/4 - s/p Thoracoscopy repair of esophageal leak using Myriad Matrix  9/6 - transferred to ICU for hypoxia and increased o2 requirements 9/7 - Trickle tube feeds started, 20 ml/hr 9/8 - Tube feeds advanced to 30 ml/hr, transferred to progressive  9/9 - Tube  feeds advanced to 40 ml/hr increase by 10 ml every 6 hours until goal of 60 ml/hr 9/10 - TF held 9/11 - Back to OR for leak   Medications reviewed and include:  SSI every 4 hours, liquid MVI, thiamine   Fentanyl   LR @ 125 ml/hr 10 mEq KCl x 4  Propofol  @ 9 ml provides: 237 kcal    Labs reviewed:  K 3.0 -> 3.7  Phos 1.6 -> 7.3 CBG: 120-148  Drains:  1 R chest JP: 150 ml 1 R abd JP: 55 ml 14 F j-tube CT: 456 ml   NUTRITION - FOCUSED PHYSICAL EXAM:  Flowsheet Row Most Recent Value  Orbital Region Severe depletion  Upper Arm Region Moderate depletion  Thoracic and Lumbar Region Severe depletion  Buccal Region Unable to assess  Temple Region Severe depletion  Clavicle Bone Region Severe depletion  Clavicle and Acromion Bone Region Severe depletion  Scapular Bone Region Severe depletion  Dorsal Hand Unable to assess  Patellar Region Severe depletion  Anterior Thigh Region Severe depletion  Posterior Calf Region Severe depletion  Edema (RD Assessment) None  Hair Reviewed  Eyes Reviewed  Mouth Unable to assess  Skin Reviewed  Nails Reviewed    Diet Order:   Diet Order             Diet NPO time specified  Diet effective now                   EDUCATION NEEDS:   Education needs have been addressed  Skin:  Skin Assessment: Skin Integrity Issues: Skin Integrity Issues:: Incisions Incisions: Closed surgical incision, abdomen  Last BM:  9/11 x 1 (medium and large)  Height:   Ht Readings from Last 1 Encounters:  01/16/24 5' 11 (1.803 m)    Weight:   Wt Readings from Last 1 Encounters:  01/23/24 76.9 kg    Ideal Body Weight:  78.2 kg  BMI:  Body mass index is 23.65 kg/m.  Estimated Nutritional Needs:   Kcal:  2300-2500 kcal  Protein:  120-140 gm  Fluid:  >/=2L/day  Jon Flam P., RD, LDN, CNSC See AMiON for contact information

## 2024-01-23 NOTE — Progress Notes (Signed)
 Patient ID: [name], male   DOB: [birth year], 68 y.o.   MRN: [id number] Back from procedure. ABG done - will increase FiO2 and RR. Albumin  bolus for soft BP.  Dann Hummer, MD, MPH, FACS Please use [email or web address] to contact on call provider

## 2024-01-23 NOTE — Progress Notes (Signed)
 While on break this RN was notified that patient was having increase WOB, using abdominal muscles, tachypnea, desating into the 80s and requiring additional O2. Neuro status change to patient now only making grunting noise to pain when asking orientation questions. Pt was able to wiggle toes on command.  Rapid RN, respiratory and Ann MD came to bedside.   Orders to transfer patient 4N-ICU.  All belongings including a cellphone was transported with patient.

## 2024-01-23 NOTE — Transfer of Care (Signed)
 Immediate Anesthesia Transfer of Care Note  Patient: [name]  Procedure(s) Performed: EGD (ESOPHAGOGASTRODUODENOSCOPY) INSERTION, STENT, ESOPHAGUS (Esophagus)  Patient Location:  ICU  Anesthesia Type:General  Level of Consciousness: sedated and Patient remains intubated per anesthesia plan  Airway & Oxygen Therapy: Patient remains intubated per anesthesia plan and Patient placed on Ventilator (see vital sign flow sheet for setting)  Post-op Assessment: Report given to RN and Post -op Vital signs reviewed and stable  Post vital signs: Reviewed and stable  Last Vitals:  Vitals Value Taken Time  BP 118/76 13:33  Temp 35.7 13:33  Pulse 78 13:33  Resp vented 13:33  SpO2 98 13:33    Last Pain:  Vitals:   01/23/24 1151  TempSrc: Axillary  PainSc:       Patients Stated Pain Goal: 0 (01/17/24 0735)  Complications: No notable events documented.

## 2024-01-23 NOTE — Progress Notes (Signed)
 PT Cancellation Note  Patient Details Name: Canaan Holzer MRN: [id number] DOB: Dec 15, 1955   Cancelled Treatment:    Reason Eval/Treat Not Completed: Patient at procedure or test/unavailable  Patient off the unit with return to OR. Will continue to follow and resume activity as appropriate.    [name], PT Acute Rehabilitation Services  Office [phone]   [name] 01/23/2024, 12:51 PM

## 2024-01-24 ENCOUNTER — Encounter (HOSPITAL_COMMUNITY): Payer: Self-pay | Admitting: Thoracic Surgery (Cardiothoracic Vascular Surgery)

## 2024-01-24 ENCOUNTER — Encounter: Payer: Self-pay | Admitting: Hematology

## 2024-01-24 ENCOUNTER — Inpatient Hospital Stay (HOSPITAL_COMMUNITY)

## 2024-01-24 LAB — GLUCOSE, CAPILLARY
Glucose-Capillary: 125 mg/dL — ABNORMAL HIGH (ref 70–99)
Glucose-Capillary: 142 mg/dL — ABNORMAL HIGH (ref 70–99)
Glucose-Capillary: 145 mg/dL — ABNORMAL HIGH (ref 70–99)
Glucose-Capillary: 159 mg/dL — ABNORMAL HIGH (ref 70–99)
Glucose-Capillary: 161 mg/dL — ABNORMAL HIGH (ref 70–99)
Glucose-Capillary: 177 mg/dL — ABNORMAL HIGH (ref 70–99)

## 2024-01-24 LAB — BASIC METABOLIC PANEL WITH GFR
Anion gap: 10 (ref 5–15)
BUN: 21 mg/dL (ref 8–23)
CO2: 27 mmol/L (ref 22–32)
Calcium: 8.5 mg/dL — ABNORMAL LOW (ref 8.9–10.3)
Chloride: 109 mmol/L (ref 98–111)
Creatinine, Ser: 0.93 mg/dL (ref 0.61–1.24)
GFR, Estimated: >60 mL/min (ref >60)
Glucose, Bld: 173 mg/dL — ABNORMAL HIGH (ref 70–99)
Potassium: 3.3 mmol/L — ABNORMAL LOW (ref 3.5–5.1)
Sodium: 146 mmol/L — ABNORMAL HIGH (ref 135–145)

## 2024-01-24 LAB — CBC
HCT: 29.1 % — ABNORMAL LOW (ref 39.0–52.0)
Hemoglobin: 9.3 g/dL — ABNORMAL LOW (ref 13.0–17.0)
MCH: 32.9 pg (ref 26.0–34.0)
MCHC: 32 g/dL (ref 30.0–36.0)
MCV: 102.8 fL — ABNORMAL HIGH (ref 80.0–100.0)
Platelets: 385 K/uL (ref 150–400)
RBC: 2.83 MIL/uL — ABNORMAL LOW (ref 4.22–5.81)
RDW: 13.6 % (ref 11.5–15.5)
WBC: 21.1 K/uL — ABNORMAL HIGH (ref 4.0–10.5)
nRBC: 0 % (ref 0.0–0.2)

## 2024-01-24 LAB — PHOSPHORUS: Phosphorus: 3.7 mg/dL (ref 2.5–4.6)

## 2024-01-24 LAB — TRIGLYCERIDES: Triglycerides: 148 mg/dL (ref <150)

## 2024-01-24 MED ORDER — IBUPROFEN 100 MG/5ML PO SUSP
600.0000 mg | Freq: Four times a day (QID) | ORAL | Status: DC
  Administered 2024-01-24 – 2024-01-28 (×15): 600 mg
  Filled 2024-01-24 (×16): qty 30

## 2024-01-24 MED ORDER — ACETAMINOPHEN 160 MG/5ML PO SOLN
1000.0000 mg | Freq: Four times a day (QID) | ORAL | Status: DC
  Administered 2024-01-24 – 2024-02-01 (×29): 1000 mg via JEJUNOSTOMY
  Filled 2024-01-24 (×32): qty 40.6

## 2024-01-24 MED ORDER — DILTIAZEM 12 MG/ML ORAL SUSPENSION
30.0000 mg | Freq: Four times a day (QID) | ORAL | Status: DC
  Administered 2024-01-24 – 2024-02-01 (×33): 30 mg
  Filled 2024-01-24 (×36): qty 2.5

## 2024-01-24 MED ORDER — POTASSIUM CHLORIDE 10 MEQ/100ML IV SOLN
10.0000 meq | INTRAVENOUS | Status: AC
  Administered 2024-01-24 (×4): 10 meq via INTRAVENOUS
  Filled 2024-01-24 (×4): qty 100

## 2024-01-24 MED ORDER — MORPHINE SULFATE (PF) 2 MG/ML IV SOLN
2.0000 mg | INTRAVENOUS | Status: DC | PRN
  Administered 2024-01-24: 4 mg via INTRAVENOUS
  Administered 2024-01-27: 2 mg via INTRAVENOUS
  Administered 2024-01-28: 4 mg via INTRAVENOUS
  Administered 2024-01-28: 2 mg via INTRAVENOUS
  Filled 2024-01-24: qty 2
  Filled 2024-01-24 (×2): qty 1
  Filled 2024-01-24: qty 2

## 2024-01-24 MED ORDER — OXYCODONE HCL 5 MG/5ML PO SOLN
10.0000 mg | ORAL | Status: DC
  Administered 2024-01-24 – 2024-01-27 (×16): 10 mg via JEJUNOSTOMY
  Filled 2024-01-24 (×16): qty 10

## 2024-01-24 MED ORDER — OXYCODONE HCL 5 MG/5ML PO SOLN
5.0000 mg | ORAL | Status: DC | PRN
  Administered 2024-01-24 – 2024-01-25 (×3): 10 mg
  Administered 2024-01-26: 5 mg
  Administered 2024-01-26: 10 mg
  Filled 2024-01-24 (×2): qty 10
  Filled 2024-01-24 (×3): qty 5
  Filled 2024-01-24: qty 10

## 2024-01-24 NOTE — Progress Notes (Signed)
 Physical Therapy Treatment Patient Details Name: [name] MRN: [id number] DOB: 04-25-1956 Today's Date: 01/24/2024   History of Present Illness 68 y.o. male presents to [facility] on 01/15/2024 with gastric CA. Pt underwent total gastrectomy with omentectomy and D2 lymphadenectomy, esophagojejunostomy with Jtube placement, splenic biopsy on 9/3. Pt developed bilious output into chest tube on 9/4, returned to OR for EGD and thoracoscopy with application of wound matrix to EJ anastomosis. ETT 9/11-9/12 for desats and unresponsiveness. S/p EGD and esophageal stent placement 9/11. PMH includes asthma, OA, COPD, GERD, DMII, MDD, emphysema.    PT Comments  Pt endorsing pain everywhere, especially abdomen and back today. Pt A&Ox3, requires mod encouragement to participate given severe pain. Pt tolerating bed mobility, EOB sitting, LE exercises at EOB, and transition into stand x1 with very limited tolerance. VSS on RA 89-97%, placed back on 4LO2 at end of session.  Pt endorsing wanting to d/c home ASAP, may need to consider post-acute rehab since he lives at home alone. Will continue to progress as able.     If plan is discharge home, recommend the following: Assistance with cooking/housework;Assist for transportation;Help with stairs or ramp for entrance;Two people to help with walking and/or transfers;Direct supervision/assist for medications management;Direct supervision/assist for financial management;Supervision due to cognitive status;Two people to help with bathing/dressing/bathroom   Can travel by private vehicle        Equipment Recommendations  Other (comment) (tbd)    Recommendations for Other Services       Precautions / Restrictions Precautions Precautions: Fall Recall of Precautions/Restrictions: Impaired Precaution/Restrictions Comments: R chest tube to water  seal, R JP drain x 2, J tube, NG tube, 4LO2 Restrictions Weight Bearing Restrictions Per Provider Order: No      Mobility  Bed Mobility Overal bed mobility: Needs Assistance Bed Mobility: Supine to Sit, Sit to Supine     Supine to sit: Max assist, +2 for physical assistance Sit to supine: Max assist, +2 for physical assistance   General bed mobility comments: assist for trunk and LE management, PT using bed pads to facilitate trunk and LE translation to EOB.    Transfers Overall transfer level: Needs assistance Equipment used: 2 person hand held assist Transfers: Sit to/from Stand Sit to Stand: +2 physical assistance, +2 safety/equipment, Mod assist           General transfer comment: assist +2 fpr power up, rise, steadying. Pt assuming flexed truncal posture given abdominal pain.    Ambulation/Gait               General Gait Details: nt   Comptroller Bed    Modified Rankin (Stroke Patients Only)       Balance Overall balance assessment: Needs assistance Sitting-balance support: Feet supported, Bilateral upper extremity supported Sitting balance-Leahy Scale: Fair Sitting balance - Comments: can sit EOB with supervision once balance is gained with posterior truncal support   Standing balance support: Bilateral upper extremity supported, During functional activity Standing balance-Leahy Scale: Poor                              Communication Communication Communication: Impaired Factors Affecting Communication: Reduced clarity of speech  Cognition Arousal: Alert Behavior During Therapy: Flat affect   PT - Cognitive impairments: Orientation, Sequencing  PT - Cognition Comments: pt oriented to self and location, PT oriented pt to all surgeries since admission as pt did not know about yesterday's stenting. Pt with dry sense of humor Following commands: Impaired Following commands impaired: Follows one step commands with increased time    Cueing Cueing Techniques: Verbal  cues, Tactile cues  Exercises      General Comments General comments (skin integrity, edema, etc.): on 4LO2 upon arrival, removed during session. SPO2 89-97% on RA, placed back on 4LO2 at end of session. All lines/drains checked pre and post-session, all WFL.      Pertinent Vitals/Pain Pain Assessment Pain Assessment: Faces Faces Pain Scale: Hurts even more Pain Location: abdomen, everywhere Pain Descriptors / Indicators: Sore, Discomfort, Grimacing, Guarding Pain Intervention(s): Limited activity within patient's tolerance, Monitored during session, Repositioned    Home Living                          Prior Function            PT Goals (current goals can now be found in the care plan section) Acute Rehab PT Goals Patient Stated Goal: to return to independence PT Goal Formulation: With patient Time For Goal Achievement: 01/31/24 Potential to Achieve Goals: Fair Progress towards PT goals: Progressing toward goals    Frequency    Min 3X/week      PT Plan      Co-evaluation              AM-PAC PT 6 Clicks Mobility   Outcome Measure  Help needed turning from your back to your side while in a flat bed without using bedrails?: A Lot Help needed moving from lying on your back to sitting on the side of a flat bed without using bedrails?: A Lot Help needed moving to and from a bed to a chair (including a wheelchair)?: Total Help needed standing up from a chair using your arms (e.g., wheelchair or bedside chair)?: Total Help needed to walk in hospital room?: Total Help needed climbing 3-5 steps with a railing? : Total 6 Click Score: 8    End of Session Equipment Utilized During Treatment: Oxygen Activity Tolerance: Treatment limited secondary to medical complications (Comment);Patient limited by pain Patient left: in bed;with call bell/phone within reach;with bed alarm set;with nursing/sitter in room Nurse Communication: Mobility status PT Visit  Diagnosis: Other abnormalities of gait and mobility (R26.89);Muscle weakness (generalized) (M62.81);Pain Pain - part of body:  (abd)     Time: 8555-8484 PT Time Calculation (min) (ACUTE ONLY): 31 min  Charges:    $Therapeutic Activity: 23-37 mins PT General Charges $$ ACUTE PT VISIT: 1 Visit                     [name], PT DPT Acute Rehabilitation Services Secure Chat Preferred  Office [phone]    Mitra Duling E Johna 01/24/2024, 4:10 PM

## 2024-01-24 NOTE — Progress Notes (Signed)
 Patient extubated per order to 4L [place] with no apparent complications. Positive cuff leak was noted prior to extubation. Patient is alert, has strong cough, and is able to speak. Vitals are stable.

## 2024-01-24 NOTE — Progress Notes (Addendum)
 1 Day Post-Op  Subjective: Esophageal stent placed yesterday. Patient is afebrile and hemodynamically stable, no pressor requirements, remains in sinus rhythm. Alert this morning with minimal sedation. WBC downtrending to 21.   Objective: Vital signs in last 24 hours: Temp:  [98.2 F (36.8 C)-99.3 F (37.4 C)] 99.3 F (37.4 C) (09/12 0755) Pulse Rate:  [62-94] 78 (09/12 0800) Resp:  [15-28] 20 (09/12 0800) BP: (87-148)/(60-83) 132/72 (09/12 0800) SpO2:  [93 %-100 %] 100 % (09/12 0800) FiO2 (%):  [40 %-60 %] 40 % (09/12 0807) Weight:  [80.1 kg] 80.1 kg (09/12 0433) Last BM Date : 01/24/24  Intake/Output from previous day: 09/11 0701 - 09/12 0700 In: 4674.7 [I.V.:2922.8; NG/GT:805.5; IV Piggyback:726.4] Out: 1601 [Urine:770; Emesis/NG output:400; Drains:157; Chest Tube:274] Intake/Output this shift: Total I/O In: 352.5 [I.V.:293; NG/GT:59.5] Out: 100 [Urine:100]  PE: General: resting in bed, sedated Neuro: minimal sedation, opens eyes spontaneously and follows commands. GCS 11T. HEENT: NG in place with dark brown fluid, no blood noted  Resp: intubated, on vent, PSV. R chest tube with turbid serous fluid. R pleural JP with cloudy yellow fluid.  CV: RRR Abdomen: very soft and nondistended. Upper midline incision clean and dry. RUQ JP with serosanguinous drainage. J tube with feeds running at 60 ml/hr.    Lab Results:  Recent Labs    01/23/24 0326 01/23/24 0527 01/23/24 1416 01/24/24 0428  WBC 34.2*  --   --  21.1*  HGB 12.1*   < > 10.5* 9.3*  HCT 38.0*   < > 31.0* 29.1*  PLT 472*  --   --  385   < > = values in this interval not displayed.   BMET Recent Labs    01/23/24 0326 01/23/24 0527 01/23/24 1416 01/24/24 0428  NA 145   < > 144 146*  K 3.6   < > 3.5 3.3*  CL 105  --   --  109  CO2 29  --   --  27  GLUCOSE 171*  --   --  173*  BUN 13  --   --  21  CREATININE 0.86  --   --  0.93  CALCIUM  8.8*  --   --  8.5*   < > = values in this interval not  displayed.   PT/INR No results for input(s): LABPROT, INR in the last 72 hours. CMP     Component Value Date/Time   NA 146 (H) 01/24/2024 0428   K 3.3 (L) 01/24/2024 0428   CL 109 01/24/2024 0428   CO2 27 01/24/2024 0428   GLUCOSE 173 (H) 01/24/2024 0428   BUN 21 01/24/2024 0428   CREATININE 0.93 01/24/2024 0428   CREATININE 0.85 10/10/2023 0824   CALCIUM  8.5 (L) 01/24/2024 0428   PROT 6.7 01/10/2024 1130   ALBUMIN  1.9 (L) 01/23/2024 0326   AST 17 01/10/2024 1130   AST 15 10/10/2023 0824   ALT 11 01/10/2024 1130   ALT 11 10/10/2023 0824   ALKPHOS 32 (L) 01/10/2024 1130   BILITOT 0.6 01/10/2024 1130   BILITOT 0.5 10/10/2023 0824   GFRNONAA >60 01/24/2024 0428   GFRNONAA >60 10/10/2023 0824   GFRAA  10/16/2007 1246    >60        The eGFR has been calculated using the MDRD equation. This calculation has not been validated in all clinical   Lipase  No results found for: LIPASE   Assessment/Plan  68 yo male with gastric cardia adenocarcinoma, POD9 s/p total  gastrectomy, with distal esophagectomy and roux-en-Y esophagojejunostomy on 9/3. POD8 s/p takeback by thoracic surgery for RATS repair of anastomotic leak on 9/4. POD1 s/p EGD with placement of esophageal stent on 9/11. - Vent-dependent respiratory failure: Weaning this morning, tolerating PSV. Will try weaning to extubate, discussed with Dr. Paola. - EJ anastomotic leak: S/p esophageal stent placement. Continue broad-spectrum antibiotics (Zosyn  and fluconazole ). Strict NPO, NG to LIS, drains in place. - Pain/sedation: on fentanyl  and propofol  gtt with oxycodone  via J tube. Weaning sedation for hopeful extubation. - T2DM: on sliding scale novolog  - A-fib with RVR: Converted to sinus rhythm 9/9, remains in sinus. On dilt gtt, discussed with cardiology and will transition to PO diltiazem  via J tube. On scheduled metoprolol  via J tube. - FEN: strict NPO, NG tube to LIS, continue J tube feeds at goal. Given albumin   boluses yesterday for soft BP, BP improved today, will stop IVF. - VTE: prophylactic lovenox , therapeutic anticoagulation (for a-fib) on hold for now.  - Dispo: ICU  I updated patient's sister [name] Idell Eagles via phone on the patient's clinical status and plan of care for the day.   LOS: 9 days    Jon Dawn, MD [facility] Surgery General, Hepatobiliary and Pancreatic Surgery 01/24/24 8:38 AM

## 2024-01-24 NOTE — Progress Notes (Signed)
 Trauma/Critical Care Follow Up Note  Subjective:    Overnight Issues:   Objective:  Vital signs for last 24 hours: Temp:  [98.2 F (36.8 C)-99.3 F (37.4 C)] 99.3 F (37.4 C) (09/12 0755) Pulse Rate:  [62-94] 78 (09/12 0800) Resp:  [15-27] 20 (09/12 0800) BP: (87-141)/(60-83) 132/72 (09/12 0800) SpO2:  [93 %-100 %] 100 % (09/12 0800) FiO2 (%):  [40 %-60 %] 40 % (09/12 0807) Weight:  [80.1 kg] 80.1 kg (09/12 0433)  Hemodynamic parameters for last 24 hours:    Intake/Output from previous day: 09/11 0701 - 09/12 0700 In: 4674.7 [I.V.:2922.8; NG/GT:805.5; IV Piggyback:726.4] Out: 1601 [Urine:770; Emesis/NG output:400; Drains:157; Chest Tube:274]  Intake/Output this shift: Total I/O In: 352.5 [I.V.:293; NG/GT:59.5] Out: 100 [Urine:100]  Vent settings for last 24 hours: Vent Mode: PRVC FiO2 (%):  [40 %-60 %] 40 % Set Rate:  [21 bmp-22 bmp] 21 bmp Vt Set:  [580 mL-581 mL] 580 mL PEEP:  [8 cmH20] 8 cmH20 Plateau Pressure:  [19 cmH20-20 cmH20] 19 cmH20  Physical Exam:  Gen: comfortable, no distress Neuro: follows commands HEENT: PERRL Neck: supple CV: RRR Pulm: unlabored breathing on mechanical ventilation-pressure support Abd: soft, NT, incision clean, dry, intact, JP purulent, no recent BM GU: urine clear and yellow, +Foley Extr: wwp, no edema  Results for orders placed or performed during the hospital encounter of 01/15/24 (from the past 24 hours)  Glucose, capillary     Status: Abnormal   Collection Time: 01/23/24 11:29 AM  Result Value Ref Range   Glucose-Capillary 148 (H) 70 - 99 mg/dL  I-STAT 7, (LYTES, BLD GAS, ICA, H+H)     Status: Abnormal   Collection Time: 01/23/24  2:16 PM  Result Value Ref Range   pH, Arterial 7.362 7.35 - 7.45   pCO2 arterial 46.0 32 - 48 mmHg   pO2, Arterial 59 (L) 83 - 108 mmHg   Bicarbonate 26.0 20.0 - 28.0 mmol/L   TCO2 27 22 - 32 mmol/L   O2 Saturation 89 %   Acid-Base Excess 0.0 0.0 - 2.0 mmol/L   Sodium 144 135 - 145  mmol/L   Potassium 3.5 3.5 - 5.1 mmol/L   Calcium , Ion 1.29 1.15 - 1.40 mmol/L   HCT 31.0 (L) 39.0 - 52.0 %   Hemoglobin 10.5 (L) 13.0 - 17.0 g/dL   Patient temperature 00.9 F    Collection site RADIAL, ALLEN'S TEST ACCEPTABLE    Drawn by RT    Sample type ARTERIAL   Glucose, capillary     Status: Abnormal   Collection Time: 01/23/24  3:12 PM  Result Value Ref Range   Glucose-Capillary 134 (H) 70 - 99 mg/dL  Glucose, capillary     Status: Abnormal   Collection Time: 01/23/24  7:49 PM  Result Value Ref Range   Glucose-Capillary 155 (H) 70 - 99 mg/dL  Glucose, capillary     Status: Abnormal   Collection Time: 01/23/24 11:47 PM  Result Value Ref Range   Glucose-Capillary 155 (H) 70 - 99 mg/dL  Glucose, capillary     Status: Abnormal   Collection Time: 01/24/24  3:52 AM  Result Value Ref Range   Glucose-Capillary 159 (H) 70 - 99 mg/dL  Phosphorus     Status: None   Collection Time: 01/24/24  4:28 AM  Result Value Ref Range   Phosphorus 3.7 2.5 - 4.6 mg/dL  Triglycerides     Status: None   Collection Time: 01/24/24  4:28 AM  Result Value Ref  Range   Triglycerides 148 <150 mg/dL  CBC     Status: Abnormal   Collection Time: 01/24/24  4:28 AM  Result Value Ref Range   WBC 21.1 (H) 4.0 - 10.5 K/uL   RBC 2.83 (L) 4.22 - 5.81 MIL/uL   Hemoglobin 9.3 (L) 13.0 - 17.0 g/dL   HCT 70.8 (L) 60.9 - 47.9 %   MCV 102.8 (H) 80.0 - 100.0 fL   MCH 32.9 26.0 - 34.0 pg   MCHC 32.0 30.0 - 36.0 g/dL   RDW 86.3 88.4 - 84.4 %   Platelets 385 150 - 400 K/uL   nRBC 0.0 0.0 - 0.2 %  Basic metabolic panel with GFR     Status: Abnormal   Collection Time: 01/24/24  4:28 AM  Result Value Ref Range   Sodium 146 (H) 135 - 145 mmol/L   Potassium 3.3 (L) 3.5 - 5.1 mmol/L   Chloride 109 98 - 111 mmol/L   CO2 27 22 - 32 mmol/L   Glucose, Bld 173 (H) 70 - 99 mg/dL   BUN 21 8 - 23 mg/dL   Creatinine, Ser 9.06 0.61 - 1.24 mg/dL   Calcium  8.5 (L) 8.9 - 10.3 mg/dL   GFR, Estimated >39 >39 mL/min   Anion  gap 10 5 - 15  Glucose, capillary     Status: Abnormal   Collection Time: 01/24/24  7:28 AM  Result Value Ref Range   Glucose-Capillary 145 (H) 70 - 99 mg/dL    Assessment & Plan: The plan of care was discussed with the bedside nurse for the day, Merilee, who is in agreement with this plan and no additional concerns were raised.   Present on Admission:  Adenocarcinoma of gastric cardia (HCC)    LOS: 9 days   Additional comments:I reviewed the patient's new clinical lab test results.   and I reviewed the patients new imaging test results.    POD#9 S/P total gastrectomy, esophagectomy and roux-en-Y esophagojejunostomy, POD#8 s/p takeback by thoracic surgery for RATS repair of anastomotic leak, POD1 s/p esophageal stent placement by Dr. Shyrl  - drains purulent   Acute hypoxic ventilator dependent respiratory failure - extubate today   ID/Sepsis from anastamotic leak - Zosyn  and diflucan  - not req pressors at this point   AF RVR - dilt drip per Cardiology   T2DM - SSI   FEN  - strict NPO, no SLP eval for now, replete K   VTE - LMWH - full anticoag on hold   Dispo - ICU  Critical Care Total Time: 35 minutes  [name], MD Trauma & General Surgery Please use [email or web address] to contact on call provider  01/24/2024  *Care during the described time interval was provided by me. I have reviewed this patient's available data, including medical history, events of note, physical examination and test results as part of my evaluation.

## 2024-01-24 NOTE — Progress Notes (Addendum)
 [address] [employer] [address]             [phone]    9 Days Post-Op Esophagectomy  8 Days Post-Op ROBOT-ASSISTED THORACOSCOPY REPAIR OF ESOPHAGEAL LEAK USING MYRIAD MATRIX (N/A)    1 Day Post-Op EGD, Stenting EJ anastomosis   Subjective:  On vent,  lightly sedated with fentanly and propofol . Responds to questions appropriately.   TF at 3ml/hr Cardizem  infusion 5mg /hr.  Objective: Vital signs in last 24 hours: Temp:  [98.2 F (36.8 C)-99.3 F (37.4 C)] 99.3 F (37.4 C) (09/12 0755) Pulse Rate:  [62-94] 78 (09/12 0800) Cardiac Rhythm: Normal sinus rhythm (09/12 0800) Resp:  [15-27] 20 (09/12 0800) BP: (87-141)/(60-83) 132/72 (09/12 0800) SpO2:  [93 %-100 %] 100 % (09/12 0800) FiO2 (%):  [40 %-60 %] 40 % (09/12 0807) Weight:  [80.1 kg] 80.1 kg (09/12 0433)     Intake/Output from previous day: 09/11 0701 - 09/12 0700 In: 4674.7 [I.V.:2922.8; NG/GT:805.5; IV Piggyback:726.4] Out: 1601 [Urine:770; Emesis/NG output:400; Drains:157; Chest Tube:274] Intake/Output this shift: Total I/O In: 352.5 [I.V.:293; NG/GT:59.5] Out: 100 [Urine:100]  General appearance: on vent, alert, responds appropriately.   Heart: continues to maintain SR ~90-100/min. Lungs: breath sounds coarse. Oxygenating well, currently on fiO2 0.4. CXR stable with no significant effusions.  ABD: soft without obvious tenderness Wound: Clean and dry dressings around chest drains, chest tube had ~272ml turbid beige fluid past 24 hours.  Chest JP drain had ~51ml  turbid beige drainage  for past 24 hours.   EXTS: well perfused  Lab Results: Recent Labs    01/23/24 0326 01/23/24 0527 01/23/24 1416 01/24/24 0428  WBC 34.2*  --   --  21.1*  HGB 12.1*   < > 10.5* 9.3*  HCT 38.0*   < > 31.0* 29.1*  PLT 472*  --   --  385   < > = values in this interval not displayed.   BMET:  Recent Labs    01/23/24 0326 01/23/24 0527 01/23/24 1416 01/24/24 0428  NA 145   < > 144  146*  K 3.6   < > 3.5 3.3*  CL 105  --   --  109  CO2 29  --   --  27  GLUCOSE 171*  --   --  173*  BUN 13  --   --  21  CREATININE 0.86  --   --  0.93  CALCIUM  8.8*  --   --  8.5*   < > = values in this interval not displayed.    PT/INR: No results for input(s): LABPROT, INR in the last 72 hours. ABG    Component Value Date/Time   PHART 7.362 01/23/2024 1416   HCO3 26.0 01/23/2024 1416   TCO2 27 01/23/2024 1416   ACIDBASEDEF 2.0 01/15/2024 1459   O2SAT 89 01/23/2024 1416   CBG (last 3)  Recent Labs    01/23/24 2347 01/24/24 0352 01/24/24 0728  GLUCAP 155* 159* 145*    Assessment/Plan: S/P Procedure(s) (LRB): EGD (ESOPHAGOGASTRODUODENOSCOPY) (N/A) INSERTION, STENT, ESOPHAGUS (N/A)  CV: Continues to maintain SR on diltiazem  infusion and metoprolol  suspension per J-tube. K+ 3.3, IV supplement ordered.   Pulm: intubated early yesterday for resp distress.  Currently weaning vent support and tolerating well so far.    GI: J tube feeds at 44ml/hr, having bowel movements.    ID: POD1 EGD, stenting of EJ anastomotic leak.  Leukocytosis down  trending.  On empiric Zosyn  and Diflucan  for anastomotic leak at esophagojejunostomy.  Continue drainage of right pleural space with CT and JP.     LOS: 9 days    Myron G. Roddenberry, PA-C 01/24/2024   Drainage decreasing Continue CT to waterseal Continue abx Optimize nutrition  Saafir Abdullah MALVA Rayas

## 2024-01-25 ENCOUNTER — Inpatient Hospital Stay (HOSPITAL_COMMUNITY)

## 2024-01-25 LAB — PHOSPHORUS: Phosphorus: 3.3 mg/dL (ref 2.5–4.6)

## 2024-01-25 LAB — CBC
HCT: 30.7 % — ABNORMAL LOW (ref 39.0–52.0)
Hemoglobin: 9.8 g/dL — ABNORMAL LOW (ref 13.0–17.0)
MCH: 32.9 pg (ref 26.0–34.0)
MCHC: 31.9 g/dL (ref 30.0–36.0)
MCV: 103 fL — ABNORMAL HIGH (ref 80.0–100.0)
Platelets: 458 K/uL — ABNORMAL HIGH (ref 150–400)
RBC: 2.98 MIL/uL — ABNORMAL LOW (ref 4.22–5.81)
RDW: 13.7 % (ref 11.5–15.5)
WBC: 20.6 K/uL — ABNORMAL HIGH (ref 4.0–10.5)
nRBC: 0 % (ref 0.0–0.2)

## 2024-01-25 LAB — BASIC METABOLIC PANEL WITH GFR
Anion gap: 10 (ref 5–15)
BUN: 15 mg/dL (ref 8–23)
CO2: 26 mmol/L (ref 22–32)
Calcium: 8.2 mg/dL — ABNORMAL LOW (ref 8.9–10.3)
Chloride: 111 mmol/L (ref 98–111)
Creatinine, Ser: 0.84 mg/dL (ref 0.61–1.24)
GFR, Estimated: >60 mL/min (ref >60)
Glucose, Bld: 131 mg/dL — ABNORMAL HIGH (ref 70–99)
Potassium: 3.4 mmol/L — ABNORMAL LOW (ref 3.5–5.1)
Sodium: 147 mmol/L — ABNORMAL HIGH (ref 135–145)

## 2024-01-25 LAB — GLUCOSE, CAPILLARY
Glucose-Capillary: 128 mg/dL — ABNORMAL HIGH (ref 70–99)
Glucose-Capillary: 138 mg/dL — ABNORMAL HIGH (ref 70–99)
Glucose-Capillary: 149 mg/dL — ABNORMAL HIGH (ref 70–99)
Glucose-Capillary: 154 mg/dL — ABNORMAL HIGH (ref 70–99)
Glucose-Capillary: 168 mg/dL — ABNORMAL HIGH (ref 70–99)

## 2024-01-25 LAB — MAGNESIUM: Magnesium: 1.9 mg/dL (ref 1.7–2.4)

## 2024-01-25 MED ORDER — ORAL CARE MOUTH RINSE
15.0000 mL | OROMUCOSAL | Status: DC
Start: 2024-01-25 — End: 2024-02-02
  Administered 2024-01-25 – 2024-02-01 (×29): 15 mL via OROMUCOSAL

## 2024-01-25 MED ORDER — ORAL CARE MOUTH RINSE
15.0000 mL | OROMUCOSAL | Status: DC | PRN
  Administered 2024-02-02: 15 mL via OROMUCOSAL

## 2024-01-25 MED ORDER — SODIUM CHLORIDE 0.9 % IV SOLN: INTRAVENOUS | Status: AC | PRN

## 2024-01-25 NOTE — Progress Notes (Signed)
      [address] [place] [employer] [address]             [phone]    10 Days Post-Op Esophagectomy  9 Days Post-Op ROBOT-ASSISTED THORACOSCOPY REPAIR OF ESOPHAGEAL LEAK USING MYRIAD MATRIX (N/A)    2 Days Post-Op EGD, Stenting EJ anastomosis   Subjective:  Extubated, awake and cooperative.  No events past 24 hours.   TF at 52ml/hr Cardizem  transitioned to per tube  Objective: Vital signs in last 24 hours: Temp:  [98.4 F (36.9 C)-98.7 F (37.1 C)] 98.7 F (37.1 C) (09/13 0400) Pulse Rate:  [78-95] 84 (09/13 0500) Resp:  [12-26] 18 (09/13 0500) BP: (132-162)/(70-95) 157/91 (09/13 0500) SpO2:  [94 %-99 %] 99 % (09/13 0500) Weight:  [80.1 kg] 80.1 kg (09/13 0500)     Intake/Output from previous day: 09/12 0701 - 09/13 0700 In: 2033.3 [I.V.:525.7; NG/GT:759.5; IV Piggyback:648.1] Out: 4270 [Urine:1355; Emesis/NG output:100; Drains:205; Stool:2550; Chest Tube:60] Intake/Output this shift: No intake/output data recorded.  General appearance:  alert, responds appropriately.   Heart: continues to maintain SR ~90-100/min. Lungs: breath sounds clear anterior. Oxygenating well, on 2L [place] O2.   ABD: soft without tenderness Wound: having some leakage around the right chest tube. The chest tube had ~75ml turbid beige fluid past 24 hours.  Chest JP drain had ~116ml  turbid beige drainage  for past 24 hours.   EXTS: well perfused  Lab Results: Recent Labs    01/24/24 0428 01/25/24 0541  WBC 21.1* 20.6*  HGB 9.3* 9.8*  HCT 29.1* 30.7*  PLT 385 458*   BMET:  Recent Labs    01/24/24 0428 01/25/24 0541  NA 146* 147*  K 3.3* 3.4*  CL 109 111  CO2 27 26  GLUCOSE 173* 131*  BUN 21 15  CREATININE 0.93 0.84  CALCIUM  8.5* 8.2*    PT/INR: No results for input(s): LABPROT, INR in the last 72 hours. ABG    Component Value Date/Time   PHART 7.362 01/23/2024 1416   HCO3 26.0 01/23/2024 1416   TCO2 27 01/23/2024 1416   ACIDBASEDEF 2.0 01/15/2024  1459   O2SAT 89 01/23/2024 1416   CBG (last 3)  Recent Labs    01/24/24 2345 01/25/24 0344 01/25/24 0817  GLUCAP 142* 128* 154*    Assessment/Plan: S/P Procedure(s) (LRB): EGD (ESOPHAGOGASTRODUODENOSCOPY) (N/A) INSERTION, STENT, ESOPHAGUS (N/A)  CV: Continues to maintain SR on diltiazem  and metoprolol  suspension per J-tube. K+ 3.4.   Pulm:Extubated yesterday after requiring vent support for resp distress for about 24 hours.  Comfortable and oxygenating well on 2Lnc.    GI: J tube feeds at 55ml/hr, having frequent loose bowel movements as expected.    ID: POD2 EGD, stenting of EJ anastomotic leak.  Leukocytosis down trending.  On empiric Zosyn  and Diflucan  for anastomotic leak at esophagojejunostomy.  Continue drainage of right pleural space with CT and JP.     LOS: 10 days    Tishana Clinkenbeard G. Elmo Rio, PA-C 01/25/2024

## 2024-01-25 NOTE — Progress Notes (Signed)
 Trauma/Critical Care Follow Up Note  Subjective:    Overnight Issues:   Objective:  Vital signs for last 24 hours: Temp:  [98.4 F (36.9 C)-99.3 F (37.4 C)] 98.7 F (37.1 C) (09/13 0400) Pulse Rate:  [75-95] 84 (09/13 0500) Resp:  [12-26] 18 (09/13 0500) BP: (130-162)/(70-95) 157/91 (09/13 0500) SpO2:  [94 %-100 %] 99 % (09/13 0500) FiO2 (%):  [40 %-50 %] 40 % (09/12 0807) Weight:  [80.1 kg] 80.1 kg (09/13 0500)  Hemodynamic parameters for last 24 hours:    Intake/Output from previous day: 09/12 0701 - 09/13 0700 In: 2033.3 [I.V.:525.7; NG/GT:759.5; IV Piggyback:648.1] Out: 4270 [Urine:1355; Emesis/NG output:100; Drains:205; Stool:2550; Chest Tube:60]  Intake/Output this shift: Total I/O In: 300 [Other:100; NG/GT:200] Out: 2000 [Urine:600; Drains:50; Stool:1350]  Vent settings for last 24 hours: Vent Mode: PRVC FiO2 (%):  [40 %-50 %] 40 % Set Rate:  [21 bmp] 21 bmp Vt Set:  [580 mL] 580 mL PEEP:  [8 cmH20] 8 cmH20 Plateau Pressure:  [19 cmH20] 19 cmH20  Physical Exam:  Gen: comfortable, no distress Neuro: follows commands, alert, communicative HEENT: PERRL Neck: supple CV: RRR Pulm: unlabored breathing on [place] Abd: soft, NT, incision clean, dry, intact, JP purulent x1, SS x1, +BM GU: urine clear and yellow, +Foley Extr: wwp, no edema  Results for orders placed or performed during the hospital encounter of 01/15/24 (from the past 24 hours)  Glucose, capillary     Status: Abnormal   Collection Time: 01/24/24  7:28 AM  Result Value Ref Range   Glucose-Capillary 145 (H) 70 - 99 mg/dL  Glucose, capillary     Status: Abnormal   Collection Time: 01/24/24 11:16 AM  Result Value Ref Range   Glucose-Capillary 161 (H) 70 - 99 mg/dL  Glucose, capillary     Status: Abnormal   Collection Time: 01/24/24  3:25 PM  Result Value Ref Range   Glucose-Capillary 125 (H) 70 - 99 mg/dL  Glucose, capillary     Status: Abnormal   Collection Time: 01/24/24  7:54 PM  Result  Value Ref Range   Glucose-Capillary 177 (H) 70 - 99 mg/dL  Glucose, capillary     Status: Abnormal   Collection Time: 01/24/24 11:45 PM  Result Value Ref Range   Glucose-Capillary 142 (H) 70 - 99 mg/dL  Glucose, capillary     Status: Abnormal   Collection Time: 01/25/24  3:44 AM  Result Value Ref Range   Glucose-Capillary 128 (H) 70 - 99 mg/dL  CBC     Status: Abnormal   Collection Time: 01/25/24  5:41 AM  Result Value Ref Range   WBC 20.6 (H) 4.0 - 10.5 K/uL   RBC 2.98 (L) 4.22 - 5.81 MIL/uL   Hemoglobin 9.8 (L) 13.0 - 17.0 g/dL   HCT 69.2 (L) 60.9 - 47.9 %   MCV 103.0 (H) 80.0 - 100.0 fL   MCH 32.9 26.0 - 34.0 pg   MCHC 31.9 30.0 - 36.0 g/dL   RDW 86.2 88.4 - 84.4 %   Platelets 458 (H) 150 - 400 K/uL   nRBC 0.0 0.0 - 0.2 %    Assessment & Plan:  Present on Admission:  Adenocarcinoma of gastric cardia (HCC)    LOS: 10 days   POD#9 S/P total gastrectomy, esophagectomy and roux-en-Y esophagojejunostomy, POD#8 s/p takeback by thoracic surgery for RATS repair of anastomotic leak, POD1 s/p esophageal stent placement by Dr. Shyrl    Neuro - adequate pain control  CV - dilt gtt off,  transitioned to per tube admin  Pulm - extubated 9/12, doing well on [place], wean as tol - CXR today P  FEN/GI - strict NPO - GJ feeds, NGT for decompression. If accidentally removed, call MD, do not replace. - voluminous BM, avoiding anti-diarrheals as they are tablets and to avoid clogging GJ tube - today's labs P  GU - foley, d/c today  Heme/ID - zosyn /diflucan , duration per TCTS due to anastomotic leak  Endo - SSI  PPX - SCDs, LMWH - full AC on hold  LTDW - midline surgical site - JP x2 (one GS-compress bulb, one TCTS-do not compress bulb) - chest tube (TCTS) - NGT - foley (dc today) - rectal pouch  Dispo  - therapies rec SNF - patient states he wants to discharge and stay with his brother Arley, will need to obtain contact info to determine if this a viable  option   [name], MD Trauma & General Surgery Please use [email or web address] to contact on call provider  01/25/2024  *Care during the described time interval was provided by me. I have reviewed this patient's available data, including medical history, events of note, physical examination and test results as part of my evaluation.

## 2024-01-25 NOTE — Evaluation (Signed)
 Occupational Therapy Evaluation Patient Details Name: [name] MRN: [id number] DOB: 1956-03-18 Today's Date: 01/25/2024   History of Present Illness   68 y.o. male presents to [facility] hospital on 01/15/2024 with gastric CA. Pt underwent total gastrectomy with omentectomy and D2 lymphadenectomy, esophagojejunostomy with Jtube placement, splenic biopsy on 9/3. Pt developed bilious output into chest tube on 9/4, returned to OR for EGD and thoracoscopy with application of wound matrix to EJ anastomosis. ETT 9/11-9/12 for desats and unresponsiveness. S/p EGD and esophageal stent placement 9/11. PMH includes asthma, OA, COPD, GERD, DMII, MDD, emphysema.     Clinical Impressions PTA, pt reports he lived alone; per chart, plan is to stay with brother after acute hospitalization. Upon eval, pt with decreased safety, awareness, strength, balance, endurance, and cognition. Pt needing direct cues for sequencing during mobility and grossly min A +2 for safety. Pt with poor seated balance EOB requiring UE support. Following one step commands with increased time. Lethargic during session, but RN reports arousal has been good today, with sponge bath provided and pt engaging prior to OT arrival. Will continue to follow. Patient will benefit from continued inpatient follow up therapy, <3 hours/day      If plan is discharge home, recommend the following:   A lot of help with walking and/or transfers;Two people to help with walking and/or transfers;A lot of help with bathing/dressing/bathroom;Two people to help with bathing/dressing/bathroom;Assistance with cooking/housework;Assist for transportation;Help with stairs or ramp for entrance     Functional Status Assessment   Patient has had a recent decline in their functional status and demonstrates the ability to make significant improvements in function in a reasonable and predictable amount of time.     Equipment Recommendations   Other (comment)  (defer)     Recommendations for Other Services         Precautions/Restrictions   Precautions Precautions: Fall Recall of Precautions/Restrictions: Impaired Precaution/Restrictions Comments: R chest tube, R JP drain x 2, J tube, NG tube, 4LO2, fecal management system Restrictions Weight Bearing Restrictions Per Provider Order: No     Mobility Bed Mobility Overal bed mobility: Needs Assistance Bed Mobility: Rolling, Sidelying to Sit Rolling: Min assist, +2 for safety/equipment Sidelying to sit: Min assist, +2 for physical assistance, +2 for safety/equipment, Used rails       General bed mobility comments: cues for sequence, light assist throughout    Transfers Overall transfer level: Needs assistance Equipment used: 2 person hand held assist Transfers: Sit to/from Stand Sit to Stand: Min assist, +2 safety/equipment, +2 physical assistance, From elevated surface (lowest position of ICU bed)     Step pivot transfers: Min assist, +2 physical assistance, +2 safety/equipment     General transfer comment: +2 for power up, cues for steps to L to chair      Balance Overall balance assessment: Needs assistance Sitting-balance support: Feet supported, Bilateral upper extremity supported Sitting balance-Leahy Scale: Fair Sitting balance - Comments: can sit EOB with supervision once balance is gained with posterior truncal support   Standing balance support: Bilateral upper extremity supported, During functional activity Standing balance-Leahy Scale: Poor                             ADL either performed or assessed with clinical judgement   ADL Overall ADL's : Needs assistance/impaired Eating/Feeding: NPO   Grooming: Sitting;Minimal assistance   Upper Body Bathing: Sitting;Moderate assistance   Lower Body Bathing: Maximal assistance;Sit to/from stand  Upper Body Dressing : Moderate assistance;Sitting   Lower Body Dressing: Maximal assistance;Sit  to/from stand   Toilet Transfer: Minimal assistance;+2 for safety/equipment;+2 for physical assistance           Functional mobility during ADLs: Minimal assistance;+2 for physical assistance;+2 for safety/equipment       Vision Patient Visual Report: No change from baseline       Perception         Praxis         Pertinent Vitals/Pain Pain Assessment Pain Assessment: Faces Faces Pain Scale: Hurts even more Pain Location: abdomen, everywhere Pain Descriptors / Indicators: Sore, Discomfort, Grimacing, Guarding Pain Intervention(s): Limited activity within patient's tolerance, Monitored during session     Extremity/Trunk Assessment Upper Extremity Assessment Upper Extremity Assessment: Generalized weakness   Lower Extremity Assessment Lower Extremity Assessment: Defer to PT evaluation   Cervical / Trunk Assessment Cervical / Trunk Assessment: Other exceptions Cervical / Trunk Exceptions: abdominal incision, Jtube   Communication Communication Communication: Impaired Factors Affecting Communication: Reduced clarity of speech   Cognition Arousal: Alert Behavior During Therapy: Flat affect Cognition: No family/caregiver present to determine baseline, Cognition impaired   Orientation impairments: Time (day) Awareness: Online awareness impaired Memory impairment (select all impairments): Short-term memory, Declarative long-term memory, Working memory Attention impairment (select first level of impairment): Sustained attention (sustains for short periods) Executive functioning impairment (select all impairments): Initiation, Organization, Sequencing, Problem solving OT - Cognition Comments: pt needs cues to sustain tasks and sequence through mobility.                 Following commands: Impaired Following commands impaired: Follows one step commands with increased time     Cueing  General Comments   Cueing Techniques: Verbal cues;Tactile cues  on RA  with VSS   Exercises     Shoulder Instructions      Home Living Family/patient expects to be discharged to:: Private residence Living Arrangements: Other relatives (brother) Available Help at Discharge: Family;Available 24 hours/day Type of Home: House Home Access: Stairs to enter [employer] of Steps: 1 Entrance Stairs-Rails: None Home Layout: Two level;Able to live on main level with bedroom/bathroom     Bathroom Shower/Tub: Chief Strategy Officer: Standard     Home Equipment: Agricultural consultant (2 wheels);Cane - single point   Additional Comments: discharging to his brother's home      Prior Functioning/Environment Prior Level of Function : Independent/Modified Independent             Mobility Comments: ambulatory without DME ADLs Comments: pt reports being independent in ADL, IADL, driving PTA    OT Problem List: Decreased strength;Decreased activity tolerance;Impaired balance (sitting and/or standing);Decreased cognition;Decreased safety awareness;Decreased knowledge of use of DME or AE;Decreased knowledge of precautions;Pain   OT Treatment/Interventions: Self-care/ADL training;Therapeutic exercise;DME and/or AE instruction;Therapeutic activities;Patient/family education;Balance training      OT Goals(Current goals can be found in the care plan section)   Acute Rehab OT Goals Patient Stated Goal: get better OT Goal Formulation: With patient Time For Goal Achievement: 02/08/24 Potential to Achieve Goals: Good   OT Frequency:  Min 2X/week    Co-evaluation              AM-PAC OT 6 Clicks Daily Activity     Outcome Measure Help from another person eating meals?: Total Help from another person taking care of personal grooming?: A Little Help from another person toileting, which includes using toliet, bedpan, or urinal?: A Lot Help  from another person bathing (including washing, rinsing, drying)?: A Lot Help from another person  to put on and taking off regular upper body clothing?: A Lot Help from another person to put on and taking off regular lower body clothing?: A Lot 6 Click Score: 12   End of Session Equipment Utilized During Treatment: Gait belt Nurse Communication: Mobility status  Activity Tolerance: Patient tolerated treatment well Patient left: in chair;with call bell/phone within reach;with chair alarm set  OT Visit Diagnosis: Unsteadiness on feet (R26.81);Muscle weakness (generalized) (M62.81);Other symptoms and signs involving cognitive function;Pain Pain - part of body:  (everywhere)                Time: 8867-8848 OT Time Calculation (min): 19 min Charges:  OT General Charges $OT Visit: 1 Visit OT Evaluation $OT Eval Moderate Complexity: 1 Mod  Elma JONETTA Lebron FREDERICK, OTR/L [facility] Acute Rehabilitation Office: [phone]   Elma JONETTA Lebron 01/25/2024, 12:45 PM

## 2024-01-26 ENCOUNTER — Inpatient Hospital Stay (HOSPITAL_COMMUNITY)

## 2024-01-26 LAB — BASIC METABOLIC PANEL WITH GFR
Anion gap: 11 (ref 5–15)
BUN: 23 mg/dL (ref 8–23)
CO2: 28 mmol/L (ref 22–32)
Calcium: 8.7 mg/dL — ABNORMAL LOW (ref 8.9–10.3)
Chloride: 111 mmol/L (ref 98–111)
Creatinine, Ser: 0.73 mg/dL (ref 0.61–1.24)
GFR, Estimated: >60 mL/min (ref >60)
Glucose, Bld: 168 mg/dL — ABNORMAL HIGH (ref 70–99)
Potassium: 3.6 mmol/L (ref 3.5–5.1)
Sodium: 150 mmol/L — ABNORMAL HIGH (ref 135–145)

## 2024-01-26 LAB — CBC
HCT: 30.6 % — ABNORMAL LOW (ref 39.0–52.0)
Hemoglobin: 9.6 g/dL — ABNORMAL LOW (ref 13.0–17.0)
MCH: 32.9 pg (ref 26.0–34.0)
MCHC: 31.4 g/dL (ref 30.0–36.0)
MCV: 104.8 fL — ABNORMAL HIGH (ref 80.0–100.0)
Platelets: 503 K/uL — ABNORMAL HIGH (ref 150–400)
RBC: 2.92 MIL/uL — ABNORMAL LOW (ref 4.22–5.81)
RDW: 13.8 % (ref 11.5–15.5)
WBC: 25.6 K/uL — ABNORMAL HIGH (ref 4.0–10.5)
nRBC: 0 % (ref 0.0–0.2)

## 2024-01-26 LAB — GLUCOSE, CAPILLARY
Glucose-Capillary: 130 mg/dL — ABNORMAL HIGH (ref 70–99)
Glucose-Capillary: 148 mg/dL — ABNORMAL HIGH (ref 70–99)
Glucose-Capillary: 149 mg/dL — ABNORMAL HIGH (ref 70–99)
Glucose-Capillary: 157 mg/dL — ABNORMAL HIGH (ref 70–99)
Glucose-Capillary: 160 mg/dL — ABNORMAL HIGH (ref 70–99)
Glucose-Capillary: 160 mg/dL — ABNORMAL HIGH (ref 70–99)

## 2024-01-26 MED ORDER — PNEUMOCOCCAL 20-VAL CONJ VACC 0.5 ML IM SUSY
0.5000 mL | PREFILLED_SYRINGE | INTRAMUSCULAR | Status: AC
  Administered 2024-01-27: 0.5 mL via INTRAMUSCULAR
  Filled 2024-01-26: qty 0.5

## 2024-01-26 MED ORDER — POTASSIUM CHLORIDE 20 MEQ PO PACK
40.0000 meq | PACK | Freq: Once | ORAL | Status: AC
Start: 2024-01-26 — End: 2024-01-26
  Administered 2024-01-26: 40 meq
  Filled 2024-01-26: qty 2

## 2024-01-26 MED ORDER — FREE WATER
100.0000 mL | Status: DC
  Administered 2024-01-26 – 2024-01-27 (×11): 100 mL

## 2024-01-26 NOTE — Progress Notes (Signed)
 3 Days Post-Op  Subjective: Afebrile, vitals stable, says he is tired this morning, no complaints. Tolerating tube feeds.  Having bowel movements.   Objective: Vital signs in last 24 hours: Temp:  [97.8 F (36.6 C)-98.1 F (36.7 C)] 98 F (36.7 C) (09/14 0759) Pulse Rate:  [71-94] 94 (09/14 0921) Resp:  [8-21] 21 (09/14 0900) BP: (114-159)/(70-114) 157/107 (09/14 0921) SpO2:  [94 %-100 %] 96 % (09/14 0900) FiO2 (%):  [21 %] 21 % (09/14 0400) Weight:  [77.3 kg] 77.3 kg (09/14 0500) Last BM Date : 01/25/24  Intake/Output from previous day: 09/13 0701 - 09/14 0700 In: 2902.8 [I.V.:65; NG/GT:2207.5; IV Piggyback:445.3] Out: 2838 [Urine:1300; Emesis/NG output:650; Drains:158; Stool:550; Chest Tube:180] Intake/Output this shift: Total I/O In: 362.4 [I.V.:10; NG/GT:340; IV Piggyback:12.4] Out: 865 [Urine:300; Drains:15; Stool:500; Chest Tube:50]  PE: General: resting in bed, drowsy Neuro: awakens, sleepy but appropriate HEENT: NG in place with dark brown fluid, no blood noted  Resp: breathing spontaneously, chest tube with purulent drainage CV: RRR Abdomen: very soft and nondistended. Upper midline incision clean and dry. RUQ JP with serosanguinous drainage. J tube with feeds running at 60 ml/hr.    Lab Results:  Recent Labs    01/25/24 0541 01/26/24 0412  WBC 20.6* 25.6*  HGB 9.8* 9.6*  HCT 30.7* 30.6*  PLT 458* 503*   BMET Recent Labs    01/25/24 0541 01/26/24 0412  NA 147* 150*  K 3.4* 3.6  CL 111 111  CO2 26 28  GLUCOSE 131* 168*  BUN 15 23  CREATININE 0.84 0.73  CALCIUM  8.2* 8.7*   PT/INR No results for input(s): LABPROT, INR in the last 72 hours. CMP     Component Value Date/Time   NA 150 (H) 01/26/2024 0412   K 3.6 01/26/2024 0412   CL 111 01/26/2024 0412   CO2 28 01/26/2024 0412   GLUCOSE 168 (H) 01/26/2024 0412   BUN 23 01/26/2024 0412   CREATININE 0.73 01/26/2024 0412   CREATININE 0.85 10/10/2023 0824   CALCIUM  8.7 (L) 01/26/2024  0412   PROT 6.7 01/10/2024 1130   ALBUMIN  1.9 (L) 01/23/2024 0326   AST 17 01/10/2024 1130   AST 15 10/10/2023 0824   ALT 11 01/10/2024 1130   ALT 11 10/10/2023 0824   ALKPHOS 32 (L) 01/10/2024 1130   BILITOT 0.6 01/10/2024 1130   BILITOT 0.5 10/10/2023 0824   GFRNONAA >60 01/26/2024 0412   GFRNONAA >60 10/10/2023 0824   GFRAA  10/16/2007 1246    >60        The eGFR has been calculated using the MDRD equation. This calculation has not been validated in all clinical   Lipase  No results found for: LIPASE   Assessment/Plan  Mr. Wilmeth is a 68 yo male with gastric cardia adenocarcinoma, POD11 s/p total gastrectomy, with distal esophagectomy and roux-en-Y esophagojejunostomy on 9/3. POD10 s/p takeback by thoracic surgery for RATS repair of anastomotic leak on 9/4. POD3 s/p EGD with placement of esophageal stent on 9/11. - EJ anastomotic leak: S/p esophageal stent placement. Continue broad-spectrum antibiotics (Zosyn  and fluconazole ). Strict NPO, NG to LIS, drains in place. - Pain control - T2DM: on sliding scale novolog  - A-fib with RVR: Converted to sinus rhythm 9/9, remains in sinus. Diltiazem  and metoprolol  per J tube - FEN: strict NPO, NG tube to LIS, continue J tube feeds at goal - add some free water  flushes - VTE: prophylactic lovenox , therapeutic anticoagulation (for a-fib) on hold for now - Dispo:  ICU    LOS: 11 days    Deward JINNY Foy, MD [facility] Surgery General, Hepatobiliary and Pancreatic Surgery 01/26/24 11:12 AM

## 2024-01-26 NOTE — Progress Notes (Signed)
      [address] [employer] [address]             [phone]    10 Days Post-Op Esophagectomy  9 Days Post-Op ROBOT-ASSISTED THORACOSCOPY REPAIR OF ESOPHAGEAL LEAK USING MYRIAD MATRIX (N/A)    3 Days Post-Op EGD, Stenting EJ anastomosis   Subjective:  Awake and alert, appears comfortable. No new complaints or concerns. Now on RA.   TF at 50ml/hr Zosyn  infusing.   Objective: Vital signs in last 24 hours: Temp:  [97.8 F (36.6 C)-98.1 F (36.7 C)] 98 F (36.7 C) (09/14 0759) Pulse Rate:  [71-94] 94 (09/14 0921) Cardiac Rhythm: Normal sinus rhythm (09/14 0800) Resp:  [8-21] 19 (09/14 0600) BP: (114-164)/(70-114) 157/107 (09/14 0921) SpO2:  [93 %-100 %] 100 % (09/14 0600) FiO2 (%):  [21 %] 21 % (09/14 0400) Weight:  [77.3 kg] 77.3 kg (09/14 0500)     Intake/Output from previous day: 09/13 0701 - 09/14 0700 In: 2902.8 [I.V.:65; NG/GT:2207.5; IV Piggyback:445.3] Out: 2838 [Urine:1300; Emesis/NG output:650; Drains:158; Stool:550; Chest Tube:180] Intake/Output this shift: Total I/O In: 124.2 [NG/GT:120; IV Piggyback:4.2] Out: 245 [Urine:200; Drains:15; Chest Tube:30]  General appearance:  alert and conversant. Now new concerns.  Heart: continues to maintain SR ~90-100/min. Lungs: breath sounds clear anterior. Oxygenating well on RA ABD: soft without tenderness Wound:the chest incisions and tube sites are dry. The chest tube had ~131ml turbid beige fluid past 24 hours.  Chest JP drain had ~72ml  turbid beige drainage  for past 24 hours.   EXTS: well perfused  Lab Results: Recent Labs    01/25/24 0541 01/26/24 0412  WBC 20.6* 25.6*  HGB 9.8* 9.6*  HCT 30.7* 30.6*  PLT 458* 503*   BMET:  Recent Labs    01/25/24 0541 01/26/24 0412  NA 147* 150*  K 3.4* 3.6  CL 111 111  CO2 26 28  GLUCOSE 131* 168*  BUN 15 23  CREATININE 0.84 0.73  CALCIUM  8.2* 8.7*    PT/INR: No results for input(s): LABPROT, INR in the last 72 hours. ABG     Component Value Date/Time   PHART 7.362 01/23/2024 1416   HCO3 26.0 01/23/2024 1416   TCO2 27 01/23/2024 1416   ACIDBASEDEF 2.0 01/15/2024 1459   O2SAT 89 01/23/2024 1416   CBG (last 3)  Recent Labs    01/25/24 2335 01/26/24 0341 01/26/24 0726  GLUCAP 149* 160* 160*    Assessment/Plan: S/P Procedure(s) (LRB): EGD (ESOPHAGOGASTRODUODENOSCOPY) (N/A) INSERTION, STENT, ESOPHAGUS (N/A)  CV: Continues to maintain SR on diltiazem  and metoprolol  suspension per J-tube. K+ 3.4.   Pulm: improved.  Comfortable and oxygenating well on RA. CXR showing no unexpected changes.   GI: J tube feeds at 48ml/hr, having frequent loose bowel movements as expected.    ID: POD 3 EGD, stenting of EJ anastomotic leak.  WBC 20K -- 25K. Not febrile, no obvious source on exam.   On empiric Zosyn  and Diflucan  for anastomotic leak at esophagojejunostomy.  Continue drainage of right pleural space with CT and JP.     LOS: 11 days    Zekiah Caruth G. Eamonn Sermeno, PA-C 01/26/2024

## 2024-01-27 LAB — BASIC METABOLIC PANEL WITH GFR
Anion gap: 13 (ref 5–15)
BUN: 27 mg/dL — ABNORMAL HIGH (ref 8–23)
CO2: 28 mmol/L (ref 22–32)
Calcium: 8.8 mg/dL — ABNORMAL LOW (ref 8.9–10.3)
Chloride: 109 mmol/L (ref 98–111)
Creatinine, Ser: 0.74 mg/dL (ref 0.61–1.24)
GFR, Estimated: >60 mL/min (ref >60)
Glucose, Bld: 165 mg/dL — ABNORMAL HIGH (ref 70–99)
Potassium: 3.9 mmol/L (ref 3.5–5.1)
Sodium: 150 mmol/L — ABNORMAL HIGH (ref 135–145)

## 2024-01-27 LAB — CBC
HCT: 27.6 % — ABNORMAL LOW (ref 39.0–52.0)
Hemoglobin: 8.7 g/dL — ABNORMAL LOW (ref 13.0–17.0)
MCH: 33 pg (ref 26.0–34.0)
MCHC: 31.5 g/dL (ref 30.0–36.0)
MCV: 104.5 fL — ABNORMAL HIGH (ref 80.0–100.0)
Platelets: 556 K/uL — ABNORMAL HIGH (ref 150–400)
RBC: 2.64 MIL/uL — ABNORMAL LOW (ref 4.22–5.81)
RDW: 13.7 % (ref 11.5–15.5)
WBC: 25.4 K/uL — ABNORMAL HIGH (ref 4.0–10.5)
nRBC: 0 % (ref 0.0–0.2)

## 2024-01-27 LAB — GLUCOSE, CAPILLARY
Glucose-Capillary: 142 mg/dL — ABNORMAL HIGH (ref 70–99)
Glucose-Capillary: 171 mg/dL — ABNORMAL HIGH (ref 70–99)
Glucose-Capillary: 150 mg/dL — ABNORMAL HIGH (ref 70–99)
Glucose-Capillary: 140 mg/dL — ABNORMAL HIGH (ref 70–99)
Glucose-Capillary: 167 mg/dL — ABNORMAL HIGH (ref 70–99)

## 2024-01-27 MED ORDER — OXYCODONE HCL 5 MG/5ML PO SOLN
5.0000 mg | ORAL | Status: DC | PRN
  Administered 2024-01-27 – 2024-01-28 (×2): 10 mg via JEJUNOSTOMY
  Administered 2024-01-28: 5 mg via JEJUNOSTOMY
  Administered 2024-01-29 (×2): 10 mg via JEJUNOSTOMY
  Administered 2024-01-29 – 2024-01-30 (×2): 5 mg via JEJUNOSTOMY
  Filled 2024-01-27 (×3): qty 5
  Filled 2024-01-27 (×4): qty 10

## 2024-01-27 MED ORDER — FREE WATER
150.0000 mL | Status: DC
  Administered 2024-01-27 – 2024-01-28 (×14): 150 mL

## 2024-01-27 MED ORDER — LOPERAMIDE HCL 1 MG/7.5ML PO SUSP
2.0000 mg | Freq: Three times a day (TID) | ORAL | Status: DC
  Administered 2024-01-27 – 2024-01-29 (×8): 2 mg via JEJUNOSTOMY
  Filled 2024-01-27 (×8): qty 15

## 2024-01-27 MED ORDER — BANATROL TF EN LIQD
60.0000 mL | Freq: Two times a day (BID) | ENTERAL | Status: DC
  Administered 2024-01-27 – 2024-02-01 (×12): 60 mL
  Filled 2024-01-27 (×12): qty 60

## 2024-01-27 NOTE — Progress Notes (Signed)
 Physical Therapy Treatment Patient Details Name: [name] MRN: [id number] DOB: April 03, 1956 Today's Date: 01/27/2024   History of Present Illness 68 y.o. male presents to [facility] hospital on 01/15/2024 with gastric CA. Pt underwent total gastrectomy with omentectomy and D2 lymphadenectomy, esophagojejunostomy with Jtube placement, splenic biopsy on 9/3. Pt developed bilious output into chest tube on 9/4, returned to OR for EGD and thoracoscopy with application of wound matrix to EJ anastomosis. ETT 9/11-9/12 for desats and unresponsiveness. S/p EGD and esophageal stent placement 9/11. PMH includes asthma, OA, COPD, GERD, DMII, MDD, emphysema.    PT Comments  Pt up in chair upon arrival to room, states he is tired of being up in chair given buttocks pain. Pt progressing well to room-distance ambulation this date, benefits from use of RW for bodyweight support and balance. Pt improving physically, will need to progress further for considering d/c home with family support. Per chart review, an option is home with brother, but at this point brother would have to be able to physically assist pt. PT to continue to follow.      If plan is discharge home, recommend the following: Assistance with cooking/housework;Assist for transportation;Help with stairs or ramp for entrance;Direct supervision/assist for medications management;Direct supervision/assist for financial management;Supervision due to cognitive status;A lot of help with walking and/or transfers;A lot of help with bathing/dressing/bathroom   Can travel by private vehicle        Equipment Recommendations  Other (comment) (tbd)    Recommendations for Other Services       Precautions / Restrictions Precautions Precautions: Fall Recall of Precautions/Restrictions: Impaired Precaution/Restrictions Comments: R chest tube, R JP drain x 2, J tube, NG tube (clamped prior to PT arrival, left clamped as per RN), fecal management  system Restrictions Weight Bearing Restrictions Per Provider Order: No     Mobility  Bed Mobility Overal bed mobility: Needs Assistance Bed Mobility: Rolling, Sit to Sidelying Rolling: Min assist       Sit to sidelying: Min assist General bed mobility comments: assist for lowering trunk, completion of LE lift into bed, management of lines/leads.    Transfers Overall transfer level: Needs assistance Equipment used: Rolling walker (2 wheels) Transfers: Sit to/from Stand Sit to Stand: Mod assist, +2 safety/equipment           General transfer comment: assist for power up, rise, steadying. Stand x2, from recliner and EOB.    Ambulation/Gait Ambulation/Gait assistance: Mod assist, +2 safety/equipment Gait Distance (Feet): 12 Feet Assistive device: Rolling walker (2 wheels) Gait Pattern/deviations: Step-through pattern, Decreased stride length, Trunk flexed, Knee flexed in stance - right, Knee flexed in stance - left Gait velocity: decr     General Gait Details: assist to steady, guide RW, support bodyweight with fatigue   Stairs             Wheelchair Mobility     Tilt Bed    Modified Rankin (Stroke Patients Only)       Balance Overall balance assessment: Needs assistance Sitting-balance support: Feet supported, Bilateral upper extremity supported Sitting balance-Leahy Scale: Fair     Standing balance support: Bilateral upper extremity supported, During functional activity, Reliant on assistive device for balance Standing balance-Leahy Scale: Poor                              Communication Communication Communication: Impaired Factors Affecting Communication: Reduced clarity of speech  Cognition Arousal: Alert Behavior During Therapy: Flat affect  PT - Cognitive impairments: Sequencing, Awareness, Problem solving, Safety/Judgement                       PT - Cognition Comments: poor awareness of safety, cues for safety  throughout mobility. Following commands: Impaired Following commands impaired: Follows one step commands with increased time    Cueing Cueing Techniques: Verbal cues, Tactile cues  Exercises      General Comments General comments (skin integrity, edema, etc.): vss on RA      Pertinent Vitals/Pain Pain Assessment Pain Assessment: Faces Faces Pain Scale: Hurts even more Pain Location: abdomen, chest, R flank with RUE movement Pain Descriptors / Indicators: Sore, Discomfort, Grimacing, Guarding Pain Intervention(s): Limited activity within patient's tolerance, Monitored during session, Repositioned    Home Living                          Prior Function            PT Goals (current goals can now be found in the care plan section) Acute Rehab PT Goals Patient Stated Goal: to return to independence PT Goal Formulation: With patient Time For Goal Achievement: 01/31/24 Potential to Achieve Goals: Fair Progress towards PT goals: Progressing toward goals    Frequency    Min 2X/week      PT Plan      Co-evaluation              AM-PAC PT 6 Clicks Mobility   Outcome Measure  Help needed turning from your back to your side while in a flat bed without using bedrails?: A Little Help needed moving from lying on your back to sitting on the side of a flat bed without using bedrails?: A Little Help needed moving to and from a bed to a chair (including a wheelchair)?: A Lot Help needed standing up from a chair using your arms (e.g., wheelchair or bedside chair)?: A Lot Help needed to walk in hospital room?: A Lot Help needed climbing 3-5 steps with a railing? : Total 6 Click Score: 13    End of Session Equipment Utilized During Treatment: Oxygen Activity Tolerance: Treatment limited secondary to medical complications (Comment);Patient limited by pain Patient left: in bed;with call bell/phone within reach;with bed alarm set;with nursing/sitter in room Nurse  Communication: Mobility status PT Visit Diagnosis: Other abnormalities of gait and mobility (R26.89);Muscle weakness (generalized) (M62.81);Pain Pain - part of body:  (abd)     Time: 8497-8472 PT Time Calculation (min) (ACUTE ONLY): 25 min  Charges:    $Gait Training: 8-22 mins $Therapeutic Activity: 8-22 mins PT General Charges $$ ACUTE PT VISIT: 1 Visit                     [name], PT DPT Acute Rehabilitation Services Secure Chat Preferred  Office [phone]    Raeley Gilmore E Johna 01/27/2024, 3:45 PM

## 2024-01-27 NOTE — Plan of Care (Signed)
  Problem: Fluid Volume: Goal: Ability to maintain a balanced intake and output will improve Outcome: Progressing   Problem: Skin Integrity: Goal: Risk for impaired skin integrity will decrease Outcome: Progressing   Problem: Tissue Perfusion: Goal: Adequacy of tissue perfusion will improve Outcome: Progressing   Problem: Pain Managment: Goal: General experience of comfort will improve and/or be controlled Outcome: Progressing

## 2024-01-27 NOTE — Progress Notes (Addendum)
 4 Days Post-Op Procedure(s) (LRB): EGD (ESOPHAGOGASTRODUODENOSCOPY) (N/A) INSERTION, STENT, ESOPHAGUS (N/A) Subjective: More alert, NAD, generalized soreness, loose BM's  Objective: Vital signs in last 24 hours: Temp:  [97.9 F (36.6 C)-98.8 F (37.1 C)] 98.2 F (36.8 C) (09/15 0400) Pulse Rate:  [76-94] 87 (09/15 0600) Cardiac Rhythm: Normal sinus rhythm;Heart block (09/15 0400) Resp:  [11-28] 15 (09/15 0600) BP: (120-173)/(65-112) 168/95 (09/15 0600) SpO2:  [89 %-100 %] 95 % (09/15 0600) Weight:  [75.1 kg] 75.1 kg (09/15 0439)  Hemodynamic parameters for last 24 hours:    Intake/Output from previous day: 09/14 0701 - 09/15 0700 In: 3021.8 [I.V.:20; NG/GT:2540; IV Piggyback:351.8] Out: 3170 [Urine:1900; Emesis/NG output:275; Drains:115; Stool:800; Chest Tube:80] Intake/Output this shift: No intake/output data recorded.  General appearance: alert, distracted, fatigued, and no distress Heart: regular rate and rhythm Lungs: fairly clear anteriorly Abdomen: soft, non distended Extremities: no edema Wound: healing well  Lab Results: Recent Labs    01/26/24 0412 01/27/24 0635  WBC 25.6* 25.4*  HGB 9.6* 8.7*  HCT 30.6* 27.6*  PLT 503* 556*   BMET:  Recent Labs    01/25/24 0541 01/26/24 0412  NA 147* 150*  K 3.4* 3.6  CL 111 111  CO2 26 28  GLUCOSE 131* 168*  BUN 15 23  CREATININE 0.84 0.73  CALCIUM  8.2* 8.7*    PT/INR: No results for input(s): LABPROT, INR in the last 72 hours. ABG    Component Value Date/Time   PHART 7.362 01/23/2024 1416   HCO3 26.0 01/23/2024 1416   TCO2 27 01/23/2024 1416   ACIDBASEDEF 2.0 01/15/2024 1459   O2SAT 89 01/23/2024 1416   CBG (last 3)  Recent Labs    01/26/24 1954 01/26/24 2339 01/27/24 0343  GLUCAP 130* 148* 142*    Meds Scheduled Meds:  acetaminophen  (TYLENOL ) oral liquid 160 mg/5 mL  1,000 mg Per J Tube QID   Chlorhexidine  Gluconate Cloth  6 each Topical Daily   diltiazem   30 mg Per Tube Q6H    enoxaparin  (LOVENOX ) injection  40 mg Subcutaneous Q24H   feeding supplement (PROSource TF20)  60 mL Per Tube BID   free water   100 mL Per Tube Q2H   ibuprofen   600 mg Per Tube Q6H   insulin  aspart  0-15 Units Subcutaneous Q4H   lidocaine   1 patch Transdermal Q24H   metoprolol  tartrate  50 mg Per J Tube BID   multivitamin  15 mL Per Tube Daily   mouth rinse  15 mL Mouth Rinse 4 times per day   oxyCODONE   10 mg Per J Tube Q4H   pneumococcal 20-valent conjugate vaccine  0.5 mL Intramuscular Tomorrow-1000   sodium chloride  flush  10-40 mL Intracatheter Q12H   Continuous Infusions:  feeding supplement (VITAL 1.5 CAL) 60 mL/hr at 01/27/24 0600   fluconazole  (DIFLUCAN ) IV Stopped (01/26/24 1320)   piperacillin -tazobactam (ZOSYN )  IV 12.5 mL/hr at 01/27/24 0600   PRN Meds:.albuterol , diphenhydrAMINE , haloperidol  lactate, hydrALAZINE , metoprolol  tartrate, morphine  injection, ondansetron  (ZOFRAN ) IV, mouth rinse, oxyCODONE , sodium chloride  flush  Xrays DG Chest Port 1 View Result Date: 01/26/2024 CLINICAL DATA:  68 year old male intubated. Carcinoma of the gastric cardia status post surgery with possible anastomotic leak. Distal esophageal stent now. EXAM: PORTABLE CHEST 1 VIEW COMPARISON:  Portable chest yesterday and earlier. FINDINGS: Portable AP upright view at 0541 hours. Stable lines and tubes. Distal esophageal metal stent redemonstrated with enteric tube passing through. Mildly improved lung volumes. Stable cardiac size and mediastinal contours. No pneumothorax or pleural  effusion identified. No pulmonary edema or confluent lung opacity now. Stable visualized osseous structures. IMPRESSION: 1. Stable lines and tubes. 2. Improved lung volumes and bilateral ventilation. 3. No new cardiopulmonary abnormality. Electronically Signed   By: VEAR Hurst M.D.   On: 01/26/2024 09:30   DG Chest Port 1 View Result Date: 01/25/2024 CLINICAL DATA:  Respiratory failure. History total gastrectomy with esophago  jejunostomy. EXAM: PORTABLE CHEST 1 VIEW COMPARISON:  01/24/2024 FINDINGS: Endotracheal tube is not visible on today's radiograph and has presumably been removed. A nasogastric tube extends down into the stomach. Right Port-A-Cath tip: SVC. Right chest tube and potential mediastinal drain observed similar to previous. A stent type device projects centrally over the lower chest, position unchanged. No visible pneumothorax. A vertical linear band of lucency just cephalad to the stent device to the right of midline projects over the upper cardiac shadow, and trace pneumomediastinum is not completely excluded. No other signs or findings supportive of pneumomediastinum at this time. Moderately low lung volumes. Indistinct interstitial opacity at the left lung base, aspiration pneumonitis not excluded. Stable fullness of the right hilum. IMPRESSION: 1. Endotracheal tube is not visible on today's radiograph and has presumably been removed. 2. A vertical linear band of lucency just cephalad to the stent device to the right of midline projects over the upper cardiac shadow, and trace pneumomediastinum is not completely excluded. No other signs or findings supportive of pneumomediastinum at this time. Clinically warranted, CT chest could be utilized to further characterize. 3. Indistinct interstitial opacity at the left lung base, aspiration pneumonitis not excluded. 4. Stable fullness of the right hilum. Electronically Signed   By: Ryan Salvage M.D.   On: 01/25/2024 11:47    Assessment/Plan: S/P Procedure(s) (LRB): EGD (ESOPHAGOGASTRODUODENOSCOPY) (N/A) INSERTION, STENT, ESOPHAGUS (N/A)  11 Days Post-Op Esophagectomy   10 Days Post-Op ROBOT-ASSISTED THORACOSCOPY REPAIR OF ESOPHAGEAL LEAK USING MYRIAD MATRIX (N/A)               4 Days Post-Op EGD, Stenting EJ anastomosis   1 afeb, s BP 120's-170's/ NSR 2 sats ok on RA 3 CT no drainage recorded/24h, abdominal drain (bulb) 40 ml/24h, right chest bulb 75  ml/24h- yellow purulence- on zosyn  and diflucan - cont  4 good UOP 5 hypernatremia, sodium 150, normal renal fxn, medical management and feedings as per gen surgery- free water  added yesterday 6 pulm hygiene and rehab as able      LOS: 12 days    Lemond FORBES Cera PA-C Pager [phone] 01/27/2024  Ok to start liquid diet Will remove large chest tube once tolerating PO  Pardeep Pautz O Miguelina Fore

## 2024-01-27 NOTE — Progress Notes (Signed)
 Nutrition Follow-up  DOCUMENTATION CODES:   Severe malnutrition in context of chronic illness  INTERVENTION:  Continue Vital 1.5 @ 60 ml/hr via J-tube (1440 ml per day) 60 ml ProSource TF20 BID   150 ml free water  every 2 hours   Provides: 2300 kcal, 137 grams protein, and 1100 ml free water . 2900 ml free water  daily TF + FWF)    Banatrol BID  NUTRITION DIAGNOSIS:   Severe Malnutrition related to cancer and cancer related treatments as evidenced by severe muscle depletion, moderate fat depletion, energy intake < or equal to 75% for > or equal to 1 month, percent weight loss (has lost 28 lbs, 15% in 6 months). - Ongoing  GOAL:   Patient will meet greater than or equal to 90% of their needs - Progressing  MONITOR:   Diet advancement, TF tolerance, Labs, I & O's, Skin  REASON FOR ASSESSMENT:   Consult Assessment of nutrition requirement/status, Enteral/tube feeding initiation and management  ASSESSMENT:   [name] is a 68 yo male who was diagnosed with uT2N0 adenocarcinoma of the gastric cardia earlier this year. EUS showed evidence of invasion into the GE junction. The patient completed 3 cycles neoadjuvant FLOT with some complications, and was not able to tolerate further chemotherapy. Presented to [facility] for surgery now post op total gastrectomy with distal esophagectomy, roux-en-Y esophagojejunostomy, J-tube. PMH of T2DM, GERD, HTN, a-fib, hernia repair.  9/3 - total gastrectomy with distal esophagectomy, roux-en-Y esophagojejunostomy, J-tube, chest tube, NGT placed to lIS  9/4 - s/p Thoracoscopy repair of esophageal leak using Myriad Matrix  9/6 - transferred to ICU for hypoxia and increased o2 requirements 9/7 - Trickle tube feeds started, 20 ml/hr 9/8 - Tube feeds advanced to 30 ml/hr, transferred to progressive  9/9 - Tube feeds advanced to 40 ml/hr increase by 10 ml every 6 hours until goal of 60 ml/hr 9/10 - TF held 9/11 - Back to OR for leak, esophageal stent  placed, intubated 9/12 - Extubated   Has been tolerating J-tube feeds at goal. Having bowel movements. Now allowed sips of clears per MD. Pt resting in bed, sleeping on visit, pt with mitts on. Per RN pt agitated and still confused overnight and this morning.  Family member concerned pt will continue to drink outside the hospital, he reports pt was a heavy drinker PTA and has not been able to stop.Per chart pt drinks 7 alcoholic drinks per week. Family member unable to clarify how many pt drinks but states it Is a lot.  Unable to educate pt on alcohol abstinence.  NGT to LIS.    Intake/Output Summary (Last 24 hours) at 01/27/2024 1130 Last data filed at 01/27/2024 1035 Gross per 24 hour  Intake 2949.48 ml  Output 2895 ml  Net 54.48 ml    Drains/Lines: Chest tube: 80 ml x 24 hours  JP abdomen drain: 100 ml x 24 hours  JP chest drain: 58 ml x 24 hours NGT: 275 ml x 24 hours   J- tube FMS: 800 ml x 24 hours   Admit weight: 80.7 kg Current weight: 75.1 kg   Nutritionally Relevant Medications: Scheduled Meds:  feeding supplement (PROSource TF20)  60 mL Per Tube BID   fiber supplement (BANATROL TF)  60 mL Per Tube BID   free water   150 mL Per Tube Q2H   insulin  aspart  0-15 Units Subcutaneous Q4H   metoprolol  tartrate  50 mg Per J Tube BID   multivitamin  15 mL Per  Tube Daily   Continuous Infusions:  feeding supplement (VITAL 1.5 CAL) 60 mL/hr at 01/27/24 0700   fluconazole  (DIFLUCAN ) IV 400 mg (01/27/24 1050)   piperacillin -tazobactam (ZOSYN )  IV 3.375 g (01/27/24 1044)   Labs Reviewed: Sodium 150 BUN 27 Calcium  8.8 CBG ranges from 130-171 mg/dL over the last 24 hours HgbA1c 5.7  Diet Order:   Diet Order             Diet clear liquid Room service appropriate? Yes; Fluid consistency: Thin  Diet effective now                   EDUCATION NEEDS:   Education needs have been addressed  Skin:  Skin Assessment: Skin Integrity Issues: Skin Integrity Issues::  Incisions Incisions: Closed surgical incision, abdomen  Last BM:  9/14 800 ml via FMS  Height:   Ht Readings from Last 1 Encounters:  01/16/24 5' 11 (1.803 m)    Weight:   Wt Readings from Last 1 Encounters:  01/27/24 75.1 kg    Ideal Body Weight:  78.2 kg  BMI:  Body mass index is 23.09 kg/m.  Estimated Nutritional Needs:   Kcal:  2300-2500 kcal  Protein:  120-140 gm  Fluid:  >/=2L/day   Olivia Kenning, RD Registered Dietitian  See Amion for more information

## 2024-01-27 NOTE — Progress Notes (Addendum)
 4 Days Post-Op  Subjective: Afebrile, vitals stable. On room air. Per RN has been agitated and disoriented overnight.  Objective: Vital signs in last 24 hours: Temp:  [97.4 F (36.3 C)-98.8 F (37.1 C)] 97.4 F (36.3 C) (09/15 0800) Pulse Rate:  [76-94] 86 (09/15 0830) Resp:  [11-28] 16 (09/15 0830) BP: (120-173)/(65-112) 144/76 (09/15 0700) SpO2:  [89 %-100 %] 93 % (09/15 0830) Weight:  [75.1 kg] 75.1 kg (09/15 0439) Last BM Date : 01/26/24  Intake/Output from previous day: 09/14 0701 - 09/15 0700 In: 3084.4 [I.V.:20; NG/GT:2600; IV Piggyback:354.4] Out: 3170 [Urine:1900; Emesis/NG output:275; Drains:115; Stool:800; Chest Tube:80] Intake/Output this shift: Total I/O In: 150 [NG/GT:150] Out: 285 [Urine:275; Drains:10]  PE: General: resting in bed, NAD Neuro: mildly confused but answers questions, oriented to place HEENT: NG in place with dark brown fluid Resp: nonlabored respirations on room air. Pleural JP and chest tube with purulent drainage. CV: RRR Abdomen: very soft and nondistended. Upper midline incision clean and dry. RUQ JP with serosanguinous drainage. J tube with feeds running at 60 ml/hr.    Lab Results:  Recent Labs    01/26/24 0412 01/27/24 0635  WBC 25.6* 25.4*  HGB 9.6* 8.7*  HCT 30.6* 27.6*  PLT 503* 556*   BMET Recent Labs    01/26/24 0412 01/27/24 0635  NA 150* 150*  K 3.6 3.9  CL 111 109  CO2 28 28  GLUCOSE 168* 165*  BUN 23 27*  CREATININE 0.73 0.74  CALCIUM  8.7* 8.8*   PT/INR No results for input(s): LABPROT, INR in the last 72 hours. CMP     Component Value Date/Time   NA 150 (H) 01/27/2024 0635   K 3.9 01/27/2024 0635   CL 109 01/27/2024 0635   CO2 28 01/27/2024 0635   GLUCOSE 165 (H) 01/27/2024 0635   BUN 27 (H) 01/27/2024 0635   CREATININE 0.74 01/27/2024 0635   CREATININE 0.85 10/10/2023 0824   CALCIUM  8.8 (L) 01/27/2024 0635   PROT 6.7 01/10/2024 1130   ALBUMIN  1.9 (L) 01/23/2024 0326   AST 17  01/10/2024 1130   AST 15 10/10/2023 0824   ALT 11 01/10/2024 1130   ALT 11 10/10/2023 0824   ALKPHOS 32 (L) 01/10/2024 1130   BILITOT 0.6 01/10/2024 1130   BILITOT 0.5 10/10/2023 0824   GFRNONAA >60 01/27/2024 0635   GFRNONAA >60 10/10/2023 0824   GFRAA  10/16/2007 1246    >60        The eGFR has been calculated using the MDRD equation. This calculation has not been validated in all clinical   Lipase  No results found for: LIPASE   Assessment/Plan  [name] is a 68 yo male with gastric cardia adenocarcinoma, POD12 s/p total gastrectomy, with distal esophagectomy and roux-en-Y esophagojejunostomy on 9/3. POD11 s/p takeback by thoracic surgery for RATS repair of anastomotic leak on 9/4. POD4 s/p EGD with placement of esophageal stent on 9/11. - EJ anastomotic leak: S/p esophageal stent placement. Continue broad-spectrum antibiotics (Zosyn  and fluconazole ). Ok for clear liquids per thoracic. - Pain control: stop scheduled oxycodone , continue prn oxycodone  and morphine . - T2DM: on sliding scale novolog  - A-fib with RVR: Converted to sinus rhythm 9/9, remains in sinus. Diltiazem  and metoprolol  per J tube - FEN: clear liquid diet, keep NG in place today, clamped. Continue J tube feeds at goal. Hypernatremia, stable at 150 - increase free water  flushes. Tube feed diarrhea - start banatrol, will add Imodium  if no improvement with fiber. -  VTE: prophylactic lovenox , therapeutic anticoagulation (for a-fib) on hold for now - Dispo: after discussion with nursing, will keep in ICU today given delirium at night.    LOS: 12 days    Jon LITTIE Dawn, MD [facility] General, Hepatobiliary and Pancreatic Surgery 01/27/24 9:10 AM

## 2024-01-27 NOTE — TOC Progression Note (Signed)
 Transition of Care [facility]) - Progression Note    Patient Details  Name: [name] MRN: [id number] Date of Birth: 1956-03-11  Transition of Care [facility]) CM/SW Contact  Inocente GORMAN Kindle, LCSW Phone Number: 01/27/2024, 5:31 PM  Clinical Narrative:    CSW continuing to follow for rehab needs. Patient with NG tube and J tube for feeds.    Expected Discharge Plan: Skilled Nursing Facility Barriers to Discharge: Continued Medical Work up, English as a second language teacher, SNF Pending bed offer               Expected Discharge Plan and Services     Post Acute Care Choice: Skilled Nursing Facility Living arrangements for the past 2 months: Single Family Home                                       Social Drivers of Health (SDOH) Interventions SDOH Screenings   Food Insecurity: No Food Insecurity (01/15/2024)  Housing: Low Risk  (01/15/2024)  Transportation Needs: No Transportation Needs (01/15/2024)  Utilities: Not At Risk (01/15/2024)  Depression (PHQ2-9): Low Risk  (11/25/2023)  Financial Resource Strain: Low Risk  (08/20/2023)  Social Connections: Unknown (01/15/2024)  Stress: No Stress Concern Present (08/20/2023)  Tobacco Use: High Risk (01/16/2024)    Readmission Risk Interventions    11/05/2023   12:23 PM  Readmission Risk Prevention Plan  Transportation Screening Complete  PCP or Specialist Appt within 3-5 Days Complete  HRI or Home Care Consult Complete  Social Work Consult for Recovery Care Planning/Counseling Complete  Palliative Care Screening Complete  Medication Review Oceanographer) Complete

## 2024-01-28 ENCOUNTER — Encounter (HOSPITAL_COMMUNITY): Payer: Self-pay | Admitting: Surgery

## 2024-01-28 LAB — PREPARE RBC (CROSSMATCH)

## 2024-01-28 LAB — CULTURE, BLOOD (ROUTINE X 2)
Culture: NO GROWTH
Culture: NO GROWTH

## 2024-01-28 LAB — BASIC METABOLIC PANEL WITH GFR
Anion gap: 7 (ref 5–15)
BUN: 34 mg/dL — ABNORMAL HIGH (ref 8–23)
CO2: 31 mmol/L (ref 22–32)
Calcium: 8.6 mg/dL — ABNORMAL LOW (ref 8.9–10.3)
Chloride: 106 mmol/L (ref 98–111)
Creatinine, Ser: 0.8 mg/dL (ref 0.61–1.24)
GFR, Estimated: >60 mL/min (ref >60)
Glucose, Bld: 209 mg/dL — ABNORMAL HIGH (ref 70–99)
Potassium: 3.8 mmol/L (ref 3.5–5.1)
Sodium: 144 mmol/L (ref 135–145)

## 2024-01-28 LAB — CBC
HCT: 21.6 % — ABNORMAL LOW (ref 39.0–52.0)
Hemoglobin: 6.7 g/dL — CL (ref 13.0–17.0)
MCH: 32.7 pg (ref 26.0–34.0)
MCHC: 31 g/dL (ref 30.0–36.0)
MCV: 105.4 fL — ABNORMAL HIGH (ref 80.0–100.0)
Platelets: 565 K/uL — ABNORMAL HIGH (ref 150–400)
RBC: 2.05 MIL/uL — ABNORMAL LOW (ref 4.22–5.81)
RDW: 13.8 % (ref 11.5–15.5)
WBC: 22.2 K/uL — ABNORMAL HIGH (ref 4.0–10.5)
nRBC: 0 % (ref 0.0–0.2)

## 2024-01-28 LAB — GLUCOSE, CAPILLARY
Glucose-Capillary: 111 mg/dL — ABNORMAL HIGH (ref 70–99)
Glucose-Capillary: 126 mg/dL — ABNORMAL HIGH (ref 70–99)
Glucose-Capillary: 148 mg/dL — ABNORMAL HIGH (ref 70–99)
Glucose-Capillary: 154 mg/dL — ABNORMAL HIGH (ref 70–99)
Glucose-Capillary: 178 mg/dL — ABNORMAL HIGH (ref 70–99)
Glucose-Capillary: 180 mg/dL — ABNORMAL HIGH (ref 70–99)

## 2024-01-28 MED ORDER — SODIUM CHLORIDE 0.9% IV SOLUTION
Freq: Once | INTRAVENOUS | Status: AC
Start: 2024-01-28 — End: 2024-01-28

## 2024-01-28 MED ORDER — FREE WATER
100.0000 mL | Status: DC
  Administered 2024-01-28 – 2024-01-29 (×10): 100 mL

## 2024-01-28 MED ORDER — QUETIAPINE FUMARATE 25 MG PO TABS
50.0000 mg | ORAL_TABLET | Freq: Every day | ORAL | Status: DC
  Administered 2024-01-28 – 2024-02-01 (×5): 50 mg via ORAL
  Filled 2024-01-28 (×5): qty 1

## 2024-01-28 MED ORDER — MORPHINE SULFATE (PF) 2 MG/ML IV SOLN
2.0000 mg | INTRAVENOUS | Status: DC | PRN
  Filled 2024-01-28: qty 1

## 2024-01-28 MED ORDER — MORPHINE SULFATE (PF) 2 MG/ML IV SOLN: 2.0000 mg | INTRAVENOUS | Status: DC | PRN

## 2024-01-28 MED ORDER — ENOXAPARIN SODIUM 80 MG/0.8ML IJ SOSY
1.0000 mg/kg | PREFILLED_SYRINGE | Freq: Two times a day (BID) | INTRAMUSCULAR | Status: DC
  Administered 2024-01-28: 75 mg via SUBCUTANEOUS
  Filled 2024-01-28: qty 0.75

## 2024-01-28 NOTE — Progress Notes (Addendum)
 5 Days Post-Op  Subjective: Remains restless and intermittently agitated. Per nursing did not sleep much overnight.   Objective: Vital signs in last 24 hours: Temp:  [97.1 F (36.2 C)-98.4 F (36.9 C)] 98.1 F (36.7 C) (09/16 0400) Pulse Rate:  [84-108] 88 (09/16 0700) Resp:  [16-26] 17 (09/16 0700) BP: (91-149)/(47-89) 91/47 (09/16 0700) SpO2:  [90 %-100 %] 92 % (09/16 0700) Last BM Date : 01/27/24  Intake/Output from previous day: 09/15 0701 - 09/16 0700 In: 3737.5 [P.O.:32; I.V.:10; NG/GT:3120; IV Piggyback:325.5] Out: 3770 [Urine:2190; Drains:40; Stool:1450; Chest Tube:90] Intake/Output this shift: No intake/output data recorded.  PE: General: resting in bed, restless Neuro: restless, opens eyes on command, oriented to place HEENT: NG in place, clamped Resp: nonlabored respirations on room air. Pleural JP and chest tube with purulent drainage. CV: RRR Abdomen: very soft and nondistended. Upper midline incision clean and dry. Clean dressing in place over prior drain site in RUQ. J tube with feeds running at 60 ml/hr.    Lab Results:  Recent Labs    01/26/24 0412 01/27/24 0635  WBC 25.6* 25.4*  HGB 9.6* 8.7*  HCT 30.6* 27.6*  PLT 503* 556*   BMET Recent Labs    01/26/24 0412 01/27/24 0635  NA 150* 150*  K 3.6 3.9  CL 111 109  CO2 28 28  GLUCOSE 168* 165*  BUN 23 27*  CREATININE 0.73 0.74  CALCIUM  8.7* 8.8*   PT/INR No results for input(s): LABPROT, INR in the last 72 hours. CMP     Component Value Date/Time   NA 150 (H) 01/27/2024 0635   K 3.9 01/27/2024 0635   CL 109 01/27/2024 0635   CO2 28 01/27/2024 0635   GLUCOSE 165 (H) 01/27/2024 0635   BUN 27 (H) 01/27/2024 0635   CREATININE 0.74 01/27/2024 0635   CREATININE 0.85 10/10/2023 0824   CALCIUM  8.8 (L) 01/27/2024 0635   PROT 6.7 01/10/2024 1130   ALBUMIN  1.9 (L) 01/23/2024 0326   AST 17 01/10/2024 1130   AST 15 10/10/2023 0824   ALT 11 01/10/2024 1130   ALT 11 10/10/2023 0824    ALKPHOS 32 (L) 01/10/2024 1130   BILITOT 0.6 01/10/2024 1130   BILITOT 0.5 10/10/2023 0824   GFRNONAA >60 01/27/2024 0635   GFRNONAA >60 10/10/2023 0824   GFRAA  10/16/2007 1246    >60        The eGFR has been calculated using the MDRD equation. This calculation has not been validated in all clinical   Lipase  No results found for: LIPASE   Assessment/Plan  Mr. Minjares is a 68 yo male with gastric cardia adenocarcinoma, POD13 s/p total gastrectomy, with distal esophagectomy and roux-en-Y esophagojejunostomy on 9/3. POD12 s/p takeback by thoracic surgery for RATS repair of anastomotic leak on 9/4. POD5 s/p EGD with placement of esophageal stent on 9/11. - EJ anastomotic leak: S/p esophageal stent placement. Continue broad-spectrum antibiotics (Zosyn  and fluconazole ). Ok for clear liquids per thoracic. - Abdominal JP drain was removed 9/15 - Pain control: PRN oxycodone  and morphine , scheduled tylenol  and ibuprofen . - T2DM: on sliding scale novolog  - A-fib with RVR: Converted to sinus rhythm 9/9, remains in sinus. Diltiazem  and metoprolol  per J tube. - Delirium: begin Seroquel  50mg  at bedtime. Crush up and give by mouth with liquid or applesauce, do not give per J tube. Continue PRN haldol . - FEN: clear liquid diet, NG has been clamped for 24 hours, drain output remains the same - remove NG tube (  discussed with thoracic). Continue J tube feeds at goal. On free water  flushes q2h for hypernatremia, labs pending this morning. Continue banatrol BID for tube feed diarrhea, added imodium  2mg  TID, stool output decreased overnight with addition of imodium . - VTE: resume therapeutic lovenox  for a-fib - Dispo: transfer to progressive care. PT recommending SNF at discharge.    LOS: 13 days    [name], MD [facility] Surgery General, Hepatobiliary and Pancreatic Surgery 01/28/24 8:05 AM   Addendum 10:19am: Labs reviewed, hyponatremia improved. Hgb 6.7, will transfuse 1u PRBCs.  Will hold lovenox  given drop in hgb.

## 2024-01-28 NOTE — Progress Notes (Signed)
 OT Cancellation Note  Patient Details Name: [name] MRN: [id number] DOB: 1956-03-13   Cancelled Treatment:    Reason Eval/Treat Not Completed: Fatigue/lethargy limiting ability to participate (pt sleeping, dozing off during conversation)  [name], OTD, OTR/L SecureChat Preferred Acute Rehab [phone]   [name] 01/28/2024, 2:38 PM

## 2024-01-28 NOTE — Progress Notes (Addendum)
   01/27/24 1500  SLP Visit Information  SLP Received On 01/27/24  General Information  HPI Mr. Sciandra is a 68 yo male with gastric cardia adenocarcinoma, POD12 s/p total gastrectomy, with distal esophagectomy and roux-en-Y esophagojejunostomy on 9/3.  POD11 s/p takeback by thoracic surgery for RATS repair of anastomotic leak on 9/4.  POD4 s/p EGD with placement of esophageal stent on 9/11. Intubated 9/3 and 9/4, respectively, for surgery, then again 9/11-9/12.  Type of Study Bedside Swallow Evaluation  Previous Swallow Assessment none  Diet Prior to this Study NPO;Large bore NG tube  Temperature Spikes Noted No  Respiratory Status Room air  History of Recent Intubation Yes  Total duration of intubation (days) 4 days  Date extubated 01/24/24  Behavior/Cognition Alert;Requires cueing  Oral Cavity Assessment Dry  Oral Care Completed by SLP Recent completion by staff  Self-Feeding Abilities Needs assist  Patient Positioning Upright in chair  Baseline Vocal Quality Normal  Volitional Cough Strong  Volitional Swallow Able to elicit  Pain Assessment  Pain Assessment Faces  Faces Pain Scale 6  Oral Motor/Sensory Function  Overall Oral Motor/Sensory Function WFL  Ice Chips  Ice chips WFL  Thin Liquid  Thin Liquid WFL  Presentation Straw;Self Fed  Nectar Thick Liquid  Nectar Thick Liquid NT  Honey Thick Liquid  Honey Thick Liquid NT  Puree  Puree NT  Solid  Solid NT  Suspected Esophageal Findings  Suspected Esophageal Findings Belching  SLP Assessment  Clinical Impression Statement (ACUTE ONLY) Pt demonstrates no immediate signs of aspriation with sips of water  and large consecutive consumption of water  (3oz). Pt initially a bit confused, requiring cues to attend to ice chip trials. Becamse more aware and ws consistently able to grasp a d cup and self feed with a straw. Pt had no coughing, but did have some belching. Vocal quality dry. Recommend initiating clear liquids and SLP will  f/u for tolerance. If any concerns arise, could complete a FEES as pt is NOT ALLOWED BARIUM.  SLP Visit Diagnosis Dysphagia, unspecified (R13.10)  Impact on safety and function Mild aspiration risk  Other Related Risk Factors History of esophageal-related issues  Swallow Evaluation Recommendations  SLP Diet Recommendations Thin liquid  Liquid Administration via Cup;Straw  Medication Administration Via alternative means  Supervision Staff to assist with self feeding  Treatment Plan  Oral Care Recommendations Oral care BID  Caregiver Recommendations Have oral suction available  Treatment Recommendations Therapy as outlined in treatment plan below  Functional Status Assessment Patient has had a recent decline in their functional status and demonstrates the ability to make significant improvements in function in a reasonable and predictable amount of time.  Speech Therapy Frequency (ACUTE ONLY) min 2x/week  Prognosis  Prognosis for improved oropharyngeal function Good  Individuals Consulted  Consulted and Agree with Results and Recommendations RN  SLP Time Calculation  SLP Start Time (ACUTE ONLY) 1450  SLP Stop Time (ACUTE ONLY) 1505  SLP Time Calculation (min) (ACUTE ONLY) 15 min  SLP Evaluations  $ SLP Speech Visit 1 Visit  SLP Evaluations  $Swallowing Treatment 1 Procedure     SLP Note populated on behalf of Consuelo Fort, SLP  Leita SAILOR., M.A. CCC-SLP Acute Rehabilitation Services Office: [phone]  Secure chat preferred

## 2024-01-28 NOTE — Anesthesia Postprocedure Evaluation (Signed)
 Anesthesia Post Note  Patient: [name]  Procedure(s) Performed: GASTRECTOMY, TOTAL CREATION, JEJUNOSTOMY LAPAROSCOPY, DIAGNOSTIC PARTIAL ESOPHAGECTOMY, ROBOT-ASSISTED (Right: Chest) BLOCK, NERVE, INTERCOSTAL (Right: Chest)     Patient location during evaluation: PACU Anesthesia Type: General Level of consciousness: awake and alert Pain management: pain level controlled Vital Signs Assessment: post-procedure vital signs reviewed and stable Respiratory status: spontaneous breathing, nonlabored ventilation, respiratory function stable and patient connected to nasal cannula oxygen Cardiovascular status: blood pressure returned to baseline and stable Postop Assessment: no apparent nausea or vomiting Anesthetic complications: no   There were no known notable events for this encounter.  Last Vitals:  Vitals:   01/28/24 0800 01/28/24 0942  BP:  132/72  Pulse:  96  Resp:    Temp: (!) 36.2 C   SpO2:      Last Pain:  Vitals:   01/28/24 0800  TempSrc: Axillary  PainSc:                  Obera Stauch S

## 2024-01-28 NOTE — Progress Notes (Signed)
 Speech Language Pathology Treatment: Dysphagia  Patient Details Name: [name] MRN: [id number] DOB: 1955/06/18 Today's Date: 01/28/2024 Time: 8896-8888 SLP Time Calculation (min) (ACUTE ONLY): 8 min  Assessment / Plan / Recommendation Clinical Impression  Pt has no subjective c/o difficulty swallowing since starting clear liquid diet. He drank water  while SLP was present, with no overt signs of aspiration even when drinking consecutively. Nursing also reports no overt difficulty since swallow eval on previous date. Recommend continuing clear liquid diet, with any further diet advancement to be made per MD. SLP will follow at least briefly.   HPI HPI: [name] is a 68 yo male with gastric cardia adenocarcinoma, POD12 s/p total gastrectomy, with distal esophagectomy and roux-en-Y esophagojejunostomy on 9/3.  POD11 s/p takeback by thoracic surgery for RATS repair of anastomotic leak on 9/4.  POD4 s/p EGD with placement of esophageal stent on 9/11. Intubated 9/3 and 9/4, respectively, for surgery, then again 9/11-9/12.      SLP Plan  Continue with current plan of care          Recommendations  Diet recommendations: Thin liquid Medication Administration: Via alternative means Supervision: Patient able to self feed Compensations: Slow rate;Small sips/bites Postural Changes and/or Swallow Maneuvers: Seated upright 90 degrees;Upright 30-60 min after meal                  Oral care BID     Dysphagia, unspecified (R13.10)     Continue with current plan of care     Leita SAILOR., M.A. CCC-SLP Acute Rehabilitation Services Office: [phone]  Secure chat preferred   01/28/2024, 11:31 AM

## 2024-01-28 NOTE — Progress Notes (Signed)
     [address]       [name] [address]             [phone]       Some delirium overnight NG clamped, but reasonable CT output  Will remove argyle chest tube today Will go home with blake drain Ok for liquid diet from my standpoint  Shaelyn Decarli MALVA Rayas

## 2024-01-29 ENCOUNTER — Other Ambulatory Visit: Payer: Self-pay

## 2024-01-29 LAB — BASIC METABOLIC PANEL WITH GFR
Anion gap: 17 — ABNORMAL HIGH (ref 5–15)
BUN: 22 mg/dL (ref 8–23)
CO2: 26 mmol/L (ref 22–32)
Calcium: 8.6 mg/dL — ABNORMAL LOW (ref 8.9–10.3)
Chloride: 102 mmol/L (ref 98–111)
Creatinine, Ser: 0.73 mg/dL (ref 0.61–1.24)
GFR, Estimated: >60 mL/min (ref >60)
Glucose, Bld: 194 mg/dL — ABNORMAL HIGH (ref 70–99)
Potassium: 3.6 mmol/L (ref 3.5–5.1)
Sodium: 145 mmol/L (ref 135–145)

## 2024-01-29 LAB — CBC
HCT: 23.2 % — ABNORMAL LOW (ref 39.0–52.0)
Hemoglobin: 7.5 g/dL — ABNORMAL LOW (ref 13.0–17.0)
MCH: 32.9 pg (ref 26.0–34.0)
MCHC: 32.3 g/dL (ref 30.0–36.0)
MCV: 101.8 fL — ABNORMAL HIGH (ref 80.0–100.0)
Platelets: 593 K/uL — ABNORMAL HIGH (ref 150–400)
RBC: 2.28 MIL/uL — ABNORMAL LOW (ref 4.22–5.81)
RDW: 14.5 % (ref 11.5–15.5)
WBC: 20 K/uL — ABNORMAL HIGH (ref 4.0–10.5)
nRBC: 0 % (ref 0.0–0.2)

## 2024-01-29 LAB — TYPE AND SCREEN
ABO/RH(D): A POS
Antibody Screen: NEGATIVE
Unit division: 0

## 2024-01-29 LAB — GLUCOSE, CAPILLARY
Glucose-Capillary: 147 mg/dL — ABNORMAL HIGH (ref 70–99)
Glucose-Capillary: 153 mg/dL — ABNORMAL HIGH (ref 70–99)
Glucose-Capillary: 174 mg/dL — ABNORMAL HIGH (ref 70–99)
Glucose-Capillary: 175 mg/dL — ABNORMAL HIGH (ref 70–99)
Glucose-Capillary: 187 mg/dL — ABNORMAL HIGH (ref 70–99)
Glucose-Capillary: 190 mg/dL — ABNORMAL HIGH (ref 70–99)
Glucose-Capillary: 237 mg/dL — ABNORMAL HIGH (ref 70–99)

## 2024-01-29 LAB — BPAM RBC
Blood Product Expiration Date: 202510102359
ISSUE DATE / TIME: 202509161308
Unit Type and Rh: 6200

## 2024-01-29 MED ORDER — FREE WATER
100.0000 mL | Status: DC
Start: 2024-01-29 — End: 2024-01-30
  Administered 2024-01-29 – 2024-01-30 (×5): 100 mL

## 2024-01-29 MED ORDER — ENOXAPARIN SODIUM 40 MG/0.4ML IJ SOSY
40.0000 mg | PREFILLED_SYRINGE | INTRAMUSCULAR | Status: DC
Start: 2024-01-29 — End: 2024-02-05
  Administered 2024-01-29 – 2024-02-04 (×7): 40 mg via SUBCUTANEOUS
  Filled 2024-01-29 (×7): qty 0.4

## 2024-01-29 MED ORDER — INSULIN ASPART 100 UNIT/ML IJ SOLN
2.0000 [IU] | INTRAMUSCULAR | Status: DC
  Administered 2024-01-29 – 2024-02-01 (×21): 2 [IU] via SUBCUTANEOUS

## 2024-01-29 NOTE — Progress Notes (Signed)
 6 Days Post-Op  Subjective: Seems less agitated this morning. Answering questions appropriately. WBC slowly downtrending. Hgb 7.5 after transfusion of PRBCs yesterday.  Objective: Vital signs in last 24 hours: Temp:  [96.8 F (36 C)-98.4 F (36.9 C)] 97.9 F (36.6 C) (09/17 0313) Pulse Rate:  [70-99] 97 (09/17 0313) Resp:  [16-30] 24 (09/17 0313) BP: (111-151)/(55-90) 144/82 (09/17 0313) SpO2:  [94 %-100 %] 98 % (09/17 0313) Last BM Date : 01/28/24  Intake/Output from previous day: 09/16 0701 - 09/17 0700 In: 2223.7 [P.O.:420; I.V.:10.7; Blood:283; NG/GT:1260; IV Piggyback:250] Out: 3725 [Urine:3150; Drains:175; Stool:400] Intake/Output this shift: No intake/output data recorded.  PE: General: resting in bed, NAD Neuro: oriented Resp: nonlabored respirations on room air. Pleural JP with purulent fluid. CV: RRR Abdomen: very soft and nondistended. Upper midline incision clean and dry. Prior drain site in RUQ is clean and dry. J tube with feeds running at 60 ml/hr.    Lab Results:  Recent Labs    01/28/24 0830 01/29/24 0500  WBC 22.2* 20.0*  HGB 6.7* 7.5*  HCT 21.6* 23.2*  PLT 565* 593*   BMET Recent Labs    01/28/24 0830 01/29/24 0500  NA 144 145  K 3.8 3.6  CL 106 102  CO2 31 26  GLUCOSE 209* 194*  BUN 34* 22  CREATININE 0.80 0.73  CALCIUM  8.6* 8.6*   PT/INR No results for input(s): LABPROT, INR in the last 72 hours. CMP     Component Value Date/Time   NA 145 01/29/2024 0500   K 3.6 01/29/2024 0500   CL 102 01/29/2024 0500   CO2 26 01/29/2024 0500   GLUCOSE 194 (H) 01/29/2024 0500   BUN 22 01/29/2024 0500   CREATININE 0.73 01/29/2024 0500   CREATININE 0.85 10/10/2023 0824   CALCIUM  8.6 (L) 01/29/2024 0500   PROT 6.7 01/10/2024 1130   ALBUMIN  1.9 (L) 01/23/2024 0326   AST 17 01/10/2024 1130   AST 15 10/10/2023 0824   ALT 11 01/10/2024 1130   ALT 11 10/10/2023 0824   ALKPHOS 32 (L) 01/10/2024 1130   BILITOT 0.6 01/10/2024 1130    BILITOT 0.5 10/10/2023 0824   GFRNONAA >60 01/29/2024 0500   GFRNONAA >60 10/10/2023 0824   GFRAA  10/16/2007 1246    >60        The eGFR has been calculated using the MDRD equation. This calculation has not been validated in all clinical   Lipase  No results found for: LIPASE   Assessment/Plan  [name] is a 68 yo male with gastric cardia adenocarcinoma, POD14 s/p total gastrectomy, with distal esophagectomy and roux-en-Y esophagojejunostomy on 9/3. POD13 s/p takeback by thoracic surgery for RATS repair of anastomotic leak on 9/4. POD6 s/p EGD with placement of esophageal stent on 9/11. - EJ anastomotic leak: S/p esophageal stent placement. Continue Zosyn  and fluconazole  for at least 7 days from stent placement. WBC slowly downtrending. Clear liquids, will remain on liquid diet while stent is in place per thoracic. Pleural JP to remain in place. - Pain control: PRN oxycodone  and morphine , scheduled tylenol . - T2DM: on sliding scale novolog . Home metformin on hold. Consult diabetes coordinator today, may benefit from adding basal insulin  while on tube feeds. - A-fib with RVR: Converted to sinus rhythm 9/9, remains in sinus. Diltiazem  and metoprolol  per J tube. - Delirium: Seems improved today. Continue Seroquel  50mg  QHS. Crush up and give by mouth with liquid or applesauce, do not give per J tube. Continue PRN haldol  (did  not need any prn doses overnight). - FEN: clear liquid diet. Continue J tube feeds at goal. Hypernatremia resolved. Stool output decreased with Banatrol and imodium . - VTE: prophylactic lovenox , therapeutic anticoagulation on hold for a-fib given anemia - Dispo: progressive care. PT recommending SNF at discharge, however patient states he wants to go home. Will see how he progresses but I do not feel he is currently safe to be discharged home. Will be discharged on J tube feeds.    LOS: 14 days    Jon LITTIE Dawn, MD [facility] Surgery General, Hepatobiliary  and Pancreatic Surgery 01/29/24 8:18 AM

## 2024-01-29 NOTE — Inpatient Diabetes Management (Signed)
 Inpatient Diabetes Program Recommendations  AACE/ADA: New Consensus Statement on Inpatient Glycemic Control (2015)  Target Ranges:  Prepandial:   less than 140 mg/dL      Peak postprandial:   less than 180 mg/dL (1-2 hours)      Critically ill patients:  140 - 180 mg/dL   Lab Results  Component Value Date   GLUCAP 187 (H) 01/29/2024   HGBA1C 5.7 (H) 02/09/2021    Review of Glycemic Control  Latest Reference Range & Units 01/28/24 20:01 01/29/24 00:45 01/29/24 03:28 01/29/24 08:14  Glucose-Capillary 70 - 99 mg/dL 845 (H) 824 (H) 825 (H) 187 (H)  (H): Data is abnormally high Diabetes history: Type 2 DM Outpatient Diabetes medications: Metformin 500 mg QD Current orders for Inpatient glycemic control: Novolog  0-15 units Q4H  Inpatient Diabetes Program Recommendations:    Consider adding Novolog  2 units Q4H for tube feed coverage (to be stopped or held in the event tube feeds stopped).  Thanks, Tinnie Minus, MSN, RNC-OB Diabetes Coordinator [phone] (8a-5p)

## 2024-01-29 NOTE — Progress Notes (Addendum)
 6 Days Post-Op Procedure(s) (LRB): EGD (ESOPHAGOGASTRODUODENOSCOPY) (N/A) INSERTION, STENT, ESOPHAGUS (N/A) Subjective: Looks and feels a bit stronger  Objective: Vital signs in last 24 hours: Temp:  [96.8 F (36 C)-98.4 F (36.9 C)] 97.9 F (36.6 C) (09/17 0313) Pulse Rate:  [70-99] 97 (09/17 0313) Cardiac Rhythm: Sinus tachycardia (09/17 0700) Resp:  [16-30] 24 (09/17 0313) BP: (111-151)/(55-90) 144/82 (09/17 0313) SpO2:  [94 %-100 %] 98 % (09/17 0313)  Hemodynamic parameters for last 24 hours:    Intake/Output from previous day: 09/16 0701 - 09/17 0700 In: 2223.7 [P.O.:420; I.V.:10.7; Blood:283; NG/GT:1260; IV Piggyback:250] Out: 3725 [Urine:3150; Drains:175; Stool:400] Intake/Output this shift: No intake/output data recorded.  General appearance: alert, cooperative, and no distress Heart: regular rate and rhythm Lungs: mildly dim in bases Abdomen: non distended Wound: incis healing well  Lab Results: Recent Labs    01/28/24 0830 01/29/24 0500  WBC 22.2* 20.0*  HGB 6.7* 7.5*  HCT 21.6* 23.2*  PLT 565* 593*   BMET:  Recent Labs    01/28/24 0830 01/29/24 0500  NA 144 145  K 3.8 3.6  CL 106 102  CO2 31 26  GLUCOSE 209* 194*  BUN 34* 22  CREATININE 0.80 0.73  CALCIUM  8.6* 8.6*    PT/INR: No results for input(s): LABPROT, INR in the last 72 hours. ABG    Component Value Date/Time   PHART 7.362 01/23/2024 1416   HCO3 26.0 01/23/2024 1416   TCO2 27 01/23/2024 1416   ACIDBASEDEF 2.0 01/15/2024 1459   O2SAT 89 01/23/2024 1416   CBG (last 3)  Recent Labs    01/28/24 2001 01/29/24 0045 01/29/24 0328  GLUCAP 154* 175* 174*    Meds Scheduled Meds:  acetaminophen  (TYLENOL ) oral liquid 160 mg/5 mL  1,000 mg Per J Tube QID   Chlorhexidine  Gluconate Cloth  6 each Topical Daily   diltiazem   30 mg Per Tube Q6H   feeding supplement (PROSource TF20)  60 mL Per Tube BID   fiber supplement (BANATROL TF)  60 mL Per Tube BID   free water   100 mL Per  Tube Q2H   insulin  aspart  0-15 Units Subcutaneous Q4H   loperamide  HCl  2 mg Per J Tube TID   metoprolol  tartrate  50 mg Per J Tube BID   multivitamin  15 mL Per Tube Daily   mouth rinse  15 mL Mouth Rinse 4 times per day   QUEtiapine   50 mg Oral QHS   sodium chloride  flush  10-40 mL Intracatheter Q12H   Continuous Infusions:  feeding supplement (VITAL 1.5 CAL) 60 mL/hr at 01/28/24 1500   fluconazole  (DIFLUCAN ) IV Stopped (01/28/24 1213)   piperacillin -tazobactam (ZOSYN )  IV 3.375 g (01/29/24 0153)   PRN Meds:.albuterol , diphenhydrAMINE , haloperidol  lactate, hydrALAZINE , metoprolol  tartrate, morphine  injection, ondansetron  (ZOFRAN ) IV, mouth rinse, oxyCODONE , sodium chloride  flush   Recent Results (from the past 240 hours)  Culture, blood (Routine X 2) w Reflex to ID Panel     Status: None   Collection Time: 01/23/24  5:39 AM   Specimen: BLOOD RIGHT HAND  Result Value Ref Range Status   Specimen Description BLOOD RIGHT HAND  Final   Special Requests   Final    BOTTLES DRAWN AEROBIC AND ANAEROBIC Blood Culture results may not be optimal due to an inadequate volume of blood received in culture bottles   Culture   Final    NO GROWTH 5 DAYS Performed at [facility], [address], [place], [place] [address]  Report Status 01/28/2024 FINAL  Final  Culture, blood (Routine X 2) w Reflex to ID Panel     Status: None   Collection Time: 01/23/24  5:41 AM   Specimen: BLOOD LEFT HAND  Result Value Ref Range Status   Specimen Description BLOOD LEFT HAND  Final   Special Requests   Final    BOTTLES DRAWN AEROBIC AND ANAEROBIC Blood Culture results may not be optimal due to an inadequate volume of blood received in culture bottles   Culture   Final    NO GROWTH 5 DAYS Performed at [facility], [address] [place], [place] [address]    Report Status 01/28/2024 FINAL  Final    Xrays No results found.  Assessment/Plan: S/P Procedure(s) (LRB): EGD  (ESOPHAGOGASTRODUODENOSCOPY) (N/A) INSERTION, STENT, ESOPHAGUS (N/A)  1 afeb, sinus rhythm/tachy- s BP 110's-150's 2 O2 sats good on RA 3 NGT 1260 ml/24 h recorded, Chest bulb 175 ml/25 h- still w/ some purulence 4 excellent UOP 5 normal renal fxn 6 leukocytosis trend improving- remains on diflucan  and zosyn , anemia improved after transfusion yesterday 7 cont tube feeds 8 no change in current management from CT surgical perspective, cont primary management per Gen Surgery    LOS: 14 days    Lemond FORBES Cera PA-C pager [phone] 01/29/2024   Agree Continue CT management Will plan to assess leak 6 weeks out from stent placement  Darcia Lampi O Kyannah Climer

## 2024-01-29 NOTE — Progress Notes (Signed)
 Physical Therapy Treatment Patient Details Name: Thaxton Pelley MRN: [id number] DOB: [birth year] Today's Date: 01/29/2024   History of Present Illness 68 y.o. male presents to [facility] hospital on 01/15/2024 with gastric CA. Pt underwent total gastrectomy with omentectomy and D2 lymphadenectomy, esophagojejunostomy with Jtube placement, splenic biopsy on 9/3. Pt developed bilious output into chest tube on 9/4, returned to OR for EGD and thoracoscopy with application of wound matrix to EJ anastomosis. ETT 9/11-9/12 for desats and unresponsiveness. S/p EGD and esophageal stent placement 9/11. PMH includes asthma, OA, COPD, GERD, DMII, MDD, emphysema.    PT Comments  Patient very lethargic, although would answer some questions appropriately. Required incr assist to initiate bed mobility, but then assisted with side to sit. Encouraged participation with explaining role of PT/OT with regards to returning home, however this was not extremely effective in getting pt to engage in activities. Required max tactile cues to initiate sit to stand and then stood with +2 min assist. Ambulated to bathroom as pt insisted he needed to sit on toilet (not understanding he has rectal pouch). Stood from toilet +2 min assist and ambulated back to recliner +2  min assist for balance and maneuvering RW.    If plan is discharge home, recommend the following: Assistance with cooking/housework;Assist for transportation;Help with stairs or ramp for entrance;Direct supervision/assist for medications management;Direct supervision/assist for financial management;Supervision due to cognitive status;A lot of help with bathing/dressing/bathroom;Two people to help with walking and/or transfers   Can travel by private vehicle        Equipment Recommendations  Other (comment) (tbd)    Recommendations for Other Services       Precautions / Restrictions Precautions Precautions: Fall Recall of Precautions/Restrictions:  Impaired Precaution/Restrictions Comments: R JP drain x 1, J tube, fecal management system Restrictions Weight Bearing Restrictions Per Provider Order: No     Mobility  Bed Mobility Overal bed mobility: Needs Assistance Bed Mobility: Rolling, Sit to Sidelying Rolling: Mod assist, +2 for physical assistance, +2 for safety/equipment Sidelying to sit: Min assist, +2 for physical assistance, +2 for safety/equipment, Used rails       General bed mobility comments: increased assist needed due to lethargy and decreased participation    Transfers Overall transfer level: Needs assistance Equipment used: Rolling walker (2 wheels) Transfers: Sit to/from Stand Sit to Stand: Min assist, +2 physical assistance, +2 safety/equipment           General transfer comment: min A of 2 for line management, hand placement, and to steady, max encouragement to participate    Ambulation/Gait Ambulation/Gait assistance: +2 safety/equipment, Min assist, +2 physical assistance Gait Distance (Feet): 12 Feet (toileted, 12) Assistive device: Rolling walker (2 wheels) Gait Pattern/deviations: Step-through pattern, Decreased stride length, Trunk flexed, Knee flexed in stance - right, Knee flexed in stance - left Gait velocity: decr     General Gait Details: assist to steady, guide RW   Stairs             Wheelchair Mobility     Tilt Bed    Modified Rankin (Stroke Patients Only)       Balance Overall balance assessment: Needs assistance Sitting-balance support: Feet supported, Bilateral upper extremity supported Sitting balance-Leahy Scale: Fair Sitting balance - Comments: can sit EOB with CGA once balance is gained   Standing balance support: Bilateral upper extremity supported, During functional activity, Reliant on assistive device for balance Standing balance-Leahy Scale: Poor  Communication Communication Communication: Impaired Factors  Affecting Communication: Reduced clarity of speech  Cognition Arousal: Lethargic Behavior During Therapy: Flat affect, Agitated   PT - Cognitive impairments: Difficult to assess Difficult to assess due to: Level of arousal                     PT - Cognition Comments: poor awareness of safety, cues for safety throughout mobility. Following commands: Impaired Following commands impaired: Follows one step commands with increased time    Cueing Cueing Techniques: Verbal cues, Tactile cues  Exercises      General Comments General comments (skin integrity, edema, etc.): VSS on RA      Pertinent Vitals/Pain Pain Assessment Pain Assessment: Faces Faces Pain Scale: Hurts a little bit Pain Location: back Pain Descriptors / Indicators: Sore, Discomfort, Grimacing, Guarding Pain Intervention(s): Limited activity within patient's tolerance, Monitored during session, Repositioned    Home Living                          Prior Function            PT Goals (current goals can now be found in the care plan section) Acute Rehab PT Goals Patient Stated Goal: to return to independence Time For Goal Achievement: 01/31/24 Potential to Achieve Goals: Fair Progress towards PT goals: Progressing toward goals    Frequency    Min 2X/week      PT Plan      Co-evaluation PT/OT/SLP Co-Evaluation/Treatment: Yes Reason for Co-Treatment: Necessary to address cognition/behavior during functional activity;For patient/therapist safety;To address functional/ADL transfers          AM-PAC PT 6 Clicks Mobility   Outcome Measure  Help needed turning from your back to your side while in a flat bed without using bedrails?: Total Help needed moving from lying on your back to sitting on the side of a flat bed without using bedrails?: Total Help needed moving to and from a bed to a chair (including a wheelchair)?: Total Help needed standing up from a chair using your arms (e.g.,  wheelchair or bedside chair)?: Total Help needed to walk in hospital room?: Total Help needed climbing 3-5 steps with a railing? : Total 6 Click Score: 6    End of Session Equipment Utilized During Treatment: Gait belt Activity Tolerance: Patient limited by lethargy Patient left: with call bell/phone within reach;in chair;with chair alarm set Nurse Communication: Mobility status PT Visit Diagnosis: Other abnormalities of gait and mobility (R26.89);Muscle weakness (generalized) (M62.81);Pain Pain - part of body:  (back)     Time: 9098-9072 PT Time Calculation (min) (ACUTE ONLY): 26 min  Charges:    $Gait Training: 8-22 mins PT General Charges $$ ACUTE PT VISIT: 1 Visit                      [name], PT Acute Rehabilitation Services  Office [phone]    [name] 01/29/2024, 11:55 AM

## 2024-01-29 NOTE — Progress Notes (Signed)
 Occupational Therapy Treatment Patient Details Name: [name] MRN: [id number] DOB: Dec 23, 1955 Today's Date: 01/29/2024   History of present illness 68 y.o. male presents to [facility] hospital on 01/15/2024 with gastric CA. Pt underwent total gastrectomy with omentectomy and D2 lymphadenectomy, esophagojejunostomy with Jtube placement, splenic biopsy on 9/3. Pt developed bilious output into chest tube on 9/4, returned to OR for EGD and thoracoscopy with application of wound matrix to EJ anastomosis. ETT 9/11-9/12 for desats and unresponsiveness. S/p EGD and esophageal stent placement 9/11. PMH includes asthma, OA, COPD, GERD, DMII, MDD, emphysema.   OT comments  Patient seen in conjunction with PT in order to advance with functional mobility. Patient oriented, though lethargic eyes remaining closed for the majority of the session and initially agreeable to OOB. Patient swatting at PT and OT when attempting OOB movements, requiring increased assist to roll, but then able to stand with min A of 2. Patient then unaware of flexi-seal system, and wanting to sit on commode. Patient able to stand with max encouragement, but min A of 2 solely due to low surface. Patient in recliner at end of session. OT recommendation remains appropriate, will continue to follow.       If plan is discharge home, recommend the following:  A lot of help with walking and/or transfers;Two people to help with walking and/or transfers;A lot of help with bathing/dressing/bathroom;Two people to help with bathing/dressing/bathroom;Assistance with cooking/housework;Assist for transportation;Help with stairs or ramp for entrance   Equipment Recommendations  Other (comment) (defer to next venue)    Recommendations for Other Services      Precautions / Restrictions Precautions Precautions: Fall Recall of Precautions/Restrictions: Impaired Precaution/Restrictions Comments: R JP drain x 1, J tube, fecal management  system Restrictions Weight Bearing Restrictions Per Provider Order: No       Mobility Bed Mobility Overal bed mobility: Needs Assistance Bed Mobility: Rolling, Sit to Sidelying Rolling: Mod assist, +2 for physical assistance, +2 for safety/equipment Sidelying to sit: Min assist, +2 for physical assistance, +2 for safety/equipment, Used rails       General bed mobility comments: increased assist needed due to lethargy and decreased participation    Transfers Overall transfer level: Needs assistance Equipment used: Rolling walker (2 wheels) Transfers: Sit to/from Stand Sit to Stand: Min assist, +2 physical assistance, +2 safety/equipment           General transfer comment: min A of 2 for line management, hand placement, and to steady, max encouragement to participate     Balance Overall balance assessment: Needs assistance Sitting-balance support: Feet supported, Bilateral upper extremity supported Sitting balance-Leahy Scale: Fair     Standing balance support: Bilateral upper extremity supported, During functional activity, Reliant on assistive device for balance Standing balance-Leahy Scale: Poor                             ADL either performed or assessed with clinical judgement   ADL       Grooming: Wash/dry hands;Wash/dry face;Set up;Sitting                   Toilet Transfer: Minimal assistance;+2 for safety/equipment;+2 for physical assistance           Functional mobility during ADLs: Minimal assistance;+2 for physical assistance;+2 for safety/equipment General ADL Comments: Patient seen in conjunction with PT in order to advance with functional mobility. Patient oriented, though lethargic eyes remaining closed for the majority of the session and  initially agreeable to OOB. Patient swatting at PT and OT when attempting OOB movements, requiring increased assist to roll, but then able to stand with min A of 2. Patient then unaware of  flexi-seal system, and wanting to sit on commode. Patient able to stand with max encouragement, but min A of 2 solely due to low surface. Patient in recliner at end of session. OT recommendation remains appropriate, will continue to follow.    Extremity/Trunk Assessment              Occupational psychologist Communication: Impaired Factors Affecting Communication: Reduced clarity of speech   Cognition Arousal: Lethargic Behavior During Therapy: Flat affect, Agitated Cognition: No family/caregiver present to determine baseline, Cognition impaired   Orientation impairments: Situation Awareness: Online awareness impaired Memory impairment (select all impairments): Short-term memory, Declarative long-term memory, Working memory Attention impairment (select first level of impairment): Sustained attention Executive functioning impairment (select all impairments): Initiation, Organization, Sequencing, Problem solving OT - Cognition Comments: Agitated throughout session, requiring max cues to participate, could not comprehend he has a flexi-seal but did participate to hit on the toilet                 Following commands: Impaired Following commands impaired: Follows one step commands with increased time      Cueing   Cueing Techniques: Verbal cues, Tactile cues  Exercises      Shoulder Instructions       General Comments VSS on RA    Pertinent Vitals/ Pain       Pain Assessment Pain Assessment: Faces Faces Pain Scale: Hurts a little bit Pain Location: back Pain Descriptors / Indicators: Sore, Discomfort, Grimacing, Guarding Pain Intervention(s): Limited activity within patient's tolerance, Monitored during session, Repositioned  Home Living                                          Prior Functioning/Environment              Frequency  Min 2X/week        Progress Toward Goals  OT  Goals(current goals can now be found in the care plan section)  Progress towards OT goals: Progressing toward goals  Acute Rehab OT Goals Patient Stated Goal: to lay down OT Goal Formulation: Patient unable to participate in goal setting Time For Goal Achievement: 02/08/24 Potential to Achieve Goals: Fair  Plan      Co-evaluation                 AM-PAC OT 6 Clicks Daily Activity     Outcome Measure   Help from another person eating meals?: A Little Help from another person taking care of personal grooming?: A Little Help from another person toileting, which includes using toliet, bedpan, or urinal?: A Lot Help from another person bathing (including washing, rinsing, drying)?: A Lot Help from another person to put on and taking off regular upper body clothing?: A Lot Help from another person to put on and taking off regular lower body clothing?: A Lot 6 Click Score: 14    End of Session Equipment Utilized During Treatment: Gait belt;Rolling walker (2 wheels)  OT Visit Diagnosis: Unsteadiness on feet (R26.81);Muscle weakness (generalized) (M62.81);Other symptoms and signs involving cognitive function;Pain   Activity Tolerance Patient limited by  lethargy   Patient Left in chair;with call bell/phone within reach;with chair alarm set   Nurse Communication Mobility status        Time: 9098-9072 OT Time Calculation (min): 26 min  Charges: OT General Charges $OT Visit: 1 Visit OT Treatments $Self Care/Home Management : 8-22 mins  Ronal Gift E. Ravleen Ries, OTR/L Acute Rehabilitation Services [phone]   Ronal Gift Salt 01/29/2024, 10:39 AM

## 2024-01-30 DIAGNOSIS — Z9889 Other specified postprocedural states: Secondary | ICD-10-CM

## 2024-01-30 LAB — GLUCOSE, CAPILLARY
Glucose-Capillary: 153 mg/dL — ABNORMAL HIGH (ref 70–99)
Glucose-Capillary: 153 mg/dL — ABNORMAL HIGH (ref 70–99)
Glucose-Capillary: 172 mg/dL — ABNORMAL HIGH (ref 70–99)
Glucose-Capillary: 176 mg/dL — ABNORMAL HIGH (ref 70–99)
Glucose-Capillary: 178 mg/dL — ABNORMAL HIGH (ref 70–99)
Glucose-Capillary: 184 mg/dL — ABNORMAL HIGH (ref 70–99)

## 2024-01-30 LAB — CBC
HCT: 23.6 % — ABNORMAL LOW (ref 39.0–52.0)
Hemoglobin: 7.6 g/dL — ABNORMAL LOW (ref 13.0–17.0)
MCH: 32.6 pg (ref 26.0–34.0)
MCHC: 32.2 g/dL (ref 30.0–36.0)
MCV: 101.3 fL — ABNORMAL HIGH (ref 80.0–100.0)
Platelets: 632 K/uL — ABNORMAL HIGH (ref 150–400)
RBC: 2.33 MIL/uL — ABNORMAL LOW (ref 4.22–5.81)
RDW: 14 % (ref 11.5–15.5)
WBC: 20.9 K/uL — ABNORMAL HIGH (ref 4.0–10.5)
nRBC: 0 % (ref 0.0–0.2)

## 2024-01-30 LAB — BASIC METABOLIC PANEL WITH GFR
Anion gap: 10 (ref 5–15)
BUN: 18 mg/dL (ref 8–23)
CO2: 31 mmol/L (ref 22–32)
Calcium: 8.6 mg/dL — ABNORMAL LOW (ref 8.9–10.3)
Chloride: 96 mmol/L — ABNORMAL LOW (ref 98–111)
Creatinine, Ser: 0.79 mg/dL (ref 0.61–1.24)
GFR, Estimated: >60 mL/min (ref >60)
Glucose, Bld: 159 mg/dL — ABNORMAL HIGH (ref 70–99)
Potassium: 3.9 mmol/L (ref 3.5–5.1)
Sodium: 137 mmol/L (ref 135–145)

## 2024-01-30 MED ORDER — OXYCODONE HCL 5 MG/5ML PO SOLN: 5.0000 mg | ORAL | Status: DC | PRN

## 2024-01-30 MED ORDER — FREE WATER
50.0000 mL | Status: DC
Start: 2024-01-30 — End: 2024-02-02
  Administered 2024-01-30 – 2024-02-01 (×16): 50 mL

## 2024-01-30 MED ORDER — LOPERAMIDE HCL 1 MG/7.5ML PO SUSP
2.0000 mg | Freq: Two times a day (BID) | ORAL | Status: DC
  Administered 2024-01-30 – 2024-02-01 (×6): 2 mg via JEJUNOSTOMY
  Filled 2024-01-30 (×6): qty 15

## 2024-01-30 NOTE — Progress Notes (Signed)
 7 Days Post-Op Procedure(s) (LRB): EGD (ESOPHAGOGASTRODUODENOSCOPY) (N/A) INSERTION, STENT, ESOPHAGUS (N/A) Subjective: Confused and lethargic  Objective: Vital signs in last 24 hours: Temp:  [97.6 F (36.4 C)-98.6 F (37 C)] 97.9 F (36.6 C) (09/18 0318) Pulse Rate:  [76-96] 96 (09/18 0318) Cardiac Rhythm: Sinus tachycardia (09/18 0700) Resp:  [15-21] 20 (09/18 0318) BP: (119-156)/(68-94) 156/90 (09/18 0318) SpO2:  [98 %-100 %] 100 % (09/18 0318)  Hemodynamic parameters for last 24 hours:    Intake/Output from previous day: 09/17 0701 - 09/18 0700 In: 840 [P.O.:240; NG/GT:600] Out: 2730 [Urine:2300; Drains:230; Stool:200] Intake/Output this shift: No intake/output data recorded.  General appearance: slowed mentation Heart: regular rate and rhythm Lungs: coarse in upper airways Abdomen: soft, non distended Extremities: no edema or calf tenderness Wound: incis healing well  Lab Results: Recent Labs    01/29/24 0500 01/30/24 0330  WBC 20.0* 20.9*  HGB 7.5* 7.6*  HCT 23.2* 23.6*  PLT 593* 632*   BMET:  Recent Labs    01/29/24 0500 01/30/24 0330  NA 145 137  K 3.6 3.9  CL 102 96*  CO2 26 31  GLUCOSE 194* 159*  BUN 22 18  CREATININE 0.73 0.79  CALCIUM  8.6* 8.6*    PT/INR: No results for input(s): LABPROT, INR in the last 72 hours. ABG    Component Value Date/Time   PHART 7.362 01/23/2024 1416   HCO3 26.0 01/23/2024 1416   TCO2 27 01/23/2024 1416   ACIDBASEDEF 2.0 01/15/2024 1459   O2SAT 89 01/23/2024 1416   CBG (last 3)  Recent Labs    01/29/24 2320 01/30/24 0326 01/30/24 0803  GLUCAP 190* 153* 176*    Meds Scheduled Meds:  acetaminophen  (TYLENOL ) oral liquid 160 mg/5 mL  1,000 mg Per J Tube QID   Chlorhexidine  Gluconate Cloth  6 each Topical Daily   diltiazem   30 mg Per Tube Q6H   enoxaparin  (LOVENOX ) injection  40 mg Subcutaneous Q24H   feeding supplement (PROSource TF20)  60 mL Per Tube BID   fiber supplement (BANATROL TF)  60 mL  Per Tube BID   free water   50 mL Per Tube Q4H   insulin  aspart  0-15 Units Subcutaneous Q4H   insulin  aspart  2 Units Subcutaneous Q4H   loperamide  HCl  2 mg Per J Tube BID   metoprolol  tartrate  50 mg Per J Tube BID   multivitamin  15 mL Per Tube Daily   mouth rinse  15 mL Mouth Rinse 4 times per day   QUEtiapine   50 mg Oral QHS   sodium chloride  flush  10-40 mL Intracatheter Q12H   Continuous Infusions:  feeding supplement (VITAL 1.5 CAL) 1,000 mL (01/30/24 0138)   fluconazole  (DIFLUCAN ) IV 400 mg (01/29/24 0844)   piperacillin -tazobactam (ZOSYN )  IV 3.375 g (01/30/24 0135)   PRN Meds:.albuterol , diphenhydrAMINE , haloperidol  lactate, ondansetron  (ZOFRAN ) IV, mouth rinse, oxyCODONE , sodium chloride  flush  Xrays No results found.  Assessment/Plan: S/P Procedure(s) (LRB): EGD (ESOPHAGOGASTRODUODENOSCOPY) (N/A) INSERTION, STENT, ESOPHAGUS (N/A)  1 afeb, SR/ST, s BP 110's-150's, delirium remains an issue- meds being adjusted  2 O2 sats good on RA 3 Chest bulb drain 230 ml/24 h- yellow purulence, remains on current abx zosyn /diflucan  4 conts tube feeds/clears 5 normal renal fxn, leukocytosis stable w/ WBC 20K, H/H stable 7.6/23.6 6 no changes in current management from CT surgery perspective    LOS: 15 days    Sherina Stammer E Antavia Tandy PA-C 01/30/2024

## 2024-01-30 NOTE — Progress Notes (Signed)
 Speech Language Pathology Treatment: Dysphagia  Patient Details Name: [name] MRN: [id number] DOB: [birth year] Today's Date: 01/30/2024 Time: 8888-8878 SLP Time Calculation (min) (ACUTE ONLY): 10 min  Assessment / Plan / Recommendation Clinical Impression  Pt awake, but speech is slurred, snoring respirations. Pt is able to follow commands without an focal oral weakness observed. Pt has consistent congested coughing following sips of water . Given documentation of delirium, suspect pts swallow mechanism (timing of airway protection?) may be impacted as well. MD has already documented lethargy and delirium and has made some adjustments to pain medication. Recommend pt remain NPO given risk of aspiration while altered. SLP will f/u tomorrow for trials, and if needed, a FEES instrumental assessment. Pt cannot have barium per surgeon.   HPI HPI: [name] is a 68 yo male with gastric cardia adenocarcinoma, POD12 s/p total gastrectomy, with distal esophagectomy and roux-en-Y esophagojejunostomy on 9/3.  POD11 s/p takeback by thoracic surgery for RATS repair of anastomotic leak on 9/4.  POD4 s/p EGD with placement of esophageal stent on 9/11. Intubated 9/3 and 9/4, respectively, for surgery, then again 9/11-9/12.      SLP Plan  Continue with current plan of care          Recommendations  Diet recommendations: NPO Medication Administration: Via alternative means                  Oral care BID     Dysphagia, unspecified (R13.10)     Continue with current plan of care     [name], [name]  01/30/2024, 11:29 AM

## 2024-01-30 NOTE — Progress Notes (Deleted)
 Patient made NPO except sips with meds at midnight. Patient and sitter made aware of status.

## 2024-01-30 NOTE — Progress Notes (Signed)
 7 Days Post-Op  Subjective: Resting comfortably this morning. Seems disoriented but is not agitated. WBC stable at 20.  Objective: Vital signs in last 24 hours: Temp:  [97.6 F (36.4 C)-98.6 F (37 C)] 97.9 F (36.6 C) (09/18 0318) Pulse Rate:  [76-96] 96 (09/18 0318) Resp:  [15-21] 20 (09/18 0318) BP: (119-160)/(68-94) 156/90 (09/18 0318) SpO2:  [98 %-100 %] 100 % (09/18 0318) Last BM Date : 01/29/24  Intake/Output from previous day: 09/17 0701 - 09/18 0700 In: 840 [P.O.:240; NG/GT:600] Out: 2730 [Urine:2300; Drains:230; Stool:200] Intake/Output this shift: No intake/output data recorded.  PE: General: resting in bed, NAD Neuro: mildly confused Resp: nonlabored respirations on room air. Pleural JP with purulent fluid. CV: RRR Abdomen: very soft and nondistended. Upper midline incision clean and dry. Prior drain site in RUQ is clean and dry. J tube with feeds running at 60 ml/hr.    Lab Results:  Recent Labs    01/29/24 0500 01/30/24 0330  WBC 20.0* 20.9*  HGB 7.5* 7.6*  HCT 23.2* 23.6*  PLT 593* 632*   BMET Recent Labs    01/29/24 0500 01/30/24 0330  NA 145 137  K 3.6 3.9  CL 102 96*  CO2 26 31  GLUCOSE 194* 159*  BUN 22 18  CREATININE 0.73 0.79  CALCIUM  8.6* 8.6*   PT/INR No results for input(s): LABPROT, INR in the last 72 hours. CMP     Component Value Date/Time   NA 137 01/30/2024 0330   K 3.9 01/30/2024 0330   CL 96 (L) 01/30/2024 0330   CO2 31 01/30/2024 0330   GLUCOSE 159 (H) 01/30/2024 0330   BUN 18 01/30/2024 0330   CREATININE 0.79 01/30/2024 0330   CREATININE 0.85 10/10/2023 0824   CALCIUM  8.6 (L) 01/30/2024 0330   PROT 6.7 01/10/2024 1130   ALBUMIN  1.9 (L) 01/23/2024 0326   AST 17 01/10/2024 1130   AST 15 10/10/2023 0824   ALT 11 01/10/2024 1130   ALT 11 10/10/2023 0824   ALKPHOS 32 (L) 01/10/2024 1130   BILITOT 0.6 01/10/2024 1130   BILITOT 0.5 10/10/2023 0824   GFRNONAA >60 01/30/2024 0330   GFRNONAA >60  10/10/2023 0824   GFRAA  10/16/2007 1246    >60        The eGFR has been calculated using the MDRD equation. This calculation has not been validated in all clinical   Lipase  No results found for: LIPASE   Assessment/Plan  Mr. Alicea is a 68 yo male with gastric cardia adenocarcinoma, POD15 s/p total gastrectomy, with distal esophagectomy and roux-en-Y esophagojejunostomy on 9/3. POD14 s/p takeback by thoracic surgery for RATS repair of anastomotic leak on 9/4. POD7 s/p EGD with placement of esophageal stent on 9/11. - EJ anastomotic leak: S/p esophageal stent placement. Continue Zosyn  and fluconazole  for at least 7 days from stent placement. Clear liquids, will remain on liquid diet while stent is in place per thoracic. Pleural JP to remain in place. - Pain control: Decrease oxycodone  dose as patient has been lethargic during the day. Continue scheduled tylenol . Discontinue morphine . - T2DM: on sliding scale novolog . Home metformin on hold. Diabetes coordinator consulted, added Novolog  2 units q4h while on tube feeds. - A-fib with RVR: Converted to sinus rhythm 9/9, remains in sinus. Diltiazem  and metoprolol  per J tube. - Delirium:  Continue Seroquel  50mg  QHS. Crush up and give by mouth with liquid or applesauce, do not give per J tube. PRN haldol  (did not need any prn  doses overnight). - FEN: clear liquid diet. Continue J tube feeds at goal, will discuss compressing to 18 hours with nutrition. Hypernatremia resolved, decreasing free water  flushes. Stool output decreased, will decrease Imodium  to BID. Continue banatrol. - VTE: prophylactic lovenox , therapeutic anticoagulation on hold for a-fib given anemia - Dispo: progressive care. PT recommending SNF at discharge, however patient states he wants to go home. Not currently safe for discharge home, will see how he progresses. Will be discharged on J tube feeds.    LOS: 15 days    [name], MD [facility] Surgery General,  Hepatobiliary and Pancreatic Surgery 01/30/24 7:21 AM

## 2024-01-31 ENCOUNTER — Encounter: Payer: Self-pay | Admitting: Hematology

## 2024-01-31 ENCOUNTER — Inpatient Hospital Stay (HOSPITAL_COMMUNITY)

## 2024-01-31 LAB — BASIC METABOLIC PANEL WITH GFR
Anion gap: 14 (ref 5–15)
BUN: 17 mg/dL (ref 8–23)
CO2: 26 mmol/L (ref 22–32)
Calcium: 8.8 mg/dL — ABNORMAL LOW (ref 8.9–10.3)
Chloride: 98 mmol/L (ref 98–111)
Creatinine, Ser: 0.62 mg/dL (ref 0.61–1.24)
GFR, Estimated: >60 mL/min (ref >60)
Glucose, Bld: 195 mg/dL — ABNORMAL HIGH (ref 70–99)
Potassium: 4.2 mmol/L (ref 3.5–5.1)
Sodium: 138 mmol/L (ref 135–145)

## 2024-01-31 LAB — CBC
HCT: 24.2 % — ABNORMAL LOW (ref 39.0–52.0)
Hemoglobin: 7.8 g/dL — ABNORMAL LOW (ref 13.0–17.0)
MCH: 32.4 pg (ref 26.0–34.0)
MCHC: 32.2 g/dL (ref 30.0–36.0)
MCV: 100.4 fL — ABNORMAL HIGH (ref 80.0–100.0)
Platelets: 721 K/uL — ABNORMAL HIGH (ref 150–400)
RBC: 2.41 MIL/uL — ABNORMAL LOW (ref 4.22–5.81)
RDW: 13.8 % (ref 11.5–15.5)
WBC: 25.7 K/uL — ABNORMAL HIGH (ref 4.0–10.5)
nRBC: 0 % (ref 0.0–0.2)

## 2024-01-31 LAB — URINALYSIS, ROUTINE W REFLEX MICROSCOPIC
Bilirubin Urine: NEGATIVE
Glucose, UA: NEGATIVE mg/dL
Hgb urine dipstick: NEGATIVE
Ketones, ur: NEGATIVE mg/dL
Leukocytes,Ua: NEGATIVE
Nitrite: NEGATIVE
Protein, ur: NEGATIVE mg/dL
Specific Gravity, Urine: 1.041 — ABNORMAL HIGH (ref 1.005–1.030)
pH: 6 (ref 5.0–8.0)

## 2024-01-31 LAB — GLUCOSE, CAPILLARY
Glucose-Capillary: 133 mg/dL — ABNORMAL HIGH (ref 70–99)
Glucose-Capillary: 185 mg/dL — ABNORMAL HIGH (ref 70–99)
Glucose-Capillary: 177 mg/dL — ABNORMAL HIGH (ref 70–99)
Glucose-Capillary: 136 mg/dL — ABNORMAL HIGH (ref 70–99)
Glucose-Capillary: 157 mg/dL — ABNORMAL HIGH (ref 70–99)

## 2024-01-31 MED ORDER — OXYCODONE HCL 5 MG PO TABS: 5.0000 mg | ORAL_TABLET | Freq: Four times a day (QID) | ORAL | Status: DC | PRN

## 2024-01-31 MED ORDER — FLUCONAZOLE IN SODIUM CHLORIDE 400-0.9 MG/200ML-% IV SOLN
400.0000 mg | INTRAVENOUS | Status: DC
  Administered 2024-01-31 – 2024-02-01 (×2): 400 mg via INTRAVENOUS
  Filled 2024-01-31 (×2): qty 200

## 2024-01-31 MED ORDER — PIPERACILLIN-TAZOBACTAM 3.375 G IVPB
3.3750 g | Freq: Three times a day (TID) | INTRAVENOUS | Status: DC
  Administered 2024-01-31 – 2024-02-01 (×4): 3.375 g via INTRAVENOUS
  Filled 2024-01-31 (×4): qty 50

## 2024-01-31 MED ORDER — IOHEXOL 350 MG/ML SOLN
75.0000 mL | Freq: Once | INTRAVENOUS | Status: AC | PRN
Start: 2024-01-31 — End: 2024-01-31
  Administered 2024-01-31: 75 mL via INTRAVENOUS

## 2024-01-31 NOTE — Progress Notes (Addendum)
 8 Days Post-Op  Subjective: Remains lethargic this morning. Oriented to place. WBC up to 25, afebrile.  Objective: Vital signs in last 24 hours: Temp:  [97.6 F (36.4 C)-98.4 F (36.9 C)] 97.6 F (36.4 C) (09/19 0254) Pulse Rate:  [85-102] 95 (09/19 0254) Resp:  [16-24] 22 (09/19 0254) BP: (127-155)/(81-90) 142/90 (09/19 0254) SpO2:  [95 %-100 %] 99 % (09/19 0254) Last BM Date : 01/30/24  Intake/Output from previous day: 09/18 0701 - 09/19 0700 In: 310 [I.V.:10; NG/GT:300] Out: 2495 [Urine:2250; Drains:245] Intake/Output this shift: No intake/output data recorded.  PE: General: resting in bed, NAD Neuro: mildly confused Resp: nonlabored respirations on room air. Pleural JP with purulent fluid. CV: RRR Abdomen: very soft and nondistended. Upper midline incision clean and dry. Prior drain site in RUQ is clean and dry. J tube with feeds running at 60 ml/hr.    Lab Results:  Recent Labs    01/30/24 0330 01/31/24 0510  WBC 20.9* 25.7*  HGB 7.6* 7.8*  HCT 23.6* 24.2*  PLT 632* 721*   BMET Recent Labs    01/30/24 0330 01/31/24 0510  NA 137 138  K 3.9 4.2  CL 96* 98  CO2 31 26  GLUCOSE 159* 195*  BUN 18 17  CREATININE 0.79 0.62  CALCIUM  8.6* 8.8*   PT/INR No results for input(s): LABPROT, INR in the last 72 hours. CMP     Component Value Date/Time   NA 138 01/31/2024 0510   K 4.2 01/31/2024 0510   CL 98 01/31/2024 0510   CO2 26 01/31/2024 0510   GLUCOSE 195 (H) 01/31/2024 0510   BUN 17 01/31/2024 0510   CREATININE 0.62 01/31/2024 0510   CREATININE 0.85 10/10/2023 0824   CALCIUM  8.8 (L) 01/31/2024 0510   PROT 6.7 01/10/2024 1130   ALBUMIN  1.9 (L) 01/23/2024 0326   AST 17 01/10/2024 1130   AST 15 10/10/2023 0824   ALT 11 01/10/2024 1130   ALT 11 10/10/2023 0824   ALKPHOS 32 (L) 01/10/2024 1130   BILITOT 0.6 01/10/2024 1130   BILITOT 0.5 10/10/2023 0824   GFRNONAA >60 01/31/2024 0510   GFRNONAA >60 10/10/2023 0824   GFRAA  10/16/2007  1246    >60        The eGFR has been calculated using the MDRD equation. This calculation has not been validated in all clinical   Lipase  No results found for: LIPASE   Assessment/Plan  [name] is a 68 yo male with gastric cardia adenocarcinoma, POD16 s/p total gastrectomy, with distal esophagectomy and roux-en-Y esophagojejunostomy on 9/3. POD15 s/p takeback by thoracic surgery for RATS repair of anastomotic leak on 9/4. POD8 s/p EGD with placement of esophageal stent on 9/11. - EJ anastomotic leak: S/p esophageal stent placement. Completed 7 days Zosyn /fluconazole  yesterday. Pleural JP to remain in place. - Pain control: tylenol  only. Will discontinue all narcotics given delirium and lethargy. Patient did not require any oxycodone  in last 24 hours. - T2DM: on sliding scale novolog  and 2u q4h with tube feeds. Home metformin on hold. Diabetes coordinator consulted. - A-fib with RVR: Converted to sinus rhythm 9/9, remains in sinus. Diltiazem  and metoprolol  per J tube. - Delirium:  Continue Seroquel  50mg  QHS. Crush up and give by mouth with liquid or applesauce, do not give per J tube. Given worsening leukocytosis and persistent symptoms, will initiate infectious workup. UA, pending, CXR does not show any large effusions this morning. Will proceed with CT chest/abd/pelvis. - FEN: clear liquid diet.  Continue J tube feeds at goal. Diarrhea improved on imodium  and banatrol. - VTE: prophylactic lovenox , therapeutic anticoagulation on hold for a-fib given anemia - Dispo: progressive care. PT recommending SNF at discharge, however patient states he wants to go home. Not currently safe for discharge home. Will be discharged on J tube feeds.  I updated patient's sister via phone this morning on the patient's clinical status and plan of care.   LOS: 16 days    [name], MD [facility] Surgery General, Hepatobiliary and Pancreatic Surgery 01/31/24 7:09 AM

## 2024-01-31 NOTE — Progress Notes (Addendum)
 Pt MEWS turned yellow at 0750 (see flowsheet), scheduled meds administered. See new orders per Dr. Dasie from rounds.

## 2024-01-31 NOTE — Progress Notes (Signed)
 SLP Cancellation Note  Patient Details Name: [name] MRN: [id number] DOB: [birth year]   Cancelled treatment:       Reason Eval/Treat Not Completed: Other (comment). Pt has CT abdomen pending and remains lethargic. Unable to complete FEES today. Will f/u.    Sophiana Milanese, Consuelo Fitch 01/31/2024, 8:31 AM

## 2024-01-31 NOTE — Progress Notes (Signed)
 Pt off the unit to CT.

## 2024-01-31 NOTE — Progress Notes (Signed)
 Physical Therapy Treatment Patient Details Name: [name] MRN: [id number] DOB: 09-Mar-1956 Today's Date: 01/31/2024   History of Present Illness 68 y.o. male [facility] to [facility] hospital on 01/15/2024 with gastric CA. Pt underwent total gastrectomy with omentectomy and D2 lymphadenectomy, esophagojejunostomy with Jtube placement, splenic biopsy on 9/3. Pt developed bilious output into chest tube on 9/4, returned to OR for EGD and thoracoscopy with application of wound matrix to EJ anastomosis. ETT 9/11-9/12 for desats and unresponsiveness. S/p EGD and esophageal stent placement 9/11. PMH includes asthma, OA, COPD, GERD, DMII, MDD, emphysema.    PT Comments  Pt with sonorous breath sounds, but awake. PT unable to understand any verbalizations beyond yes/no this date, has a delirious presentation. Pt requiring increased physical assist for mobility today, requiring mod-max +2 assist for bed mobility and transfer into standing. Unable to progress to gait given weakness and difficulty with command following. Pt likely will need post-acute rehab.     If plan is discharge home, recommend the following: Assistance with cooking/housework;Assist for transportation;Help with stairs or ramp for entrance;Direct supervision/assist for medications management;Direct supervision/assist for financial management;Supervision due to cognitive status;A lot of help with bathing/dressing/bathroom;Two people to help with walking and/or transfers   Can travel by private vehicle        Equipment Recommendations  Other (comment) (tbd)    Recommendations for Other Services       Precautions / Restrictions Precautions Precautions: Fall Recall of Precautions/Restrictions: Impaired Precaution/Restrictions Comments: R JP drain x 1, J tube, fecal management system Restrictions Weight Bearing Restrictions Per Provider Order: No     Mobility  Bed Mobility Overal bed mobility: Needs Assistance Bed Mobility: Rolling,  Sidelying to Sit, Sit to Sidelying Rolling: Mod assist Sidelying to sit: Mod assist, +2 for physical assistance     Sit to sidelying: Max assist, +2 for physical assistance General bed mobility comments: assist for trunk and LE management, scooting to/from EOB, truncal support to gain balance    Transfers Overall transfer level: Needs assistance Equipment used: Rolling walker (2 wheels) Transfers: Sit to/from Stand Sit to Stand: Max assist, +2 physical assistance, From elevated surface           General transfer comment: assist for power up, rise, steadying, use of bed pad to initiate power up. Stand x1, pt too weak to reattempt    Ambulation/Gait                   Stairs             Wheelchair Mobility     Tilt Bed    Modified Rankin (Stroke Patients Only)       Balance Overall balance assessment: Needs assistance Sitting-balance support: Feet supported, Bilateral upper extremity supported Sitting balance-Leahy Scale: Poor Sitting balance - Comments: posterior bias requiring min truncal assist throughout Postural control: Posterior lean Standing balance support: Bilateral upper extremity supported, During functional activity, Reliant on assistive device for balance Standing balance-Leahy Scale: Poor Standing balance comment: reliant on +2 and RW                            Communication Communication Communication: Impaired Factors Affecting Communication: Reduced clarity of speech  Cognition Arousal: Lethargic Behavior During Therapy: Flat affect, Restless   PT - Cognitive impairments: Difficult to assess Difficult to assess due to: Level of arousal  PT - Cognition Comments: very difficult to understand, lethargic, appears delirious Following commands: Impaired Following commands impaired: Follows one step commands with increased time    Cueing Cueing Techniques: Verbal cues, Tactile cues  Exercises       General Comments General comments (skin integrity, edema, etc.): RRmax observed 40 breaths/min, VSS otherwise      Pertinent Vitals/Pain Pain Assessment Pain Assessment: Faces Faces Pain Scale: Hurts little more Pain Location: generalized Pain Descriptors / Indicators: Sore, Discomfort, Grimacing, Guarding Pain Intervention(s): Limited activity within patient's tolerance, Monitored during session, Repositioned    Home Living                          Prior Function            PT Goals (current goals can now be found in the care plan section) Acute Rehab PT Goals Patient Stated Goal: to return to independence PT Goal Formulation: With patient Time For Goal Achievement: 02/14/24 Potential to Achieve Goals: Fair Progress towards PT goals: Not progressing toward goals - comment (lethargy, delirious presentation)    Frequency    Min 2X/week      PT Plan      Co-evaluation              AM-PAC PT 6 Clicks Mobility   Outcome Measure  Help needed turning from your back to your side while in a flat bed without using bedrails?: A Lot Help needed moving from lying on your back to sitting on the side of a flat bed without using bedrails?: A Lot Help needed moving to and from a bed to a chair (including a wheelchair)?: A Lot Help needed standing up from a chair using your arms (e.g., wheelchair or bedside chair)?: Total Help needed to walk in hospital room?: Total Help needed climbing 3-5 steps with a railing? : Total 6 Click Score: 9    End of Session   Activity Tolerance: Patient limited by lethargy;Other (comment) (delirious presentation) Patient left: with call bell/phone within reach;in bed;with bed alarm set Nurse Communication: Mobility status PT Visit Diagnosis: Other abnormalities of gait and mobility (R26.89);Muscle weakness (generalized) (M62.81);Pain     Time: 9062-9040 PT Time Calculation (min) (ACUTE ONLY): 22 min  Charges:     $Therapeutic Activity: 8-22 mins PT General Charges $$ ACUTE PT VISIT: 1 Visit                     [name], PT DPT Acute Rehabilitation Services Secure Chat Preferred  Office [phone]    Demetrius Mahler E Johna 01/31/2024, 10:52 AM

## 2024-01-31 NOTE — Progress Notes (Addendum)
        Dr Velasquez has reviewed the CT scan done today.  Currently there are no plans to do anything different in terms of testing or procedures.  We do recommend continuing IV antibiotic coverage for 6 weeks, specifically gram-negative and fungal coverage.  We will continue to follow along with you.  Jon FORBES Cera, [name]     CT chest reviewed Expected fluid collection in the posterior gutter.  Drain is positioned appropriately.  Good coverage with stent, as output is not bilious.  Continue abx and antifungals for at least 6 weeks.  [name]

## 2024-02-01 ENCOUNTER — Inpatient Hospital Stay (HOSPITAL_COMMUNITY)

## 2024-02-01 LAB — GLUCOSE, CAPILLARY
Glucose-Capillary: 125 mg/dL — ABNORMAL HIGH (ref 70–99)
Glucose-Capillary: 156 mg/dL — ABNORMAL HIGH (ref 70–99)
Glucose-Capillary: 164 mg/dL — ABNORMAL HIGH (ref 70–99)
Glucose-Capillary: 172 mg/dL — ABNORMAL HIGH (ref 70–99)
Glucose-Capillary: 173 mg/dL — ABNORMAL HIGH (ref 70–99)
Glucose-Capillary: 208 mg/dL — ABNORMAL HIGH (ref 70–99)
Glucose-Capillary: 218 mg/dL — ABNORMAL HIGH (ref 70–99)

## 2024-02-01 MED ORDER — NOREPINEPHRINE 4 MG/250ML-% IV SOLN
INTRAVENOUS | Status: AC
  Filled 2024-02-01: qty 250

## 2024-02-01 MED ORDER — LACTATED RINGERS IV BOLUS
1000.0000 mL | Freq: Once | INTRAVENOUS | Status: AC
  Administered 2024-02-02: 1000 mL via INTRAVENOUS

## 2024-02-01 MED ORDER — VASOPRESSIN 20 UNITS/100 ML INFUSION FOR SHOCK
0.0000 [IU]/min | INTRAVENOUS | Status: DC
  Administered 2024-02-02 (×4): 0.04 [IU]/min via INTRAVENOUS
  Filled 2024-02-01 (×4): qty 100

## 2024-02-01 NOTE — Progress Notes (Signed)
 9 Days Post-Op  Subjective: Remains AF and HDS, tachy to 120s. CT performed and shows expected post-operative findings. No additional interventions planned by CT surg at this time.  On exam, patient is resting comfortably. NAD. Knows he is in the hospital but remains confused.   Objective: Vital signs in last 24 hours: Temp:  [97.6 F (36.4 C)-99.5 F (37.5 C)] 98.2 F (36.8 C) (09/20 0755) Pulse Rate:  [91-121] 121 (09/20 0755) Resp:  [22-39] 22 (09/20 0755) BP: (118-160)/(56-84) 128/79 (09/20 0755) SpO2:  [96 %-97 %] 97 % (09/20 0755) Weight:  [75.3 kg] 75.3 kg (09/20 0500) Last BM Date : 01/31/24  Intake/Output from previous day: 09/19 0701 - 09/20 0700 In: 3666.8 [I.V.:10; NG/GT:3383.7; IV Piggyback:273.2] Out: 3900 [Urine:1800; Drains:100; Stool:2000] Intake/Output this shift: Total I/O In: 50 [NG/GT:50] Out: -   PE: General: resting in bed, NAD Neuro: mildly confused Resp: nonlabored respirations on room air. Pleural JP with purulent fluid. CV: RRR Abdomen: very soft and nondistended. Upper midline incision clean and dry. Prior drain site in RUQ is clean and dry. J tube with feeds running at 60 ml/hr.    Lab Results:  Recent Labs    01/30/24 0330 01/31/24 0510  WBC 20.9* 25.7*  HGB 7.6* 7.8*  HCT 23.6* 24.2*  PLT 632* 721*   BMET Recent Labs    01/30/24 0330 01/31/24 0510  NA 137 138  K 3.9 4.2  CL 96* 98  CO2 31 26  GLUCOSE 159* 195*  BUN 18 17  CREATININE 0.79 0.62  CALCIUM  8.6* 8.8*   PT/INR No results for input(s): LABPROT, INR in the last 72 hours. CMP     Component Value Date/Time   NA 138 01/31/2024 0510   K 4.2 01/31/2024 0510   CL 98 01/31/2024 0510   CO2 26 01/31/2024 0510   GLUCOSE 195 (H) 01/31/2024 0510   BUN 17 01/31/2024 0510   CREATININE 0.62 01/31/2024 0510   CREATININE 0.85 10/10/2023 0824   CALCIUM  8.8 (L) 01/31/2024 0510   PROT 6.7 01/10/2024 1130   ALBUMIN  1.9 (L) 01/23/2024 0326   AST 17 01/10/2024 1130    AST 15 10/10/2023 0824   ALT 11 01/10/2024 1130   ALT 11 10/10/2023 0824   ALKPHOS 32 (L) 01/10/2024 1130   BILITOT 0.6 01/10/2024 1130   BILITOT 0.5 10/10/2023 0824   GFRNONAA >60 01/31/2024 0510   GFRNONAA >60 10/10/2023 0824   GFRAA  10/16/2007 1246    >60        The eGFR has been calculated using the MDRD equation. This calculation has not been validated in all clinical   Lipase  No results found for: LIPASE   Assessment/Plan  [name] is a 68 yo male with gastric cardia adenocarcinoma, POD17 s/p total gastrectomy, with distal esophagectomy and roux-en-Y esophagojejunostomy on 9/3. POD16 s/p takeback by thoracic surgery for RATS repair of anastomotic leak on 9/4. POD9 s/p EGD with placement of esophageal stent on 9/11. - EJ anastomotic leak: S/p esophageal stent placement. Per CT surg, continue abx/anti-fungal for planned 6 weeks. Pleural JP to remain in place. - Pain control: tylenol  only. Will discontinue all narcotics given delirium and lethargy. Patient did not require any oxycodone  in last 24 hours. - T2DM: on sliding scale novolog  and 2u q4h with tube feeds. Home metformin on hold. Diabetes coordinator consulted. - A-fib with RVR: Converted to sinus rhythm 9/9, remains in sinus. Diltiazem  and metoprolol  per J tube. - Delirium:  Continue Seroquel   50mg  QHS. Crush up and give by mouth with liquid or applesauce, do not give per J tube.  - FEN: clear liquid diet. Continue J tube feeds at goal. Diarrhea improved on imodium  and banatrol. - VTE: prophylactic lovenox , therapeutic anticoagulation on hold for a-fib given anemia - Dispo: progressive care. PT recommending SNF at discharge, however patient states he wants to go home. Not currently safe for discharge home. Will be discharged on J tube feeds.  CT 9/19 with expected findings. No sign of leak. No plan for additional procedures from CT surg.    LOS: 17 days    Jon DELENA Idler, MD [facility] Surgery General,  Hepatobiliary and Pancreatic Surgery 02/01/24 10:12 AM

## 2024-02-01 NOTE — Progress Notes (Addendum)
 Pt was desalting. Probe changed. Pt continued to be desalting. Pt also diaphoresis. Set of vital signs obtained SBP=]113/73, MAP86,O2 94, Nonbreathing and O2 administered. Will continue monitoring. CBG=]173. RAPID notified. Pt on nonbreathig  15 L of mO2

## 2024-02-01 NOTE — Progress Notes (Signed)
 Speech Language Pathology Treatment: Dysphagia  Patient Details Name: [name] MRN: [id number] DOB: 10/30/1955 Today's Date: 02/01/2024 Time: 8571-8547 SLP Time Calculation (min) (ACUTE ONLY): 24 min  Assessment / Plan / Recommendation Clinical Impression  Pt lethargic but when sat upright for POs, became adequately alert and was eager to consume ice chips. Mouth breathing/snoring respirations persist. He actively accepted ice chips x4 with no clinical signs of aspiration noted, however he did exhibit some poor attention which resulted in oral holding/possible delay in swallow initiation. Sips of thin liquids by cup/straw were consumed impulsively. Even with control for smaller sips, pt exhibited consistent immediate congested coughing with thins. Once PO trials were terminated, pt quickly fell asleep. Mentation and level alertness/attention continue to impact swallowing abilities. Recommend continue NPO with allowance of ice chips PRN after oral care when pt is alert and sitting upright. Will f/u for PO trials and FEES instrumental assessment if still warranted. Educated family members present in room and sister over the phone [name]). All expressed understanding and agreement with recommendations.    HPI HPI: [name] is a 68 yo male with gastric cardia adenocarcinoma, POD12 s/p total gastrectomy, with distal esophagectomy and roux-en-Y esophagojejunostomy on 9/3.  POD11 s/p takeback by thoracic surgery for RATS repair of anastomotic leak on 9/4.  POD4 s/p EGD with placement of esophageal stent on 9/11. Intubated 9/3 and 9/4, respectively, for surgery, then again 9/11-9/12.      SLP Plan  Continue with current plan of care          Recommendations  Diet recommendations: NPO (ice chips PRN after oral care when alert) Liquids provided via: Teaspoon Medication Administration: Via alternative means Supervision: Staff to assist with self feeding Compensations: Minimize environmental  distractions;Slow rate;Small sips/bites Postural Changes and/or Swallow Maneuvers: Seated upright 90 degrees;Upright 30-60 min after meal                  Oral care BID   None Dysphagia, unspecified (R13.10)     Continue with current plan of care      Jon Kin, MA, CCC-SLP Acute Rehabilitation Services Office Number: [phone]  [name]  02/01/2024, 3:03 PM

## 2024-02-01 NOTE — Progress Notes (Signed)
 Pt having difficulty breathing despite been on 15 l O2 on nonbreathing. MD  and Respiratory notified. Code Blue initiated.  MD at bedside. Pt intubated on the unit and transferred to 4NICU.

## 2024-02-02 ENCOUNTER — Inpatient Hospital Stay (HOSPITAL_COMMUNITY)

## 2024-02-02 ENCOUNTER — Inpatient Hospital Stay (HOSPITAL_COMMUNITY): Admitting: Anesthesiology

## 2024-02-02 DIAGNOSIS — D649 Anemia, unspecified: Secondary | ICD-10-CM

## 2024-02-02 DIAGNOSIS — I469 Cardiac arrest, cause unspecified: Secondary | ICD-10-CM

## 2024-02-02 DIAGNOSIS — J9601 Acute respiratory failure with hypoxia: Secondary | ICD-10-CM

## 2024-02-02 DIAGNOSIS — A419 Sepsis, unspecified organism: Secondary | ICD-10-CM

## 2024-02-02 DIAGNOSIS — Z452 Encounter for adjustment and management of vascular access device: Secondary | ICD-10-CM

## 2024-02-02 DIAGNOSIS — D72829 Elevated white blood cell count, unspecified: Secondary | ICD-10-CM

## 2024-02-02 HISTORY — DX: Cardiac arrest, cause unspecified: I46.9

## 2024-02-02 LAB — PROTIME-INR
INR: 1.2 (ref 0.8–1.2)
Prothrombin Time: 15.7 s — ABNORMAL HIGH (ref 11.4–15.2)

## 2024-02-02 LAB — HEMOGLOBIN A1C
Hgb A1c MFr Bld: 5.3 % (ref 4.8–5.6)
Mean Plasma Glucose: 105.41 mg/dL

## 2024-02-02 LAB — CBC
HCT: 21.6 % — ABNORMAL LOW (ref 39.0–52.0)
HCT: 21.1 % — ABNORMAL LOW (ref 39.0–52.0)
HCT: 23.7 % — ABNORMAL LOW (ref 39.0–52.0)
Hemoglobin: 6.8 g/dL — CL (ref 13.0–17.0)
Hemoglobin: 6.6 g/dL — CL (ref 13.0–17.0)
Hemoglobin: 7.5 g/dL — ABNORMAL LOW (ref 13.0–17.0)
MCH: 33.2 pg (ref 26.0–34.0)
MCH: 32.4 pg (ref 26.0–34.0)
MCH: 30.7 pg (ref 26.0–34.0)
MCHC: 31.5 g/dL (ref 30.0–36.0)
MCHC: 31.3 g/dL (ref 30.0–36.0)
MCHC: 31.6 g/dL (ref 30.0–36.0)
MCV: 105.4 fL — ABNORMAL HIGH (ref 80.0–100.0)
MCV: 103.4 fL — ABNORMAL HIGH (ref 80.0–100.0)
MCV: 97.1 fL (ref 80.0–100.0)
Platelets: 721 K/uL — ABNORMAL HIGH (ref 150–400)
Platelets: 703 K/uL — ABNORMAL HIGH (ref 150–400)
Platelets: 608 K/uL — ABNORMAL HIGH (ref 150–400)
RBC: 2.05 MIL/uL — ABNORMAL LOW (ref 4.22–5.81)
RBC: 2.04 MIL/uL — ABNORMAL LOW (ref 4.22–5.81)
RBC: 2.44 MIL/uL — ABNORMAL LOW (ref 4.22–5.81)
RDW: 14 % (ref 11.5–15.5)
RDW: 14.3 % (ref 11.5–15.5)
RDW: 18.1 % — ABNORMAL HIGH (ref 11.5–15.5)
WBC: 32.1 K/uL — ABNORMAL HIGH (ref 4.0–10.5)
WBC: 33 K/uL — ABNORMAL HIGH (ref 4.0–10.5)
WBC: 34.9 K/uL — ABNORMAL HIGH (ref 4.0–10.5)
nRBC: 0.1 % (ref 0.0–0.2)
nRBC: 0 % (ref 0.0–0.2)
nRBC: 0 % (ref 0.0–0.2)

## 2024-02-02 LAB — BASIC METABOLIC PANEL WITH GFR
Anion gap: 8 (ref 5–15)
BUN: 33 mg/dL — ABNORMAL HIGH (ref 8–23)
CO2: 26 mmol/L (ref 22–32)
Calcium: 8.5 mg/dL — ABNORMAL LOW (ref 8.9–10.3)
Chloride: 105 mmol/L (ref 98–111)
Creatinine, Ser: 1.11 mg/dL (ref 0.61–1.24)
GFR, Estimated: >60 mL/min (ref >60)
Glucose, Bld: 116 mg/dL — ABNORMAL HIGH (ref 70–99)
Potassium: 3.9 mmol/L (ref 3.5–5.1)
Sodium: 139 mmol/L (ref 135–145)

## 2024-02-02 LAB — COMPREHENSIVE METABOLIC PANEL WITH GFR
ALT: 62 U/L — ABNORMAL HIGH (ref 0–44)
AST: 38 U/L (ref 15–41)
Albumin: <1.5 g/dL — ABNORMAL LOW (ref 3.5–5.0)
Alkaline Phosphatase: 66 U/L (ref 38–126)
Anion gap: 10 (ref 5–15)
BUN: 28 mg/dL — ABNORMAL HIGH (ref 8–23)
CO2: 24 mmol/L (ref 22–32)
Calcium: 8.1 mg/dL — ABNORMAL LOW (ref 8.9–10.3)
Chloride: 103 mmol/L (ref 98–111)
Creatinine, Ser: 1.17 mg/dL (ref 0.61–1.24)
GFR, Estimated: >60 mL/min (ref >60)
Glucose, Bld: 253 mg/dL — ABNORMAL HIGH (ref 70–99)
Potassium: 4.2 mmol/L (ref 3.5–5.1)
Sodium: 137 mmol/L (ref 135–145)
Total Bilirubin: 0.5 mg/dL (ref 0.0–1.2)
Total Protein: 5.6 g/dL — ABNORMAL LOW (ref 6.5–8.1)

## 2024-02-02 LAB — IRON AND TIBC
Iron: <10 ug/dL — ABNORMAL LOW (ref 45–182)
TIBC: 234 ug/dL — ABNORMAL LOW (ref 250–450)

## 2024-02-02 LAB — ECHOCARDIOGRAM COMPLETE
Height: 71 in
S' Lateral: 3.82 cm
Weight: 2656.1 [oz_av]

## 2024-02-02 LAB — TROPONIN I (HIGH SENSITIVITY): Troponin I (High Sensitivity): 10 ng/L (ref <18)

## 2024-02-02 LAB — POCT I-STAT 7, (LYTES, BLD GAS, ICA,H+H)
Acid-Base Excess: 0 mmol/L (ref 0.0–2.0)
Bicarbonate: 26.1 mmol/L (ref 20.0–28.0)
Calcium, Ion: 1.25 mmol/L (ref 1.15–1.40)
HCT: 35 % — ABNORMAL LOW (ref 39.0–52.0)
Hemoglobin: 11.9 g/dL — ABNORMAL LOW (ref 13.0–17.0)
O2 Saturation: 98 %
Patient temperature: 98.6
Potassium: 4.1 mmol/L (ref 3.5–5.1)
Sodium: 137 mmol/L (ref 135–145)
TCO2: 28 mmol/L (ref 22–32)
pCO2 arterial: 49 mmHg — ABNORMAL HIGH (ref 32–48)
pH, Arterial: 7.334 — ABNORMAL LOW (ref 7.35–7.45)
pO2, Arterial: 118 mmHg — ABNORMAL HIGH (ref 83–108)

## 2024-02-02 LAB — PREPARE RBC (CROSSMATCH)

## 2024-02-02 LAB — FOLATE: Folate: 15.1 ng/mL (ref >5.9)

## 2024-02-02 LAB — PROCALCITONIN: Procalcitonin: 0.57 ng/mL

## 2024-02-02 LAB — URINALYSIS, ROUTINE W REFLEX MICROSCOPIC
Bilirubin Urine: NEGATIVE
Glucose, UA: NEGATIVE mg/dL
Hgb urine dipstick: NEGATIVE
Ketones, ur: NEGATIVE mg/dL
Leukocytes,Ua: NEGATIVE
Nitrite: NEGATIVE
Protein, ur: 30 mg/dL — AB
Specific Gravity, Urine: 1.026 (ref 1.005–1.030)
pH: 5 (ref 5.0–8.0)

## 2024-02-02 LAB — PHOSPHORUS
Phosphorus: 7 mg/dL — ABNORMAL HIGH (ref 2.5–4.6)
Phosphorus: 4.6 mg/dL (ref 2.5–4.6)

## 2024-02-02 LAB — GLUCOSE, CAPILLARY
Glucose-Capillary: 208 mg/dL — ABNORMAL HIGH (ref 70–99)
Glucose-Capillary: 179 mg/dL — ABNORMAL HIGH (ref 70–99)
Glucose-Capillary: 103 mg/dL — ABNORMAL HIGH (ref 70–99)
Glucose-Capillary: 168 mg/dL — ABNORMAL HIGH (ref 70–99)
Glucose-Capillary: 163 mg/dL — ABNORMAL HIGH (ref 70–99)
Glucose-Capillary: 162 mg/dL — ABNORMAL HIGH (ref 70–99)
Glucose-Capillary: 145 mg/dL — ABNORMAL HIGH (ref 70–99)

## 2024-02-02 LAB — LACTIC ACID, PLASMA
Lactic Acid, Venous: 1.6 mmol/L (ref 0.5–1.9)
Lactic Acid, Venous: 2 mmol/L (ref 0.5–1.9)

## 2024-02-02 LAB — MAGNESIUM: Magnesium: 2.4 mg/dL (ref 1.7–2.4)

## 2024-02-02 LAB — VITAMIN B12: Vitamin B-12: 242 pg/mL (ref 180–914)

## 2024-02-02 LAB — FERRITIN: Ferritin: 130 ng/mL (ref 24–336)

## 2024-02-02 MED ORDER — MIDAZOLAM HCL 2 MG/2ML IJ SOLN
INTRAMUSCULAR | Status: AC
  Filled 2024-02-02: qty 2

## 2024-02-02 MED ORDER — SODIUM CHLORIDE 0.9 % IV SOLN
1000.0000 mg | Freq: Three times a day (TID) | INTRAVENOUS | Status: DC
  Administered 2024-02-02 – 2024-03-14 (×123): 1000 mg via INTRAVENOUS
  Filled 2024-02-02 (×127): qty 20

## 2024-02-02 MED ORDER — ALBUMIN HUMAN 25 % IV SOLN
12.5000 g | Freq: Once | INTRAVENOUS | Status: AC
  Administered 2024-02-02: 12.5 g via INTRAVENOUS
  Filled 2024-02-02: qty 50

## 2024-02-02 MED ORDER — VASOPRESSIN 20 UNITS/100 ML INFUSION FOR SHOCK
INTRAVENOUS | Status: AC
  Filled 2024-02-02: qty 100

## 2024-02-02 MED ORDER — FENTANYL CITRATE PF 50 MCG/ML IJ SOSY
PREFILLED_SYRINGE | INTRAMUSCULAR | Status: AC
  Administered 2024-02-02: 100 ug
  Filled 2024-02-02: qty 2

## 2024-02-02 MED ORDER — MIDAZOLAM HCL 2 MG/2ML IJ SOLN
1.0000 mg | INTRAMUSCULAR | Status: DC | PRN
  Administered 2024-02-02: 2 mg via INTRAVENOUS
  Filled 2024-02-02: qty 2

## 2024-02-02 MED ORDER — SODIUM CHLORIDE 0.9 % IV SOLN
100.0000 mg | Freq: Every day | INTRAVENOUS | Status: DC
  Administered 2024-02-02 – 2024-03-14 (×42): 100 mg via INTRAVENOUS
  Filled 2024-02-02 (×42): qty 5

## 2024-02-02 MED ORDER — VANCOMYCIN HCL IN DEXTROSE 1-5 GM/200ML-% IV SOLN
1000.0000 mg | Freq: Once | INTRAVENOUS | Status: AC
  Administered 2024-02-02: 1000 mg via INTRAVENOUS
  Filled 2024-02-02: qty 200

## 2024-02-02 MED ORDER — SODIUM BICARBONATE 8.4 % IV SOLN
INTRAVENOUS | Status: AC
  Filled 2024-02-02: qty 50

## 2024-02-02 MED ORDER — ACETAMINOPHEN 10 MG/ML IV SOLN
1000.0000 mg | Freq: Four times a day (QID) | INTRAVENOUS | Status: AC
Start: 2024-02-02 — End: 2024-02-03
  Administered 2024-02-02 – 2024-02-03 (×2): 1000 mg via INTRAVENOUS
  Filled 2024-02-02 (×2): qty 100

## 2024-02-02 MED ORDER — SODIUM BICARBONATE 8.4 % IV SOLN
100.0000 meq | Freq: Once | INTRAVENOUS | Status: AC
  Administered 2024-02-02: 100 meq via INTRAVENOUS
  Filled 2024-02-02: qty 50

## 2024-02-02 MED ORDER — EPINEPHRINE HCL 5 MG/250ML IV SOLN IN NS
0.5000 ug/min | INTRAVENOUS | Status: DC
Start: 2024-02-02 — End: 2024-02-02
  Administered 2024-02-02: 0.5 ug/min via INTRAVENOUS

## 2024-02-02 MED ORDER — SODIUM CHLORIDE 0.9 % IV SOLN: INTRAVENOUS | Status: DC | PRN

## 2024-02-02 MED ORDER — PANTOPRAZOLE SODIUM 40 MG IV SOLR
40.0000 mg | Freq: Two times a day (BID) | INTRAVENOUS | Status: DC
  Administered 2024-02-02 – 2024-02-03 (×3): 40 mg via INTRAVENOUS
  Filled 2024-02-02 (×3): qty 10

## 2024-02-02 MED ORDER — FENTANYL 2500MCG IN NS 250ML (10MCG/ML) PREMIX INFUSION
0.0000 ug/h | INTRAVENOUS | Status: DC
  Administered 2024-02-02: 125 ug/h via INTRAVENOUS
  Administered 2024-02-02: 50 ug/h via INTRAVENOUS
  Filled 2024-02-02 (×2): qty 250

## 2024-02-02 MED ORDER — POLYETHYLENE GLYCOL 3350 17 G PO PACK: 17.0000 g | PACK | Freq: Every day | ORAL | Status: DC

## 2024-02-02 MED ORDER — FENTANYL BOLUS VIA INFUSION
25.0000 ug | INTRAVENOUS | Status: DC | PRN
  Administered 2024-02-02: 100 ug via INTRAVENOUS
  Administered 2024-02-02: 50 ug via INTRAVENOUS
  Administered 2024-02-02: 100 ug via INTRAVENOUS
  Administered 2024-02-02 (×2): 25 ug via INTRAVENOUS
  Administered 2024-02-02: 50 ug via INTRAVENOUS
  Administered 2024-02-02: 25 ug via INTRAVENOUS
  Administered 2024-02-02: 50 ug via INTRAVENOUS
  Administered 2024-02-02: 25 ug via INTRAVENOUS

## 2024-02-02 MED ORDER — DEXMEDETOMIDINE HCL IN NACL 400 MCG/100ML IV SOLN
0.0000 ug/kg/h | INTRAVENOUS | Status: DC
  Administered 2024-02-02: 0.5 ug/kg/h via INTRAVENOUS
  Administered 2024-02-02: 0.4 ug/kg/h via INTRAVENOUS
  Administered 2024-02-03: 0.8 ug/kg/h via INTRAVENOUS
  Administered 2024-02-03: 0.7 ug/kg/h via INTRAVENOUS
  Administered 2024-02-03 – 2024-02-04 (×2): 0.8 ug/kg/h via INTRAVENOUS
  Administered 2024-02-05: 0.4 ug/kg/h via INTRAVENOUS
  Administered 2024-02-05: 0.2 ug/kg/h via INTRAVENOUS
  Administered 2024-02-06: 0.6 ug/kg/h via INTRAVENOUS
  Administered 2024-02-06 (×2): 0.8 ug/kg/h via INTRAVENOUS
  Administered 2024-02-07: 0.4 ug/kg/h via INTRAVENOUS
  Administered 2024-02-07 (×2): 1.2 ug/kg/h via INTRAVENOUS
  Administered 2024-02-07: 0.4 ug/kg/h via INTRAVENOUS
  Administered 2024-02-07: 1.2 ug/kg/h via INTRAVENOUS
  Administered 2024-02-08 (×2): 0.6 ug/kg/h via INTRAVENOUS
  Administered 2024-02-09 (×2): 0.7 ug/kg/h via INTRAVENOUS
  Administered 2024-02-10: 0.4 ug/kg/h via INTRAVENOUS
  Administered 2024-02-10: 0.7 ug/kg/h via INTRAVENOUS
  Administered 2024-02-11: 0.4 ug/kg/h via INTRAVENOUS
  Filled 2024-02-02: qty 200
  Filled 2024-02-02 (×23): qty 100

## 2024-02-02 MED ORDER — DOCUSATE SODIUM 50 MG/5ML PO LIQD: 100.0000 mg | Freq: Two times a day (BID) | ORAL | Status: DC

## 2024-02-02 MED ORDER — FENTANYL CITRATE PF 50 MCG/ML IJ SOSY: 50.0000 ug | PREFILLED_SYRINGE | INTRAMUSCULAR | Status: DC | PRN

## 2024-02-02 MED ORDER — ORAL CARE MOUTH RINSE
15.0000 mL | OROMUCOSAL | Status: DC
  Administered 2024-02-02 – 2024-02-03 (×17): 15 mL via OROMUCOSAL

## 2024-02-02 MED ORDER — SODIUM CHLORIDE 0.9% IV SOLUTION: Freq: Once | INTRAVENOUS | Status: AC

## 2024-02-02 MED ORDER — EPINEPHRINE HCL 5 MG/250ML IV SOLN IN NS
INTRAVENOUS | Status: AC
  Filled 2024-02-02: qty 250

## 2024-02-02 MED ORDER — FENTANYL CITRATE PF 50 MCG/ML IJ SOSY
50.0000 ug | PREFILLED_SYRINGE | INTRAMUSCULAR | Status: DC | PRN
  Administered 2024-02-02: 100 ug via INTRAVENOUS
  Administered 2024-02-02: 150 ug via INTRAVENOUS
  Filled 2024-02-02: qty 2
  Filled 2024-02-02: qty 3

## 2024-02-02 MED ORDER — ACETAMINOPHEN 10 MG/ML IV SOLN
1000.0000 mg | Freq: Four times a day (QID) | INTRAVENOUS | Status: DC
  Administered 2024-02-02 (×2): 1000 mg via INTRAVENOUS
  Filled 2024-02-02 (×2): qty 100

## 2024-02-02 MED ORDER — FENTANYL CITRATE PF 50 MCG/ML IJ SOSY: 25.0000 ug | PREFILLED_SYRINGE | Freq: Once | INTRAMUSCULAR | Status: DC

## 2024-02-02 MED ORDER — NOREPINEPHRINE 4 MG/250ML-% IV SOLN
0.0000 ug/min | INTRAVENOUS | Status: DC
  Administered 2024-02-02: 2 ug/min via INTRAVENOUS
  Administered 2024-02-02: 40 ug/min via INTRAVENOUS
  Administered 2024-02-02: 16 ug/min via INTRAVENOUS
  Administered 2024-02-02: 17 ug/min via INTRAVENOUS
  Filled 2024-02-02 (×3): qty 250

## 2024-02-02 MED ORDER — LACTATED RINGERS IV SOLN: INTRAVENOUS | Status: DC

## 2024-02-02 NOTE — Progress Notes (Signed)
 10 Days Post-Op  Subjective: Suffered cardiac arrest overnight. ROSC achieved but he has been on 3 pressors and has become febrile. Discussion with family last night by CCM and patient transitioned to DNR.  Objective: Vital signs in last 24 hours: Temp:  [96.2 F (35.7 C)-101.6 F (38.7 C)] 101.4 F (38.6 C) (09/21 0815) Pulse Rate:  [84-162] 106 (09/21 0830) Resp:  [17-44] 21 (09/21 0830) BP: (59-154)/(34-99) 105/62 (09/21 0830) SpO2:  [92 %-100 %] 99 % (09/21 0830) FiO2 (%):  [60 %-100 %] 60 % (09/21 0819) Last BM Date : 02/01/24  Intake/Output from previous day: 09/20 0701 - 09/21 0700 In: 2335.7 [I.V.:558.5; NG/GT:1004; IV Piggyback:773.2] Out: 1570 [Urine:1450; Drains:70; Stool:50] Intake/Output this shift: Total I/O In: 0  Out: 107 [Urine:47; Drains:60]  PE: General: intubated, sedated Neuro: sedated Resp: mechanical ventilation, equal chest rise CV: HR 110s Abdomen: soft and nondistended. Upper midline incision clean and dry. Prior drain site in RUQ is clean and dry. J tube with feeds held, drain with purulent output    Lab Results:  Recent Labs    02/02/24 0011 02/02/24 0515 02/02/24 0627  WBC 32.1*  --  33.0*  HGB 6.8* 11.9* 6.6*  HCT 21.6* 35.0* 21.1*  PLT 721*  --  703*   BMET Recent Labs    02/02/24 0011 02/02/24 0515 02/02/24 0627  NA 137 137 139  K 4.2 4.1 3.9  CL 103  --  105  CO2 24  --  26  GLUCOSE 253*  --  116*  BUN 28*  --  33*  CREATININE 1.17  --  1.11  CALCIUM  8.1*  --  8.5*   PT/INR Recent Labs    02/02/24 0011  LABPROT 15.7*  INR 1.2   CMP     Component Value Date/Time   NA 139 02/02/2024 0627   K 3.9 02/02/2024 0627   CL 105 02/02/2024 0627   CO2 26 02/02/2024 0627   GLUCOSE 116 (H) 02/02/2024 0627   BUN 33 (H) 02/02/2024 0627   CREATININE 1.11 02/02/2024 0627   CREATININE 0.85 10/10/2023 0824   CALCIUM  8.5 (L) 02/02/2024 0627   PROT 5.6 (L) 02/02/2024 0011   ALBUMIN  <1.5 (L) 02/02/2024 0011   AST 38  02/02/2024 0011   AST 15 10/10/2023 0824   ALT 62 (H) 02/02/2024 0011   ALT 11 10/10/2023 0824   ALKPHOS 66 02/02/2024 0011   BILITOT 0.5 02/02/2024 0011   BILITOT 0.5 10/10/2023 0824   GFRNONAA >60 02/02/2024 0627   GFRNONAA >60 10/10/2023 0824   GFRAA  10/16/2007 1246    >60        The eGFR has been calculated using the MDRD equation. This calculation has not been validated in all clinical   Lipase  No results found for: LIPASE   Assessment/Plan  [name] is a 68 yo male with gastric cardia adenocarcinoma, POD18 s/p total gastrectomy, with distal esophagectomy and roux-en-Y esophagojejunostomy on 9/3. POD17 s/p takeback by thoracic surgery for RATS repair of anastomotic leak on 9/4. POD10 s/p EGD with placement of esophageal stent on 9/11. - Patient unfortunately suffered cardiac arrest last night and is currently on 3 pressors and intubated. Appreciate CCM assistance with vent management and supportive care - Family transitioned him to DNR but would want him to receive complete medical therapy up until an arrest. Will attempt to further evaluate the patient's GoC with family later today pending his clinical course - He is febrile with concerns for  infection of the collection in his chest. Will discuss with CT surg if any interventions may be warranted/feasible - Remainder of care per CCM   LOS: 18 days    Jon DELENA Idler, MD [facility] Surgery General, Hepatobiliary and Pancreatic Surgery 02/02/24 9:12 AM

## 2024-02-02 NOTE — Progress Notes (Signed)
 eLink Physician-Brief Progress Note Patient Name: Serafin Decatur DOB: [birth year] MRN: [id number]   Date of Service  02/02/2024  HPI/Events of Note  RR in 40s and very uncomfortable on vent. CCM note pending. Not synchronous on vent.  eICU Interventions  Add Prn fentanyl  Will need infusion if this does not help     Intervention Category Major Interventions: Respiratory failure - evaluation and management  Floyd Wade G Brentton Wardlow 02/02/2024, 2:54 AM

## 2024-02-02 NOTE — Significant Event (Addendum)
 Rapid Response Event Note   Reason for Call :  SpO2 mid 80s on RA, pt diaphoretic. RN placed pt on NRB mask and called RRT.   Initial Focused Assessment:  Pt lying in bed with eyes closed. His breathing is labored. He is intermittently obstructing his airway-obstructing stops with jaw thrust/chin lift.  Lungs with rhonchi t/o. He will open eyes to voice but will not follow commands. Pupils 5, equal, reactive. Skin cool/diaphoretic.  HR-86, BP-113/73,  RR-30, SpO2-100% on NRB  RT called to room to assist. Pt mental status and respiratory status continued to decline. At 2323, pt began breathing agonally-RT began to bag pt. 2324-pt PEA arrested. Code Blue initiated. ROSC achieved after 2 minutes and 1 epi. Pt continued to breath agonally so bagging continued. Pt BP initially high after ROSC obtained but declined quickly requiring levo gtt to be started and IO to be placed. Pt intubated by CRNA. BP continued to decline despite levo gtt and pt lost pulse again. ROSC again obtained after CPR and 1 EPI. Pt txed to 4N18 with code blue team.   Interventions:  CBG-173 Code Blue-see code sheet for details Tx to ICU Plan of Care:  Pt txed to 4N18.    Event Summary:   MD Notified: Dr. Ann notified by [facility] staff and was present during code blue. PCCM consulted and met pt on arrival to 4N18. Call [phone] Arrival Time:2259 End Time:0015  Tish Graeme Piety, RN

## 2024-02-02 NOTE — Plan of Care (Signed)
 Able to decrease pressor requirements throughout the day, still not able to receive anything through J-tube, multiple family members have visited and received updates on patient. Still receiving sedation and pain medications to keep patient comfortable.
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
## 2024-02-02 NOTE — Progress Notes (Signed)
 02/02/2024 Seen and examined. Seems to have settled out, was reaching for tube. Vent mechanics are not bad. ARDS pattern on CXR. Echo looks fine Sed[place], lung protective ventilation Broad spectrum abx, getting 1 unit blood TF by J tube  Rolan Sharps MD PCCM

## 2024-02-02 NOTE — Anesthesia Procedure Notes (Addendum)
 Procedure Name: Intubation Date/Time: 02/01/2024 11:33 PM  Performed by: Celia Alan HERO, CRNAPre-anesthesia Checklist: Patient identified, Emergency Drugs available, Suction available and Patient being monitored Patient Re-evaluated:Patient Re-evaluated prior to induction Oxygen Delivery Method: Ambu bag Preoxygenation: Pre-oxygenation with 100% oxygen Ventilation: Mask ventilation without difficulty Laryngoscope Size: Glidescope and 3 Grade View: Grade I Tube type: Oral Tube size: 7.5 mm Number of attempts: 1 Airway Equipment and Method: Video-laryngoscopy Placement Confirmation: ETT inserted through vocal cords under direct vision, positive ETCO2, breath sounds checked- equal and bilateral and CO2 detector Secured at: 26 cm Tube secured with: Tape Dental Injury: Teeth and Oropharynx as per pre-operative assessment

## 2024-02-02 NOTE — Progress Notes (Signed)
 Nutrition Follow-up  DOCUMENTATION CODES:   Severe malnutrition in context of chronic illness  INTERVENTION:   -Pending GOC -If tube feeds to be resumed: -Vital 1.5 @ 60 ml/hr via J-tube (1440 ml per day) -60 ml ProSource TF20 BID -FWF per surgery -Provides: 2320 kcal, 137 grams protein, and 1100 ml free water    NUTRITION DIAGNOSIS:   Severe Malnutrition related to cancer and cancer related treatments as evidenced by severe muscle depletion, moderate fat depletion, energy intake < or equal to 75% for > or equal to 1 month, percent weight loss (has lost 28 lbs, 15% in 6 months).  Ongoing.  GOAL:   Patient will meet greater than or equal to 90% of their needs  Not meeting currently.  MONITOR:   Diet advancement, TF tolerance, Labs, I & O's, Skin  REASON FOR ASSESSMENT:   Consult Assessment of nutrition requirement/status, Enteral/tube feeding initiation and management  ASSESSMENT:   [name] is a 68 yo male who was diagnosed with uT2N0 adenocarcinoma of the gastric cardia earlier this year. EUS showed evidence of invasion into the GE junction. The patient completed 3 cycles neoadjuvant FLOT with some complications, and was not able to tolerate further chemotherapy. [facility]ed to [facility] for surgery now post op total gastrectomy with distal esophagectomy, roux-en-Y esophagojejunostomy, J-tube. PMH of T2DM, GERD, HTN, a-fib, hernia repair.  9/3 - total gastrectomy with distal esophagectomy, roux-en-Y esophagojejunostomy, J-tube, chest tube, NGT placed to lIS  9/4 - s/p Thoracoscopy repair of esophageal leak using Myriad Matrix  9/6 - transferred to ICU for hypoxia and increased o2 requirements 9/7 - Trickle tube feeds started, 20 ml/hr 9/8 - Tube feeds advanced to 30 ml/hr, transferred to progressive  9/9 - Tube feeds advanced to 40 ml/hr increase by 10 ml every 6 hours until goal of 60 ml/hr 9/10 - TF held 9/11 - Back to OR for leak, esophageal stent placed,  intubated 9/12 - Extubated  9/15 -started on CLD 9/18 -NPO again  RD received consult for tube feeding initiation early this AM but now pt expected to have further GOC meeting with family today. TF on hold, orders to not use G-J tube at this time. Will leave tube feeding recommendations above for when/if pt resumes tube feeds via J-tube. Pt intubated today following code blue, cardiac arrest.  Patient is currently intubated on ventilator support MV: 15.3 L/min Temp (24hrs), Avg:99.2 F (37.3 C), Min:96.2 F (35.7 C), Max:101.6 F (38.7 C)  Admission weight: 178 lbs Current weight: 166 lbs  Medications: Imodium , Precedex , Epinephrine , Fentanyl , Lactated ringers , Levophed , Vasopressin , Versed   Labs reviewed: CBGs: 103 Low iron (<10)   Diet Order:   Diet Order             Diet NPO time specified Except for: Ice Chips  Diet effective now                   EDUCATION NEEDS:   Education needs have been addressed  Skin:  Skin Assessment: Skin Integrity Issues: Skin Integrity Issues:: Incisions, Other (Comment) Incisions: 9/3 abdomen, 9/4 rt chest Other: right back wound, left buttocks wound  Last BM:  9/21 -type 6  Height:   Ht Readings from Last 1 Encounters:  01/16/24 5' 11 (1.803 m)    Weight:   Wt Readings from Last 1 Encounters:  02/01/24 75.3 kg    Ideal Body Weight:  78.2 kg  BMI:  Body mass index is 23.15 kg/m.  Estimated Nutritional Needs:   Kcal:  2300-2500 kcal  Protein:  120-140 gm  Fluid:  >/=2L/day   Morna Lee, MS, RD, LDN Inpatient Clinical Dietitian Contact via Secure chat

## 2024-02-02 NOTE — Progress Notes (Signed)
 eLink Physician-Brief Progress Note Patient Name: [name] DOB: 01/17/1956 MRN: [id number]   Date of Service  02/02/2024  HPI/Events of Note  Received request for renewal of restraints Patient seen intubated and a risk for self harm by pulling lines and tubes  eICU Interventions  Bilateral soft wrist restraints renewed Bedside team to assess in am if restraints to be continued        [name] 02/02/2024, 11:18 PM

## 2024-02-02 NOTE — Progress Notes (Signed)
 Family notified, [name] Eagles [phone] and voice message left  notifying family that pt has been transferred to ICU .

## 2024-02-02 NOTE — Progress Notes (Signed)
 eLink Physician-Brief Progress Note Patient Name: [name] DOB: July 27, 1955 MRN: [id number]   Date of Service  02/02/2024  HPI/Events of Note  56 M afib, DM, gastric adeno CA s/p total gastrectomy with distal esophagectomy and Roux-en-Y esophagojejunostomy 9/3 s/p take back for RATS repair of anastomotic leak 9/4 s/p EGD with placement of esophageal stent 9/11. He was apparently well but had episode of desaturation, then bradycardic and then PEA arrest.  eICU Interventions  Bedside CCM team currently inserting central line. Patient likely had an aspiration event. Discussed with RN Family has been updated and are on their way     Intervention Category Evaluation Type: New Patient Evaluation  [name] 02/02/2024, 12:37 AM

## 2024-02-02 NOTE — Procedures (Signed)
 Arterial Line Insertion Start/End9/21/2025 8:50 AM, 02/02/2024 9:08 AM  Patient location: ICU. Preanesthetic checklist: patient identified, IV checked, site marked, risks and benefits discussed, monitors and equipment checked, pre-op evaluation and timeout performed Left, radial was placed Catheter size: 20 G Hand hygiene performed , maximum sterile barriers used  and Seldinger technique used Allen's test indicative of satisfactory collateral circulation Attempts: 1 Procedure performed without using ultrasound guided technique. Patient tolerated the procedure well with no immediate complications.

## 2024-02-02 NOTE — Code Documentation (Cosign Needed Addendum)
  Patient Name: [name]   MRN: [id number]   Date of Birth/ Sex: 1955/10/25 , male      Admission Date: 01/15/2024  Attending Provider: Dasie Leonor CROME, MD  Primary Diagnosis: Adenocarcinoma of gastric cardia [facility])   Indication: Pt was in his usual state of health until this PM, when he was noted to be in PEA. Code blue was subsequently called. At the time of arrival on scene, ACLS protocol was underway.   Technical Description:  - CPR performance duration:  10 minutes  - Was defibrillation or cardioversion used? No   - Was external pacer placed? No  - Was patient intubated pre/post CPR? No   Medications Administered: Y = Yes; Blank = No Amiodarone   N  Atropine   N  Calcium   N  Epinephrine   Y  Lidocaine   N  Magnesium   N  Norepinephrine   N  Phenylephrine   N  Sodium bicarbonate   N  Vasopressin   N   Post CPR evaluation:  - Final Status - Was patient successfully resuscitated ? Yes - What is current rhythm? Atrial fibrillation - What is current hemodynamic status? Unstable  Miscellaneous Information:  - Labs sent, including: CBC, Phos, Mg, CMP, Procal, troponin, lactic acid  - Primary team notified?  Yes  - Family Notified? Yes  - Additional notes/ transfer status: Transferred from 4NP10 to 4N18     Charmayne Holmes, DO  02/02/2024, 12:02 AM

## 2024-02-02 NOTE — Progress Notes (Signed)
   02/02/24 0120  Provider Notification  Provider Name/Title Fonda Sharps NP  Date Provider Notified 02/02/24  Time Provider Notified 0120  Method of Notification Face-to-face  Notification Reason Critical Result  Test performed and critical result Hgb (6.8)  Date Critical Result Received 02/02/24  Time Critical Result Received 0120  Provider response In department  Date of Provider Response 02/02/24  Time of Provider Response 0120

## 2024-02-02 NOTE — Progress Notes (Signed)
 Central Venous Catheter Insertion Procedure Note  Toussaint Golson  [id number]  May 02, 1956  Date:02/02/24  Time:1:55 AM   Provider Performing:Broxton Broady JAYSON Claudene   Procedure: Insertion of Non-tunneled Central Venous [phone]) with US  guidance (23062)   Indication(s) Medication administration and Difficult access  Consent Unable to obtain consent due to emergent nature of procedure.  Anesthesia Topical only with 1% lidocaine    Timeout Verified patient identification, verified procedure, site/side was marked, verified correct patient position, special equipment/implants available, medications/allergies/relevant history reviewed, required imaging and test results available.  Sterile Technique Maximal sterile technique including full sterile barrier drape, hand hygiene, sterile gown, sterile gloves, mask, hair covering, sterile ultrasound probe cover (if used).  Procedure Description Area of catheter insertion was cleaned with chlorhexidine  and draped in sterile fashion.  With real-time ultrasound guidance a central venous catheter was placed into the left femoral vein. Nonpulsatile blood flow and easy flushing noted in all ports.  The catheter was sutured in place and sterile dressing applied.  Complications/Tolerance None; patient tolerated the procedure well. Chest X-ray is ordered to verify placement for internal jugular or subclavian cannulation.   Chest x-ray is not ordered for femoral cannulation.  EBL Minimal  Specimen(s) None   Sherlean Claudene AGACNP-BC   [facility] 02/02/2024, 1:55 AM  Please see [email or web address] for pager details.  From 7A-7P if no response, please call [phone]. After hours, please call ELink [phone].

## 2024-02-02 NOTE — Progress Notes (Signed)
 CCM NP at bedside. Hgb of 6.8 acknowledged.
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
## 2024-02-03 DIAGNOSIS — D649 Anemia, unspecified: Secondary | ICD-10-CM | POA: Diagnosis not present

## 2024-02-03 DIAGNOSIS — I469 Cardiac arrest, cause unspecified: Secondary | ICD-10-CM | POA: Diagnosis not present

## 2024-02-03 DIAGNOSIS — C160 Malignant neoplasm of cardia: Secondary | ICD-10-CM | POA: Diagnosis not present

## 2024-02-03 DIAGNOSIS — J9601 Acute respiratory failure with hypoxia: Secondary | ICD-10-CM | POA: Diagnosis not present

## 2024-02-03 LAB — CBC
HCT: 21.9 % — ABNORMAL LOW (ref 39.0–52.0)
Hemoglobin: 7 g/dL — ABNORMAL LOW (ref 13.0–17.0)
MCH: 31 pg (ref 26.0–34.0)
MCHC: 32 g/dL (ref 30.0–36.0)
MCV: 96.9 fL (ref 80.0–100.0)
Platelets: 520 K/uL — ABNORMAL HIGH (ref 150–400)
RBC: 2.26 MIL/uL — ABNORMAL LOW (ref 4.22–5.81)
RDW: 18.2 % — ABNORMAL HIGH (ref 11.5–15.5)
WBC: 27.3 K/uL — ABNORMAL HIGH (ref 4.0–10.5)
nRBC: 0 % (ref 0.0–0.2)

## 2024-02-03 LAB — GLUCOSE, CAPILLARY
Glucose-Capillary: 141 mg/dL — ABNORMAL HIGH (ref 70–99)
Glucose-Capillary: 152 mg/dL — ABNORMAL HIGH (ref 70–99)
Glucose-Capillary: 121 mg/dL — ABNORMAL HIGH (ref 70–99)
Glucose-Capillary: 99 mg/dL (ref 70–99)
Glucose-Capillary: 131 mg/dL — ABNORMAL HIGH (ref 70–99)

## 2024-02-03 LAB — BASIC METABOLIC PANEL WITH GFR
Anion gap: 8 (ref 5–15)
BUN: 31 mg/dL — ABNORMAL HIGH (ref 8–23)
CO2: 25 mmol/L (ref 22–32)
Calcium: 8.5 mg/dL — ABNORMAL LOW (ref 8.9–10.3)
Chloride: 105 mmol/L (ref 98–111)
Creatinine, Ser: 0.93 mg/dL (ref 0.61–1.24)
GFR, Estimated: >60 mL/min (ref >60)
Glucose, Bld: 152 mg/dL — ABNORMAL HIGH (ref 70–99)
Potassium: 4.3 mmol/L (ref 3.5–5.1)
Sodium: 138 mmol/L (ref 135–145)

## 2024-02-03 LAB — HEMOGLOBIN AND HEMATOCRIT, BLOOD
HCT: 24.7 % — ABNORMAL LOW (ref 39.0–52.0)
Hemoglobin: 7.9 g/dL — ABNORMAL LOW (ref 13.0–17.0)

## 2024-02-03 LAB — PREPARE RBC (CROSSMATCH)

## 2024-02-03 MED ORDER — HYDROMORPHONE HCL 1 MG/ML IJ SOLN
1.0000 mg | Freq: Once | INTRAMUSCULAR | Status: AC
  Administered 2024-02-03: 1 mg via INTRAVENOUS
  Filled 2024-02-03: qty 1

## 2024-02-03 MED ORDER — ARFORMOTEROL TARTRATE 15 MCG/2ML IN NEBU
15.0000 ug | INHALATION_SOLUTION | Freq: Two times a day (BID) | RESPIRATORY_TRACT | Status: DC
Start: 2024-02-03 — End: 2024-03-07
  Administered 2024-02-03 – 2024-03-07 (×65): 15 ug via RESPIRATORY_TRACT
  Filled 2024-02-03 (×67): qty 2

## 2024-02-03 MED ORDER — VITAL 1.5 CAL PO LIQD
1000.0000 mL | ORAL | Status: DC
  Administered 2024-02-03: 1000 mL

## 2024-02-03 MED ORDER — ORAL CARE MOUTH RINSE
15.0000 mL | OROMUCOSAL | Status: DC
  Administered 2024-02-03 – 2024-02-06 (×14): 15 mL via OROMUCOSAL

## 2024-02-03 MED ORDER — OXYCODONE HCL 5 MG/5ML PO SOLN: 5.0000 mg | Freq: Four times a day (QID) | ORAL | Status: DC | PRN

## 2024-02-03 MED ORDER — SODIUM CHLORIDE 0.9% IV SOLUTION: Freq: Once | INTRAVENOUS | Status: AC

## 2024-02-03 MED ORDER — REVEFENACIN 175 MCG/3ML IN SOLN
175.0000 ug | Freq: Every day | RESPIRATORY_TRACT | Status: DC
  Administered 2024-02-03 – 2024-03-07 (×32): 175 ug via RESPIRATORY_TRACT
  Filled 2024-02-03 (×34): qty 3

## 2024-02-03 MED ORDER — FENTANYL CITRATE PF 50 MCG/ML IJ SOSY
12.5000 ug | PREFILLED_SYRINGE | INTRAMUSCULAR | Status: DC | PRN
  Administered 2024-02-03 (×2): 50 ug via INTRAVENOUS
  Administered 2024-02-03: 25 ug via INTRAVENOUS
  Administered 2024-02-04 (×2): 50 ug via INTRAVENOUS
  Administered 2024-02-04: 12.5 ug via INTRAVENOUS
  Administered 2024-02-05 – 2024-02-06 (×2): 50 ug via INTRAVENOUS
  Filled 2024-02-03 (×8): qty 1

## 2024-02-03 MED ORDER — VITAL 1.5 CAL PO LIQD
1000.0000 mL | ORAL | Status: DC
Start: 2024-02-03 — End: 2024-02-05
  Administered 2024-02-03 – 2024-02-05 (×3): 1000 mL via JEJUNOSTOMY

## 2024-02-03 MED ORDER — OXYCODONE HCL 5 MG/5ML PO SOLN
5.0000 mg | Freq: Four times a day (QID) | ORAL | Status: DC | PRN
  Administered 2024-02-03 – 2024-02-04 (×3): 5 mg
  Filled 2024-02-03 (×3): qty 5

## 2024-02-03 MED ORDER — ACETAMINOPHEN 160 MG/5ML PO SOLN
650.0000 mg | Freq: Three times a day (TID) | ORAL | Status: DC
  Administered 2024-02-03 – 2024-02-07 (×15): 650 mg via JEJUNOSTOMY
  Filled 2024-02-03 (×15): qty 20.3

## 2024-02-03 MED ORDER — METHOCARBAMOL 1000 MG/10ML IJ SOLN: 500.0000 mg | Freq: Three times a day (TID) | INTRAMUSCULAR | Status: DC | PRN

## 2024-02-03 MED ORDER — ORAL CARE MOUTH RINSE: 15.0000 mL | OROMUCOSAL | Status: DC | PRN
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
## 2024-02-03 NOTE — Progress Notes (Signed)
 eLink Physician-Brief Progress Note Patient Name: [name] DOB: Feb 20, 1956 MRN: [id number]   Date of Service  02/03/2024  HPI/Events of Note  Notified that aline is not working and asking if ok to remove Currently only on vasopressin  and able to obtain non-invasive BP  eICU Interventions  OK to remove malfunctioning aline Bedside rounding team to reassess if aline re-insertion would be necessary     Intervention Category Intermediate Interventions: Other:  [name] 02/03/2024, 3:50 AM

## 2024-02-03 NOTE — Plan of Care (Signed)
  Problem: Nutritional: Goal: Maintenance of adequate nutrition will improve Outcome: Progressing   Problem: Skin Integrity: Goal: Risk for impaired skin integrity will decrease Outcome: Progressing   Problem: Tissue Perfusion: Goal: Adequacy of tissue perfusion will improve Outcome: Progressing   Problem: Clinical Measurements: Goal: Respiratory complications will improve Outcome: Progressing   Problem: Activity: Goal: Risk for activity intolerance will decrease Outcome: Progressing   Problem: Pain Managment: Goal: General experience of comfort will improve and/or be controlled Outcome: Progressing   Problem: Safety: Goal: Ability to remain free from injury will improve Outcome: Progressing

## 2024-02-03 NOTE — Procedures (Signed)
 Extubation Procedure Note  Patient Details:   Name: [name] DOB: 11/04/1955 MRN: [id number]   Airway Documentation:    Vent end date: 02/03/24 Vent end time: 1245   Evaluation  O2 sats: stable throughout Complications: No apparent complications Patient did tolerate procedure well. Bilateral Breath Sounds: Rhonchi, Diminished   Yes Pt extubated to 2L [place] per MD. Positive cuff leak noted.  Leontine Murtis GAILS 02/03/2024, 12:46 PM

## 2024-02-03 NOTE — Progress Notes (Signed)
 I called patient's sister [name] Eagles and updated her via phone on patient's current status.

## 2024-02-03 NOTE — Progress Notes (Signed)
 SLP Cancellation Note  Patient Details Name: [name] MRN: [id number] DOB: 09/16/1955   Cancelled treatment:       Reason Eval/Treat Not Completed: Patient not medically ready. Pt now intubated. Will f/u when ready   Lehi Phifer, Consuelo Fitch 02/03/2024, 8:41 AM

## 2024-02-03 NOTE — Progress Notes (Signed)
 Physical Therapy Treatment Patient Details Name: [name] MRN: [id number] DOB: 09-18-1955 Today's Date: 02/03/2024   History of Present Illness 68 y.o. male presents to [facility] hospital on 01/15/2024 with gastric CA. Pt underwent total gastrectomy with omentectomy and D2 lymphadenectomy, esophagojejunostomy with Jtube placement, splenic biopsy on 9/3. Pt developed bilious output into chest tube on 9/4, returned to OR for EGD and thoracoscopy with application of wound matrix to EJ anastomosis. ETT 9/11-9/12 for desats and unresponsiveness. S/p EGD and esophageal stent placement 9/11. Cardiac arrest 9/19. Re-ett 9/19-9/22. PMH includes asthma, OA, COPD, GERD, DMII, MDD, emphysema.    PT Comments  Pt irritable upon arrival to room given severe chest wall pain, suspect due to CPR, and states he has not had adequate pain medication. Pt agreeable to PT session focused on functional mobility. Pt moved to/from EOB with mod truncal and LE assist, once EOB pt complaining of severe chest wall pain with SPO2 desat to 81% on3LO2, tachypnea, and UE tremors which pt endorses is due to pain. Pt placed on 4LO2, cued in breathing technique, and returned to supine with recovery to 92%. PT anticipates better mobility with continued pain control. PT to continue to follow.      If plan is discharge home, recommend the following: A lot of help with walking and/or transfers;A lot of help with bathing/dressing/bathroom;Direct supervision/assist for medications management;Assistance with feeding;Help with stairs or ramp for entrance;Supervision due to cognitive status   Can travel by private vehicle        Equipment Recommendations  Other (comment) (tbd)    Recommendations for Other Services       Precautions / Restrictions Precautions Precautions: Fall Precaution/Restrictions Comments: R JP drain x 1, J tube, fecal management system Restrictions Weight Bearing Restrictions Per Provider Order: No     Mobility  Bed  Mobility Overal bed mobility: Needs Assistance Bed Mobility: Rolling, Sidelying to Sit, Sit to Sidelying Rolling: Mod assist Sidelying to sit: Mod assist     Sit to sidelying: Mod assist General bed mobility comments: assist for trunk and LE management, boost up in bed +2    Transfers                   General transfer comment: nt given severe pain    Ambulation/Gait                   Stairs             Wheelchair Mobility     Tilt Bed    Modified Rankin (Stroke Patients Only)       Balance Overall balance assessment: Needs assistance Sitting-balance support: Feet supported, Bilateral upper extremity supported Sitting balance-Leahy Scale: Fair Sitting balance - Comments: can sit EOB unsupported, but benefits from truncal steadying Postural control: Posterior lean     Standing balance comment: nt today                            Communication Communication Communication: Impaired Factors Affecting Communication: Hearing impaired  Cognition Arousal: Alert Behavior During Therapy: Restless   PT - Cognitive impairments: Problem solving, Safety/Judgement                       PT - Cognition Comments: pt irritable, complaining of severe chest wall pain and not receiving enough pain medications Following commands: Impaired Following commands impaired: Follows one step commands with increased time  Cueing Cueing Techniques: Verbal cues, Tactile cues  Exercises      General Comments General comments (skin integrity, edema, etc.): SPO2 drop to 81% on 3LO2, bumped up to 4LO2 for recovery to 92%. Tachypnea with severe pain + tremors      Pertinent Vitals/Pain Pain Assessment Pain Assessment: Faces Faces Pain Scale: Hurts whole lot Pain Location: chest wall Pain Descriptors / Indicators: Sore, Discomfort, Grimacing, Guarding, Moaning Pain Intervention(s): Limited activity within patient's tolerance, Monitored during  session, Repositioned    Home Living                          Prior Function            PT Goals (current goals can now be found in the care plan section) Acute Rehab PT Goals Patient Stated Goal: to return to independence PT Goal Formulation: With patient Time For Goal Achievement: 02/14/24 Potential to Achieve Goals: Fair Progress towards PT goals: Progressing toward goals    Frequency    Min 2X/week      PT Plan      Co-evaluation              AM-PAC PT 6 Clicks Mobility   Outcome Measure  Help needed turning from your back to your side while in a flat bed without using bedrails?: A Lot Help needed moving from lying on your back to sitting on the side of a flat bed without using bedrails?: A Lot Help needed moving to and from a bed to a chair (including a wheelchair)?: Total Help needed standing up from a chair using your arms (e.g., wheelchair or bedside chair)?: Total Help needed to walk in hospital room?: Total Help needed climbing 3-5 steps with a railing? : Total 6 Click Score: 8    End of Session Equipment Utilized During Treatment: Oxygen Activity Tolerance: Patient limited by pain Patient left: with call bell/phone within reach;in bed;with bed alarm set;with nursing/sitter in room Nurse Communication: Mobility status PT Visit Diagnosis: Other abnormalities of gait and mobility (R26.89);Muscle weakness (generalized) (M62.81);Pain     Time: 1510-1533 PT Time Calculation (min) (ACUTE ONLY): 23 min  Charges:    $Therapeutic Activity: 8-22 mins PT General Charges $$ ACUTE PT VISIT: 1 Visit                     [name], PT DPT Acute Rehabilitation Services Secure Chat Preferred  Office [phone]    [name] 02/03/2024, 4:28 PM

## 2024-02-03 NOTE — Progress Notes (Signed)
 11 Days Post-Op  Subjective: Remains on vaso intermittently, all other pressors weaned off. Wakes up and is interactive. Afebrile. WBC 27 from 35. Good UOP.  Objective: Vital signs in last 24 hours: Temp:  [97.4 F (36.3 C)-101.6 F (38.7 C)] 98 F (36.7 C) (09/22 0400) Pulse Rate:  [60-110] 61 (09/22 0630) Resp:  [16-28] 25 (09/22 0630) BP: (91-148)/(62-87) 121/73 (09/22 0630) SpO2:  [91 %-100 %] 97 % (09/22 0630) Arterial Line BP: (80-139)/(47-71) 80/58 (09/22 0400) FiO2 (%):  [40 %-80 %] 40 % (09/22 0337) Last BM Date : 02/02/24  Intake/Output from previous day: 09/21 0701 - 09/22 0700 In: 3931.4 [I.V.:3116.4; Blood:315; IV Piggyback:500] Out: 1200 [Urine:980; Drains:110; Stool:110] Intake/Output this shift: Total I/O In: 1344.8 [I.V.:1144.8; IV Piggyback:200] Out: 590 [Urine:550; Drains:30; Stool:10]  PE: General: intubated, NAD Neuro: wakes up, purposeful, follows commands. GCS 11T. Resp: mechanical ventilation, equal chest rise. R pleural JP with purulent fluid. CV: RRR Abdomen: soft and nondistended. Upper midline incision clean and dry. Prior drain site in RUQ is closed. J tube capped.    Lab Results:  Recent Labs    02/02/24 1156 02/03/24 0359  WBC 34.9* 27.3*  HGB 7.5* 7.0*  HCT 23.7* 21.9*  PLT 608* 520*   BMET Recent Labs    02/02/24 0627 02/03/24 0359  NA 139 138  K 3.9 4.3  CL 105 105  CO2 26 25  GLUCOSE 116* 152*  BUN 33* 31*  CREATININE 1.11 0.93  CALCIUM  8.5* 8.5*   PT/INR Recent Labs    02/02/24 0011  LABPROT 15.7*  INR 1.2   CMP     Component Value Date/Time   NA 138 02/03/2024 0359   K 4.3 02/03/2024 0359   CL 105 02/03/2024 0359   CO2 25 02/03/2024 0359   GLUCOSE 152 (H) 02/03/2024 0359   BUN 31 (H) 02/03/2024 0359   CREATININE 0.93 02/03/2024 0359   CREATININE 0.85 10/10/2023 0824   CALCIUM  8.5 (L) 02/03/2024 0359   PROT 5.6 (L) 02/02/2024 0011   ALBUMIN  <1.5 (L) 02/02/2024 0011   AST 38 02/02/2024 0011    AST 15 10/10/2023 0824   ALT 62 (H) 02/02/2024 0011   ALT 11 10/10/2023 0824   ALKPHOS 66 02/02/2024 0011   BILITOT 0.5 02/02/2024 0011   BILITOT 0.5 10/10/2023 0824   GFRNONAA >60 02/03/2024 0359   GFRNONAA >60 10/10/2023 0824   GFRAA  10/16/2007 1246    >60        The eGFR has been calculated using the MDRD equation. This calculation has not been validated in all clinical   Lipase  No results found for: LIPASE   Assessment/Plan  [name] is a 68 yo male with gastric cardia adenocarcinoma, POD19 s/p total gastrectomy, with distal esophagectomy and roux-en-Y esophagojejunostomy on 9/3. POD18 s/p takeback by thoracic surgery for RATS repair of anastomotic leak on 9/4. POD11 s/p EGD with placement of esophageal stent on 9/11. - S/p cardiac arrest night of 9/20, suspected aspiration event. Has been improving over last 24 hours, almost off pressors, wakes up. - Abdomen is soft and patient is having bowel function. Resume J tube feeds today and advance to goal. Ok to use J tube for liquid medications only, no crushed meds. - Wean vasopressin  as tolerated - Vent management and sedation per CCM, appreciate assistance - ID: On broad-spectrum abx and antifungal coverage for treatment of aspiration pneumonia and pleural fluid collection. Blood cultures negative to date. Fevers improved today. Surgical drain  is well-positioned within pleural collection, keep to gravity drainage per thoracic. Will need 6 weeks of Gram-negative and antifungal coverage per thoracic surgery. - A-fib: previous metoprolol  and dilt on hold in setting of shock. Will also continue holding anticoagulation for now given anemia. - Code status: DNR, continue full scope of medical care - Dispo: ICU    LOS: 19 days    Jon LITTIE Dawn, MD [facility] Surgery General, Hepatobiliary and Pancreatic Surgery 02/03/24 6:53 AM

## 2024-02-03 NOTE — TOC Progression Note (Signed)
 Transition of Care [facility]) - Progression Note    Patient Details  Name: [name] MRN: [id number] Date of Birth: 22-Apr-1956  [facility]) CM/SW Contact  Inocente GORMAN Kindle, LCSW Phone Number: 02/03/2024, 8:40 AM  Clinical Narrative:    Patient transferred back to ICU on 9/20 and is currently intubated. CSW continuing to follow.    Expected Discharge Plan: Skilled Nursing Facility Barriers to Discharge: Continued Medical Work up, English as a second language teacher, SNF Pending bed offer               Expected Discharge Plan and Services     Post Acute Care Choice: Skilled Nursing Facility Living arrangements for the past 2 months: Single Family Home                                       Social Drivers of Health (SDOH) Interventions SDOH Screenings   Food Insecurity: No Food Insecurity (01/28/2024)  Housing: Low Risk  (01/28/2024)  Transportation Needs: No Transportation Needs (01/28/2024)  Utilities: Not At Risk (01/28/2024)  Depression (PHQ2-9): Low Risk  (11/25/2023)  Financial Resource Strain: Low Risk  (08/20/2023)  Social Connections: Unknown (01/28/2024)  Stress: No Stress Concern Present (08/20/2023)  Tobacco Use: High Risk (01/28/2024)    Readmission Risk Interventions    11/05/2023   12:23 PM  Readmission Risk Prevention Plan  Transportation Screening Complete  PCP or Specialist Appt within 3-5 Days Complete  HRI or Home Care Consult Complete  Social Work Consult for Recovery Care Planning/Counseling Complete  Palliative Care Screening Complete  Medication Review Oceanographer) Complete

## 2024-02-04 ENCOUNTER — Inpatient Hospital Stay (HOSPITAL_COMMUNITY)

## 2024-02-04 DIAGNOSIS — C160 Malignant neoplasm of cardia: Secondary | ICD-10-CM | POA: Diagnosis not present

## 2024-02-04 DIAGNOSIS — D649 Anemia, unspecified: Secondary | ICD-10-CM | POA: Diagnosis not present

## 2024-02-04 DIAGNOSIS — I469 Cardiac arrest, cause unspecified: Secondary | ICD-10-CM | POA: Diagnosis not present

## 2024-02-04 DIAGNOSIS — J9601 Acute respiratory failure with hypoxia: Secondary | ICD-10-CM | POA: Diagnosis not present

## 2024-02-04 LAB — TYPE AND SCREEN
ABO/RH(D): A POS
Antibody Screen: NEGATIVE
Unit division: 0
Unit division: 0

## 2024-02-04 LAB — CBC
HCT: 27.8 % — ABNORMAL LOW (ref 39.0–52.0)
Hemoglobin: 9 g/dL — ABNORMAL LOW (ref 13.0–17.0)
MCH: 30.8 pg (ref 26.0–34.0)
MCHC: 32.4 g/dL (ref 30.0–36.0)
MCV: 95.2 fL (ref 80.0–100.0)
Platelets: 618 K/uL — ABNORMAL HIGH (ref 150–400)
RBC: 2.92 MIL/uL — ABNORMAL LOW (ref 4.22–5.81)
RDW: 17.4 % — ABNORMAL HIGH (ref 11.5–15.5)
WBC: 23.1 K/uL — ABNORMAL HIGH (ref 4.0–10.5)
nRBC: 0 % (ref 0.0–0.2)

## 2024-02-04 LAB — BASIC METABOLIC PANEL WITH GFR
Anion gap: 9 (ref 5–15)
BUN: 24 mg/dL — ABNORMAL HIGH (ref 8–23)
CO2: 24 mmol/L (ref 22–32)
Calcium: 8.6 mg/dL — ABNORMAL LOW (ref 8.9–10.3)
Chloride: 107 mmol/L (ref 98–111)
Creatinine, Ser: 0.76 mg/dL (ref 0.61–1.24)
GFR, Estimated: >60 mL/min (ref >60)
Glucose, Bld: 155 mg/dL — ABNORMAL HIGH (ref 70–99)
Potassium: 3.7 mmol/L (ref 3.5–5.1)
Sodium: 140 mmol/L (ref 135–145)

## 2024-02-04 LAB — BPAM RBC
Blood Product Expiration Date: 202510132359
Blood Product Expiration Date: 202510162359
ISSUE DATE / TIME: 202509210733
ISSUE DATE / TIME: 202509220811
Unit Type and Rh: 6200
Unit Type and Rh: 6200

## 2024-02-04 LAB — CULTURE, RESPIRATORY W GRAM STAIN

## 2024-02-04 LAB — MAGNESIUM: Magnesium: 2 mg/dL (ref 1.7–2.4)

## 2024-02-04 LAB — HEPATIC FUNCTION PANEL
ALT: 40 U/L (ref 0–44)
AST: 27 U/L (ref 15–41)
Albumin: 1.6 g/dL — ABNORMAL LOW (ref 3.5–5.0)
Alkaline Phosphatase: 59 U/L (ref 38–126)
Bilirubin, Direct: 0.1 mg/dL (ref 0.0–0.2)
Indirect Bilirubin: 0.5 mg/dL (ref 0.3–0.9)
Total Bilirubin: 0.6 mg/dL (ref 0.0–1.2)
Total Protein: 5.7 g/dL — ABNORMAL LOW (ref 6.5–8.1)

## 2024-02-04 LAB — PHOSPHORUS: Phosphorus: 3 mg/dL (ref 2.5–4.6)

## 2024-02-04 LAB — GLUCOSE, CAPILLARY
Glucose-Capillary: 129 mg/dL — ABNORMAL HIGH (ref 70–99)
Glucose-Capillary: 142 mg/dL — ABNORMAL HIGH (ref 70–99)
Glucose-Capillary: 143 mg/dL — ABNORMAL HIGH (ref 70–99)
Glucose-Capillary: 145 mg/dL — ABNORMAL HIGH (ref 70–99)
Glucose-Capillary: 157 mg/dL — ABNORMAL HIGH (ref 70–99)
Glucose-Capillary: 167 mg/dL — ABNORMAL HIGH (ref 70–99)

## 2024-02-04 MED ORDER — METHOCARBAMOL 1000 MG/10ML IJ SOLN
1000.0000 mg | Freq: Three times a day (TID) | INTRAMUSCULAR | Status: DC
  Administered 2024-02-04 – 2024-02-07 (×9): 1000 mg via INTRAVENOUS
  Filled 2024-02-04 (×10): qty 10

## 2024-02-04 MED ORDER — POTASSIUM CHLORIDE 20 MEQ PO PACK
40.0000 meq | PACK | Freq: Once | ORAL | Status: AC
  Administered 2024-02-04: 40 meq
  Filled 2024-02-04: qty 2

## 2024-02-04 MED ORDER — LIDOCAINE 5 % EX PTCH
2.0000 | MEDICATED_PATCH | CUTANEOUS | Status: DC
  Administered 2024-02-04 – 2024-02-08 (×5): 2 via TRANSDERMAL
  Filled 2024-02-04 (×5): qty 2

## 2024-02-04 MED ORDER — OXYCODONE HCL 5 MG/5ML PO SOLN: 5.0000 mg | ORAL | Status: DC | PRN

## 2024-02-04 MED ORDER — RISPERIDONE 1 MG/ML PO SOLN
3.0000 mg | Freq: Every day | ORAL | Status: DC
  Administered 2024-02-04 – 2024-02-06 (×3): 3 mg
  Filled 2024-02-04 (×3): qty 3

## 2024-02-04 MED ORDER — MELATONIN 1 MG/4ML PO LIQD: 2.0000 mg | Freq: Every day | ORAL | Status: DC

## 2024-02-04 MED ORDER — OXYCODONE HCL 20 MG/ML PO CONC
5.0000 mg | ORAL | Status: DC | PRN
  Administered 2024-02-06: 10 mg via JEJUNOSTOMY
  Filled 2024-02-04: qty 0.5

## 2024-02-04 MED ORDER — OXYCODONE HCL 5 MG/5ML PO SOLN
10.0000 mg | ORAL | Status: DC
  Administered 2024-02-04 – 2024-02-06 (×13): 10 mg via JEJUNOSTOMY
  Filled 2024-02-04 (×13): qty 10

## 2024-02-04 MED ORDER — METHOCARBAMOL 1000 MG/10ML IJ SOLN: 500.0000 mg | Freq: Three times a day (TID) | INTRAMUSCULAR | Status: DC

## 2024-02-04 MED ORDER — CLONIDINE HCL 0.1 MG/24HR TD PTWK
0.1000 mg | MEDICATED_PATCH | TRANSDERMAL | Status: DC
  Administered 2024-02-04 – 2024-02-06 (×2): 0.1 mg via TRANSDERMAL
  Filled 2024-02-04 (×2): qty 1

## 2024-02-04 MED ORDER — METOPROLOL TARTRATE 25 MG/10 ML ORAL SUSPENSION
50.0000 mg | Freq: Two times a day (BID) | ORAL | Status: DC
  Administered 2024-02-04 – 2024-02-06 (×5): 50 mg via JEJUNOSTOMY
  Filled 2024-02-04 (×5): qty 20

## 2024-02-04 NOTE — Progress Notes (Signed)
 12 Days Post-Op  Subjective: Extubated yesterday afternoon. Off all pressors, hypertensive. On precedex , slightly confused but oriented to place. Complains of severe pain in his chest. WBC down to 23.  Objective: Vital signs in last 24 hours: Temp:  [97.2 F (36.2 C)-100.1 F (37.8 C)] 98.3 F (36.8 C) (09/23 0400) Pulse Rate:  [66-110] 90 (09/23 0637) Resp:  [11-30] 19 (09/23 0637) BP: (99-166)/(60-89) 131/70 (09/23 0600) SpO2:  [93 %-100 %] 97 % (09/23 0637) FiO2 (%):  [36 %-40 %] 36 % (09/23 0002) Weight:  [74.5 kg] 74.5 kg (09/23 0500) Last BM Date : 02/04/24  Intake/Output from previous day: 09/22 0701 - 09/23 0700 In: 2339.3 [I.V.:613.7; Blood:355; NG/GT:865.5; IV Piggyback:505.1] Out: 2255 [Urine:2125; Drains:130] Intake/Output this shift: No intake/output data recorded.  PE: General: alert, mildly confused Neuro: alert, answers questions Resp: nonlabored respirations on nasal cannula. Pleural JP with bilious fluid. CV: RRR Abdomen: soft and nondistended. Upper midline incision clean and dry. Prior drain site in RUQ is closed. J tube with feeds running at 60. GU: foley draining clear yellow urine    Lab Results:  Recent Labs    02/03/24 0359 02/03/24 1245 02/04/24 0518  WBC 27.3*  --  23.1*  HGB 7.0* 7.9* 9.0*  HCT 21.9* 24.7* 27.8*  PLT 520*  --  618*   BMET Recent Labs    02/03/24 0359 02/04/24 0518  NA 138 140  K 4.3 3.7  CL 105 107  CO2 25 24  GLUCOSE 152* 155*  BUN 31* 24*  CREATININE 0.93 0.76  CALCIUM  8.5* 8.6*   PT/INR Recent Labs    02/02/24 0011  LABPROT 15.7*  INR 1.2   CMP     Component Value Date/Time   NA 140 02/04/2024 0518   K 3.7 02/04/2024 0518   CL 107 02/04/2024 0518   CO2 24 02/04/2024 0518   GLUCOSE 155 (H) 02/04/2024 0518   BUN 24 (H) 02/04/2024 0518   CREATININE 0.76 02/04/2024 0518   CREATININE 0.85 10/10/2023 0824   CALCIUM  8.6 (L) 02/04/2024 0518   PROT 5.7 (L) 02/04/2024 0518   ALBUMIN  1.6 (L)  02/04/2024 0518   AST 27 02/04/2024 0518   AST 15 10/10/2023 0824   ALT 40 02/04/2024 0518   ALT 11 10/10/2023 0824   ALKPHOS 59 02/04/2024 0518   BILITOT 0.6 02/04/2024 0518   BILITOT 0.5 10/10/2023 0824   GFRNONAA >60 02/04/2024 0518   GFRNONAA >60 10/10/2023 0824   GFRAA  10/16/2007 1246    >60        The eGFR has been calculated using the MDRD equation. This calculation has not been validated in all clinical   Lipase  No results found for: LIPASE   Assessment/Plan  [name] is a 68 yo male with gastric cardia adenocarcinoma, POD20 s/p total gastrectomy, with distal esophagectomy and roux-en-Y esophagojejunostomy on 9/3. POD19 s/p takeback by thoracic surgery for RATS repair of anastomotic leak on 9/4. POD12 s/p EGD with placement of esophageal stent on 9/11. - S/p cardiac arrest night of 9/20, suspected aspiration event. - Pleural JP is bilious, which is new since yesterday. Remain strict NPO, will discuss with thoracic. On broad-spectrum abx. Cancel SLP eval as patient needs to remain NPO. - Continue J tube feeds at goal. Having bowel movements. - Pain control: on scheduled tylenol , schedule IV robaxin , increase oxycodone  via J tube - Wean precedex  as tolerated - Remove foley - ID: On broad-spectrum abx and antifungal coverage for treatment  of aspiration pneumonia and EJ leak. Drain is again bilious, however WBC is downtrending and patient is afebrile.  - A-fib: in sinus rhythm. Resume metoprolol  via J tube. - Code status: DNR, continue full scope of medical care - Dispo: ICU    LOS: 20 days    Jon LITTIE Dawn, MD [facility] Surgery General, Hepatobiliary and Pancreatic Surgery 02/04/24 7:03 AM

## 2024-02-04 NOTE — Progress Notes (Signed)
 Pharmacy Electrolyte Replacement  Recent Labs:  Recent Labs    02/04/24 0518  K 3.7  MG 2.0  PHOS 3.0  CREATININE 0.76    Low Critical Values (K </= 2.5, Phos </= 1, Mg </= 1) Present: None  Plan: Give KCL 40 meq packet for 1 dose.   Jerusalen Mateja, PharmD

## 2024-02-04 NOTE — Plan of Care (Signed)
  Problem: Skin Integrity: Goal: Risk for impaired skin integrity will decrease Outcome: Progressing   Problem: Tissue Perfusion: Goal: Adequacy of tissue perfusion will improve Outcome: Progressing   Problem: Pain Managment: Goal: General experience of comfort will improve and/or be controlled Outcome: Progressing
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
## 2024-02-04 NOTE — Plan of Care (Signed)
   Problem: Coping: Goal: Ability to adjust to condition or change in health will improve Outcome: Progressing   Problem: Fluid Volume: Goal: Ability to maintain a balanced intake and output will improve Outcome: Progressing   Problem: Nutritional: Goal: Maintenance of adequate nutrition will improve Outcome: Progressing   Problem: Skin Integrity: Goal: Risk for impaired skin integrity will decrease Outcome: Progressing

## 2024-02-04 NOTE — Progress Notes (Signed)
 Occupational Therapy Treatment Patient Details Name: [name] MRN: [id number] DOB: [birth year] Today's Date: 02/04/2024   History of present illness 68 y.o. male presents to [facility] hospital on 01/15/2024 with gastric CA. Pt underwent total gastrectomy with omentectomy and D2 lymphadenectomy, esophagojejunostomy with Jtube placement, splenic biopsy on 9/3. Pt developed bilious output into chest tube on 9/4, returned to OR for EGD and thoracoscopy with application of wound matrix to EJ anastomosis. ETT 9/11-9/12 for desats and unresponsiveness. S/p EGD and esophageal stent placement 9/11. Cardiac arrest 9/19. Re-ett 9/19-9/22. PMH includes asthma, OA, COPD, GERD, DMII, MDD, emphysema.   OT comments  Patient with less expressed discomfort to chest this session.  Remains a little confused, but is able to participate with rehab for OOB and transfer to the recliner.  Mod A for bed mobility, Max A for lower body dress from a sit to stand level and Min A to stand and take pivotal steps to the recliner.  OT will continue efforts in the acute setting to address deficits and Patient will benefit from continued inpatient follow up therapy, <3 hours/day to maximize his functional status prior to returning home.        If plan is discharge home, recommend the following:  A lot of help with walking and/or transfers;Two people to help with walking and/or transfers;A lot of help with bathing/dressing/bathroom;Two people to help with bathing/dressing/bathroom;Assistance with cooking/housework;Assist for transportation;Help with stairs or ramp for entrance   Equipment Recommendations       Recommendations for Other Services      Precautions / Restrictions Precautions Precautions: Fall Precaution/Restrictions Comments: R JP drain x 1, J tube, fecal management system Restrictions Weight Bearing Restrictions Per Provider Order: No       Mobility Bed Mobility   Bed Mobility: Supine to Sit     Supine to sit:  Mod assist       Patient Response: Cooperative  Transfers Overall transfer level: Needs assistance Equipment used: None Transfers: Sit to/from Stand, Bed to chair/wheelchair/BSC Sit to Stand: Min assist     Step pivot transfers: Min assist           Balance Overall balance assessment: Needs assistance Sitting-balance support: Feet supported, Bilateral upper extremity supported Sitting balance-Leahy Scale: Fair     Standing balance support: Single extremity supported Standing balance-Leahy Scale: Fair                             ADL either performed or assessed with clinical judgement   ADL   Eating/Feeding: NPO   Grooming: Wash/dry hands;Wash/dry face;Set up;Sitting   Upper Body Bathing: Sitting;Minimal assistance   Lower Body Bathing: Maximal assistance;Sit to/from stand   Upper Body Dressing : Sitting;Minimal assistance   Lower Body Dressing: Maximal assistance;Sit to/from stand   Toilet Transfer: Minimal assistance;Stand-pivot;BSC/3in1                  Extremity/Trunk Assessment Upper Extremity Assessment Upper Extremity Assessment: Generalized weakness   Lower Extremity Assessment Lower Extremity Assessment: Defer to PT evaluation   Cervical / Trunk Assessment Cervical / Trunk Assessment: Other exceptions Cervical / Trunk Exceptions: abdominal incision, Jtube    Vision Patient Visual Report: No change from baseline     Perception Perception Perception: Not tested   Praxis Praxis Praxis: Not tested   Communication Communication Communication: Impaired Factors Affecting Communication: Reduced clarity of speech   Cognition Arousal: Alert Behavior During Therapy: Anxious Cognition: Cognition impaired  Orientation impairments: Situation   Memory impairment (select all impairments): Short-term memory, Working memory Attention impairment (select first level of impairment): Sustained attention Executive functioning  impairment (select all impairments): Reasoning, Problem solving                   Following commands: Impaired Following commands impaired: Follows one step commands with increased time      Cueing   Cueing Techniques: Verbal cues, Tactile cues  Exercises      Shoulder Instructions       General Comments  VSS on O2    Pertinent Vitals/ Pain       Pain Assessment Pain Assessment: Faces Faces Pain Scale: Hurts little more Pain Location: chest wall Pain Descriptors / Indicators: Aching, Sore, Tender Pain Intervention(s): Premedicated before session                                                          Frequency  Min 2X/week        Progress Toward Goals  OT Goals(current goals can now be found in the care plan section)  Progress towards OT goals: Progressing toward goals  Acute Rehab OT Goals Patient Stated Goal: Return home OT Goal Formulation: With patient Time For Goal Achievement: 02/08/24 Potential to Achieve Goals: Fair  Plan      Co-evaluation                 AM-PAC OT 6 Clicks Daily Activity     Outcome Measure   Help from another person eating meals?: A Little Help from another person taking care of personal grooming?: A Little Help from another person toileting, which includes using toliet, bedpan, or urinal?: A Lot Help from another person bathing (including washing, rinsing, drying)?: A Lot Help from another person to put on and taking off regular upper body clothing?: A Lot Help from another person to put on and taking off regular lower body clothing?: A Lot 6 Click Score: 14    End of Session Equipment Utilized During Treatment: Gait belt  OT Visit Diagnosis: Unsteadiness on feet (R26.81);Muscle weakness (generalized) (M62.81);Other symptoms and signs involving cognitive function;Pain   Activity Tolerance Patient tolerated treatment well   Patient Left in chair;with call bell/phone within  reach;with chair alarm set   Nurse Communication Mobility status        Time: 8643-8581 OT Time Calculation (min): 22 min  Charges: OT General Charges $OT Visit: 1 Visit OT Treatments $Self Care/Home Management : 8-22 mins  02/04/2024  RP, OTR/L  Acute Rehabilitation Services  Office:  [phone]   [name] 02/04/2024, 2:28 PM

## 2024-02-05 LAB — GLUCOSE, CAPILLARY
Glucose-Capillary: 108 mg/dL — ABNORMAL HIGH (ref 70–99)
Glucose-Capillary: 124 mg/dL — ABNORMAL HIGH (ref 70–99)
Glucose-Capillary: 132 mg/dL — ABNORMAL HIGH (ref 70–99)
Glucose-Capillary: 139 mg/dL — ABNORMAL HIGH (ref 70–99)
Glucose-Capillary: 142 mg/dL — ABNORMAL HIGH (ref 70–99)
Glucose-Capillary: 176 mg/dL — ABNORMAL HIGH (ref 70–99)
Glucose-Capillary: 188 mg/dL — ABNORMAL HIGH (ref 70–99)

## 2024-02-05 LAB — CBC
HCT: 28.8 % — ABNORMAL LOW (ref 39.0–52.0)
Hemoglobin: 9.2 g/dL — ABNORMAL LOW (ref 13.0–17.0)
MCH: 30.8 pg (ref 26.0–34.0)
MCHC: 31.9 g/dL (ref 30.0–36.0)
MCV: 96.3 fL (ref 80.0–100.0)
Platelets: 616 K/uL — ABNORMAL HIGH (ref 150–400)
RBC: 2.99 MIL/uL — ABNORMAL LOW (ref 4.22–5.81)
RDW: 16.6 % — ABNORMAL HIGH (ref 11.5–15.5)
WBC: 16 K/uL — ABNORMAL HIGH (ref 4.0–10.5)
nRBC: 0 % (ref 0.0–0.2)

## 2024-02-05 LAB — BODY FLUID CULTURE W GRAM STAIN

## 2024-02-05 LAB — BASIC METABOLIC PANEL WITH GFR
Anion gap: 7 (ref 5–15)
BUN: 21 mg/dL (ref 8–23)
CO2: 29 mmol/L (ref 22–32)
Calcium: 8.7 mg/dL — ABNORMAL LOW (ref 8.9–10.3)
Chloride: 105 mmol/L (ref 98–111)
Creatinine, Ser: 0.72 mg/dL (ref 0.61–1.24)
GFR, Estimated: >60 mL/min (ref >60)
Glucose, Bld: 172 mg/dL — ABNORMAL HIGH (ref 70–99)
Potassium: 3.9 mmol/L (ref 3.5–5.1)
Sodium: 141 mmol/L (ref 135–145)

## 2024-02-05 LAB — MAGNESIUM: Magnesium: 1.9 mg/dL (ref 1.7–2.4)

## 2024-02-05 LAB — PHOSPHORUS: Phosphorus: 3.1 mg/dL (ref 2.5–4.6)

## 2024-02-05 MED ORDER — LABETALOL HCL 5 MG/ML IV SOLN: 10.0000 mg | INTRAVENOUS | Status: DC | PRN

## 2024-02-05 MED ORDER — OSMOLITE 1.5 CAL PO LIQD
1000.0000 mL | ORAL | Status: DC
  Administered 2024-02-05 – 2024-02-06 (×2): 1000 mL

## 2024-02-05 MED ORDER — ENOXAPARIN SODIUM 80 MG/0.8ML IJ SOSY
1.0000 mg/kg | PREFILLED_SYRINGE | Freq: Two times a day (BID) | INTRAMUSCULAR | Status: DC
  Administered 2024-02-05 – 2024-02-13 (×18): 75 mg via SUBCUTANEOUS
  Filled 2024-02-05 (×19): qty 0.75
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
## 2024-02-05 NOTE — Progress Notes (Signed)
 Nutrition Follow-up  DOCUMENTATION CODES:   Severe malnutrition in context of chronic illness  INTERVENTION:   Continue tube feeding via J-tube: D/C Vital 1.5  Change to Osmolite 1.5 at 60 ml/h (1440 ml per day) Prosource TF20 60 ml BID  Provides 2320 kcal, 130 gm protein, 1094 ml free water  daily  150 ml free water  every 2 hours Total free water : 2894 ml   NUTRITION DIAGNOSIS:   Severe Malnutrition related to cancer and cancer related treatments as evidenced by severe muscle depletion, moderate fat depletion, energy intake < or equal to 75% for > or equal to 1 month, percent weight loss (has lost 28 lbs, 15% in 6 months). Ongoing; being addressed with TF  GOAL:   Patient will meet greater than or equal to 90% of their needs Met with TF at goal   MONITOR:   Diet advancement, TF tolerance, Labs, I & O's, Skin  REASON FOR ASSESSMENT:   Consult Assessment of nutrition requirement/status, Enteral/tube feeding initiation and management  ASSESSMENT:   [name] is a 68 yo male who was diagnosed with uT2N0 adenocarcinoma of the gastric cardia earlier this year. EUS showed evidence of invasion into the GE junction. The patient completed 3 cycles neoadjuvant FLOT with some complications, and was not able to tolerate further chemotherapy. Presented to [facility] for surgery now post op total gastrectomy with distal esophagectomy, roux-en-Y esophagojejunostomy, J-tube. PMH of T2DM, GERD, HTN, a-fib, hernia repair.  Pt discussed during ICU rounds and with RN and MD.  Pt tolerating J-tube feedings at goal. Pt confused and hallucinating during visit. RN aware.  Pt with EJ leak and pleural JP drain.  Per therapy will need SNF at discharge.   9/3 - total gastrectomy with distal esophagectomy, roux-en-Y esophagojejunostomy, J-tube, chest tube, NGT placed to lIS  9/4 - s/p Thoracoscopy repair of esophageal leak using Myriad Matrix  9/6 - transferred to ICU for hypoxia and increased o2  requirements 9/7 - Trickle tube feeds started, 20 ml/hr 9/8 - Tube feeds advanced to 30 ml/hr, transferred to progressive  9/9 - Tube feeds advanced to 40 ml/hr increase by 10 ml every 6 hours until goal of 60 ml/hr 9/10 - TF held 9/11 - Back to OR for leak, esophageal stent placed, intubated 9/12 - Extubated  9/20 - cardiac arrest with suspected aspiration event   Medications reviewed and include: SSI every 4 hours Precedex  Vital 1.5 @ 60 ProSource TF20 BID  Labs reviewed:  CBG's: 132-188  Chest JP: 75 ml 14 F J-tube  Current weight: 74.5 kg Admission weight: 80.7 kg    Diet Order:   Diet Order             Diet NPO time specified Except for: Ice Chips  Diet effective now                   EDUCATION NEEDS:   Education needs have been addressed  Skin:  Skin Assessment: Skin Integrity Issues: Skin Integrity Issues:: Incisions, Other (Comment) Incisions: 9/3 abdomen, 9/4 rt chest Other: right back wound, left buttocks wound  Last BM:  9/23  Height:   Ht Readings from Last 1 Encounters:  01/16/24 5' 11 (1.803 m)    Weight:   Wt Readings from Last 1 Encounters:  02/04/24 74.5 kg    Ideal Body Weight:  78.2 kg  BMI:  Body mass index is 22.91 kg/m.  Estimated Nutritional Needs:   Kcal:  2300-2500 kcal  Protein:  120-140 gm  Fluid:  >/=2L/day  Jon SQUIBB., RD, LDN, CNSC See AMiON for contact information
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
## 2024-02-05 NOTE — Progress Notes (Addendum)
 13 Days Post-Op  Subjective: Remains delirious. Did not sleep overnight. WBC down to 16, afebrile. Pain seems better controlled today. Precedex  is almost off.  Objective: Vital signs in last 24 hours: Temp:  [97.5 F (36.4 C)-98.4 F (36.9 C)] 98.1 F (36.7 C) (09/24 0400) Pulse Rate:  [80-108] 108 (09/24 0600) Resp:  [13-26] 18 (09/24 0600) BP: (106-173)/(74-129) 147/129 (09/24 0600) SpO2:  [88 %-98 %] 96 % (09/24 0600) FiO2 (%):  [32 %] 32 % (09/23 2300) Last BM Date : 02/04/24  Intake/Output from previous day: 09/23 0701 - 09/24 0700 In: 2665.4 [I.V.:315.8; NG/GT:1352.8; IV Piggyback:996.8] Out: 2840 [Urine:2725; Drains:75; Stool:40] Intake/Output this shift: No intake/output data recorded.  PE: General: alert, disoriented Resp: nonlabored respirations on nasal cannula. Pleural JP with turbid bilious fluid. CV: mild tachycardia low 100s, regular Abdomen: soft and nondistended. Upper midline incision clean and dry. Prior drain site in RUQ is closed. J tube with feeds running at 60.    Lab Results:  Recent Labs    02/04/24 0518 02/05/24 0500  WBC 23.1* 16.0*  HGB 9.0* 9.2*  HCT 27.8* 28.8*  PLT 618* 616*   BMET Recent Labs    02/04/24 0518 02/05/24 0500  NA 140 141  K 3.7 3.9  CL 107 105  CO2 24 29  GLUCOSE 155* 172*  BUN 24* 21  CREATININE 0.76 0.72  CALCIUM  8.6* 8.7*   PT/INR No results for input(s): LABPROT, INR in the last 72 hours.  CMP     Component Value Date/Time   NA 141 02/05/2024 0500   K 3.9 02/05/2024 0500   CL 105 02/05/2024 0500   CO2 29 02/05/2024 0500   GLUCOSE 172 (H) 02/05/2024 0500   BUN 21 02/05/2024 0500   CREATININE 0.72 02/05/2024 0500   CREATININE 0.85 10/10/2023 0824   CALCIUM  8.7 (L) 02/05/2024 0500   PROT 5.7 (L) 02/04/2024 0518   ALBUMIN  1.6 (L) 02/04/2024 0518   AST 27 02/04/2024 0518   AST 15 10/10/2023 0824   ALT 40 02/04/2024 0518   ALT 11 10/10/2023 0824   ALKPHOS 59 02/04/2024 0518   BILITOT  0.6 02/04/2024 0518   BILITOT 0.5 10/10/2023 0824   GFRNONAA >60 02/05/2024 0500   GFRNONAA >60 10/10/2023 0824   GFRAA  10/16/2007 1246    >60        The eGFR has been calculated using the MDRD equation. This calculation has not been validated in all clinical   Lipase  No results found for: LIPASE   Assessment/Plan  Mr. Natarajan is a 68 yo male with gastric cardia adenocarcinoma, POD21 s/p total gastrectomy, with distal esophagectomy and roux-en-Y esophagojejunostomy on 9/3. POD20 s/p takeback by thoracic surgery for RATS repair of anastomotic leak on 9/4. POD13 s/p EGD with placement of esophageal stent on 9/11. - S/p cardiac arrest night of 9/20, suspected aspiration event. - EJ leak: Pleural JP remains bilious today, discussed with Dr. Shyrl yesterday. Output 75mL in last 24 hours. Continue antibiotics and antifungal coverage. Strict NPO. - Continue J tube feeds at goal. Having bowel movements. - Pain control: scheduled tylenol  and robaxin , prn oxycodone  - Delirium: Clonidine  patch and Risperdal  at bedtime started yesterday. Weaning precedex . Needs better sleep hygiene.  - ID: On broad-spectrum abx and antifungal coverage for treatment of aspiration pneumonia and EJ leak. WBC continues to downtrend, afebrile. - A-fib: in sinus rhythm. Metoprolol  via J tube. Hgb stable, resume therapeutic anticoagulation today (lovenox ). [employer], PT/OT following, recommending SNF at  discharge. - Code status: DNR, continue full scope of medical care - Dispo: ICU    LOS: 21 days    [name], MD [facility] Surgery General, Hepatobiliary and Pancreatic Surgery 02/05/24 7:23 AM

## 2024-02-05 NOTE — TOC Progression Note (Signed)
 Transition of Care [facility]) - Progression Note    Patient Details  Name: Zerek Litsey MRN: [id number] Date of Birth: 06-20-1955  Transition of Care [facility] CM/SW Contact  Inocente GORMAN Kindle, LCSW Phone Number: 02/05/2024, 10:19 AM  Clinical Narrative:    CSW continuing to follow for medical progression.    Expected Discharge Plan: Skilled Nursing Facility Barriers to Discharge: Continued Medical Work up, English as a second language teacher, SNF Pending bed offer               Expected Discharge Plan and Services     Post Acute Care Choice: Skilled Nursing Facility Living arrangements for the past 2 months: Single Family Home                                       Social Drivers of Health (SDOH) Interventions SDOH Screenings   Food Insecurity: No Food Insecurity (01/28/2024)  Housing: Low Risk  (01/28/2024)  Transportation Needs: No Transportation Needs (01/28/2024)  Utilities: Not At Risk (01/28/2024)  Depression (PHQ2-9): Low Risk  (11/25/2023)  Financial Resource Strain: Low Risk  (08/20/2023)  Social Connections: Unknown (01/28/2024)  Stress: No Stress Concern Present (08/20/2023)  Tobacco Use: High Risk (01/28/2024)    Readmission Risk Interventions    11/05/2023   12:23 PM  Readmission Risk Prevention Plan  Transportation Screening Complete  PCP or Specialist Appt within 3-5 Days Complete  HRI or Home Care Consult Complete  Social Work Consult for Recovery Care Planning/Counseling Complete  Palliative Care Screening Complete  Medication Review Oceanographer) Complete

## 2024-02-05 NOTE — Progress Notes (Signed)
 Physical Therapy Treatment Patient Details Name: [name] MRN: [id number] DOB: 05/21/1955 Today's Date: 02/05/2024   History of Present Illness 68 y.o. male presents to [facility] hospital on 01/15/2024 with gastric CA. Pt underwent total gastrectomy with omentectomy and D2 lymphadenectomy, esophagojejunostomy with Jtube placement, splenic biopsy on 9/3. Pt developed bilious output into chest tube on 9/4, returned to OR for EGD and thoracoscopy with application of wound matrix to EJ anastomosis. ETT 9/11-9/12 for desats and unresponsiveness. S/p EGD and esophageal stent placement 9/11. Cardiac arrest 9/19. Re-ett 9/19-9/22. PMH includes asthma, OA, COPD, GERD, DMII, MDD, emphysema.    PT Comments  Pt is limited by reports of pain at ribs throughout session. Pt stands three times with PT assistance but is unable to progress to ambulation, requesting to quickly return to sitting once upright. PT defers transfer to recliner as the pt remains confused and was received lying diagonally across the bed with both legs handing over the beds edge. Patient will benefit from continued inpatient follow up therapy, <3 hours/day. If activity tolerance improves the pt may become appropriate for a higher intensity post-acute rehab.   If plan is discharge home, recommend the following:     Can travel by private vehicle        Equipment Recommendations       Recommendations for Other Services       Precautions / Restrictions Precautions Precautions: Fall Recall of Precautions/Restrictions: Impaired Precaution/Restrictions Comments: R JP drain x 1, J tube, fecal management system Restrictions Weight Bearing Restrictions Per Provider Order: No     Mobility  Bed Mobility Overal bed mobility: Needs Assistance Bed Mobility: Supine to Sit, Sit to Supine     Supine to sit: Mod assist, HOB elevated Sit to supine: Mod assist, HOB elevated        Transfers Overall transfer level: Needs assistance Equipment  used: 1 person hand held assist Transfers: Sit to/from Stand Sit to Stand: Mod assist           General transfer comment: pt stands 3 times during session for very brief periods due to rib/chest pain. PT providing assistance anteriorly to patient. Unable to tolerate ambulation, instead performed mini stand pivot transfers to scoot laterally toward head of bed    Ambulation/Gait                   Stairs             Wheelchair Mobility     Tilt Bed    Modified Rankin (Stroke Patients Only)       Balance Overall balance assessment: Needs assistance Sitting-balance support: Single extremity supported, Feet supported Sitting balance-Leahy Scale: Poor     Standing balance support: Bilateral upper extremity supported, Reliant on assistive device for balance Standing balance-Leahy Scale: Poor                              Communication Communication Communication: Impaired Factors Affecting Communication: Reduced clarity of speech  Cognition Arousal: Alert Behavior During Therapy: WFL for tasks assessed/performed   PT - Cognitive impairments: Orientation, Awareness, Memory, Problem solving, Safety/Judgement, Attention                         Following commands: Impaired Following commands impaired: Follows one step commands inconsistently, Follows one step commands with increased time, Follows multi-step commands inconsistently    Cueing Cueing Techniques: Verbal cues, Visual cues,  Tactile cues  Exercises      General Comments General comments (skin integrity, edema, etc.): VSS, pt on 3L [place]. SpO2 in low 90s      Pertinent Vitals/Pain Pain Assessment Pain Assessment: Faces Faces Pain Scale: Hurts whole lot Pain Location: chest wall Pain Descriptors / Indicators: Grimacing Pain Intervention(s): Monitored during session    Home Living                          Prior Function            PT Goals (current goals  can now be found in the care plan section) Acute Rehab PT Goals Patient Stated Goal: to return to independence Progress towards PT goals: Progressing toward goals    Frequency           PT Plan      Co-evaluation              AM-PAC PT 6 Clicks Mobility   Outcome Measure  Help needed turning from your back to your side while in a flat bed without using bedrails?: A Lot Help needed moving from lying on your back to sitting on the side of a flat bed without using bedrails?: A Lot Help needed moving to and from a bed to a chair (including a wheelchair)?: A Lot Help needed standing up from a chair using your arms (e.g., wheelchair or bedside chair)?: A Lot Help needed to walk in hospital room?: Total Help needed climbing 3-5 steps with a railing? : Total 6 Click Score: 10    End of Session Equipment Utilized During Treatment: Oxygen Activity Tolerance: Patient limited by pain Patient left: in bed;with call bell/phone within reach;with bed alarm set;with family/visitor present;with nursing/sitter in room Nurse Communication: Mobility status PT Visit Diagnosis: Other abnormalities of gait and mobility (R26.89);Muscle weakness (generalized) (M62.81);Pain Pain - part of body:  (ribs)     Time: 8868-8851 PT Time Calculation (min) (ACUTE ONLY): 17 min  Charges:    $Therapeutic Activity: 8-22 mins PT General Charges $$ ACUTE PT VISIT: 1 Visit                     Bernardino JINNY Ruth, PT, DPT Acute Rehabilitation Office [phone]    Bernardino JINNY Ruth 02/05/2024, 1:55 PM

## 2024-02-06 ENCOUNTER — Encounter (HOSPITAL_COMMUNITY)
Admission: RE | Disposition: A | Discharge status: Skilled Nursing Facility | Payer: Self-pay | Source: Home / Self Care | Attending: Surgery

## 2024-02-06 ENCOUNTER — Inpatient Hospital Stay (HOSPITAL_COMMUNITY): Admitting: Certified Registered Nurse Anesthetist

## 2024-02-06 ENCOUNTER — Inpatient Hospital Stay (HOSPITAL_COMMUNITY)

## 2024-02-06 DIAGNOSIS — I2510 Atherosclerotic heart disease of native coronary artery without angina pectoris: Secondary | ICD-10-CM

## 2024-02-06 DIAGNOSIS — K9189 Other postprocedural complications and disorders of digestive system: Secondary | ICD-10-CM

## 2024-02-06 DIAGNOSIS — I10 Essential (primary) hypertension: Secondary | ICD-10-CM

## 2024-02-06 DIAGNOSIS — F17210 Nicotine dependence, cigarettes, uncomplicated: Secondary | ICD-10-CM

## 2024-02-06 HISTORY — PX: ESOPHAGOGASTRODUODENOSCOPY: SHX5428

## 2024-02-06 LAB — CBC
HCT: 31.5 % — ABNORMAL LOW (ref 39.0–52.0)
Hemoglobin: 9.9 g/dL — ABNORMAL LOW (ref 13.0–17.0)
MCH: 29.9 pg (ref 26.0–34.0)
MCHC: 31.4 g/dL (ref 30.0–36.0)
MCV: 95.2 fL (ref 80.0–100.0)
Platelets: 599 K/uL — ABNORMAL HIGH (ref 150–400)
RBC: 3.31 MIL/uL — ABNORMAL LOW (ref 4.22–5.81)
RDW: 15.9 % — ABNORMAL HIGH (ref 11.5–15.5)
WBC: 20.4 K/uL — ABNORMAL HIGH (ref 4.0–10.5)
nRBC: 0 % (ref 0.0–0.2)

## 2024-02-06 LAB — GLUCOSE, CAPILLARY
Glucose-Capillary: 141 mg/dL — ABNORMAL HIGH (ref 70–99)
Glucose-Capillary: 200 mg/dL — ABNORMAL HIGH (ref 70–99)
Glucose-Capillary: 103 mg/dL — ABNORMAL HIGH (ref 70–99)
Glucose-Capillary: 127 mg/dL — ABNORMAL HIGH (ref 70–99)
Glucose-Capillary: 118 mg/dL — ABNORMAL HIGH (ref 70–99)
Glucose-Capillary: 151 mg/dL — ABNORMAL HIGH (ref 70–99)

## 2024-02-06 LAB — BASIC METABOLIC PANEL WITH GFR
Anion gap: 10 (ref 5–15)
BUN: 22 mg/dL (ref 8–23)
CO2: 26 mmol/L (ref 22–32)
Calcium: 8.8 mg/dL — ABNORMAL LOW (ref 8.9–10.3)
Chloride: 103 mmol/L (ref 98–111)
Creatinine, Ser: 0.7 mg/dL (ref 0.61–1.24)
GFR, Estimated: >60 mL/min (ref >60)
Glucose, Bld: 185 mg/dL — ABNORMAL HIGH (ref 70–99)
Potassium: 4.3 mmol/L (ref 3.5–5.1)
Sodium: 139 mmol/L (ref 135–145)

## 2024-02-06 LAB — PHOSPHORUS: Phosphorus: 3.1 mg/dL (ref 2.5–4.6)

## 2024-02-06 LAB — MAGNESIUM: Magnesium: 1.9 mg/dL (ref 1.7–2.4)

## 2024-02-06 SURGERY — EGD (ESOPHAGOGASTRODUODENOSCOPY)
Anesthesia: General | Site: Esophagus

## 2024-02-06 MED ORDER — PROPOFOL 10 MG/ML IV BOLUS
INTRAVENOUS | Status: DC | PRN
  Administered 2024-02-06: 100 mg via INTRAVENOUS

## 2024-02-06 MED ORDER — OXYCODONE HCL 5 MG/5ML PO SOLN: 5.0000 mg | Freq: Once | ORAL | Status: DC | PRN

## 2024-02-06 MED ORDER — MEPERIDINE HCL 25 MG/ML IJ SOLN: 6.2500 mg | INTRAMUSCULAR | Status: DC | PRN

## 2024-02-06 MED ORDER — PHENYLEPHRINE 80 MCG/ML (10ML) SYRINGE FOR IV PUSH (FOR BLOOD PRESSURE SUPPORT)
PREFILLED_SYRINGE | INTRAVENOUS | Status: DC | PRN
  Administered 2024-02-06 (×2): 160 ug via INTRAVENOUS

## 2024-02-06 MED ORDER — PROPOFOL 10 MG/ML IV BOLUS
INTRAVENOUS | Status: AC
  Filled 2024-02-06: qty 20

## 2024-02-06 MED ORDER — OXYCODONE HCL 5 MG/5ML PO SOLN
5.0000 mg | ORAL | Status: DC
  Administered 2024-02-07: 5 mg via JEJUNOSTOMY
  Filled 2024-02-06: qty 5

## 2024-02-06 MED ORDER — ONDANSETRON HCL 4 MG/2ML IJ SOLN
INTRAMUSCULAR | Status: DC | PRN
  Administered 2024-02-06: 4 mg via INTRAVENOUS

## 2024-02-06 MED ORDER — LACTATED RINGERS IV SOLN: INTRAVENOUS | Status: DC | PRN

## 2024-02-06 MED ORDER — FENTANYL CITRATE (PF) 100 MCG/2ML IJ SOLN: 25.0000 ug | INTRAMUSCULAR | Status: DC | PRN

## 2024-02-06 MED ORDER — SUGAMMADEX SODIUM 200 MG/2ML IV SOLN
INTRAVENOUS | Status: DC | PRN
  Administered 2024-02-06: 141.2 mg via INTRAVENOUS

## 2024-02-06 MED ORDER — PROPOFOL 1000 MG/100ML IV EMUL
INTRAVENOUS | Status: AC
  Filled 2024-02-06: qty 100

## 2024-02-06 MED ORDER — ROCURONIUM BROMIDE 10 MG/ML (PF) SYRINGE
PREFILLED_SYRINGE | INTRAVENOUS | Status: DC | PRN
  Administered 2024-02-06: 50 mg via INTRAVENOUS

## 2024-02-06 MED ORDER — OXYCODONE HCL 5 MG PO TABS: 5.0000 mg | ORAL_TABLET | Freq: Once | ORAL | Status: DC | PRN

## 2024-02-06 MED ORDER — LIDOCAINE 2% (20 MG/ML) 5 ML SYRINGE
INTRAMUSCULAR | Status: DC | PRN
  Administered 2024-02-06: 60 mg via INTRAVENOUS

## 2024-02-06 MED ORDER — MIDAZOLAM HCL 2 MG/2ML IJ SOLN: 0.5000 mg | Freq: Once | INTRAMUSCULAR | Status: DC | PRN

## 2024-02-06 SURGICAL SUPPLY — 22 items
BUTTON OLYMPUS DEFENDO 5 PIECE (MISCELLANEOUS) ×1 IMPLANT
CANISTER SUCTION 3000ML PPV (SUCTIONS) ×1 IMPLANT
CNTNR URN SCR LID CUP LEK RST (MISCELLANEOUS) ×1 IMPLANT
FORCEPS GRASP COMBO 8X230 (FORCEP) IMPLANT
GAUZE SPONGE 4X4 12PLY STRL (GAUZE/BANDAGES/DRESSINGS) ×1 IMPLANT
GLOVE BIO SURGEON STRL SZ7 (GLOVE) ×1 IMPLANT
GLOVE BIO SURGEON STRL SZ7.5 (GLOVE) ×1 IMPLANT
GOWN STRL REUS W/ TWL XL LVL3 (GOWN DISPOSABLE) ×1 IMPLANT
GOWN STRL SURGICAL XL XLNG (GOWN DISPOSABLE) ×1 IMPLANT
MARKER SKIN DUAL TIP RULER LAB (MISCELLANEOUS) ×1 IMPLANT
OIL SILICONE PENTAX (PARTS (SERVICE/REPAIRS)) IMPLANT
PAD ARMBOARD POSITIONER FOAM (MISCELLANEOUS) ×2 IMPLANT
SOLN 0.9% NACL 1000 ML (IV SOLUTION) ×1 IMPLANT
SOLN 0.9% NACL POUR BTL 1000ML (IV SOLUTION) ×1 IMPLANT
SOLN STERILE WATER 1000 ML (IV SOLUTION) ×1 IMPLANT
SOLN STERILE WATER BTL 1000 ML (IV SOLUTION) ×1 IMPLANT
SYR 20ML ECCENTRIC (SYRINGE) ×1 IMPLANT
TOWEL GREEN STERILE (TOWEL DISPOSABLE) ×1 IMPLANT
TOWEL GREEN STERILE FF (TOWEL DISPOSABLE) ×1 IMPLANT
TUBE CONNECTING 20X1/4 (TUBING) ×1 IMPLANT
TUBING ENDO SMARTCAP (MISCELLANEOUS) ×1 IMPLANT
UNDERPAD 30X36 HEAVY ABSORB (UNDERPADS AND DIAPERS) ×1 IMPLANT

## 2024-02-06 NOTE — Transfer of Care (Signed)
 Immediate Anesthesia Transfer of Care Note  Patient: [name]  Procedure(s) Performed: EGD (ESOPHAGOGASTRODUODENOSCOPY) (Esophagus)  Patient Location: PACU  Anesthesia Type:General  Level of Consciousness: awake, patient cooperative, confused, and responds to stimulation  Airway & Oxygen Therapy: Patient Spontanous Breathing and Patient connected to face mask oxygen  Post-op Assessment: Report given to RN, Post -op Vital signs reviewed and stable, Patient moving all extremities X 4, and Patient able to stick tongue midline  Post vital signs: Reviewed and stable  Last Vitals:  Vitals Value Taken Time  BP 148/89 02/06/24 18:03  Temp 99.5   Pulse 118 02/06/24 18:06  Resp 23 02/06/24 18:06  SpO2 98 % 02/06/24 18:06  Vitals shown include unfiled device data.  Last Pain:  Vitals:   02/06/24 1556  TempSrc: Axillary  PainSc:       Patients Stated Pain Goal: 0 (01/17/24 0735)  Complications: No notable events documented.

## 2024-02-06 NOTE — Progress Notes (Signed)
     [address]       [name] [id number]             [phone]       Spoke with family They are agreeable with repositioning of the stent Will plan for this today  Linnie MALVA Rayas

## 2024-02-06 NOTE — Anesthesia Postprocedure Evaluation (Signed)
 Anesthesia Post Note  Patient: [name]  Procedure(s) Performed: EGD (ESOPHAGOGASTRODUODENOSCOPY) (Esophagus)     Patient location during evaluation: PACU Anesthesia Type: General Level of consciousness: awake and alert and confused Pain management: pain level controlled Vital Signs Assessment: post-procedure vital signs reviewed and stable Respiratory status: spontaneous breathing, nonlabored ventilation, respiratory function stable and patient connected to nasal cannula oxygen Cardiovascular status: blood pressure returned to baseline and stable Postop Assessment: no apparent nausea or vomiting Anesthetic complications: no   No notable events documented.  Last Vitals:  Vitals:   02/06/24 1827 02/06/24 1830  BP: (!) 163/100 (!) 164/103  Pulse: (!) 126 (!) 121  Resp: (!) 22 20  Temp:  36.8 C  SpO2: 94% 94%    Last Pain:  Vitals:   02/06/24 1804  TempSrc:   PainSc: 0-No pain                 Malin Sambrano,E. Johnnie Goynes
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
## 2024-02-06 NOTE — Progress Notes (Signed)
 14 Days Post-Op  Subjective: Remains very delirious. Precedex  resumed last night. WBC 20, hgb stable. Afebrile. Per RN patient did sleep overnight. Worked with PT yesterday.  Objective: Vital signs in last 24 hours: Temp:  [97 F (36.1 C)-98.4 F (36.9 C)] 98.1 F (36.7 C) (09/25 0800) Pulse Rate:  [88-119] 108 (09/25 0700) Resp:  [14-30] 24 (09/25 0700) BP: (93-168)/(55-105) 154/88 (09/25 0700) SpO2:  [83 %-99 %] 96 % (09/25 0700) FiO2 (%):  [32 %] 32 % (09/24 2352) Weight:  [70.6 kg] 70.6 kg (09/25 0500) Last BM Date : 02/05/24  Intake/Output from previous day: 09/24 0701 - 09/25 0700 In: 1980.1 [I.V.:108.4; NG/GT:1440; IV Piggyback:411.7] Out: 2358 [Urine:2300; Drains:58] Intake/Output this shift: No intake/output data recorded.  PE: General: alert, disoriented Resp: nonlabored respirations on nasal cannula. Pleural JP with minimal turbid fluid. CV: mild tachycardia low 100s, regular Abdomen: soft and nondistended. Upper midline incision clean and dry. J tube with feeds running at 60.    Lab Results:  Recent Labs    02/05/24 0500 02/06/24 0643  WBC 16.0* 20.4*  HGB 9.2* 9.9*  HCT 28.8* 31.5*  PLT 616* 599*   BMET Recent Labs    02/05/24 0500 02/06/24 0643  NA 141 139  K 3.9 4.3  CL 105 103  CO2 29 26  GLUCOSE 172* 185*  BUN 21 22  CREATININE 0.72 0.70  CALCIUM  8.7* 8.8*   PT/INR No results for input(s): LABPROT, INR in the last 72 hours.  CMP     Component Value Date/Time   NA 139 02/06/2024 0643   K 4.3 02/06/2024 0643   CL 103 02/06/2024 0643   CO2 26 02/06/2024 0643   GLUCOSE 185 (H) 02/06/2024 0643   BUN 22 02/06/2024 0643   CREATININE 0.70 02/06/2024 0643   CREATININE 0.85 10/10/2023 0824   CALCIUM  8.8 (L) 02/06/2024 0643   PROT 5.7 (L) 02/04/2024 0518   ALBUMIN  1.6 (L) 02/04/2024 0518   AST 27 02/04/2024 0518   AST 15 10/10/2023 0824   ALT 40 02/04/2024 0518   ALT 11 10/10/2023 0824   ALKPHOS 59 02/04/2024 0518    BILITOT 0.6 02/04/2024 0518   BILITOT 0.5 10/10/2023 0824   GFRNONAA >60 02/06/2024 0643   GFRNONAA >60 10/10/2023 0824   GFRAA  10/16/2007 1246    >60        The eGFR has been calculated using the MDRD equation. This calculation has not been validated in all clinical   Lipase  No results found for: LIPASE   Assessment/Plan  [name] is a 68 yo male with gastric cardia adenocarcinoma, POD22 s/p total gastrectomy, with distal esophagectomy and roux-en-Y esophagojejunostomy on 9/3. POD21 s/p takeback by thoracic surgery for RATS repair of anastomotic leak on 9/4. POD14 s/p EGD with placement of esophageal stent on 9/11. - S/p cardiac arrest night of 9/20, suspected aspiration event. Respiratory status improved. - EJ leak: Pleural JP bilious, output about 70 ml/day. Dr. Shyrl planning to reposition stent today. - Hold tube feeds for procedure, resume afterward. - Pain control: scheduled tylenol  and robaxin , decrease oxycodone  dose given delirium. - Delirium: On Clonidine  patch and Risperdal . Wean precedex  as able. Delirium precautions. - ID: On broad-spectrum abx and antifungal coverage for treatment of aspiration pneumonia and EJ leak.  - A-fib: in sinus rhythm. Metoprolol  via J tube. On therapeutic lovenox . - Mobilize, PT/OT following, recommending SNF at discharge. - Code status: DNR, continue full scope of medical care - Dispo: ICU  LOS: 22 days    Jon LITTIE Dawn, MD [facility] Surgery General, Hepatobiliary and Pancreatic Surgery 02/06/24 8:40 AM

## 2024-02-06 NOTE — Op Note (Signed)
      [address]       [name] [address]             [phone]        02/06/2024  Patient:  [name] Pre-Op Dx: hx of esophagojejunostomy Anastomotic leak   Post-op Dx:  same Procedure: - Esophagogastroscopy - Repositioning of esophageal stent  Surgeon and Role:      * [name], Jon KIDD, MD - Primary  Anesthesia  general EBL:  none Blood Administration: none Specimen:  none   Counts: correct   Indications: 68yo male with anastomotic leak, and malpositioned esophageal stent Findings: The stent was right at the anastomosis.  It was pulled back 5cm to completely cover the anastomosis.  Operative Technique: After the risks, benefits and alternatives were thoroughly discussed, the patient was brought to the operative theatre.  Anesthesia was induced. The patient was prepped and draped in normal sterile fashion.  An appropriate surgical pause was performed, and pre-operative antibiotics were dosed accordingly.  The gastroscope was advanced through the oropharynx into the cervical esophagus under direct visualization.  The stent was encountered at 35cm from the incisors.  It was right at the junction of the anastomosis.  The stent was grasped and pulled back 5 cm on fluoro guidance.      The patient tolerated the procedure without any immediate complications, and was transferred to the PACU in stable condition.  [name]

## 2024-02-06 NOTE — Anesthesia Preprocedure Evaluation (Addendum)
 Anesthesia Evaluation  Patient identified by MRN, date of birth, ID band Patient awake    Reviewed: Allergy & Precautions, NPO status , Patient's Chart, lab work & pertinent test results, reviewed documented beta blocker date and time   History of Anesthesia Complications Negative for: history of anesthetic complications  Airway Mallampati: II  TM Distance: >3 FB Neck ROM: Full    Dental  (+) Missing, Dental Advisory Given   Pulmonary COPD,  COPD inhaler, Current Smoker and Patient abstained from smoking. Chronically aspirating   breath sounds clear to auscultation       Cardiovascular hypertension, Pt. on medications and Pt. on home beta blockers (-) angina + CAD  + dysrhythmias Atrial Fibrillation  Rhythm:Regular Rate:Tachycardia  02/02/2024 ECHO: EF 55 to 60%.  1. The LV has normal function. Left ventricular endocardial border not optimally defined to evaluate regional wall motion. Left ventricular diastolic function could not be  evaluated. Technically difficult study- patient is on a ventilator and only parasternal and subcostal imaging could be performed.   2. RVF is hyperdynamic. The right ventricular size is normal.   3. The mitral valve is grossly normal. No evidence of mitral valve regurgitation.   4. The aortic valve is tricuspid. There is mild calcification of the aortic valve. Aortic valve regurgitation is not visualized.     Neuro/Psych  Headaches   Depression       GI/Hepatic ,GERD  ,,(+) Hepatitis -, CGastric cancer: gastroesophagectomy   Endo/Other  diabetes (glu 127), Oral Hypoglycemic Agents    Renal/GU Renal InsufficiencyRenal disease     Musculoskeletal  (+) Arthritis ,    Abdominal   Peds  Hematology Hb 9.9, plt 599k   Anesthesia Other Findings   Reproductive/Obstetrics                              Anesthesia Physical Anesthesia Plan  ASA: 4  Anesthesia Plan:  General   Post-op Pain Management: Minimal or no pain anticipated   Induction: Intravenous  PONV Risk Score and Plan: 1 and Ondansetron  and Dexamethasone   Airway Management Planned: Oral ETT  Additional Equipment: None  Intra-op Plan:   Post-operative Plan: Possible Post-op intubation/ventilation  Informed Consent: I have reviewed the patients History and Physical, chart, labs and discussed the procedure including the risks, benefits and alternatives for the proposed anesthesia with the patient or authorized representative who has indicated his/her understanding and acceptance.   Patient has DNR.  Discussed DNR with power of attorney and Suspend DNR.   Consent reviewed with POA  Plan Discussed with: CRNA and Surgeon  Anesthesia Plan Comments: (Discussed with pt's sister by telephone)         Anesthesia Quick Evaluation

## 2024-02-06 NOTE — Progress Notes (Signed)
 OT Cancellation Note  Patient Details Name: [name] MRN: [id number] DOB: 29-Apr-1956   Cancelled Treatment:    Reason Eval/Treat Not Completed: Medical issues which prohibited therapy.  Patient with procedure this date, and with increased agitation.  OT will hold today and check back 9/26.  Spoke with primary RN.    [name] D [name] 02/06/2024, 10:54 AM 02/06/2024  RP, OTR/L  Acute Rehabilitation Services  Office:  [phone]

## 2024-02-06 NOTE — Anesthesia Procedure Notes (Signed)
 Procedure Name: Intubation Date/Time: 02/06/2024 5:32 PM  Performed by: Harrold Macintosh, CRNAPre-anesthesia Checklist: Patient identified, Emergency Drugs available, Suction available and Patient being monitored Patient Re-evaluated:Patient Re-evaluated prior to induction Oxygen Delivery Method: Circle system utilized Preoxygenation: Pre-oxygenation with 100% oxygen Induction Type: IV induction Ventilation: Mask ventilation without difficulty Laryngoscope Size: Miller and 2 Grade View: Grade I Tube type: Oral Tube size: 7.5 mm Number of attempts: 1 Airway Equipment and Method: Stylet and Oral airway Placement Confirmation: ETT inserted through vocal cords under direct vision, positive ETCO2 and breath sounds checked- equal and bilateral Tube secured with: Tape Dental Injury: Teeth and Oropharynx as per pre-operative assessment

## 2024-02-06 NOTE — Plan of Care (Signed)
  Problem: Skin Integrity: Goal: Risk for impaired skin integrity will decrease Outcome: Progressing   Problem: Activity: Goal: Risk for activity intolerance will decrease Outcome: Progressing

## 2024-02-07 ENCOUNTER — Encounter (HOSPITAL_COMMUNITY): Payer: Self-pay | Admitting: Thoracic Surgery (Cardiothoracic Vascular Surgery)

## 2024-02-07 ENCOUNTER — Inpatient Hospital Stay (HOSPITAL_COMMUNITY)

## 2024-02-07 ENCOUNTER — Other Ambulatory Visit: Payer: Self-pay

## 2024-02-07 DIAGNOSIS — R092 Respiratory arrest: Secondary | ICD-10-CM

## 2024-02-07 DIAGNOSIS — C160 Malignant neoplasm of cardia: Secondary | ICD-10-CM | POA: Diagnosis not present

## 2024-02-07 DIAGNOSIS — Z9911 Dependence on respirator [ventilator] status: Secondary | ICD-10-CM

## 2024-02-07 LAB — POCT I-STAT 7, (LYTES, BLD GAS, ICA,H+H)
Acid-Base Excess: 5 mmol/L — ABNORMAL HIGH (ref 0.0–2.0)
Acid-Base Excess: 5 mmol/L — ABNORMAL HIGH (ref 0.0–2.0)
Bicarbonate: 33.7 mmol/L — ABNORMAL HIGH (ref 20.0–28.0)
Bicarbonate: 31 mmol/L — ABNORMAL HIGH (ref 20.0–28.0)
Calcium, Ion: 1.28 mmol/L (ref 1.15–1.40)
Calcium, Ion: 1.28 mmol/L (ref 1.15–1.40)
HCT: 27 % — ABNORMAL LOW (ref 39.0–52.0)
HCT: 30 % — ABNORMAL LOW (ref 39.0–52.0)
Hemoglobin: 9.2 g/dL — ABNORMAL LOW (ref 13.0–17.0)
Hemoglobin: 10.2 g/dL — ABNORMAL LOW (ref 13.0–17.0)
O2 Saturation: 99 %
O2 Saturation: 100 %
Patient temperature: 97.9
Patient temperature: 36.9
Potassium: 4.7 mmol/L (ref 3.5–5.1)
Potassium: 4.7 mmol/L (ref 3.5–5.1)
Sodium: 142 mmol/L (ref 135–145)
Sodium: 139 mmol/L (ref 135–145)
TCO2: 36 mmol/L — ABNORMAL HIGH (ref 22–32)
TCO2: 33 mmol/L — ABNORMAL HIGH (ref 22–32)
pCO2 arterial: 75.9 mmHg (ref 32–48)
pCO2 arterial: 52.8 mmHg — ABNORMAL HIGH (ref 32–48)
pH, Arterial: 7.253 — ABNORMAL LOW (ref 7.35–7.45)
pH, Arterial: 7.377 (ref 7.35–7.45)
pO2, Arterial: 144 mmHg — ABNORMAL HIGH (ref 83–108)
pO2, Arterial: 317 mmHg — ABNORMAL HIGH (ref 83–108)

## 2024-02-07 LAB — MRSA NEXT GEN BY PCR, NASAL: MRSA by PCR Next Gen: NOT DETECTED

## 2024-02-07 LAB — COMPREHENSIVE METABOLIC PANEL WITH GFR
ALT: 37 U/L (ref 0–44)
AST: 31 U/L (ref 15–41)
Albumin: <1.5 g/dL — ABNORMAL LOW (ref 3.5–5.0)
Alkaline Phosphatase: 44 U/L (ref 38–126)
Anion gap: 13 (ref 5–15)
BUN: 24 mg/dL — ABNORMAL HIGH (ref 8–23)
CO2: 28 mmol/L (ref 22–32)
Calcium: 8.8 mg/dL — ABNORMAL LOW (ref 8.9–10.3)
Chloride: 102 mmol/L (ref 98–111)
Creatinine, Ser: 0.82 mg/dL (ref 0.61–1.24)
GFR, Estimated: >60 mL/min (ref >60)
Glucose, Bld: 183 mg/dL — ABNORMAL HIGH (ref 70–99)
Potassium: 4.8 mmol/L (ref 3.5–5.1)
Sodium: 143 mmol/L (ref 135–145)
Total Bilirubin: 0.9 mg/dL (ref 0.0–1.2)
Total Protein: 5.1 g/dL — ABNORMAL LOW (ref 6.5–8.1)

## 2024-02-07 LAB — HEMOGLOBIN AND HEMATOCRIT, BLOOD
HCT: 35.8 % — ABNORMAL LOW (ref 39.0–52.0)
Hemoglobin: 10.9 g/dL — ABNORMAL LOW (ref 13.0–17.0)

## 2024-02-07 LAB — GLUCOSE, CAPILLARY
Glucose-Capillary: 148 mg/dL — ABNORMAL HIGH (ref 70–99)
Glucose-Capillary: 151 mg/dL — ABNORMAL HIGH (ref 70–99)
Glucose-Capillary: 162 mg/dL — ABNORMAL HIGH (ref 70–99)
Glucose-Capillary: 172 mg/dL — ABNORMAL HIGH (ref 70–99)
Glucose-Capillary: 174 mg/dL — ABNORMAL HIGH (ref 70–99)
Glucose-Capillary: 183 mg/dL — ABNORMAL HIGH (ref 70–99)
Glucose-Capillary: 193 mg/dL — ABNORMAL HIGH (ref 70–99)

## 2024-02-07 LAB — CULTURE, BLOOD (ROUTINE X 2)
Culture: NO GROWTH
Culture: NO GROWTH

## 2024-02-07 LAB — CBC
HCT: 20.8 % — ABNORMAL LOW (ref 39.0–52.0)
Hemoglobin: 6 g/dL — CL (ref 13.0–17.0)
MCH: 29.7 pg (ref 26.0–34.0)
MCHC: 28.8 g/dL — ABNORMAL LOW (ref 30.0–36.0)
MCV: 103 fL — ABNORMAL HIGH (ref 80.0–100.0)
Platelets: 366 K/uL (ref 150–400)
RBC: 2.02 MIL/uL — ABNORMAL LOW (ref 4.22–5.81)
RDW: 15.7 % — ABNORMAL HIGH (ref 11.5–15.5)
WBC: 15.1 K/uL — ABNORMAL HIGH (ref 4.0–10.5)
nRBC: 0 % (ref 0.0–0.2)

## 2024-02-07 LAB — PHOSPHORUS: Phosphorus: 6.9 mg/dL — ABNORMAL HIGH (ref 2.5–4.6)

## 2024-02-07 LAB — TROPONIN I (HIGH SENSITIVITY)
Troponin I (High Sensitivity): 11 ng/L (ref <18)
Troponin I (High Sensitivity): 8 ng/L (ref <18)

## 2024-02-07 LAB — MAGNESIUM: Magnesium: 2 mg/dL (ref 1.7–2.4)

## 2024-02-07 MED ORDER — ACETAMINOPHEN 160 MG/5ML PO SOLN
650.0000 mg | Freq: Three times a day (TID) | ORAL | Status: DC
  Administered 2024-02-07 – 2024-03-14 (×108): 650 mg via JEJUNOSTOMY
  Filled 2024-02-07 (×109): qty 20.3

## 2024-02-07 MED ORDER — PANTOPRAZOLE SODIUM 40 MG IV SOLR
40.0000 mg | Freq: Every day | INTRAVENOUS | Status: DC
  Administered 2024-02-07 – 2024-02-09 (×3): 40 mg via INTRAVENOUS
  Filled 2024-02-07 (×3): qty 10

## 2024-02-07 MED ORDER — NALOXONE HCL 0.4 MG/ML IJ SOLN
0.4000 mg | Freq: Once | INTRAMUSCULAR | Status: AC
  Administered 2024-02-07: 0.4 mg via INTRAVENOUS
  Filled 2024-02-07: qty 1

## 2024-02-07 MED ORDER — NOREPINEPHRINE 4 MG/250ML-% IV SOLN
INTRAVENOUS | Status: AC
  Administered 2024-02-07: 40 ug/min via INTRAVENOUS
  Filled 2024-02-07: qty 250

## 2024-02-07 MED ORDER — OXYCODONE HCL 20 MG/ML PO CONC: 5.0000 mg | ORAL | Status: DC | PRN

## 2024-02-07 MED ORDER — SODIUM BICARBONATE 8.4 % IV SOLN
INTRAVENOUS | Status: AC
  Filled 2024-02-07: qty 50

## 2024-02-07 MED ORDER — EPINEPHRINE HCL 5 MG/250ML IV SOLN IN NS
0.5000 ug/min | INTRAVENOUS | Status: DC
  Administered 2024-02-07: 0.5 ug/min via INTRAVENOUS

## 2024-02-07 MED ORDER — HALOPERIDOL LACTATE 5 MG/ML IJ SOLN: 2.0000 mg | Freq: Four times a day (QID) | INTRAMUSCULAR | Status: DC | PRN

## 2024-02-07 MED ORDER — FAMOTIDINE 20 MG PO TABS: 20.0000 mg | ORAL_TABLET | Freq: Two times a day (BID) | ORAL | Status: DC

## 2024-02-07 MED ORDER — OLANZAPINE 5 MG PO TBDP
10.0000 mg | ORAL_TABLET | Freq: Every day | ORAL | Status: DC
  Filled 2024-02-07: qty 2

## 2024-02-07 MED ORDER — CHLORHEXIDINE GLUCONATE CLOTH 2 % EX PADS
6.0000 | MEDICATED_PAD | Freq: Every day | CUTANEOUS | Status: DC
Start: 2024-02-08 — End: 2024-03-15
  Administered 2024-02-08 – 2024-03-14 (×38): 6 via TOPICAL

## 2024-02-07 MED ORDER — EPINEPHRINE 1 MG/10ML IV SOSY
PREFILLED_SYRINGE | INTRAVENOUS | Status: AC
  Administered 2024-02-07: 0.5 mg
  Filled 2024-02-07: qty 10

## 2024-02-07 MED ORDER — ONDANSETRON HCL 4 MG/5ML PO SOLN: 4.0000 mg | Freq: Three times a day (TID) | ORAL | Status: DC | PRN

## 2024-02-07 MED ORDER — POLYETHYLENE GLYCOL 3350 17 G PO PACK: 17.0000 g | PACK | Freq: Every day | ORAL | Status: DC

## 2024-02-07 MED ORDER — INSULIN ASPART 100 UNIT/ML IJ SOLN
0.0000 [IU] | INTRAMUSCULAR | Status: DC
  Administered 2024-02-07 (×4): 4 [IU] via SUBCUTANEOUS
  Administered 2024-02-08 (×3): 3 [IU] via SUBCUTANEOUS
  Administered 2024-02-08 (×2): 4 [IU] via SUBCUTANEOUS
  Administered 2024-02-09: 3 [IU] via SUBCUTANEOUS
  Administered 2024-02-09: 4 [IU] via SUBCUTANEOUS
  Administered 2024-02-09 (×2): 3 [IU] via SUBCUTANEOUS
  Administered 2024-02-09: 4 [IU] via SUBCUTANEOUS
  Administered 2024-02-10: 3 [IU] via SUBCUTANEOUS
  Administered 2024-02-10: 4 [IU] via SUBCUTANEOUS
  Administered 2024-02-10 (×3): 3 [IU] via SUBCUTANEOUS
  Administered 2024-02-11: 4 [IU] via SUBCUTANEOUS
  Administered 2024-02-11 – 2024-02-12 (×4): 3 [IU] via SUBCUTANEOUS
  Administered 2024-02-12 (×4): 4 [IU] via SUBCUTANEOUS
  Administered 2024-02-12 – 2024-02-13 (×3): 3 [IU] via SUBCUTANEOUS
  Administered 2024-02-13: 4 [IU] via SUBCUTANEOUS
  Administered 2024-02-13 – 2024-02-14 (×7): 3 [IU] via SUBCUTANEOUS

## 2024-02-07 MED ORDER — CLONIDINE HCL 0.2 MG/24HR TD PTWK: 0.2000 mg | MEDICATED_PATCH | TRANSDERMAL | Status: DC

## 2024-02-07 MED ORDER — NOREPINEPHRINE 16 MG/250ML-% IV SOLN
0.0000 ug/min | INTRAVENOUS | Status: DC
Start: 2024-02-07 — End: 2024-02-10
  Administered 2024-02-07: 40 ug/min via INTRAVENOUS
  Filled 2024-02-07: qty 250

## 2024-02-07 MED ORDER — VASOPRESSIN 20 UNITS/100 ML INFUSION FOR SHOCK
0.0000 [IU]/min | INTRAVENOUS | Status: DC
  Administered 2024-02-07 (×2): 0.03 [IU]/min via INTRAVENOUS
  Filled 2024-02-07 (×2): qty 100

## 2024-02-07 MED ORDER — ATROPINE SULFATE 1 MG/10ML IJ SOSY
PREFILLED_SYRINGE | INTRAMUSCULAR | Status: AC
  Filled 2024-02-07: qty 10

## 2024-02-07 MED ORDER — DOCUSATE SODIUM 50 MG/5ML PO LIQD: 100.0000 mg | Freq: Two times a day (BID) | ORAL | Status: DC

## 2024-02-07 MED ORDER — METHOCARBAMOL 1000 MG/10ML IJ SOLN: 500.0000 mg | Freq: Three times a day (TID) | INTRAMUSCULAR | Status: DC | PRN

## 2024-02-07 MED ORDER — NALOXONE HCL 0.4 MG/ML IJ SOLN: 0.4000 mg | INTRAMUSCULAR | Status: DC | PRN

## 2024-02-07 MED ORDER — MIDAZOLAM HCL 2 MG/2ML IJ SOLN: 1.0000 mg | INTRAMUSCULAR | Status: DC | PRN

## 2024-02-07 MED ORDER — EPINEPHRINE HCL 5 MG/250ML IV SOLN IN NS
INTRAVENOUS | Status: AC
  Filled 2024-02-07: qty 250

## 2024-02-07 MED ORDER — HALOPERIDOL LACTATE 5 MG/ML IJ SOLN
INTRAMUSCULAR | Status: AC
  Administered 2024-02-07: 2 mg via INTRAVENOUS
  Filled 2024-02-07: qty 1

## 2024-02-07 MED ORDER — EPINEPHRINE 1 MG/10ML IV SOSY
PREFILLED_SYRINGE | INTRAVENOUS | Status: AC
  Filled 2024-02-07: qty 10

## 2024-02-07 MED ORDER — LACTATED RINGERS IV BOLUS
500.0000 mL | Freq: Once | INTRAVENOUS | Status: AC
  Administered 2024-02-07: 1000 mL via INTRAVENOUS

## 2024-02-07 MED ORDER — VANCOMYCIN HCL 1500 MG/300ML IV SOLN
1500.0000 mg | Freq: Once | INTRAVENOUS | Status: AC
  Administered 2024-02-07: 1500 mg via INTRAVENOUS
  Filled 2024-02-07: qty 300

## 2024-02-07 MED ORDER — FENTANYL CITRATE PF 50 MCG/ML IJ SOSY
50.0000 ug | PREFILLED_SYRINGE | INTRAMUSCULAR | Status: DC | PRN
  Administered 2024-02-07: 50 ug via INTRAVENOUS
  Filled 2024-02-07: qty 1

## 2024-02-07 MED ORDER — OLANZAPINE 5 MG PO TBDP
5.0000 mg | ORAL_TABLET | Freq: Two times a day (BID) | ORAL | Status: DC
  Administered 2024-02-07: 5 mg
  Filled 2024-02-07 (×3): qty 1

## 2024-02-07 MED ORDER — CHLORHEXIDINE GLUCONATE CLOTH 2 % EX PADS: 6.0000 | MEDICATED_PAD | CUTANEOUS | Status: DC

## 2024-02-07 MED ORDER — NOREPINEPHRINE 4 MG/250ML-% IV SOLN
0.0000 ug/min | INTRAVENOUS | Status: DC
  Filled 2024-02-07 (×2): qty 250

## 2024-02-07 MED ORDER — VANCOMYCIN HCL IN DEXTROSE 1-5 GM/200ML-% IV SOLN
1000.0000 mg | Freq: Two times a day (BID) | INTRAVENOUS | Status: DC
  Administered 2024-02-07: 1000 mg via INTRAVENOUS
  Filled 2024-02-07 (×2): qty 200

## 2024-02-07 MED ORDER — NALOXONE HCL 0.4 MG/ML IJ SOLN
INTRAMUSCULAR | Status: AC
  Administered 2024-02-07: 0.4 mg via INTRAVENOUS
  Filled 2024-02-07: qty 1

## 2024-02-07 MED ORDER — HYDROCORTISONE SOD SUC (PF) 100 MG IJ SOLR
100.0000 mg | Freq: Two times a day (BID) | INTRAMUSCULAR | Status: DC
  Administered 2024-02-07 (×2): 100 mg via INTRAVENOUS
  Filled 2024-02-07 (×2): qty 2

## 2024-02-07 MED ORDER — ORAL CARE MOUTH RINSE: 15.0000 mL | OROMUCOSAL | Status: DC | PRN

## 2024-02-07 MED ORDER — FENTANYL CITRATE PF 50 MCG/ML IJ SOSY
50.0000 ug | PREFILLED_SYRINGE | INTRAMUSCULAR | Status: DC | PRN
  Administered 2024-02-07 (×2): 50 ug via INTRAVENOUS
  Administered 2024-02-07 (×2): 150 ug via INTRAVENOUS
  Administered 2024-02-07 – 2024-02-09 (×4): 100 ug via INTRAVENOUS
  Filled 2024-02-07: qty 1
  Filled 2024-02-07: qty 3
  Filled 2024-02-07: qty 1
  Filled 2024-02-07 (×6): qty 2

## 2024-02-07 MED ORDER — ORAL CARE MOUTH RINSE
15.0000 mL | OROMUCOSAL | Status: DC
  Administered 2024-02-07 – 2024-02-08 (×16): 15 mL via OROMUCOSAL

## 2024-02-07 NOTE — Progress Notes (Signed)
 Pharmacy Antibiotic Note  [name] is a 68 y.o. male admitted on 01/15/2024 with pneumonia.  Pharmacy has been consulted for vancomycin  dosing.  Plan: Give vancomycin  1500 mg IV loading dose, followed by 1000 mg q12h (eAUC 512).   Height: 5' 11 (180.3 cm) Weight: 70.6 kg (155 lb 10.3 oz) IBW/kg (Calculated) : 75.3  Temp (24hrs), Avg:99.3 F (37.4 C), Min:97.6 F (36.4 C), Max:102.8 F (39.3 C)  Recent Labs  Lab 02/02/24 0011 02/02/24 0422 02/02/24 0627 02/03/24 0359 02/04/24 0518 02/05/24 0500 02/06/24 0643 02/07/24 0155  WBC 32.1*  --    < > 27.3* 23.1* 16.0* 20.4* 15.1*  CREATININE 1.17  --    < > 0.93 0.76 0.72 0.70 0.82  LATICACIDVEN 1.6 2.0*  --   --   --   --   --   --    < > = values in this interval not displayed.    Estimated Creatinine Clearance: 87.3 mL/min (by C-G formula based on SCr of 0.82 mg/dL).    Allergies  Allergen Reactions   Dilaudid  [Hydromorphone ] Itching   Lisinopril Nausea Only    Antimicrobials this admission: Zosyn  9/9 >(9/18) Fluc 9/9 >(9/18) Meropenem  9/21 >>6 weeks  Micafungin  9/21 >>6 weeks Vancomycin  9/26 >>  Dose adjustments this admission: N/A  Microbiology results: 8/29 MRSA, MSSA neg 9/11 BCx - NGTD 9/21 Resp pseudomonas  9/21 Blood cx NGTD 9/21 Body Fluid Cx pseudomonas, citrobacter 9/26 Nasal MRSA: pending  9/26 Blood pending    Thank you for allowing pharmacy to be a part of this patient's care.  [name] M [name] 02/07/2024 12:25 PM

## 2024-02-07 NOTE — Progress Notes (Signed)
 eLink Physician-Brief Progress Note Patient Name: [name] DOB: 03-Jan-1956 MRN: [id number]   Date of Service  02/07/2024  HPI/Events of Note  Patient is somnolent with snoring respirations,  he has had multiple potentially sedating medications recently, including Robaxin  and narcotics, a total of 0.8 mg of Narcan  failed to significantly improve his mental status. No obvious focal neurological symptoms.  eICU Interventions  Stat ABG ordered, If ABG unremarkable and there is not significant clinical improvement in 15 to 30 minutes will scan his head, hold all sedatives.        Zaley Talley U Estil Vallee 02/07/2024, 12:56 AM

## 2024-02-07 NOTE — Progress Notes (Signed)
 eLink Physician-Brief Progress Note Patient Name: [name] DOB: 06-09-1955 MRN: [id number]   Date of Service  02/07/2024  HPI/Events of Note  Patient developed respiratory arrest and a brief cardiac arrest that responded to 1 amp of epinephrine  with ROSC, he was bagged and RT attempted intubation unsuccessfully, then PCCM arrived and intubated patient without difficulty. Interventions were performed due to uncertainty about his code status-reportedly DNR but not DNI (event primarily a respiratory event).  eICU Interventions          Ronasia Isola U Kaycen Whitworth 02/07/2024, 1:32 AM

## 2024-02-07 NOTE — Progress Notes (Signed)
 PT Cancellation Note  Patient Details Name: [name] MRN: [id number] DOB: 09-21-1955   Cancelled Treatment:    Reason Eval/Treat Not Completed: Medical issues which prohibited therapy - intubated for respiratory arrest, now intubated/sedated. PT to check back.   Emmerson Taddei S, PT DPT Acute Rehabilitation Services Secure Chat Preferred  Office [phone]    [name] 02/07/2024, 8:38 AM

## 2024-02-07 NOTE — Procedures (Signed)
 Intubation Procedure Note  [name]  [id number]  03-18-1956  Date:02/07/24  Time:1:36 AM   Provider Performing:Oneda Duffett W Rosan    Procedure: Intubation (31500)  Indication(s) Respiratory Failure  Consent Risks of the procedure as well as the alternatives and risks of each were explained to the patient and/or caregiver.  Consent for the procedure was obtained and is signed in the bedside chart   Anesthesia None, peri-arrest   Time Out Verified patient identification, verified procedure, site/side was marked, verified correct patient position, special equipment/implants available, medications/allergies/relevant history reviewed, required imaging and test results available.   Sterile Technique Usual hand hygeine, masks, and gloves were used   Procedure Description Patient positioned in bed supine.  Sedation given as noted above.  Patient was intubated with endotracheal tube using Glidescope.  View was Grade 2 only posterior commissure .  Number of attempts was 1.  Colorimetric CO2 detector was consistent with tracheal placement.   Complications/Tolerance None; patient tolerated the procedure well. Chest X-ray is ordered to verify placement.   EBL Minimal   Specimen(s) None   [name], AGACNP-BC [facility]  See Amion for personal pager PCCM on call pager [phone] until 7pm. Please call Elink 7p-7a. [phone]  02/07/2024 1:37 AM
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
## 2024-02-07 NOTE — Progress Notes (Signed)
 Nutrition Follow-up  DOCUMENTATION CODES:   Severe malnutrition in context of chronic illness  INTERVENTION:   Recommend resume tube feeding via J-tube: Osmolite 1.5 at 60 ml/h (1440 ml per day) Prosource TF20 60 ml BID  Provides 2320 kcal, 130 gm protein, 1094 ml free water  daily  NUTRITION DIAGNOSIS:   Severe Malnutrition related to cancer and cancer related treatments as evidenced by severe muscle depletion, moderate fat depletion, energy intake < or equal to 75% for > or equal to 1 month, percent weight loss (has lost 28 lbs, 15% in 6 months). Ongoing.  GOAL:   Patient will meet greater than or equal to 90% of their needs Progressing   MONITOR:   Diet advancement, TF tolerance, Labs, I & O's, Skin  REASON FOR ASSESSMENT:   Consult Assessment of nutrition requirement/status, Enteral/tube feeding initiation and management  ASSESSMENT:   [name] is a 68 yo male who was diagnosed with uT2N0 adenocarcinoma of the gastric cardia earlier this year. EUS showed evidence of invasion into the GE junction. The patient completed 3 cycles neoadjuvant FLOT with some complications, and was not able to tolerate further chemotherapy. [facility]ed to [facility] for surgery now post op total gastrectomy with distal esophagectomy, roux-en-Y esophagojejunostomy, J-tube. PMH of T2DM, GERD, HTN, a-fib, hernia repair.  Pt discussed during ICU rounds and with RN and MD.  Pt arrested overnight, now intubated. Pt on high pressor rates but are now coming down. Currently at 10 of levo and vaso.    9/3 - total gastrectomy with distal esophagectomy, roux-en-Y esophagojejunostomy, J-tube, chest tube, NGT placed to lIS  9/4 - s/p Thoracoscopy repair of esophageal leak using Myriad Matrix  9/6 - transferred to ICU for hypoxia and increased o2 requirements 9/7 - Trickle tube feeds started, 20 ml/hr 9/8 - Tube feeds advanced to 30 ml/hr, transferred to progressive  9/9 - Tube feeds advanced to 40 ml/hr  increase by 10 ml every 6 hours until goal of 60 ml/hr 9/10 - TF held 9/11 - Back to OR for leak, esophageal stent placed, intubated 9/12 - Extubated  9/20 - cardiac arrest with suspected aspiration event 9/25 - arrest overnight, ROSC with Epi; intubated  Medications reviewed and include:  Solucortef, SSI every 4 hours, protonix  Precedex  Levophed  @ 20 mcg Vasopressin  @ 0.03  Labs reviewed:  Iron < 10 (9/21) Vitamin B12 242 CBG's: 172-193  Chest JP: 25 ml 14 F J-tube  Current weight: 70.6 kg Admission weight: 80.7 kg    Diet Order:   Diet Order             Diet NPO time specified  Diet effective now                   EDUCATION NEEDS:   Education needs have been addressed  Skin:  Skin Assessment: Skin Integrity Issues: Skin Integrity Issues:: Incisions, Other (Comment) Incisions: 9/3 abdomen, 9/4 rt chest Other: right back wound, left buttocks wound  Last BM:  9/24  Height:   Ht Readings from Last 1 Encounters:  01/16/24 5' 11 (1.803 m)    Weight:   Wt Readings from Last 1 Encounters:  02/06/24 70.6 kg    Ideal Body Weight:  78.2 kg  BMI:  Body mass index is 21.71 kg/m.  Estimated Nutritional Needs:   Kcal:  2300-2500 kcal  Protein:  120-140 gm  Fluid:  >/=2L/day  Job Holtsclaw P., RD, LDN, CNSC See AMiON for contact information

## 2024-02-07 NOTE — Progress Notes (Signed)
     [address]       [place] [address]             [phone]       Intubated overnight Minimal CT output Continue abx and drainage  Lauren Modisette O Ameliarose Shark

## 2024-02-07 NOTE — Progress Notes (Addendum)
 PCCM INTERVAL PROGRESS NOTE   Called to bedside to evaluate patient in the setting of peri-arrest.   Upon my arrival to bedside patient being bagged. RN reports patient had become increasingly somnolent throughout the night. Precedex  discontinued. Narcan  given by EMD, no response. RNs report VF which converted back to NSR, then into PEA. Felt by bedside and EMD to be primarily respiratory arrest. Code status was interpreted as DNR but ACLS medications acceptable. Epi amp was given at the direction of EMD. Upon my arrival patient had ROSC. Was unresponsive and being bagged. I was able to intubate without any induction agents. Remained hypotensive refractory to norepi and epinephrine  infusions. Etiology of respiratory arrest not entirely clear. Aspiration observed during intubation  - Full rainbow of labs - ABG - CXR - Continue meropenem  - Family notified (sister - POA), aware he may not survive the night. She is contacting other family who may come in.   Critical care time 39 min   Deward Eastern, AGACNP-BC  Pulmonary & Critical Care  See Amion for personal pager PCCM on call pager [phone] until 7pm. Please call Elink 7p-7a. [phone]  02/07/2024 1:38 AM

## 2024-02-07 NOTE — TOC Progression Note (Signed)
 Transition of Care [facility]) - Progression Note    Patient Details  Name: [name] MRN: [id number] Date of Birth: [birth year]  Transition [facility] CM/SW Contact  Inocente GORMAN Kindle, LCSW Phone Number: 02/07/2024, 9:45 AM  Clinical Narrative:    Patient intubated today. CSW continuing to follow.    Expected Discharge Plan: Skilled Nursing Facility Barriers to Discharge: Continued Medical Work up, English as a second language teacher, SNF Pending bed offer               Expected Discharge Plan and Services     Post Acute Care Choice: Skilled Nursing Facility Living arrangements for the past 2 months: Single Family Home                                       Social Drivers of Health (SDOH) Interventions SDOH Screenings   Food Insecurity: No Food Insecurity (01/28/2024)  Housing: Low Risk  (01/28/2024)  Transportation Needs: No Transportation Needs (01/28/2024)  Utilities: Not At Risk (01/28/2024)  Depression (PHQ2-9): Low Risk  (11/25/2023)  Financial Resource Strain: Low Risk  (08/20/2023)  Social Connections: Unknown (01/28/2024)  Stress: No Stress Concern Present (08/20/2023)  Tobacco Use: High Risk (01/28/2024)    Readmission Risk Interventions    11/05/2023   12:23 PM  Readmission Risk Prevention Plan  Transportation Screening Complete  PCP or Specialist Appt within 3-5 Days Complete  HRI or Home Care Consult Complete  Social Work Consult for Recovery Care Planning/Counseling Complete  Palliative Care Screening Complete  Medication Review Oceanographer) Complete

## 2024-02-07 NOTE — Progress Notes (Signed)
 OT Cancellation Note  Patient Details Name: [name] MRN: [id number] DOB: [birth year]   Cancelled Treatment:    Reason Eval/Treat Not Completed: Medical issues which prohibited therapy.  Intubated for respiratory arrest, now intubated/sedated.    Caid Radin D Curtez Brallier 02/07/2024, 12:50 PM 02/07/2024  RP, OTR/L  Acute Rehabilitation Services  Office:  [phone]

## 2024-02-07 NOTE — Progress Notes (Signed)
 1 Day Post-Op  Subjective: Patient had EGD yesterday evening with repositioning of esophageal stent. Following the procedure, he reportedly became increasingly somnolent and developed a respiratory arrest. He was intubated and ABG showed a respiratory acidosis with pCO2 of 75. This morning he remains on the vent, sedated, on levophed  and epi.   Objective: Vital signs in last 24 hours: Temp:  [97.6 F (36.4 C)-98.3 F (36.8 C)] 97.6 F (36.4 C) (09/25 2000) Pulse Rate:  [65-184] 98 (09/26 0651) Resp:  [17-41] 21 (09/26 0651) BP: (61-164)/(37-112) 85/52 (09/26 0651) SpO2:  [45 %-100 %] 93 % (09/26 0651) FiO2 (%):  [100 %] 100 % (09/26 0347) Last BM Date : 02/05/24  Intake/Output from previous day: 09/25 0701 - 09/26 0700 In: 3282 [I.V.:1268; NG/GT:496; IV Piggyback:1518] Out: 1455 [Urine:1440; Drains:15] Intake/Output this shift: Total I/O In: 1758.4 [I.V.:439.3; NG/GT:426; IV Piggyback:893.1] Out: -   PE: General: intubated, sedated Resp: ETT in place, on vent. Pleural JP with thin bilious fluid. CV: RRR Abdomen: soft and nondistended. Upper midline incision clean and dry. J tube capped.    Lab Results:  Recent Labs    02/06/24 0643 02/07/24 0155 02/07/24 0158 02/07/24 0347 02/07/24 0526  WBC 20.4* 15.1*  --   --   --   HGB 9.9* 6.0*   < > 10.9* 10.2*  HCT 31.5* 20.8*   < > 35.8* 30.0*  PLT 599* 366  --   --   --    < > = values in this interval not displayed.   BMET Recent Labs    02/06/24 0643 02/07/24 0155 02/07/24 0158 02/07/24 0526  NA 139 143 142 139  K 4.3 4.8 4.7 4.7  CL 103 102  --   --   CO2 26 28  --   --   GLUCOSE 185* 183*  --   --   BUN 22 24*  --   --   CREATININE 0.70 0.82  --   --   CALCIUM  8.8* 8.8*  --   --    PT/INR No results for input(s): LABPROT, INR in the last 72 hours.  CMP     Component Value Date/Time   NA 139 02/07/2024 0526   K 4.7 02/07/2024 0526   CL 102 02/07/2024 0155   CO2 28 02/07/2024 0155    GLUCOSE 183 (H) 02/07/2024 0155   BUN 24 (H) 02/07/2024 0155   CREATININE 0.82 02/07/2024 0155   CREATININE 0.85 10/10/2023 0824   CALCIUM  8.8 (L) 02/07/2024 0155   PROT 5.1 (L) 02/07/2024 0155   ALBUMIN  <1.5 (L) 02/07/2024 0155   AST 31 02/07/2024 0155   AST 15 10/10/2023 0824   ALT 37 02/07/2024 0155   ALT 11 10/10/2023 0824   ALKPHOS 44 02/07/2024 0155   BILITOT 0.9 02/07/2024 0155   BILITOT 0.5 10/10/2023 0824   GFRNONAA >60 02/07/2024 0155   GFRNONAA >60 10/10/2023 0824   GFRAA  10/16/2007 1246    >60        The eGFR has been calculated using the MDRD equation. This calculation has not been validated in all clinical   Lipase  No results found for: LIPASE   Assessment/Plan  Mr. Kleine is a 68 yo male with gastric cardia adenocarcinoma, POD23 s/p total gastrectomy, with distal esophagectomy and roux-en-Y esophagojejunostomy on 9/3. POD22 s/p takeback by thoracic surgery for RATS repair of anastomotic leak on 9/4. POD15 s/p EGD with placement of esophageal stent on 9/11. - Patient had another  respiratory arrest overnight. Suspect this was a hypercarbic arrest secondary to multiple sedating meds and recent anesthesia, in the setting of underlying lung disease (patient is a lifelong smoker). Hypercarbia improved after intubation. Appreciate CCM assistance with vent management. - Wean pressors as able - EJ leak: S/p esophageal stent placement, repositioned yesterday by Dr. Shyrl.  - Tube feeds on hold, resume when able to wean pressors down. - ID: On broad-spectrum abx and antifungal coverage for treatment of aspiration pneumonia and EJ leak. To receive a 6-week course of abx per thoracic. - A-fib: in sinus rhythm. Hold metoprolol  given hypotension with pressor requirement. On therapeutic lovenox . - Code status: DNR, continue full scope of medical care - Dispo: ICU  Patient's nephew was at bedside and was updated on patient's status. Will also call patient's sister who is  his POA later this morning.    LOS: 23 days    [name], MD [facility] General, Hepatobiliary and Pancreatic Surgery 02/07/24 6:51 AM

## 2024-02-07 NOTE — Progress Notes (Addendum)
 Was notified @ 0254 that patient had coded and was intubated and administered epi per CCM team after asystolic cardiac arrest. Appreciate help from CCM team. Per bedside RN, patient more sonorous before event. ABG shows hypercarbia with CO2 of 76. Suspect hypercarbic arrest secondary to narcotics and sedating medications.  Will increase clonidine  patch, discontinue scheduled oxycodone  and make PRN only. Will start scheduled zyprexa  BID + higher dose nightly. Will try to consolidate sedating medications and pain medications.  Repeat ABG with pCO2 52, pO2 317. Will wean FiO2 with lower sat goals given COPD history.  Critical Care Time: 31 minutes  Orie Silversmith, MD [facility]

## 2024-02-08 DIAGNOSIS — C160 Malignant neoplasm of cardia: Secondary | ICD-10-CM | POA: Diagnosis not present

## 2024-02-08 DIAGNOSIS — J9602 Acute respiratory failure with hypercapnia: Secondary | ICD-10-CM

## 2024-02-08 DIAGNOSIS — G928 Other toxic encephalopathy: Secondary | ICD-10-CM | POA: Diagnosis not present

## 2024-02-08 DIAGNOSIS — J9601 Acute respiratory failure with hypoxia: Secondary | ICD-10-CM | POA: Diagnosis not present

## 2024-02-08 LAB — CBC WITH DIFFERENTIAL/PLATELET
Abs Immature Granulocytes: 0.22 K/uL — ABNORMAL HIGH (ref 0.00–0.07)
Basophils Absolute: 0.1 K/uL (ref 0.0–0.1)
Basophils Relative: 0 %
Eosinophils Absolute: 0 K/uL (ref 0.0–0.5)
Eosinophils Relative: 0 %
HCT: 28.1 % — ABNORMAL LOW (ref 39.0–52.0)
Hemoglobin: 9 g/dL — ABNORMAL LOW (ref 13.0–17.0)
Immature Granulocytes: 1 %
Lymphocytes Relative: 6 %
Lymphs Abs: 1.6 K/uL (ref 0.7–4.0)
MCH: 30 pg (ref 26.0–34.0)
MCHC: 32 g/dL (ref 30.0–36.0)
MCV: 93.7 fL (ref 80.0–100.0)
Monocytes Absolute: 1.3 K/uL — ABNORMAL HIGH (ref 0.1–1.0)
Monocytes Relative: 5 %
Neutro Abs: 21.3 K/uL — ABNORMAL HIGH (ref 1.7–7.7)
Neutrophils Relative %: 88 %
Platelets: 543 K/uL — ABNORMAL HIGH (ref 150–400)
RBC: 3 MIL/uL — ABNORMAL LOW (ref 4.22–5.81)
RDW: 15.5 % (ref 11.5–15.5)
WBC: 24.5 K/uL — ABNORMAL HIGH (ref 4.0–10.5)
nRBC: 0 % (ref 0.0–0.2)

## 2024-02-08 LAB — BASIC METABOLIC PANEL WITH GFR
Anion gap: 12 (ref 5–15)
BUN: 30 mg/dL — ABNORMAL HIGH (ref 8–23)
CO2: 26 mmol/L (ref 22–32)
Calcium: 9.3 mg/dL (ref 8.9–10.3)
Chloride: 103 mmol/L (ref 98–111)
Creatinine, Ser: 0.76 mg/dL (ref 0.61–1.24)
GFR, Estimated: >60 mL/min (ref >60)
Glucose, Bld: 162 mg/dL — ABNORMAL HIGH (ref 70–99)
Potassium: 3.9 mmol/L (ref 3.5–5.1)
Sodium: 141 mmol/L (ref 135–145)

## 2024-02-08 LAB — GLUCOSE, CAPILLARY
Glucose-Capillary: 158 mg/dL — ABNORMAL HIGH (ref 70–99)
Glucose-Capillary: 136 mg/dL — ABNORMAL HIGH (ref 70–99)
Glucose-Capillary: 150 mg/dL — ABNORMAL HIGH (ref 70–99)
Glucose-Capillary: 158 mg/dL — ABNORMAL HIGH (ref 70–99)
Glucose-Capillary: 137 mg/dL — ABNORMAL HIGH (ref 70–99)
Glucose-Capillary: 113 mg/dL — ABNORMAL HIGH (ref 70–99)

## 2024-02-08 LAB — PHOSPHORUS: Phosphorus: 3.8 mg/dL (ref 2.5–4.6)

## 2024-02-08 LAB — MAGNESIUM: Magnesium: 1.9 mg/dL (ref 1.7–2.4)

## 2024-02-08 MED ORDER — OSMOLITE 1.5 CAL PO LIQD
1000.0000 mL | ORAL | Status: DC
  Administered 2024-02-08: 1000 mL

## 2024-02-08 MED ORDER — HYDROCORTISONE SOD SUC (PF) 100 MG IJ SOLR
50.0000 mg | Freq: Two times a day (BID) | INTRAMUSCULAR | Status: DC
  Administered 2024-02-08 – 2024-02-09 (×3): 50 mg via INTRAVENOUS
  Filled 2024-02-08 (×3): qty 2

## 2024-02-08 MED ORDER — MAGNESIUM SULFATE 2 GM/50ML IV SOLN
2.0000 g | Freq: Once | INTRAVENOUS | Status: AC
  Administered 2024-02-08: 2 g via INTRAVENOUS
  Filled 2024-02-08: qty 50

## 2024-02-08 MED ORDER — PROSOURCE TF20 ENFIT COMPATIBL EN LIQD
60.0000 mL | Freq: Every day | ENTERAL | Status: DC
  Administered 2024-02-08 – 2024-02-09 (×2): 60 mL
  Filled 2024-02-08 (×2): qty 60

## 2024-02-08 NOTE — Procedures (Signed)
 Extubation Procedure Note  Patient Details:   Name: [name] DOB: August 13, 1955 MRN: [id number]   Airway Documentation:    Vent end date: 02/08/24 Vent end time: 0928   Evaluation  O2 sats: stable throughout Complications: No apparent complications Patient did tolerate procedure well. Bilateral Breath Sounds: Clear, Diminished   Yes  Pt was extubated and placed on HHFNC 40L/35% per MD order. Cuff leak was noted prior to extubation and no stridor post. Pt is stable at this time. RT will monitor.   Khadir Roam 02/08/2024, 9:32 AM

## 2024-02-08 NOTE — Plan of Care (Signed)

## 2024-02-08 NOTE — Progress Notes (Signed)
 2 Days Post-Op   Subjective/Chief Complaint: Pt weaned off some pressors Alert and communicating this AM   Objective: Vital signs in last 24 hours: Temp:  [98 F (36.7 C)-99.5 F (37.5 C)] 98.9 F (37.2 C) (09/27 0400) Pulse Rate:  [58-109] 82 (09/27 0800) Resp:  [9-26] 19 (09/27 0800) BP: (88-146)/(58-102) 145/81 (09/27 0800) SpO2:  [96 %-100 %] 98 % (09/27 0800) FiO2 (%):  [40 %] 40 % (09/27 0745) Last BM Date : 02/07/24  Intake/Output from previous day: 09/26 0701 - 09/27 0700 In: 1576 [I.V.:709.7; IV Piggyback:866.2] Out: 1105 [Urine:1085; Drains:20] Intake/Output this shift: Total I/O In: 11.6 [I.V.:11.6] Out: 33 [Drains:33]  PE: General: intubated, sedated Resp: ETT in place, on vent. Pleural JP with thin bilious fluid. CV: RRR Abdomen: soft and nondistended. Upper midline incision clean and dry. J tube capped.  Lab Results:  Recent Labs    02/07/24 0155 02/07/24 0158 02/07/24 0526 02/08/24 0639  WBC 15.1*  --   --  24.5*  HGB 6.0*   < > 10.2* 9.0*  HCT 20.8*   < > 30.0* 28.1*  PLT 366  --   --  543*   < > = values in this interval not displayed.   BMET Recent Labs    02/07/24 0155 02/07/24 0158 02/07/24 0526 02/08/24 0639  NA 143   < > 139 141  K 4.8   < > 4.7 3.9  CL 102  --   --  103  CO2 28  --   --  26  GLUCOSE 183*  --   --  162*  BUN 24*  --   --  30*  CREATININE 0.82  --   --  0.76  CALCIUM  8.8*  --   --  9.3   < > = values in this interval not displayed.   PT/INR No results for input(s): LABPROT, INR in the last 72 hours. ABG Recent Labs    02/07/24 0158 02/07/24 0526  PHART 7.253* 7.377  HCO3 33.7* 31.0*    Studies/Results: DG Chest Port 1 View Result Date: 02/07/2024 EXAM: 1 VIEW(S) XRAY OF THE CHEST 02/07/2024 01:39:52 AM COMPARISON: Comparison made to a study dated 23, 2025. CLINICAL HISTORY: Intubation of airway performed without difficulty. Intubation of airway performed without difficulty. FINDINGS: LINES, TUBES  AND DEVICES: Endotracheal tube has been placed 6 cm above the carina. Esophageal stent is in place extending from the supplemental region to the expected esophageal ampulla. Right subclavian chest port tip is unchanged within the superior vena cava. Tunneled right chest tube is in place, unchanged. LUNGS AND PLEURA: Extensive diffuse ground glass airspace infiltrate is identified, more focal within the left mid lung zone and right lung base, likely infectious or inflammatory in nature. No pneumothorax. No pleural effusion. HEART AND MEDIASTINUM: Cardiac size within normal limits. BONES AND SOFT TISSUES: No mention of bone or soft tissue abnormalities. IMPRESSION: 1. Extensive diffuse ground glass airspace infiltrate, more focal within the left mid lung zone and right lung base, likely infectious or inflammatory in nature. 2. No pneumothorax or pleural effusion. 3. Endotracheal tube, esophageal stent, right subclavian chest port, and tunneled right chest tube in appropriate positions. Electronically signed by: Dorethia Molt MD 02/07/2024 01:45 AM EDT RP Workstation: [id number]   DG C-Arm 1-60 Min-No Report Result Date: 02/06/2024 Fluoroscopy was utilized by the requesting physician.  No radiographic interpretation.    Anti-infectives: Anti-infectives (From admission, onward)    Start     Dose/Rate Route Frequency Ordered  Stop   02/07/24 2000  vancomycin  (VANCOCIN ) IVPB 1000 mg/200 mL premix  Status:  Discontinued        1,000 mg 200 mL/hr over 60 Minutes Intravenous Every 12 hours 02/07/24 1229 02/08/24 0725   02/07/24 0915  vancomycin  (VANCOREADY) IVPB 1500 mg/300 mL        1,500 mg 150 mL/hr over 120 Minutes Intravenous  Once 02/07/24 0823 02/07/24 1125   02/02/24 0400  micafungin  (MYCAMINE ) 100 mg in sodium chloride  0.9 % 100 mL IVPB        100 mg 105 mL/hr over 1 Hours Intravenous Daily 02/02/24 0248 03/15/24 1400   02/02/24 0400  meropenem  (MERREM ) 1,000 mg in sodium chloride  0.9 % 100 mL IVPB         1,000 mg 200 mL/hr over 30 Minutes Intravenous Every 8 hours 02/02/24 0248 03/15/24 1400   02/02/24 0315  vancomycin  (VANCOCIN ) IVPB 1000 mg/200 mL premix        1,000 mg 200 mL/hr over 60 Minutes Intravenous  Once 02/02/24 0228 02/02/24 0510   01/31/24 1800  piperacillin -tazobactam (ZOSYN ) IVPB 3.375 g  Status:  Discontinued        3.375 g 12.5 mL/hr over 240 Minutes Intravenous Every 8 hours 01/31/24 1639 02/02/24 0248   01/31/24 1745  fluconazole  (DIFLUCAN ) IVPB 400 mg  Status:  Discontinued        400 mg 100 mL/hr over 120 Minutes Intravenous Every 24 hours 01/31/24 1659 02/02/24 0248   01/22/24 1000  fluconazole  (DIFLUCAN ) IVPB 400 mg       Placed in Followed by Linked Group   400 mg 100 mL/hr over 120 Minutes Intravenous Every 24 hours 01/21/24 0956 01/30/24 1134   01/21/24 1045  fluconazole  (DIFLUCAN ) IVPB 800 mg       Placed in Followed by Linked Group   800 mg 200 mL/hr over 120 Minutes Intravenous  Once 01/21/24 0956 01/21/24 1441   01/21/24 0945  piperacillin -tazobactam (ZOSYN ) IVPB 3.375 g        3.375 g 12.5 mL/hr over 240 Minutes Intravenous Every 8 hours 01/21/24 0920 01/30/24 2216   01/16/24 1510  ceFAZolin  (ANCEF ) 2-4 GM/100ML-% IVPB       Note to Pharmacy: Cindie Pizza: cabinet override      01/16/24 1510 01/17/24 0314   01/15/24 0645  ceFAZolin  (ANCEF ) IVPB 2g/100 mL premix       Placed in And Linked Group   2 g 200 mL/hr over 30 Minutes Intravenous On call to O.R. 01/15/24 0641 01/15/24 1700   01/15/24 0645  metroNIDAZOLE  (FLAGYL ) IVPB 500 mg       Placed in And Linked Group   500 mg 100 mL/hr over 60 Minutes Intravenous On call to O.R. 01/15/24 9358 01/15/24 0910   01/15/24 9357  ceFAZolin  (ANCEF ) IVPB 2g/100 mL premix  Status:  Discontinued        2 g 200 mL/hr over 30 Minutes Intravenous 30 min pre-op 01/15/24 9357 01/15/24 0644       Assessment/Plan: Mr. Hearne is a 68 yo male with gastric cardia adenocarcinoma, POD24 s/p total  gastrectomy, with distal esophagectomy and roux-en-Y esophagojejunostomy on 9/3. POD23 s/p takeback by thoracic surgery for RATS repair of anastomotic leak on 9/4. POD16 s/p EGD with placement of esophageal stent on 9/11. - Patient had another respiratory arrest overnight. Suspect this was a hypercarbic arrest secondary to multiple sedating meds and recent anesthesia, in the setting of underlying lung disease (patient is a lifelong smoker). Hypercarbia improved  after intubation. Appreciate CCM assistance with vent management. - Weaning pressors - EJ leak: S/p esophageal stent placement, repositioned by Dr. Shyrl Everts.  - Tube feeds to resume today, will start trickle and increase when off pressors - ID: On broad-spectrum abx and antifungal coverage for treatment of aspiration pneumonia and EJ leak. To receive a 6-week course of abx per thoracic. - A-fib: in sinus rhythm. Hold metoprolol  given hypotension with pressor requirement. On therapeutic lovenox . - Code status: DNR, continue full scope of medical care - Dispo: ICU    LOS: 24 days    Lynda Leos 02/08/2024
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
## 2024-02-09 DIAGNOSIS — G928 Other toxic encephalopathy: Secondary | ICD-10-CM | POA: Diagnosis not present

## 2024-02-09 DIAGNOSIS — J9602 Acute respiratory failure with hypercapnia: Secondary | ICD-10-CM | POA: Diagnosis not present

## 2024-02-09 DIAGNOSIS — J9601 Acute respiratory failure with hypoxia: Secondary | ICD-10-CM | POA: Diagnosis not present

## 2024-02-09 DIAGNOSIS — C160 Malignant neoplasm of cardia: Secondary | ICD-10-CM | POA: Diagnosis not present

## 2024-02-09 LAB — CBC WITH DIFFERENTIAL/PLATELET
Abs Immature Granulocytes: 0.14 K/uL — ABNORMAL HIGH (ref 0.00–0.07)
Basophils Absolute: 0 K/uL (ref 0.0–0.1)
Basophils Relative: 0 %
Eosinophils Absolute: 0 K/uL (ref 0.0–0.5)
Eosinophils Relative: 0 %
HCT: 28.7 % — ABNORMAL LOW (ref 39.0–52.0)
Hemoglobin: 9.2 g/dL — ABNORMAL LOW (ref 13.0–17.0)
Immature Granulocytes: 1 %
Lymphocytes Relative: 10 %
Lymphs Abs: 1.7 K/uL (ref 0.7–4.0)
MCH: 29.5 pg (ref 26.0–34.0)
MCHC: 32.1 g/dL (ref 30.0–36.0)
MCV: 92 fL (ref 80.0–100.0)
Monocytes Absolute: 1.3 K/uL — ABNORMAL HIGH (ref 0.1–1.0)
Monocytes Relative: 7 %
Neutro Abs: 14.7 K/uL — ABNORMAL HIGH (ref 1.7–7.7)
Neutrophils Relative %: 82 %
Platelets: 575 K/uL — ABNORMAL HIGH (ref 150–400)
RBC: 3.12 MIL/uL — ABNORMAL LOW (ref 4.22–5.81)
RDW: 15.4 % (ref 11.5–15.5)
WBC: 17.9 K/uL — ABNORMAL HIGH (ref 4.0–10.5)
nRBC: 0 % (ref 0.0–0.2)

## 2024-02-09 LAB — GLUCOSE, CAPILLARY
Glucose-Capillary: 162 mg/dL — ABNORMAL HIGH (ref 70–99)
Glucose-Capillary: 120 mg/dL — ABNORMAL HIGH (ref 70–99)
Glucose-Capillary: 180 mg/dL — ABNORMAL HIGH (ref 70–99)
Glucose-Capillary: 149 mg/dL — ABNORMAL HIGH (ref 70–99)
Glucose-Capillary: 131 mg/dL — ABNORMAL HIGH (ref 70–99)
Glucose-Capillary: 125 mg/dL — ABNORMAL HIGH (ref 70–99)

## 2024-02-09 LAB — BASIC METABOLIC PANEL WITH GFR
Anion gap: 11 (ref 5–15)
BUN: 26 mg/dL — ABNORMAL HIGH (ref 8–23)
CO2: 29 mmol/L (ref 22–32)
Calcium: 9.1 mg/dL (ref 8.9–10.3)
Chloride: 104 mmol/L (ref 98–111)
Creatinine, Ser: 0.73 mg/dL (ref 0.61–1.24)
GFR, Estimated: >60 mL/min
Glucose, Bld: 113 mg/dL — ABNORMAL HIGH (ref 70–99)
Potassium: 3 mmol/L — ABNORMAL LOW (ref 3.5–5.1)
Sodium: 144 mmol/L (ref 135–145)

## 2024-02-09 LAB — MAGNESIUM: Magnesium: 2.1 mg/dL (ref 1.7–2.4)

## 2024-02-09 MED ORDER — LIDOCAINE 5 % EX PTCH
1.0000 | MEDICATED_PATCH | Freq: Once | CUTANEOUS | Status: AC
  Administered 2024-02-09: 1 via TRANSDERMAL
  Filled 2024-02-09: qty 1

## 2024-02-09 MED ORDER — FENTANYL CITRATE PF 50 MCG/ML IJ SOSY
12.5000 ug | PREFILLED_SYRINGE | INTRAMUSCULAR | Status: DC | PRN
  Administered 2024-02-09: 25 ug via INTRAVENOUS
  Administered 2024-02-09: 50 ug via INTRAVENOUS
  Administered 2024-02-09: 25 ug via INTRAVENOUS
  Administered 2024-02-09 (×2): 50 ug via INTRAVENOUS
  Administered 2024-02-10: 25 ug via INTRAVENOUS
  Administered 2024-02-10 – 2024-02-11 (×7): 50 ug via INTRAVENOUS
  Filled 2024-02-09 (×13): qty 1

## 2024-02-09 MED ORDER — POTASSIUM CHLORIDE 10 MEQ/100ML IV SOLN
10.0000 meq | INTRAVENOUS | Status: AC
  Administered 2024-02-09 (×6): 10 meq via INTRAVENOUS
  Filled 2024-02-09 (×6): qty 100

## 2024-02-09 NOTE — Progress Notes (Signed)
 eLink Physician-Brief Progress Note Patient Name: [name] DOB: 1955/10/16 MRN: [id number]   Date of Service  02/09/2024  HPI/Events of Note  Bedside RN states patient c/o back pain and has not slept much due to the discomfort.   Bedside RN asking if she can try a PRN Lidocaine  patch for the back? He has one scheduled but this was removed already and was on the chest not back.  Twice cardiac arrest, s/p extubated. Mostly from hyper capneic resp failure.    eICU Interventions  Lidocaine  patch ordered for back.      Intervention Category Intermediate Interventions: Pain - evaluation and management  Jodelle ONEIDA Hutching 02/09/2024, 2:49 AM

## 2024-02-09 NOTE — Progress Notes (Signed)
 3 Days Post-Op   Subjective/Chief Complaint: Did not have good sleep last night ETT out yesterday   Objective: Vital signs in last 24 hours: Temp:  [97.8 F (36.6 C)-99 F (37.2 C)] 98.7 F (37.1 C) (09/28 0800) Pulse Rate:  [68-119] 68 (09/28 0700) Resp:  [18-29] 21 (09/28 0700) BP: (125-177)/(73-107) 169/76 (09/28 0834) SpO2:  [87 %-100 %] 98 % (09/28 0700) FiO2 (%):  [30 %-35 %] 30 % (09/28 0834) Last BM Date : 02/08/24  Intake/Output from previous day: 09/27 0701 - 09/28 0700 In: 1088.3 [I.V.:268; NG/GT:395; IV Piggyback:425.3] Out: 1843 [Urine:1750; Drains:93] Intake/Output this shift: No intake/output data recorded.  PE: General: intubated, sedated Resp: ETT in place, on vent. Pleural JP with thin bilious fluid. CV: RRR Abdomen: soft and nondistended. Upper midline incision clean and dry. J tube  in place with J TFs ongoing  Lab Results:  Recent Labs    02/08/24 0639 02/09/24 0554  WBC 24.5* 17.9*  HGB 9.0* 9.2*  HCT 28.1* 28.7*  PLT 543* 575*   BMET Recent Labs    02/08/24 0639 02/09/24 0554  NA 141 144  K 3.9 3.0*  CL 103 104  CO2 26 29  GLUCOSE 162* 113*  BUN 30* 26*  CREATININE 0.76 0.73  CALCIUM  9.3 9.1   PT/INR No results for input(s): LABPROT, INR in the last 72 hours. ABG Recent Labs    02/07/24 0158 02/07/24 0526  PHART 7.253* 7.377  HCO3 33.7* 31.0*    Studies/Results: No results found.  Anti-infectives: Anti-infectives (From admission, onward)    Start     Dose/Rate Route Frequency Ordered Stop   02/07/24 2000  vancomycin  (VANCOCIN ) IVPB 1000 mg/200 mL premix  Status:  Discontinued        1,000 mg 200 mL/hr over 60 Minutes Intravenous Every 12 hours 02/07/24 1229 02/08/24 0725   02/07/24 0915  vancomycin  (VANCOREADY) IVPB 1500 mg/300 mL        1,500 mg 150 mL/hr over 120 Minutes Intravenous  Once 02/07/24 0823 02/07/24 1125   02/02/24 0400  micafungin  (MYCAMINE ) 100 mg in sodium chloride  0.9 % 100 mL IVPB         100 mg 105 mL/hr over 1 Hours Intravenous Daily 02/02/24 0248 03/15/24 1400   02/02/24 0400  meropenem  (MERREM ) 1,000 mg in sodium chloride  0.9 % 100 mL IVPB        1,000 mg 200 mL/hr over 30 Minutes Intravenous Every 8 hours 02/02/24 0248 03/15/24 1400   02/02/24 0315  vancomycin  (VANCOCIN ) IVPB 1000 mg/200 mL premix        1,000 mg 200 mL/hr over 60 Minutes Intravenous  Once 02/02/24 0228 02/02/24 0510   01/31/24 1800  piperacillin -tazobactam (ZOSYN ) IVPB 3.375 g  Status:  Discontinued        3.375 g 12.5 mL/hr over 240 Minutes Intravenous Every 8 hours 01/31/24 1639 02/02/24 0248   01/31/24 1745  fluconazole  (DIFLUCAN ) IVPB 400 mg  Status:  Discontinued        400 mg 100 mL/hr over 120 Minutes Intravenous Every 24 hours 01/31/24 1659 02/02/24 0248   01/22/24 1000  fluconazole  (DIFLUCAN ) IVPB 400 mg       Placed in Followed by Linked Group   400 mg 100 mL/hr over 120 Minutes Intravenous Every 24 hours 01/21/24 0956 01/30/24 1134   01/21/24 1045  fluconazole  (DIFLUCAN ) IVPB 800 mg       Placed in Followed by Linked Group   800 mg 200 mL/hr over 120  Minutes Intravenous  Once 01/21/24 0956 01/21/24 1441   01/21/24 0945  piperacillin -tazobactam (ZOSYN ) IVPB 3.375 g        3.375 g 12.5 mL/hr over 240 Minutes Intravenous Every 8 hours 01/21/24 0920 01/30/24 2216   01/16/24 1510  ceFAZolin  (ANCEF ) 2-4 GM/100ML-% IVPB       Note to Pharmacy: Cindie Pizza: cabinet override      01/16/24 1510 01/17/24 0314   01/15/24 0645  ceFAZolin  (ANCEF ) IVPB 2g/100 mL premix       Placed in And Linked Group   2 g 200 mL/hr over 30 Minutes Intravenous On call to O.R. 01/15/24 0641 01/15/24 1700   01/15/24 0645  metroNIDAZOLE  (FLAGYL ) IVPB 500 mg       Placed in And Linked Group   500 mg 100 mL/hr over 60 Minutes Intravenous On call to O.R. 01/15/24 9358 01/15/24 0910   01/15/24 9357  ceFAZolin  (ANCEF ) IVPB 2g/100 mL premix  Status:  Discontinued        2 g 200 mL/hr over 30 Minutes  Intravenous 30 min pre-op 01/15/24 9357 01/15/24 0644       Assessment/Plan: Mr. Divita is a 68 yo male with gastric cardia adenocarcinoma, POD25 s/p total gastrectomy, with distal esophagectomy and roux-en-Y esophagojejunostomy on 9/3. POD24 s/p takeback by thoracic surgery for RATS repair of anastomotic leak on 9/4. POD17 s/p EGD with placement of esophageal stent on 9/11. -Pt off vent.  HFNC. -Weaning pressors -EJ leak: S/p esophageal stent placement, repositioned by Dr. Shyrl Everts.  -Increase Tube feeds to goal as tol - ID: On broad-spectrum abx and antifungal coverage for treatment of aspiration pneumonia and EJ leak. To receive a 6-week course of abx per thoracic. - A-fib: in sinus rhythm. Hold metoprolol  given hypotension with pressor requirement. On therapeutic lovenox . - Code status: DNR, continue full scope of medical care - Dispo: ICU  LOS: 25 days    Lynda Leos 02/09/2024

## 2024-02-09 NOTE — Plan of Care (Signed)
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
## 2024-02-10 DIAGNOSIS — E43 Unspecified severe protein-calorie malnutrition: Secondary | ICD-10-CM

## 2024-02-10 DIAGNOSIS — J9601 Acute respiratory failure with hypoxia: Secondary | ICD-10-CM

## 2024-02-10 DIAGNOSIS — G929 Unspecified toxic encephalopathy: Secondary | ICD-10-CM

## 2024-02-10 HISTORY — DX: Acute respiratory failure with hypoxia: J96.01

## 2024-02-10 LAB — GLUCOSE, CAPILLARY
Glucose-Capillary: 133 mg/dL — ABNORMAL HIGH (ref 70–99)
Glucose-Capillary: 153 mg/dL — ABNORMAL HIGH (ref 70–99)
Glucose-Capillary: 126 mg/dL — ABNORMAL HIGH (ref 70–99)
Glucose-Capillary: 133 mg/dL — ABNORMAL HIGH (ref 70–99)
Glucose-Capillary: 135 mg/dL — ABNORMAL HIGH (ref 70–99)
Glucose-Capillary: 110 mg/dL — ABNORMAL HIGH (ref 70–99)

## 2024-02-10 LAB — BASIC METABOLIC PANEL WITH GFR
Anion gap: 10 (ref 5–15)
BUN: 23 mg/dL (ref 8–23)
CO2: 29 mmol/L (ref 22–32)
Calcium: 8.9 mg/dL (ref 8.9–10.3)
Chloride: 103 mmol/L (ref 98–111)
Creatinine, Ser: 0.65 mg/dL (ref 0.61–1.24)
GFR, Estimated: >60 mL/min (ref >60)
Glucose, Bld: 143 mg/dL — ABNORMAL HIGH (ref 70–99)
Potassium: 3.5 mmol/L (ref 3.5–5.1)
Sodium: 142 mmol/L (ref 135–145)

## 2024-02-10 LAB — CBC WITH DIFFERENTIAL/PLATELET
Abs Immature Granulocytes: 0.12 K/uL — ABNORMAL HIGH (ref 0.00–0.07)
Basophils Absolute: 0 K/uL (ref 0.0–0.1)
Basophils Relative: 0 %
Eosinophils Absolute: 0.3 K/uL (ref 0.0–0.5)
Eosinophils Relative: 2 %
HCT: 30.2 % — ABNORMAL LOW (ref 39.0–52.0)
Hemoglobin: 9.8 g/dL — ABNORMAL LOW (ref 13.0–17.0)
Immature Granulocytes: 1 %
Lymphocytes Relative: 11 %
Lymphs Abs: 1.8 K/uL (ref 0.7–4.0)
MCH: 29.8 pg (ref 26.0–34.0)
MCHC: 32.5 g/dL (ref 30.0–36.0)
MCV: 91.8 fL (ref 80.0–100.0)
Monocytes Absolute: 1.1 K/uL — ABNORMAL HIGH (ref 0.1–1.0)
Monocytes Relative: 7 %
Neutro Abs: 12.6 K/uL — ABNORMAL HIGH (ref 1.7–7.7)
Neutrophils Relative %: 79 %
Platelets: 563 K/uL — ABNORMAL HIGH (ref 150–400)
RBC: 3.29 MIL/uL — ABNORMAL LOW (ref 4.22–5.81)
RDW: 15.1 % (ref 11.5–15.5)
WBC: 15.9 K/uL — ABNORMAL HIGH (ref 4.0–10.5)
nRBC: 0 % (ref 0.0–0.2)

## 2024-02-10 LAB — MAGNESIUM: Magnesium: 1.9 mg/dL (ref 1.7–2.4)

## 2024-02-10 MED ORDER — ORAL CARE MOUTH RINSE
15.0000 mL | OROMUCOSAL | Status: DC
  Administered 2024-02-10 – 2024-02-14 (×17): 15 mL via OROMUCOSAL

## 2024-02-10 MED ORDER — OSMOLITE 1.5 CAL PO LIQD
1000.0000 mL | ORAL | Status: DC
  Administered 2024-02-10: 1000 mL

## 2024-02-10 MED ORDER — MAGNESIUM SULFATE 2 GM/50ML IV SOLN
2.0000 g | Freq: Once | INTRAVENOUS | Status: AC
  Administered 2024-02-10: 2 g via INTRAVENOUS
  Filled 2024-02-10: qty 50

## 2024-02-10 MED ORDER — OSMOLITE 1.5 CAL PO LIQD
1000.0000 mL | ORAL | Status: DC
  Administered 2024-02-10 – 2024-03-13 (×33): 1000 mL
  Filled 2024-02-10: qty 1000

## 2024-02-10 MED ORDER — METOPROLOL TARTRATE 25 MG/10 ML ORAL SUSPENSION
50.0000 mg | Freq: Two times a day (BID) | ORAL | Status: DC
  Administered 2024-02-10 – 2024-02-24 (×30): 50 mg via JEJUNOSTOMY
  Filled 2024-02-10 (×33): qty 20

## 2024-02-10 MED ORDER — POTASSIUM CHLORIDE 20 MEQ PO PACK
40.0000 meq | PACK | Freq: Once | ORAL | Status: AC
  Administered 2024-02-10: 40 meq
  Filled 2024-02-10: qty 2

## 2024-02-10 MED ORDER — PROSOURCE TF20 ENFIT COMPATIBL EN LIQD
60.0000 mL | Freq: Two times a day (BID) | ENTERAL | Status: DC
  Administered 2024-02-10 – 2024-03-14 (×67): 60 mL
  Filled 2024-02-10 (×67): qty 60

## 2024-02-10 MED ORDER — ORAL CARE MOUTH RINSE: 15.0000 mL | OROMUCOSAL | Status: DC | PRN

## 2024-02-10 NOTE — Progress Notes (Signed)
 Physical Therapy Treatment Patient Details Name: [name] MRN: [id number] DOB: [birth year] Today's Date: 02/10/2024   History of Present Illness 68 y.o. male presents to [facility] on 01/15/2024 with gastric CA. Pt underwent total gastrectomy with omentectomy and D2 lymphadenectomy, esophagojejunostomy with Jtube placement, splenic biopsy on 9/3. Pt developed bilious output into chest tube on 9/4, returned to OR for EGD and thoracoscopy with application of wound matrix to EJ anastomosis. ETT 9/11-9/12 for desats and unresponsiveness. S/p EGD and esophageal stent placement 9/11. Cardiac arrest 9/19. Re-ett 9/19-9/22. 9/26 PEA respiratory failure with intubation extubation 9/27 PMH includes asthma, OA, COPD, GERD, DMII, MDD, emphysema.    PT Comments  Pt demonstrating debility, impaired balance, and poor activity tolerance this date. Pt tolerating stand pivot OOB to recliner, but standing tolerance <10 seconds and significant chest wall pain suspect from arrest events. PT anticipates good progress with frequent OOB mobility, PT educated pt on the importance of mobility for strength, pulmonary health, pressure relief, etc. PT to continue to follow.      If plan is discharge home, recommend the following: A lot of help with walking and/or transfers;A lot of help with bathing/dressing/bathroom;Direct supervision/assist for medications management;Assistance with feeding;Help with stairs or ramp for entrance;Supervision due to cognitive status   Can travel by private vehicle        Equipment Recommendations  Other (comment) (tbd)    Recommendations for Other Services       Precautions / Restrictions Precautions Precautions: Fall Recall of Precautions/Restrictions: Impaired Precaution/Restrictions Comments: R JP drain x 1, J tube, fecal management system, R port Restrictions Weight Bearing Restrictions Per Provider Order: No     Mobility  Bed Mobility Overal bed mobility: Needs  Assistance Bed Mobility: Supine to Sit Rolling: Mod assist   Supine to sit: Mod assist     General bed mobility comments: exiting the bed on left side. pt with abdominal discomfort against gravity. pt with wet chest sounds that sounded better after pillow suppported cough    Transfers Overall transfer level: Needs assistance Equipment used: 2 person hand held assist Transfers: Sit to/from Stand, Bed to chair/wheelchair/BSC Sit to Stand: Min assist, From elevated surface, +2 physical assistance Stand pivot transfers: Min assist, +2 physical assistance, From elevated surface         General transfer comment: oob to chair with pillow support for arms and coughing. pt fatigued and sitting prematurely. pt required second attempt to scoot back into the chair    Ambulation/Gait                   Stairs             Wheelchair Mobility     Tilt Bed    Modified Rankin (Stroke Patients Only)       Balance Overall balance assessment: Needs assistance Sitting-balance support: Bilateral upper extremity supported, Feet supported Sitting balance-Leahy Scale: Fair Sitting balance - Comments: can sit EOB unsupported, but benefits from truncal steadying   Standing balance support: Bilateral upper extremity supported, During functional activity, Reliant on assistive device for balance Standing balance-Leahy Scale: Poor Standing balance comment: reliant on bilat UE support                            Communication Communication Communication: Impaired Factors Affecting Communication: Reduced clarity of speech  Cognition Arousal: Alert Behavior During Therapy: Flat affect  Following commands: Intact      Cueing Cueing Techniques: Verbal cues, Tactile cues  Exercises General Exercises - Lower Extremity Long Arc Quad: AROM, Both, 5 reps, Seated    General Comments General comments (skin integrity, edema, etc.):  HR 76, SpO2 97-99% on HFNC 6L, BP 145/80(100)      Pertinent Vitals/Pain Pain Assessment Pain Assessment: Faces Faces Pain Scale: Hurts even more Pain Location: chest wall Pain Descriptors / Indicators: Grimacing Pain Intervention(s): Limited activity within patient's tolerance, Monitored during session, Repositioned    Home Living                          Prior Function            PT Goals (current goals can now be found in the care plan section) Acute Rehab PT Goals Patient Stated Goal: to return to independence PT Goal Formulation: With patient Time For Goal Achievement: 02/14/24 Potential to Achieve Goals: Fair Progress towards PT goals: Progressing toward goals    Frequency    Min 2X/week      PT Plan      Co-evaluation PT/OT/SLP Co-Evaluation/Treatment: Yes Reason for Co-Treatment: Necessary to address cognition/behavior during functional activity;For patient/therapist safety;To address functional/ADL transfers PT goals addressed during session: Mobility/safety with mobility;Balance OT goals addressed during session: Proper use of Adaptive equipment and DME;ADL's and self-care;Strengthening/ROM      AM-PAC PT 6 Clicks Mobility   Outcome Measure  Help needed turning from your back to your side while in a flat bed without using bedrails?: A Lot Help needed moving from lying on your back to sitting on the side of a flat bed without using bedrails?: A Lot Help needed moving to and from a bed to a chair (including a wheelchair)?: A Lot Help needed standing up from a chair using your arms (e.g., wheelchair or bedside chair)?: A Lot Help needed to walk in hospital room?: Total Help needed climbing 3-5 steps with a railing? : Total 6 Click Score: 10    End of Session Equipment Utilized During Treatment: Oxygen Activity Tolerance: Patient limited by fatigue Patient left: with call bell/phone within reach;with family/visitor present;in chair;with  chair alarm set;with nursing/sitter in room Nurse Communication: Mobility status PT Visit Diagnosis: Other abnormalities of gait and mobility (R26.89);Muscle weakness (generalized) (M62.81);Pain     Time: 8780-8753 PT Time Calculation (min) (ACUTE ONLY): 27 min  Charges:    $Therapeutic Activity: 8-22 mins PT General Charges $$ ACUTE PT VISIT: 1 Visit                     [name], PT DPT Acute Rehabilitation Services Secure Chat Preferred  Office [phone]    Daleyssa Loiselle FORBES [place] 02/10/2024, 2:58 PM

## 2024-02-10 NOTE — Progress Notes (Signed)
 Nutrition Follow-up  DOCUMENTATION CODES:   Severe malnutrition in context of chronic illness  INTERVENTION:   Continue tube feeding via J-tube: Increase Osmolite 1.5 to 65 ml/h (1560 ml per day) Prosource TF20 60 ml BID  Provides 2500 kcal, 137 gm protein, 1185 ml free water  daily  NUTRITION DIAGNOSIS:   Severe Malnutrition related to cancer and cancer related treatments as evidenced by severe muscle depletion, moderate fat depletion, energy intake < or equal to 75% for > or equal to 1 month, percent weight loss (has lost 28 lbs, 15% in 6 months). Ongoing.  GOAL:   Patient will meet greater than or equal to 90% of their needs Met with TF now at goal   MONITOR:   Diet advancement, TF tolerance, Labs, I & O's, Skin  REASON FOR ASSESSMENT:   Consult Assessment of nutrition requirement/status, Enteral/tube feeding initiation and management  ASSESSMENT:   Mr. Schrecengost is a 68 yo male who was diagnosed with uT2N0 adenocarcinoma of the gastric cardia earlier this year. EUS showed evidence of invasion into the GE junction. The patient completed 3 cycles neoadjuvant FLOT with some complications, and was not able to tolerate further chemotherapy. [facility]ed to [facility] for surgery now post op total gastrectomy with distal esophagectomy, roux-en-Y esophagojejunostomy, J-tube. PMH of T2DM, GERD, HTN, a-fib, hernia repair.  Pt discussed during ICU rounds and with RN and MD.  Pt extubated 9/27 with TF resumed at trickle.    9/3 - total gastrectomy with distal esophagectomy, roux-en-Y esophagojejunostomy, J-tube, chest tube, NGT placed to lIS  9/4 - s/p Thoracoscopy repair of esophageal leak using Myriad Matrix  9/6 - transferred to ICU for hypoxia and increased o2 requirements 9/7 - Trickle tube feeds started, 20 ml/hr 9/8 - Tube feeds advanced to 30 ml/hr, transferred to progressive  9/9 - Tube feeds advanced to 40 ml/hr increase by 10 ml every 6 hours until goal of 60 ml/hr 9/10 - TF  held 9/11 - Back to OR for leak, esophageal stent placed, intubated 9/12 - Extubated  9/20 - cardiac arrest with suspected aspiration event 9/25 - arrest overnight, ROSC with Epi; intubated; TF held 9/27 - extubated; TF resumed at trickle 9/29 - TF to goal   Medications reviewed and include:  SSI every 4 hours Precedex    Labs reviewed:  Iron < 10, TIBC 234, Ferritin 130, Folate 15.1 (9/21) - Received 2 RBC infusions Vitamin B12 242 CBG's: 172-193  Chest JP: 70 ml 14 F J-tube  Current weight: 74.5 kg Admission weight: 80.7 kg    Diet Order:   Diet Order             Diet NPO time specified  Diet effective now                   EDUCATION NEEDS:   Education needs have been addressed  Skin:  Skin Assessment: Skin Integrity Issues: Skin Integrity Issues:: Incisions, Other (Comment) Incisions: 9/3 abdomen, 9/4 rt chest Other: right back wound, left buttocks wound  Last BM:  9/24  Height:   Ht Readings from Last 1 Encounters:  01/16/24 5' 11 (1.803 m)    Weight:   Wt Readings from Last 1 Encounters:  02/10/24 74.5 kg    Ideal Body Weight:  78.2 kg  BMI:  Body mass index is 22.91 kg/m.  Estimated Nutritional Needs:   Kcal:  2300-2500 kcal  Protein:  120-140 gm  Fluid:  >/=2L/day  Mostafa Yuan P., RD, LDN, CNSC See AMiON for  contact information

## 2024-02-10 NOTE — Treatment Plan (Signed)
 Occupational Therapy Treatment Patient Details Name: [name] MRN: [id number] DOB: [birth year] Today's Date: 02/10/2024   History of present illness 68 y.o. male presents to [facility] hospital on 01/15/2024 with gastric CA. Pt underwent total gastrectomy with omentectomy and D2 lymphadenectomy, esophagojejunostomy with Jtube placement, splenic biopsy on 9/3. Pt developed bilious output into chest tube on 9/4, returned to OR for EGD and thoracoscopy with application of wound matrix to EJ anastomosis. ETT 9/11-9/12 for desats and unresponsiveness. S/p EGD and esophageal stent placement 9/11. Cardiac arrest 9/19. Re-ett 9/19-9/22. 9/26 PEA respiratory failure with intubation extubation 9/27 PMH includes asthma, OA, COPD, GERD, DMII, MDD, emphysema.   OT comments  Pt oob to chair this session with two person (A). Hoyer lift pad placed for RN staff in case pt fatigues and needs physical (A) back to bed. Pt transferred from elevated surface so chair surface elevated. Pt fatigues quickly with recent second extubation9/27. Recommendation for skilled inpatient follow up therapy, <3 hours/day remains appropriate.       If plan is discharge home, recommend the following:  A lot of help with walking and/or transfers;Two people to help with walking and/or transfers;A lot of help with bathing/dressing/bathroom;Two people to help with bathing/dressing/bathroom;Assistance with cooking/housework;Assist for transportation;Help with stairs or ramp for entrance   Equipment Recommendations  Other (comment)    Recommendations for Other Services      Precautions / Restrictions Precautions Precautions: Fall Recall of Precautions/Restrictions: Impaired Precaution/Restrictions Comments: R JP drain x 1, J tube, fecal management system, R port Restrictions Weight Bearing Restrictions Per Provider Order: No       Mobility Bed Mobility Overal bed mobility: Needs Assistance Bed Mobility: Supine to Sit Rolling: Mod  assist   Supine to sit: Mod assist     General bed mobility comments: exiting the bed on left side. pt with abdominal discomfort against gravity. pt with wet chest sounds that sounded better after pillow suppported cough    Transfers Overall transfer level: Needs assistance Equipment used: 2 person hand held assist Transfers: Sit to/from Stand, Bed to chair/wheelchair/BSC Sit to Stand: Min assist, From elevated surface, +2 physical assistance Stand pivot transfers: Min assist, +2 physical assistance, From elevated surface         General transfer comment: oob to chair with pillow support for arms and coughing. pt fatigued and sitting premature. pt required second attempt to scoot back into the chair     Balance   Sitting-balance support: Bilateral upper extremity supported, Feet supported Sitting balance-Leahy Scale: Poor     Standing balance support: Bilateral upper extremity supported, During functional activity, Reliant on assistive device for balance Standing balance-Leahy Scale: Poor                             ADL either performed or assessed with clinical judgement   ADL Overall ADL's : Needs assistance/impaired Eating/Feeding: NPO   Grooming: Wash/dry face;Minimal assistance;Sitting               Lower Body Dressing: Maximal assistance                      Extremity/Trunk Assessment Upper Extremity Assessment Upper Extremity Assessment: Generalized weakness   Lower Extremity Assessment Lower Extremity Assessment: Generalized weakness        Vision       Perception     Praxis     Communication Communication Communication: Impaired Factors Affecting Communication: Reduced  clarity of speech   Cognition Arousal: Alert Behavior During Therapy: Flat affect Cognition: No family/caregiver present to determine baseline             OT - Cognition Comments: pt following commands and answering questions appropriately. pt does  not know how to utilize his cell phone. no family member present to help clarify if cell phone use is limited at baseline or new cognitive challenge                 Following commands: Intact        Cueing   Cueing Techniques: Verbal cues, Tactile cues  Exercises      Shoulder Instructions       General Comments HR76   O299 on HFNC 6L RR 99 BP 145/80(100)    Pertinent Vitals/ Pain       Pain Assessment Pain Assessment: Faces Faces Pain Scale: Hurts even more Pain Location: chest wall Pain Descriptors / Indicators: Grimacing Pain Intervention(s): Repositioned, Monitored during session, Limited activity within patient's tolerance  Home Living                                          Prior Functioning/Environment              Frequency  Min 2X/week        Progress Toward Goals  OT Goals(current goals can now be found in the care plan section)  Progress towards OT goals: Progressing toward goals  Acute Rehab OT Goals Patient Stated Goal: to call [name] sister-- pt assisted with phone OT Goal Formulation: With patient Time For Goal Achievement: 02/24/24 Potential to Achieve Goals: Fair ADL Goals Pt Will Perform Grooming: with min assist;standing Pt Will Perform Lower Body Dressing: with min assist;sit to/from stand Pt Will Transfer to Toilet: with min assist;ambulating  Plan      Co-evaluation    PT/OT/SLP Co-Evaluation/Treatment: Yes Reason for Co-Treatment: Necessary to address cognition/behavior during functional activity;For patient/therapist safety;To address functional/ADL transfers   OT goals addressed during session: Proper use of Adaptive equipment and DME;ADL's and self-care;Strengthening/ROM      AM-PAC OT 6 Clicks Daily Activity     Outcome Measure   Help from another person eating meals?: A Little Help from another person taking care of personal grooming?: A Little Help from another person toileting, which  includes using toliet, bedpan, or urinal?: A Lot Help from another person bathing (including washing, rinsing, drying)?: A Lot Help from another person to put on and taking off regular upper body clothing?: A Lot Help from another person to put on and taking off regular lower body clothing?: A Lot 6 Click Score: 14    End of Session Equipment Utilized During Treatment: Oxygen  OT Visit Diagnosis: Unsteadiness on feet (R26.81);Muscle weakness (generalized) (M62.81);Other symptoms and signs involving cognitive function;Pain   Activity Tolerance Patient tolerated treatment well   Patient Left in chair;with call bell/phone within reach;with chair alarm set   Nurse Communication Mobility status;Precautions        Time: 8780-8753 OT Time Calculation (min): 27 min  Charges: OT General Charges $OT Visit: 1 Visit OT Treatments $Self Care/Home Management : 8-22 mins   Brynn, OTR/L  Acute Rehabilitation Services Office: [phone] .   Ely Molt 02/10/2024, 1:45 PM

## 2024-02-10 NOTE — Plan of Care (Signed)
  Problem: Education: Goal: Ability to describe self-care measures that may prevent or decrease complications (Diabetes Survival Skills Education) will improve Outcome: Progressing Goal: Individualized Educational Video(s) Outcome: Progressing   Problem: Coping: Goal: Ability to adjust to condition or change in health will improve Outcome: Progressing   Problem: Fluid Volume: Goal: Ability to maintain a balanced intake and output will improve Outcome: Progressing   Problem: Metabolic: Goal: Ability to maintain appropriate glucose levels will improve Outcome: Progressing
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
## 2024-02-10 NOTE — Progress Notes (Signed)
 4 Days Post-Op  Subjective: Resting comfortably this morning. Per nursing he slept several hours overnight. Oriented to place and time this morning, able to converse but still mildly confused.  Objective: Vital signs in last 24 hours: Temp:  [97.6 F (36.4 C)-98.7 F (37.1 C)] 98.6 F (37 C) (09/29 0400) Pulse Rate:  [66-82] 74 (09/29 0725) Resp:  [18-26] 20 (09/29 0725) BP: (141-173)/(73-109) 149/102 (09/29 0700) SpO2:  [94 %-100 %] 97 % (09/29 0700) FiO2 (%):  [30 %] 30 % (09/29 0725) Weight:  [74.5 kg] 74.5 kg (09/29 0500) Last BM Date : 02/09/24  Intake/Output from previous day: 09/28 0701 - 09/29 0700 In: 1640.5 [I.V.:237.2; NG/GT:351.3; IV Piggyback:1052] Out: 2655 [Urine:2585; Drains:70] Intake/Output this shift: No intake/output data recorded.  PE: General: alert, resting comfortably, NAD Resp: nonlabored respirations on HFNC. Pleural JP with cloudy bile-tinged fluid. CV: RRR Abdomen: soft and nondistended. Upper midline incision clean and dry. J tube with feeds running at 30.    Lab Results:  Recent Labs    02/09/24 0554 02/10/24 0556  WBC 17.9* 15.9*  HGB 9.2* 9.8*  HCT 28.7* 30.2*  PLT 575* 563*   BMET Recent Labs    02/09/24 0554 02/10/24 0556  NA 144 142  K 3.0* 3.5  CL 104 103  CO2 29 29  GLUCOSE 113* 143*  BUN 26* 23  CREATININE 0.73 0.65  CALCIUM  9.1 8.9   PT/INR No results for input(s): LABPROT, INR in the last 72 hours.  CMP     Component Value Date/Time   NA 142 02/10/2024 0556   K 3.5 02/10/2024 0556   CL 103 02/10/2024 0556   CO2 29 02/10/2024 0556   GLUCOSE 143 (H) 02/10/2024 0556   BUN 23 02/10/2024 0556   CREATININE 0.65 02/10/2024 0556   CREATININE 0.85 10/10/2023 0824   CALCIUM  8.9 02/10/2024 0556   PROT 5.1 (L) 02/07/2024 0155   ALBUMIN  <1.5 (L) 02/07/2024 0155   AST 31 02/07/2024 0155   AST 15 10/10/2023 0824   ALT 37 02/07/2024 0155   ALT 11 10/10/2023 0824   ALKPHOS 44 02/07/2024 0155   BILITOT 0.9  02/07/2024 0155   BILITOT 0.5 10/10/2023 0824   GFRNONAA >60 02/10/2024 0556   GFRNONAA >60 10/10/2023 0824   GFRAA  10/16/2007 1246    >60        The eGFR has been calculated using the MDRD equation. This calculation has not been validated in all clinical   Lipase  No results found for: LIPASE   Assessment/Plan  [name] is a 68 yo male with gastric cardia adenocarcinoma, s/p total gastrectomy, with distal esophagectomy and roux-en-Y esophagojejunostomy on 9/3. S/p takeback by thoracic surgery for RATS repair of anastomotic leak on 9/4. S/p EGD with placement of esophageal stent on 9/11. S/p EGD with repositioning of esophageal stent 9/25. - Has had respiratory arrest x2 associated with hypercarbia. Extubated 9/27, on HFNC. Appreciate CCM assistance. - ICU delirium: wean precedex  as tolerated. - Pain control: scheduled tylenol , prn fentanyl . - EJ leak: S/p esophageal stent placement. Pleural JP to remain in place. On antibiotics and antifungal coverage per thoracic. - Advance tube feeds to goal. - ID: On broad-spectrum abx and antifungal coverage for treatment of aspiration pneumonia and EJ leak. To receive a 6-week course of abx per thoracic. - A-fib: remains in sinus rhythm. On therapeutic lovenox . - Code status: DNR, continue full scope of medical care - Dispo: ICU    LOS: 26 days  Jon LITTIE Dawn, MD [facility] Surgery General, Hepatobiliary and Pancreatic Surgery 02/10/24 7:36 AM

## 2024-02-11 DIAGNOSIS — R410 Disorientation, unspecified: Secondary | ICD-10-CM

## 2024-02-11 HISTORY — DX: Disorientation, unspecified: R41.0

## 2024-02-11 LAB — CBC
HCT: 36.5 % — ABNORMAL LOW (ref 39.0–52.0)
Hemoglobin: 11.5 g/dL — ABNORMAL LOW (ref 13.0–17.0)
MCH: 29.8 pg (ref 26.0–34.0)
MCHC: 31.5 g/dL (ref 30.0–36.0)
MCV: 94.6 fL (ref 80.0–100.0)
Platelets: 590 K/uL — ABNORMAL HIGH (ref 150–400)
RBC: 3.86 MIL/uL — ABNORMAL LOW (ref 4.22–5.81)
RDW: 15.4 % (ref 11.5–15.5)
WBC: 24.5 K/uL — ABNORMAL HIGH (ref 4.0–10.5)
nRBC: 0 % (ref 0.0–0.2)

## 2024-02-11 LAB — GLUCOSE, CAPILLARY
Glucose-Capillary: 150 mg/dL — ABNORMAL HIGH (ref 70–99)
Glucose-Capillary: 158 mg/dL — ABNORMAL HIGH (ref 70–99)
Glucose-Capillary: 129 mg/dL — ABNORMAL HIGH (ref 70–99)
Glucose-Capillary: 131 mg/dL — ABNORMAL HIGH (ref 70–99)
Glucose-Capillary: 118 mg/dL — ABNORMAL HIGH (ref 70–99)
Glucose-Capillary: 152 mg/dL — ABNORMAL HIGH (ref 70–99)

## 2024-02-11 LAB — BASIC METABOLIC PANEL WITH GFR
Anion gap: 11 (ref 5–15)
BUN: 23 mg/dL (ref 8–23)
CO2: 27 mmol/L (ref 22–32)
Calcium: 8.9 mg/dL (ref 8.9–10.3)
Chloride: 104 mmol/L (ref 98–111)
Creatinine, Ser: 0.6 mg/dL — ABNORMAL LOW (ref 0.61–1.24)
GFR, Estimated: >60 mL/min
Glucose, Bld: 152 mg/dL — ABNORMAL HIGH (ref 70–99)
Potassium: 3.9 mmol/L (ref 3.5–5.1)
Sodium: 142 mmol/L (ref 135–145)

## 2024-02-11 MED ORDER — HYDROCHLOROTHIAZIDE 25 MG PO TABS
25.0000 mg | ORAL_TABLET | Freq: Every day | ORAL | Status: DC
  Administered 2024-02-11: 25 mg
  Filled 2024-02-11: qty 1

## 2024-02-11 MED ORDER — HYDROCHLOROTHIAZIDE 10 MG/ML ORAL SUSPENSION
25.0000 mg | Freq: Every day | ORAL | Status: DC
  Administered 2024-02-12: 25 mg via JEJUNOSTOMY
  Filled 2024-02-11: qty 2.5
  Filled 2024-02-11: qty 25

## 2024-02-11 MED ORDER — OXYCODONE HCL 5 MG/5ML PO SOLN
5.0000 mg | ORAL | Status: DC | PRN
  Administered 2024-02-11 – 2024-03-14 (×108): 5 mg via JEJUNOSTOMY
  Filled 2024-02-11 (×110): qty 5

## 2024-02-11 MED ORDER — CLONIDINE HCL 0.1 MG/24HR TD PTWK
0.1000 mg | MEDICATED_PATCH | TRANSDERMAL | Status: DC
  Administered 2024-02-11: 0.1 mg via TRANSDERMAL
  Filled 2024-02-11: qty 1

## 2024-02-11 NOTE — Plan of Care (Signed)
  Problem: Education: Goal: Ability to describe self-care measures that may prevent or decrease complications (Diabetes Survival Skills Education) will improve Outcome: Progressing Goal: Individualized Educational Video(s) Outcome: Progressing   Problem: Coping: Goal: Ability to adjust to condition or change in health will improve Outcome: Progressing   Problem: Metabolic: Goal: Ability to maintain appropriate glucose levels will improve Outcome: Progressing

## 2024-02-11 NOTE — TOC Progression Note (Signed)
 Transition of Care [facility]) - Progression Note    Patient Details  Name: [name] MRN: [id number] Date of Birth: 1955/10/12  Transition of Care [facility]) CM/SW Contact  Inocente GORMAN Kindle, LCSW Phone Number: 02/11/2024, 9:42 AM  Clinical Narrative:    CSW continuing to follow for medical progression. Patient remains extubated.    Expected Discharge Plan: Skilled Nursing Facility Barriers to Discharge: Continued Medical Work up, English as a second language teacher, SNF Pending bed offer               Expected Discharge Plan and Services     Post Acute Care Choice: Skilled Nursing Facility Living arrangements for the past 2 months: Single Family Home                                       Social Drivers of Health (SDOH) Interventions SDOH Screenings   Food Insecurity: No Food Insecurity (01/28/2024)  Housing: Low Risk  (01/28/2024)  Transportation Needs: No Transportation Needs (01/28/2024)  Utilities: Not At Risk (01/28/2024)  Depression (PHQ2-9): Low Risk  (11/25/2023)  Financial Resource Strain: Low Risk  (08/20/2023)  Social Connections: Unknown (01/28/2024)  Stress: No Stress Concern Present (08/20/2023)  Tobacco Use: High Risk (01/28/2024)    Readmission Risk Interventions    11/05/2023   12:23 PM  Readmission Risk Prevention Plan  Transportation Screening Complete  PCP or Specialist Appt within 3-5 Days Complete  HRI or Home Care Consult Complete  Social Work Consult for Recovery Care Planning/Counseling Complete  Palliative Care Screening Complete  Medication Review Oceanographer) Complete

## 2024-02-11 NOTE — Progress Notes (Addendum)
 5 Days Post-Op  Subjective: Much more lucid this morning. Remains hemodynamically stable, afebrile. Slowly weaning precedex .   Objective: Vital signs in last 24 hours: Temp:  [97.8 F (36.6 C)-98.8 F (37.1 C)] 98.8 F (37.1 C) (09/30 0800) Pulse Rate:  [71-102] 91 (09/30 0700) Resp:  [15-44] 21 (09/30 0726) BP: (112-176)/(60-99) 172/95 (09/30 0700) SpO2:  [92 %-100 %] 97 % (09/30 0700) FiO2 (%):  [24 %] 24 % (09/30 0726) Weight:  [69.4 kg] 69.4 kg (09/30 0500) Last BM Date : 02/10/24  Intake/Output from previous day: 09/29 0701 - 09/30 0700 In: 2398.8 [I.V.:297.5; NG/GT:1526.3; IV Piggyback:555.1] Out: 2640 [Urine:2150; Drains:90; Stool:400] Intake/Output this shift: No intake/output data recorded.  PE: General: alert, resting comfortably, NAD, conversant Resp: nonlabored respirations on nasal cannula. Pleural JP with minimal purulent fluid. CV: RRR Abdomen: soft and nondistended. Upper midline incision clean and dry. J tube with feeds running at 65. GU: foley draining clear yellow urine    Lab Results:  Recent Labs    02/09/24 0554 02/10/24 0556  WBC 17.9* 15.9*  HGB 9.2* 9.8*  HCT 28.7* 30.2*  PLT 575* 563*   BMET Recent Labs    02/09/24 0554 02/10/24 0556  NA 144 142  K 3.0* 3.5  CL 104 103  CO2 29 29  GLUCOSE 113* 143*  BUN 26* 23  CREATININE 0.73 0.65  CALCIUM  9.1 8.9   PT/INR No results for input(s): LABPROT, INR in the last 72 hours.  CMP     Component Value Date/Time   NA 142 02/10/2024 0556   K 3.5 02/10/2024 0556   CL 103 02/10/2024 0556   CO2 29 02/10/2024 0556   GLUCOSE 143 (H) 02/10/2024 0556   BUN 23 02/10/2024 0556   CREATININE 0.65 02/10/2024 0556   CREATININE 0.85 10/10/2023 0824   CALCIUM  8.9 02/10/2024 0556   PROT 5.1 (L) 02/07/2024 0155   ALBUMIN  <1.5 (L) 02/07/2024 0155   AST 31 02/07/2024 0155   AST 15 10/10/2023 0824   ALT 37 02/07/2024 0155   ALT 11 10/10/2023 0824   ALKPHOS 44 02/07/2024 0155   BILITOT  0.9 02/07/2024 0155   BILITOT 0.5 10/10/2023 0824   GFRNONAA >60 02/10/2024 0556   GFRNONAA >60 10/10/2023 0824   GFRAA  10/16/2007 1246    >60        The eGFR has been calculated using the MDRD equation. This calculation has not been validated in all clinical   Lipase  No results found for: LIPASE   Assessment/Plan  [name] is a 68 yo male with gastric cardia adenocarcinoma, s/p total gastrectomy, with distal esophagectomy and roux-en-Y esophagojejunostomy on 9/3. S/p takeback by thoracic surgery for RATS repair of anastomotic leak on 9/4. S/p EGD with placement of esophageal stent on 9/11. S/p EGD with repositioning of esophageal stent 9/25. - Has had respiratory arrest x2 associated with hypercarbia. Extubated 9/27, weaning oxygen. - ICU delirium: seems to be significantly improving. Slowly weaning precedex . Clonidine  patch added today. - Pain control: scheduled tylenol , prn fentanyl . - Hypertension: home metoprolol  resume via J tube, will also resume home hydrochlorothiazide per tube today. Clonidine  patch started today by CCM. - EJ leak: S/p esophageal stent placement. Pleural JP to remain in place. On antibiotics and antifungal coverage per thoracic, will need 6 weeks total. - FEN: Continue J tube feeds at goal. Patient is tolerating and having bowel function. - GU: remove foley - ID: On broad-spectrum abx and antifungal coverage for treatment of aspiration  pneumonia and EJ leak. To receive a 6-week course of abx per thoracic. - A-fib: remains in sinus rhythm. On therapeutic lovenox . - Code status/dispo: DNR, continue full scope of medical care. I reviewed patient's code status with patient's sister [name], who is his POA, via phone this morning. I updated her on patient's status. She confirms that family would like to keep his current code status as is - they would like intubation and code medications, but no chest compressions. She feels this is what patient would want.   - Dispo: ICU. Discussed dispo planning with patient's sister - patient would like to go home but is not safe to go home at current level of mobility/deconditioning. PT currently recommending SNF, will see if patient is able to progress enough for CIR.    LOS: 27 days    Jon LITTIE Dawn, MD [facility] General, Hepatobiliary and Pancreatic Surgery 02/11/24 9:36 AM
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
## 2024-02-12 LAB — GLUCOSE, CAPILLARY
Glucose-Capillary: 135 mg/dL — ABNORMAL HIGH (ref 70–99)
Glucose-Capillary: 158 mg/dL — ABNORMAL HIGH (ref 70–99)
Glucose-Capillary: 160 mg/dL — ABNORMAL HIGH (ref 70–99)
Glucose-Capillary: 187 mg/dL — ABNORMAL HIGH (ref 70–99)
Glucose-Capillary: 173 mg/dL — ABNORMAL HIGH (ref 70–99)
Glucose-Capillary: 125 mg/dL — ABNORMAL HIGH (ref 70–99)

## 2024-02-12 LAB — BASIC METABOLIC PANEL WITH GFR
Anion gap: 8 (ref 5–15)
BUN: 25 mg/dL — ABNORMAL HIGH (ref 8–23)
CO2: 29 mmol/L (ref 22–32)
Calcium: 8.7 mg/dL — ABNORMAL LOW (ref 8.9–10.3)
Chloride: 105 mmol/L (ref 98–111)
Creatinine, Ser: 0.69 mg/dL (ref 0.61–1.24)
GFR, Estimated: >60 mL/min (ref >60)
Glucose, Bld: 139 mg/dL — ABNORMAL HIGH (ref 70–99)
Potassium: 3.8 mmol/L (ref 3.5–5.1)
Sodium: 142 mmol/L (ref 135–145)

## 2024-02-12 LAB — CBC
HCT: 33.5 % — ABNORMAL LOW (ref 39.0–52.0)
Hemoglobin: 10.7 g/dL — ABNORMAL LOW (ref 13.0–17.0)
MCH: 29.7 pg (ref 26.0–34.0)
MCHC: 31.9 g/dL (ref 30.0–36.0)
MCV: 93.1 fL (ref 80.0–100.0)
Platelets: 619 K/uL — ABNORMAL HIGH (ref 150–400)
RBC: 3.6 MIL/uL — ABNORMAL LOW (ref 4.22–5.81)
RDW: 15.4 % (ref 11.5–15.5)
WBC: 31.9 K/uL — ABNORMAL HIGH (ref 4.0–10.5)
nRBC: 0 % (ref 0.0–0.2)

## 2024-02-12 LAB — CULTURE, BLOOD (ROUTINE X 2)
Culture: NO GROWTH
Culture: NO GROWTH
Special Requests: ADEQUATE

## 2024-02-12 MED ORDER — FENTANYL CITRATE PF 50 MCG/ML IJ SOSY: 12.5000 ug | PREFILLED_SYRINGE | INTRAMUSCULAR | Status: DC | PRN

## 2024-02-12 MED ORDER — CLONIDINE HCL 0.2 MG/24HR TD PTWK
0.2000 mg | MEDICATED_PATCH | TRANSDERMAL | Status: DC
  Administered 2024-02-18: 0.2 mg via TRANSDERMAL
  Filled 2024-02-12: qty 1

## 2024-02-12 MED ORDER — ONDANSETRON HCL 4 MG/2ML IJ SOLN
4.0000 mg | Freq: Four times a day (QID) | INTRAMUSCULAR | Status: DC | PRN
  Administered 2024-02-21: 4 mg via INTRAVENOUS
  Filled 2024-02-12: qty 2

## 2024-02-12 NOTE — Discharge Summary (Addendum)
 Discharge Summary   Patient ID: [name] [id number] 68 y.o. 1955/06/20  Admit date: 01/15/2024  Discharge date and time: 03/13/2024  Admitting Physician: Jon LITTIE Dawn, MD   Discharge Provider/Physician: Jon Shaper, PA-C  Admission Diagnoses: Malignant neoplasm of cardia (HCC) [C16.0] Paroxysmal atrial fibrillation (HCC) [I48.0] Atherosclerosis of native coronary artery of native heart without angina pectoris [I25.10] Adenocarcinoma of gastric cardia (HCC) [C16.0]  Discharge Diagnoses: Malignant neoplasm of gastric cardia and gastroesophageal junction Esophagojejunal anastomotic leak Aspiration pneumonia Atrial fibrillation  Indication for Admission: [name] is a 68 yo male who was diagnosed with a gastric cardia cancer earlier this year. Staging workup including diagnostic laparoscopy showed no evidence of metastatic disease. He completed 3 cycles neoadjuvant FLOT with overall poor tolerance, and staging scans showed no evidence of disease progression. After a discussion of the risks and benefits of surgery, he consented to proceed. Please see separately dictated H&P.  Hospital Course: The patient was taken to the OR on 01/15/24 for an open total gastrectomy, with a roux-en-Y EJ anastomosis. His EJ anastomosis was done via a robotic transthoracic approach, with distal esophagectomy. Please see separately dictated operative notes for further details. Postoperatively he was admitted to the progressive care unit in stable condition. His pleural drains became bilious on POD1, and he was taken back to the OR for placement of additional sutures in the anastomosis and placement of a wound matrix. He was started on broad spectrum antibiotics and antifungal coverage. Over the next few days he had return of bowel function and jejunal tube feeds were started. He went into a-fib and cardiology was consulted, and he was started on diltiazem  and metoprolol . He later converted back to sinus rhythm.  His pleural drains again became bilious on POD7, and later that night he developed respiratory distress requiring intubation. He was taken on 9/11 for an EGD with placement of an esophageal stent. He was able to be extubated the following day. He had significant delirium over the next few days. He then had a respiratory arrest on 9/20 requiring reintubation, with a suspected aspiration event. He was transferred back to the ICU. His antibiotics were broadened and pressors were slowly weaned. He was extubated on 9/22 and pressors were weaned off. He continued to have issues with delirium, and his drain again became bilious. He was taken for an EGD with repositioning of the esophageal stent on 9/25. Following the procedure later that night, he became hypercarbic and had another respiratory arrest, and was again reintubated. He was weaned and extubated on 9/27, and mental status significantly improved. He was on precedex  for agitation and delirium, which was slowly weaned over the next few days. He continued to tolerate goal tube feeds. Psychiatry was consulted on 10/7 to determine competence for medical decision making and pre SNF evaluation. They determined that patient lacked medical decisional capacity regarding disposition as evidenced by lack of orientation and evasive responses to questions regarding disposition, his understanding of his own medical conditions, and risks/benefits to his choices. On 10/26 pleural JP drain inadvertently removed. Thoracic surgery has arranged for outpatient stent exchange. Patient continues to be on broad-spectrum antibiotics and antifungal coverage for treatment. Will need to receive a 6 week course of abx per thoracic. Patient worked with therapies during admission and was recommended for SNF. On 10/31 the patient was felt stable for discharge to SNF. Please see progress notes for more details.   Consults: Thoracic surgery, cardiology, psychiatry, CCM ,ID  Disposition:  SNF  Patient Instructions:  Allergies as of 03/13/2024       Reactions   Dilaudid  [hydromorphone ] Itching   Lisinopril Nausea Only        Medication List     PAUSE taking these medications    Linzess  145 MCG Caps capsule Wait to take this until your doctor or other care provider tells you to start again. Generic drug: linaclotide  Take 145 mcg by mouth at bedtime.   losartan 100 MG tablet Wait to take this until your doctor or other care provider tells you to start again. Commonly known as: COZAAR Take 100 mg by mouth daily.   Mens 50+ Multivitamin Tabs Wait to take this until your doctor or other care provider tells you to start again. Take 1 tablet by mouth daily.   metFORMIN 500 MG 24 hr tablet Wait to take this until your doctor or other care provider tells you to start again. Commonly known as: GLUCOPHAGE-XR Take 500 mg by mouth every evening.   pregabalin  25 MG capsule Wait to take this until your doctor or other care provider tells you to start again. Commonly known as: LYRICA  Take 25 mg by mouth at bedtime.   rosuvastatin  20 MG tablet Wait to take this until your doctor or other care provider tells you to start again. Commonly known as: CRESTOR  Take 20 mg by mouth daily.       STOP taking these medications    Buprenorphine  HCl 900 MCG Film   buPROPion  150 MG 24 hr tablet Commonly known as: WELLBUTRIN  XL   dexamethasone  4 MG tablet Commonly known as: DECADRON    diphenoxylate -atropine  2.5-0.025 MG tablet Commonly known as: LOMOTIL    Eliquis  5 MG Tabs tablet Generic drug: apixaban    hydrochlorothiazide 25 MG tablet Commonly known as: HYDRODIURIL Replaced by: hydrochlorothiazide 10 mg/mL Susp   lidocaine -prilocaine  cream Commonly known as: EMLA    methocarbamol  750 MG tablet Commonly known as: ROBAXIN    metoprolol  tartrate 50 MG tablet Commonly known as: LOPRESSOR  Replaced by: metoprolol  tartrate 25 mg/10 mL Susp   ondansetron  8 MG  disintegrating tablet Commonly known as: ZOFRAN -ODT   oxyCODONE -acetaminophen  10-325 MG tablet Commonly known as: PERCOCET   potassium chloride  SA 20 MEQ tablet Commonly known as: KLOR-CON  M   prochlorperazine  10 MG tablet Commonly known as: COMPAZINE    Vitamin D (Ergocalciferol) 1.25 MG (50000 UNIT) Caps capsule Commonly known as: DRISDOL       TAKE these medications    acetaminophen  160 MG/5ML solution Commonly known as: TYLENOL  20.3 mLs (650 mg total) by Per J Tube route every 8 (eight) hours.   albuterol  108 (90 Base) MCG/ACT inhaler Commonly known as: VENTOLIN  HFA Inhale 1-2 puffs into the lungs every 6 (six) hours as needed for shortness of breath or wheezing.   enoxaparin  80 MG/0.8ML injection Commonly known as: LOVENOX  Inject 0.7 mLs (70 mg total) into the skin every 12 (twelve) hours.   feeding supplement (OSMOLITE 1.5 CAL) Liqd Place 1,000 mLs into feeding tube continuous.   feeding supplement (PROSource TF20) liquid Place 60 mLs into feeding tube 2 (two) times daily.   free water  Soln Place 100 mLs into feeding tube every 4 (four) hours.   hydrochlorothiazide 10 mg/mL Susp Place 2.5 mLs (25 mg total) into feeding tube daily. Start taking on: March 14, 2024 Replaces: hydrochlorothiazide 25 MG tablet   lidocaine  5 % Commonly known as: LIDODERM  Place 2 patches onto the skin daily. Remove & Discard patch within 12 hours or as directed by MD Start taking on: March 14, 2024   meropenem  IVPB Commonly known as: MERREM  Inject 1 g into the vein every 8 (eight) hours for 14 days. Indication:  Esophageal leak First Dose: Yes Last Day of Therapy:  03/27/24 Labs - Once weekly:  CBC/D and BMP, Labs - Once weekly: ESR and CRP Method of administration: Mini-Bag Plus / Gravity or per SNF protocol (IVPB, etc.)   metoprolol  tartrate 25 mg/10 mL Susp Commonly known as: LOPRESSOR  25 mLs (62.5 mg total) by Per J Tube route 2 (two) times daily. Replaces:  metoprolol  tartrate 50 MG tablet   micafungin  50 MG injection Commonly known as: MYCAMINE  Inject 100 mg into the vein daily for 14 days. Indication:  Esophageal leak First Dose: Yes Last Day of Therapy:  03/27/24 Labs - Once weekly:  CBC/D and BMP,ESR and CRP Method of administration: Elastomeric or per SNF protocol (IVPB or IV infusion)   Narcan  4 MG/0.1ML Liqd nasal spray kit Generic drug: naloxone  Place 1 spray into the nose as needed (opioid overdose).   ondansetron  4 MG/2ML Soln injection Commonly known as: ZOFRAN  Inject 2 mLs (4 mg total) into the vein every 6 (six) hours as needed for nausea or vomiting.   oxyCODONE  5 MG/5ML solution Commonly known as: ROXICODONE  5 mLs (5 mg total) by Per J Tube route every 4 (four) hours as needed for severe pain (pain score 7-10).   risperiDONE  0.5 MG disintegrating tablet Commonly known as: RISPERDAL  M-TABS Place 1 tablet (0.5 mg total) under the tongue at bedtime.   sodium chloride  flush 0.9 % Soln Commonly known as: NS 10-40 mLs by Intracatheter route every 12 (twelve) hours.   sodium chloride  flush 0.9 % Soln Commonly known as: NS 10-40 mLs by Intracatheter route as needed (flush).   umeclidinium-vilanterol 62.5-25 MCG/ACT Aepb Commonly known as: ANORO ELLIPTA Inhale 1 puff into the lungs daily. Start taking on: March 14, 2024               Home Infusion Instuctions  (From admission, onward)           Start     Ordered   03/13/24 0000  Home infusion instructions       Comments: Planning discharge to SNF  Question:  Instructions  Answer:  Flushing of vascular access device: 0.9% NaCl pre/post medication administration and prn patency; Heparin  100 u/ml, 5ml for implanted ports and Heparin  10u/ml, 5ml for all other central venous catheters.   03/13/24 1535              Signed: Ozell HERO [facility] 03/13/2024 3:37 PM
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
## 2024-02-12 NOTE — Progress Notes (Signed)
 6 Days Post-Op  Subjective: Alert and oriented this morning. Has been off precedex  since yesterday afternoon. WBC up to 31 today, afebrile.   Objective: Vital signs in last 24 hours: Temp:  [97.9 F (36.6 C)-99.4 F (37.4 C)] 99.4 F (37.4 C) (10/01 0400) Pulse Rate:  [88-113] 113 (10/01 0700) Resp:  [17-28] 24 (10/01 0700) BP: (129-181)/(76-105) 158/97 (10/01 0700) SpO2:  [90 %-99 %] 93 % (10/01 0753) Weight:  [72.7 kg] 72.7 kg (10/01 0500) Last BM Date : 02/11/24  Intake/Output from previous day: 09/30 0701 - 10/01 0700 In: 1992 [I.V.:48.6; NG/GT:1560; IV Piggyback:383.4] Out: 2295 [Urine:1900; Drains:20; Stool:375] Intake/Output this shift: No intake/output data recorded.  PE: General: alert, resting comfortably, NAD, conversant Resp: nonlabored respirations on nasal cannula. Pleural JP with purulent fluid. CV: RRR Abdomen: soft and nondistended. Upper midline incision clean and dry. J tube with feeds running at 65.   Lab Results:  Recent Labs    02/11/24 0937 02/12/24 0307  WBC 24.5* 31.9*  HGB 11.5* 10.7*  HCT 36.5* 33.5*  PLT 590* 619*   BMET Recent Labs    02/11/24 0937 02/12/24 0307  NA 142 142  K 3.9 3.8  CL 104 105  CO2 27 29  GLUCOSE 152* 139*  BUN 23 25*  CREATININE 0.60* 0.69  CALCIUM  8.9 8.7*   PT/INR No results for input(s): LABPROT, INR in the last 72 hours.  CMP     Component Value Date/Time   NA 142 02/12/2024 0307   K 3.8 02/12/2024 0307   CL 105 02/12/2024 0307   CO2 29 02/12/2024 0307   GLUCOSE 139 (H) 02/12/2024 0307   BUN 25 (H) 02/12/2024 0307   CREATININE 0.69 02/12/2024 0307   CREATININE 0.85 10/10/2023 0824   CALCIUM  8.7 (L) 02/12/2024 0307   PROT 5.1 (L) 02/07/2024 0155   ALBUMIN  <1.5 (L) 02/07/2024 0155   AST 31 02/07/2024 0155   AST 15 10/10/2023 0824   ALT 37 02/07/2024 0155   ALT 11 10/10/2023 0824   ALKPHOS 44 02/07/2024 0155   BILITOT 0.9 02/07/2024 0155   BILITOT 0.5 10/10/2023 0824   GFRNONAA  >60 02/12/2024 0307   GFRNONAA >60 10/10/2023 0824   GFRAA  10/16/2007 1246    >60        The eGFR has been calculated using the MDRD equation. This calculation has not been validated in all clinical   Lipase  No results found for: LIPASE   Assessment/Plan  Mr. Mckeon is a 68 yo male with gastric cardia adenocarcinoma, s/p total gastrectomy, with distal esophagectomy and roux-en-Y esophagojejunostomy on 9/3. S/p takeback by thoracic surgery for RATS repair of anastomotic leak on 9/4. S/p EGD with placement of esophageal stent on 9/11. S/p EGD with repositioning of esophageal stent 9/25. - Has had respiratory arrest x2 associated with hypercarbia. Extubated 9/27, weaning oxygen. - ICU delirium: seems to be significantly improving. Slowly weaning precedex . Clonidine  patch added today. - Pain control: scheduled tylenol , prn fentanyl . - Hypertension: home metoprolol  and hydrochlorothiazide. On clonidine  patch for sedation and hypertension. - EJ leak: S/p esophageal stent placement. Pleural JP to remain in place. On antibiotics and antifungal coverage per thoracic, will need 6 weeks total. - FEN: Continue J tube feeds at goal. Patient is tolerating and having bowel function. - ID: On broad-spectrum abx and antifungal coverage for treatment of aspiration pneumonia and EJ leak. To receive a 6-week course of abx per thoracic. WBC trending up, but patient clinically appears well and  is on broad abx with drain in place. Continue to trend WBC, perform CT chest if this continues to rise. - A-fib: remains in sinus rhythm. On therapeutic lovenox . - Code status: Discussed with patient this morning as he is lucid. He expressed that in the event of a cardiopulmonary arrest he would want full interventions, including chest compressions, shocks, and intubation. I also discussed this with his sister [name] who is his POA. Clinical status has significantly improved since he was made DNR, and a sudden cardiac  arrest at this point may potentially be reversible. Code status has been updated to full code. I emphasized that we can always change this in the future if appropriate or if patient and family desire to change it. - Dispo: ICU, consider progressive care transfer if remains stable today. PT recommending SNF at discharge, patient wishes to go home. He currently needs a lot of help to get out of bed. Will see how he progresses with mobility. Can also consider CIR if he makes significant improvement in activity tolerance.   LOS: 28 days    [name], MD [facility] Surgery General, Hepatobiliary and Pancreatic Surgery 02/12/24 8:42 AM

## 2024-02-13 LAB — CBC
HCT: 31.5 % — ABNORMAL LOW (ref 39.0–52.0)
Hemoglobin: 9.9 g/dL — ABNORMAL LOW (ref 13.0–17.0)
MCH: 29.6 pg (ref 26.0–34.0)
MCHC: 31.4 g/dL (ref 30.0–36.0)
MCV: 94 fL (ref 80.0–100.0)
Platelets: 594 K/uL — ABNORMAL HIGH (ref 150–400)
RBC: 3.35 MIL/uL — ABNORMAL LOW (ref 4.22–5.81)
RDW: 15.9 % — ABNORMAL HIGH (ref 11.5–15.5)
WBC: 30.8 K/uL — ABNORMAL HIGH (ref 4.0–10.5)
nRBC: 0 % (ref 0.0–0.2)

## 2024-02-13 LAB — BASIC METABOLIC PANEL WITH GFR
Anion gap: 7 (ref 5–15)
BUN: 26 mg/dL — ABNORMAL HIGH (ref 8–23)
CO2: 29 mmol/L (ref 22–32)
Calcium: 8.8 mg/dL — ABNORMAL LOW (ref 8.9–10.3)
Chloride: 107 mmol/L (ref 98–111)
Creatinine, Ser: 0.76 mg/dL (ref 0.61–1.24)
GFR, Estimated: >60 mL/min (ref >60)
Glucose, Bld: 169 mg/dL — ABNORMAL HIGH (ref 70–99)
Potassium: 3.4 mmol/L — ABNORMAL LOW (ref 3.5–5.1)
Sodium: 143 mmol/L (ref 135–145)

## 2024-02-13 LAB — GLUCOSE, CAPILLARY
Glucose-Capillary: 150 mg/dL — ABNORMAL HIGH (ref 70–99)
Glucose-Capillary: 134 mg/dL — ABNORMAL HIGH (ref 70–99)
Glucose-Capillary: 143 mg/dL — ABNORMAL HIGH (ref 70–99)
Glucose-Capillary: 159 mg/dL — ABNORMAL HIGH (ref 70–99)
Glucose-Capillary: 144 mg/dL — ABNORMAL HIGH (ref 70–99)
Glucose-Capillary: 144 mg/dL — ABNORMAL HIGH (ref 70–99)

## 2024-02-13 MED ORDER — HYDROCHLOROTHIAZIDE 25 MG PO TABS: 25.0000 mg | ORAL_TABLET | Freq: Every day | ORAL | Status: DC

## 2024-02-13 MED ORDER — HYDROCHLOROTHIAZIDE 10 MG/ML ORAL SUSPENSION
25.0000 mg | Freq: Every day | ORAL | Status: DC
  Administered 2024-02-13 – 2024-03-14 (×31): 25 mg
  Filled 2024-02-13 (×10): qty 2.5
  Filled 2024-02-13: qty 25
  Filled 2024-02-13 (×6): qty 2.5
  Filled 2024-02-13: qty 25
  Filled 2024-02-13 (×10): qty 2.5
  Filled 2024-02-13: qty 25
  Filled 2024-02-13 (×4): qty 2.5

## 2024-02-13 MED ORDER — POTASSIUM CHLORIDE 20 MEQ PO PACK
40.0000 meq | PACK | Freq: Once | ORAL | Status: AC
  Administered 2024-02-13: 40 meq
  Filled 2024-02-13: qty 2

## 2024-02-13 NOTE — Progress Notes (Signed)
 Physical Therapy Treatment Patient Details Name: Keona Sheffler MRN: [id number] DOB: Mar 06, 1956 Today's Date: 02/13/2024   History of Present Illness 68 y.o. male presents to [facility] hospital on 01/15/2024 with gastric CA. Pt underwent total gastrectomy with omentectomy and D2 lymphadenectomy, esophagojejunostomy with Jtube placement, splenic biopsy on 9/3. Pt developed bilious output into chest tube on 9/4, returned to OR for EGD and thoracoscopy with application of wound matrix to EJ anastomosis. ETT 9/11-9/12 for desats and unresponsiveness. S/p EGD and esophageal stent placement 9/11. Cardiac arrest 9/19. Re-ett 9/19-9/22. 9/26 PEA respiratory failure with intubation extubation 9/27 PMH includes asthma, OA, COPD, GERD, DMII, MDD, emphysema.    PT Comments  Patient initially agreeable to work with PT. Performed LE exercises in supine as warm-up and then progressed to sitting EOB (min-mod assist of 1). Upon coming upright pt immediately reporting back pain and trying to lie back down. Able to get pt to agree to stand up and take steps toward HOB (+2 min assist) x 2 reps. Return to bed +2 mod assist. Will coordinate PT session with pain medication moving forward.     If plan is discharge home, recommend the following: A lot of help with walking and/or transfers;A lot of help with bathing/dressing/bathroom;Direct supervision/assist for medications management;Assistance with feeding;Help with stairs or ramp for entrance;Supervision due to cognitive status   Can travel by private vehicle        Equipment Recommendations  Other (comment) (tbd)    Recommendations for Other Services       Precautions / Restrictions Precautions Precautions: Fall Recall of Precautions/Restrictions: Impaired Precaution/Restrictions Comments: R JP drain x 1, J tube, R port Restrictions Weight Bearing Restrictions Per Provider Order: No     Mobility  Bed Mobility Overal bed mobility: Needs Assistance Bed Mobility:  Rolling, Sidelying to Sit, Sit to Sidelying Rolling: Min assist Sidelying to sit: Mod assist, HOB elevated     Sit to sidelying: Mod assist, +2 for physical assistance General bed mobility comments: exiting the bed on left side. pt with reports of back pain against gravity.    Transfers Overall transfer level: Needs assistance Equipment used: 2 person hand held assist Transfers: Sit to/from Stand Sit to Stand: Min assist, From elevated surface, +2 physical assistance (ICU bed at lowest height)           General transfer comment: pt refused OOB to chair; however stood x 2 and took small steps sideways toward [facility]    Ambulation/Gait               General Gait Details: pt refused to attempt; demanding to lie down   Stairs             Wheelchair Mobility     Tilt Bed    Modified Rankin (Stroke Patients Only)       Balance Overall balance assessment: Needs assistance Sitting-balance support: Bilateral upper extremity supported, Feet supported Sitting balance-Leahy Scale: Fair Sitting balance - Comments: can sit EOB unsupported, but benefits from truncal steadying   Standing balance support: Bilateral upper extremity supported, During functional activity, Reliant on assistive device for balance Standing balance-Leahy Scale: Poor Standing balance comment: reliant on bilat UE support                            Communication Communication Communication: Impaired Factors Affecting Communication: Reduced clarity of speech  Cognition Arousal: Alert Behavior During Therapy: Flat affect   PT - Cognitive impairments:  Orientation, Awareness, Memory, Problem solving, Safety/Judgement, Attention, Initiation                       PT - Cognition Comments: able to state month and hospital [facility] Following commands: Intact Following commands impaired: Follows one step commands inconsistently, Follows one step commands with increased time,  Follows multi-step commands inconsistently    Cueing Cueing Techniques: Verbal cues, Tactile cues  Exercises General Exercises - Lower Extremity Ankle Circles/Pumps: AROM, Both, 5 reps Heel Slides: AROM, Both, 5 reps    General Comments General comments (skin integrity, edema, etc.): VSS per telemetry      Pertinent Vitals/Pain Pain Assessment Pain Assessment: Faces Faces Pain Scale: Hurts whole lot Pain Location: back Pain Descriptors / Indicators: Grimacing, Moaning, Other (Comment) (resisting movement) Pain Intervention(s): Limited activity within patient's tolerance, Monitored during session, Repositioned    Home Living                          Prior Function            PT Goals (current goals can now be found in the care plan section) Acute Rehab PT Goals Patient Stated Goal: to return to independence Time For Goal Achievement: 02/14/24 Potential to Achieve Goals: Fair Progress towards PT goals: Not progressing toward goals - comment (pain limiting this session)    Frequency    Min 2X/week      PT Plan      Co-evaluation              AM-PAC PT 6 Clicks Mobility   Outcome Measure  Help needed turning from your back to your side while in a flat bed without using bedrails?: A Lot Help needed moving from lying on your back to sitting on the side of a flat bed without using bedrails?: A Lot Help needed moving to and from a bed to a chair (including a wheelchair)?: Total Help needed standing up from a chair using your arms (e.g., wheelchair or bedside chair)?: Total Help needed to walk in hospital room?: Total Help needed climbing 3-5 steps with a railing? : Total 6 Click Score: 8    End of Session Equipment Utilized During Treatment: Gait belt Activity Tolerance: Patient limited by pain Patient left: with call bell/phone within reach;with nursing/sitter in room;in bed;with bed alarm set Nurse Communication: Mobility status;Patient  requests pain meds PT Visit Diagnosis: Other abnormalities of gait and mobility (R26.89);Muscle weakness (generalized) (M62.81);Pain Pain - part of body:  (back)     Time: 8668-8644 PT Time Calculation (min) (ACUTE ONLY): 24 min  Charges:    $Gait Training: 8-22 mins $Therapeutic Activity: 8-22 mins PT General Charges $$ ACUTE PT VISIT: 1 Visit                      [name], PT Acute Rehabilitation Services  Office [phone]    [name] 02/13/2024, 3:19 PM

## 2024-02-13 NOTE — Progress Notes (Signed)
 Patient ID: Demarquez Ciolek, male   DOB: 19-Nov-1955, 68 y.o.   MRN: [id number] 7 Days Post-Op    Subjective: No complaints ROS negative except as listed above. Objective: Vital signs in last 24 hours: Temp:  [98.5 F (36.9 C)-100.4 F (38 C)] 100.4 F (38 C) (10/02 0400) Pulse Rate:  [90-112] 95 (10/02 0500) Resp:  [18-36] 26 (10/02 0300) BP: (116-178)/(69-99) 162/85 (10/02 0500) SpO2:  [92 %-96 %] 96 % (10/02 0730) Weight:  [68.5 kg] 68.5 kg (10/02 0500) Last BM Date : 02/12/24  Intake/Output from previous day: 10/01 0701 - 10/02 0700 In: 1201.4 [NG/GT:975; IV Piggyback:226.4] Out: 730 [Urine:700; Drains:30] Intake/Output this shift: No intake/output data recorded.  General appearance: alert and cooperative Resp: clear to auscultation bilaterally GI: soft, midline incision CDI, NT, J tube  Lab Results: CBC  Recent Labs    02/12/24 0307 02/13/24 0548  WBC 31.9* 30.8*  HGB 10.7* 9.9*  HCT 33.5* 31.5*  PLT 619* 594*   BMET Recent Labs    02/12/24 0307 02/13/24 0548  NA 142 143  K 3.8 3.4*  CL 105 107  CO2 29 29  GLUCOSE 139* 169*  BUN 25* 26*  CREATININE 0.69 0.76  CALCIUM  8.7* 8.8*   PT/INR No results for input(s): LABPROT, INR in the last 72 hours. ABG No results for input(s): PHART, HCO3 in the last 72 hours.  Invalid input(s): PCO2, PO2  Studies/Results: No results found.  Anti-infectives: Anti-infectives (From admission, onward)    Start     Dose/Rate Route Frequency Ordered Stop   02/07/24 2000  vancomycin  (VANCOCIN ) IVPB 1000 mg/200 mL premix  Status:  Discontinued        1,000 mg 200 mL/hr over 60 Minutes Intravenous Every 12 hours 02/07/24 1229 02/08/24 0725   02/07/24 0915  vancomycin  (VANCOREADY) IVPB 1500 mg/300 mL        1,500 mg 150 mL/hr over 120 Minutes Intravenous  Once 02/07/24 0823 02/07/24 1125   02/02/24 0400  micafungin  (MYCAMINE ) 100 mg in sodium chloride  0.9 % 100 mL IVPB        100 mg 105 mL/hr over 1 Hours  Intravenous Daily 02/02/24 0248 03/15/24 1400   02/02/24 0400  meropenem  (MERREM ) 1,000 mg in sodium chloride  0.9 % 100 mL IVPB        1,000 mg 200 mL/hr over 30 Minutes Intravenous Every 8 hours 02/02/24 0248 03/15/24 1400   02/02/24 0315  vancomycin  (VANCOCIN ) IVPB 1000 mg/200 mL premix        1,000 mg 200 mL/hr over 60 Minutes Intravenous  Once 02/02/24 0228 02/02/24 0510   01/31/24 1800  piperacillin -tazobactam (ZOSYN ) IVPB 3.375 g  Status:  Discontinued        3.375 g 12.5 mL/hr over 240 Minutes Intravenous Every 8 hours 01/31/24 1639 02/02/24 0248   01/31/24 1745  fluconazole  (DIFLUCAN ) IVPB 400 mg  Status:  Discontinued        400 mg 100 mL/hr over 120 Minutes Intravenous Every 24 hours 01/31/24 1659 02/02/24 0248   01/22/24 1000  fluconazole  (DIFLUCAN ) IVPB 400 mg       Placed in Followed by Linked Group   400 mg 100 mL/hr over 120 Minutes Intravenous Every 24 hours 01/21/24 0956 01/30/24 1134   01/21/24 1045  fluconazole  (DIFLUCAN ) IVPB 800 mg       Placed in Followed by Linked Group   800 mg 200 mL/hr over 120 Minutes Intravenous  Once 01/21/24 0956 01/21/24 1441   01/21/24 0945  piperacillin -tazobactam (ZOSYN ) IVPB 3.375 g        3.375 g 12.5 mL/hr over 240 Minutes Intravenous Every 8 hours 01/21/24 0920 01/30/24 2216   01/16/24 1510  ceFAZolin  (ANCEF ) 2-4 GM/100ML-% IVPB       Note to Pharmacy: Cindie Pizza: cabinet override      01/16/24 1510 01/17/24 0314   01/15/24 0645  ceFAZolin  (ANCEF ) IVPB 2g/100 mL premix       Placed in And Linked Group   2 g 200 mL/hr over 30 Minutes Intravenous On call to O.R. 01/15/24 0641 01/15/24 1700   01/15/24 0645  metroNIDAZOLE  (FLAGYL ) IVPB 500 mg       Placed in And Linked Group   500 mg 100 mL/hr over 60 Minutes Intravenous On call to O.R. 01/15/24 9358 01/15/24 0910   01/15/24 9357  ceFAZolin  (ANCEF ) IVPB 2g/100 mL premix  Status:  Discontinued        2 g 200 mL/hr over 30 Minutes Intravenous 30 min pre-op 01/15/24 9357  01/15/24 0644       Assessment/Plan: Mr. Marcell is a 68 yo male with gastric cardia adenocarcinoma, s/p total gastrectomy, with distal esophagectomy and roux-en-Y esophagojejunostomy on 9/3. S/p takeback by thoracic surgery for RATS repair of anastomotic leak on 9/4. S/p EGD with placement of esophageal stent on 9/11. S/p EGD with repositioning of esophageal stent 9/25. - Has had respiratory arrest x2 associated with hypercarbia. Extubated 9/27, weaning oxygen. - ICU delirium: this has improved a lot, Clonidine  patch - Pain control: scheduled tylenol , prn fentanyl . - Hypertension: home metoprolol  and hydrochlorothiazide. On clonidine  patch for sedation and hypertension. - EJ leak: S/p esophageal stent placement. Pleural JP to remain in place. On antibiotics and antifungal coverage per thoracic, will need 6 weeks total. - FEN: Continue J tube feeds at goal. Patient is tolerating and having bowel function. - ID: On broad-spectrum abx and antifungal coverage for treatment of aspiration pneumonia and EJ leak. To receive a 6-week course of abx per thoracic. WBC trending up, but patient clinically appears well and is on broad abx with drain in place. Continue to trend WBC, perform CT chest if this continues to rise. - A-fib: remains in sinus rhythm. On therapeutic lovenox . - Code status: back to full code per patient wishes - Dispo: to [facility], PT recommending SNF at discharge, patient wishes to go home. He currently needs a lot of help to get out of bed. Will see how he progresses with mobility. Can also consider CIR if he makes significant improvement in activity tolerance.   LOS: 29 days    Dann Hummer, MD, MPH, FACS Trauma & General Surgery Use [email or web address] to contact on call provider  02/13/2024

## 2024-02-13 NOTE — Progress Notes (Signed)
 Pharmacy Electrolyte Replacement  Recent Labs:  Recent Labs    02/13/24 0548  K 3.4*  CREATININE 0.76    Low Critical Values (K </= 2.5, Phos </= 1, Mg </= 1) Present: None  Plan: KCL 40 mEq per tube once.   Danyelle Brookover, PharmD

## 2024-02-13 NOTE — Plan of Care (Signed)
  Problem: Education: Goal: Ability to describe self-care measures that may prevent or decrease complications (Diabetes Survival Skills Education) will improve Outcome: Progressing Goal: Individualized Educational Video(s) Outcome: Progressing   Problem: Coping: Goal: Ability to adjust to condition or change in health will improve Outcome: Progressing   Problem: Fluid Volume: Goal: Ability to maintain a balanced intake and output will improve Outcome: Progressing   Problem: Health Behavior/Discharge Planning: Goal: Ability to identify and utilize available resources and services will improve Outcome: Progressing Goal: Ability to manage health-related needs will improve Outcome: Progressing   Problem: Metabolic: Goal: Ability to maintain appropriate glucose levels will improve Outcome: Progressing   Problem: Nutritional: Goal: Maintenance of adequate nutrition will improve Outcome: Progressing Goal: Progress toward achieving an optimal weight will improve Outcome: Progressing   Problem: Skin Integrity: Goal: Risk for impaired skin integrity will decrease Outcome: Progressing   Problem: Skin Integrity: Goal: Risk for impaired skin integrity will decrease Outcome: Progressing   Problem: Tissue Perfusion: Goal: Adequacy of tissue perfusion will improve Outcome: Progressing   Problem: Education: Goal: Knowledge of General Education information will improve Description: Including pain rating scale, medication(s)/side effects and non-pharmacologic comfort measures Outcome: Progressing   Problem: Clinical Measurements: Goal: Will remain free from infection Outcome: Progressing Goal: Respiratory complications will improve Outcome: Progressing   Problem: Pain Managment: Goal: General experience of comfort will improve and/or be controlled Outcome: Progressing   Problem: Activity: Goal: Risk for activity intolerance will decrease Outcome: Progressing   Problem:  Safety: Goal: Ability to remain free from injury will improve Outcome: Progressing   Problem: Safety: Goal: Non-violent Restraint(s) Outcome: Progressing   Problem: Activity: Goal: Ability to tolerate increased activity will improve Outcome: Progressing   Problem: Respiratory: Goal: Ability to maintain a clear airway and adequate ventilation will improve Outcome: Progressing   Problem: Role Relationship: Goal: Method of communication will improve Outcome: Progressing

## 2024-02-13 NOTE — TOC Progression Note (Signed)
 Transition of Care [facility]) - Progression Note    Patient Details  Name: [name] MRN: [id number] Date of Birth: 28-Oct-1955  Transition of Care [facility]) CM/SW Contact  Inocente GORMAN Kindle, LCSW Phone Number: 02/13/2024, 8:44 AM  Clinical Narrative:    CSW continuing to follow. Therapy on schedule to see patient today.    Expected Discharge Plan: Skilled Nursing Facility Barriers to Discharge: Continued Medical Work up, English as a second language teacher, SNF Pending bed offer               Expected Discharge Plan and Services     Post Acute Care Choice: Skilled Nursing Facility Living arrangements for the past 2 months: Single Family Home                                       Social Drivers of Health (SDOH) Interventions SDOH Screenings   Food Insecurity: No Food Insecurity (01/28/2024)  Housing: Low Risk  (01/28/2024)  Transportation Needs: No Transportation Needs (01/28/2024)  Utilities: Not At Risk (01/28/2024)  Depression (PHQ2-9): Low Risk  (11/25/2023)  Financial Resource Strain: Low Risk  (08/20/2023)  Social Connections: Unknown (01/28/2024)  Stress: No Stress Concern Present (08/20/2023)  Tobacco Use: High Risk (01/28/2024)    Readmission Risk Interventions    11/05/2023   12:23 PM  Readmission Risk Prevention Plan  Transportation Screening Complete  PCP or Specialist Appt within 3-5 Days Complete  HRI or Home Care Consult Complete  Social Work Consult for Recovery Care Planning/Counseling Complete  Palliative Care Screening Complete  Medication Review Oceanographer) Complete
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
## 2024-02-13 NOTE — Progress Notes (Signed)
 Oxycodone  5mg  solution given after PT/OT session. Pt reported 7/10 pain on his back.   Pt back in bed resting

## 2024-02-14 LAB — CBC
HCT: 33 % — ABNORMAL LOW (ref 39.0–52.0)
Hemoglobin: 10.2 g/dL — ABNORMAL LOW (ref 13.0–17.0)
MCH: 29.7 pg (ref 26.0–34.0)
MCHC: 30.9 g/dL (ref 30.0–36.0)
MCV: 95.9 fL (ref 80.0–100.0)
Platelets: 551 K/uL — ABNORMAL HIGH (ref 150–400)
RBC: 3.44 MIL/uL — ABNORMAL LOW (ref 4.22–5.81)
RDW: 16.2 % — ABNORMAL HIGH (ref 11.5–15.5)
WBC: 24.5 K/uL — ABNORMAL HIGH (ref 4.0–10.5)
nRBC: 0 % (ref 0.0–0.2)

## 2024-02-14 LAB — GLUCOSE, CAPILLARY
Glucose-Capillary: 121 mg/dL — ABNORMAL HIGH (ref 70–99)
Glucose-Capillary: 134 mg/dL — ABNORMAL HIGH (ref 70–99)
Glucose-Capillary: 140 mg/dL — ABNORMAL HIGH (ref 70–99)
Glucose-Capillary: 143 mg/dL — ABNORMAL HIGH (ref 70–99)
Glucose-Capillary: 144 mg/dL — ABNORMAL HIGH (ref 70–99)
Glucose-Capillary: 174 mg/dL — ABNORMAL HIGH (ref 70–99)

## 2024-02-14 LAB — BASIC METABOLIC PANEL WITH GFR
Anion gap: 13 (ref 5–15)
BUN: 30 mg/dL — ABNORMAL HIGH (ref 8–23)
CO2: 26 mmol/L (ref 22–32)
Calcium: 9.2 mg/dL (ref 8.9–10.3)
Chloride: 107 mmol/L (ref 98–111)
Creatinine, Ser: 0.72 mg/dL (ref 0.61–1.24)
GFR, Estimated: >60 mL/min (ref >60)
Glucose, Bld: 161 mg/dL — ABNORMAL HIGH (ref 70–99)
Potassium: 4.7 mmol/L (ref 3.5–5.1)
Sodium: 146 mmol/L — ABNORMAL HIGH (ref 135–145)

## 2024-02-14 MED ORDER — FREE WATER
200.0000 mL | Freq: Three times a day (TID) | Status: DC
  Administered 2024-02-14 – 2024-02-19 (×16): 200 mL

## 2024-02-14 MED ORDER — QUETIAPINE FUMARATE 25 MG PO TABS: 50.0000 mg | ORAL_TABLET | Freq: Every day | ORAL | Status: DC

## 2024-02-14 MED ORDER — CLONAZEPAM 0.1 MG/ML ORAL SUSPENSION
0.5000 mg | Freq: Every day | ORAL | Status: DC
  Administered 2024-02-14 – 2024-02-20 (×7): 0.5 mg
  Filled 2024-02-14 (×7): qty 5

## 2024-02-14 MED ORDER — ORAL CARE MOUTH RINSE: 15.0000 mL | OROMUCOSAL | Status: DC | PRN

## 2024-02-14 MED ORDER — ENOXAPARIN SODIUM 80 MG/0.8ML IJ SOSY
65.0000 mg | PREFILLED_SYRINGE | Freq: Two times a day (BID) | INTRAMUSCULAR | Status: DC
  Filled 2024-02-14: qty 0.65

## 2024-02-14 MED ORDER — ORAL CARE MOUTH RINSE
15.0000 mL | OROMUCOSAL | Status: DC
  Administered 2024-02-14 – 2024-03-14 (×106): 15 mL via OROMUCOSAL

## 2024-02-14 MED ORDER — INSULIN ASPART 100 UNIT/ML IJ SOLN
0.0000 [IU] | INTRAMUSCULAR | Status: DC
  Administered 2024-02-14 – 2024-02-15 (×3): 1 [IU] via SUBCUTANEOUS
  Administered 2024-02-15: 2 [IU] via SUBCUTANEOUS
  Administered 2024-02-15: 1 [IU] via SUBCUTANEOUS
  Administered 2024-02-15 (×3): 2 [IU] via SUBCUTANEOUS
  Administered 2024-02-16: 1 [IU] via SUBCUTANEOUS
  Administered 2024-02-16: 2 [IU] via SUBCUTANEOUS

## 2024-02-14 MED ORDER — ENOXAPARIN SODIUM 80 MG/0.8ML IJ SOSY
70.0000 mg | PREFILLED_SYRINGE | Freq: Two times a day (BID) | INTRAMUSCULAR | Status: DC
  Administered 2024-02-14 – 2024-03-14 (×59): 70 mg via SUBCUTANEOUS
  Filled 2024-02-14 (×62): qty 0.7

## 2024-02-14 NOTE — Progress Notes (Signed)
 Report given to Arley Maidens, RN on [facility]. All questions answered. Pt transported to 4N[facility]. Pt's sister made aware of transfer.

## 2024-02-14 NOTE — Progress Notes (Addendum)
 Patient ID: [name], male   DOB: 03/26/1956, 68 y.o.   MRN: [id number] Follow up - Trauma Critical Care   Patient Details:    [name] is an 68 y.o. male.  Lines/tubes : Implanted Port 08/29/23 Right Chest Single Power (Active)  Site Assessment Clean, Dry, Intact 02/13/24 2000  Port Status Accessed 02/13/24 2000  Needle Size (Only chart with needle placement) PH 19 gauge 02/08/24 1455  Line Status Infusing 02/13/24 2000  Line Care Connections checked and tightened 02/13/24 2000  Dressing Type Transparent 02/13/24 2000  Dressing Status Antimicrobial disc/dressing in place;Clean, Dry, Intact 02/13/24 2000  Dressing Intervention Dressing changed;Antimicrobial disc changed 02/08/24 1455  Needle Change Due 02/15/24 02/13/24 2000     Closed System Drain 2 Right Chest Bulb (JP) 19 Fr. (Active)  Site Description Unremarkable 02/13/24 2000  Dressing Status Clean, Dry, Intact 02/13/24 2000  Drainage Appearance Thick;Milky;Tan 02/13/24 2000  Status To gravity (Uncharged) 02/13/24 2000  Intake (mL) 0 ml 02/06/24 0700  Output (mL) 10 mL 02/14/24 0530     Gastrostomy/Enterostomy Jejunostomy 14 Fr. LUQ (Active)  Surrounding Skin Dry;Intact 02/13/24 2000  Tube Status/Interventions Feeding 02/13/24 2000  Drainage Appearance None 02/13/24 2000  Catheter Position (cm marking) 5 cm 01/20/24 1930  Dressing Status Clean, Dry, Intact 02/13/24 2000  Dressing Intervention Assessed, no intervention needed 02/13/24 2000  Dressing Type Split gauze 02/13/24 2000  G Port Intake (mL) 50 ml 02/13/24 2000  J Port Intake (mL) 0 ml 02/06/24 0700  Output (mL) 0 mL 02/06/24 0700     External Urinary Catheter (Active)  Dedicated Suction Verified suction is between 40-80 mmHg 02/14/24 0000  Securement Method None needed 02/14/24 0000  Site Assessment Clean, Dry, Intact 02/14/24 0000  Intervention No interventions needed at this time 02/13/24 2225  Intervention External Catheter Replaced 02/14/24 0000   Output (mL) 315 mL 02/14/24 0530     Fecal Management System 40 mL (Active)  Does patient meet criteria for removal? No 02/13/24 2000  Daily care Flushed tube with 30mL water  (document as intake) 02/14/24 0700  Patient Indicator Assessment Green 02/13/24 0800  Bulb Deflated and Reinflated Yes 02/12/24 2000  Amount in bulb 40 mL 02/12/24 2000  Output (mL) 75 mL 02/14/24 0530  Intake (mL) 20 mL 02/10/24 1730    Microbiology/Sepsis markers: Results for orders placed or performed during the hospital encounter of 01/15/24  Culture, blood (Routine X 2) w Reflex to ID Panel     Status: None   Collection Time: 01/23/24  5:39 AM   Specimen: BLOOD RIGHT HAND  Result Value Ref Range Status   Specimen Description BLOOD RIGHT HAND  Final   Special Requests   Final    BOTTLES DRAWN AEROBIC AND ANAEROBIC Blood Culture results may not be optimal due to an inadequate volume of blood received in culture bottles   Culture   Final    NO GROWTH 5 DAYS Performed at [facility], [address], [place], [place] [address]    Report Status 01/28/2024 FINAL  Final  Culture, blood (Routine X 2) w Reflex to ID Panel     Status: None   Collection Time: 01/23/24  5:41 AM   Specimen: BLOOD LEFT HAND  Result Value Ref Range Status   Specimen Description BLOOD LEFT HAND  Final   Special Requests   Final    BOTTLES DRAWN AEROBIC AND ANAEROBIC Blood Culture results may not be optimal due to an inadequate volume of blood received in  culture bottles   Culture   Final    NO GROWTH 5 DAYS Performed at [facility], [address], [place], [place] [address]    Report Status 01/28/2024 FINAL  Final  Culture, Respiratory w Gram Stain     Status: None   Collection Time: 02/02/24  2:42 AM   Specimen: Tracheal Aspirate; Respiratory  Result Value Ref Range Status   Specimen Description TRACHEAL ASPIRATE  Final   Special Requests NONE  Final   Gram Stain   Final    RARE WBC PRESENT, PREDOMINANTLY  PMN FEW GRAM NEGATIVE RODS Performed at [facility], [address], [place], [place] [address]    Culture FEW PSEUDOMONAS AERUGINOSA  Final   Report Status 02/04/2024 FINAL  Final   Organism ID, Bacteria PSEUDOMONAS AERUGINOSA  Final      Susceptibility   Pseudomonas aeruginosa - MIC*    MEROPENEM  1 SENSITIVE Sensitive     CIPROFLOXACIN 0.12 SENSITIVE Sensitive     IMIPENEM 2 SENSITIVE Sensitive     CEFTAZIDIME/AVIBACTAM 8 SENSITIVE Sensitive     CEFTOLOZANE/TAZOBACTAM 4 SENSITIVE Sensitive     TOBRAMYCIN <=1 SENSITIVE Sensitive     CEFTAZIDIME >=32 RESISTANT Resistant     * FEW PSEUDOMONAS AERUGINOSA  Culture, blood (Routine X 2) w Reflex to ID Panel     Status: None   Collection Time: 02/02/24  4:52 AM   Specimen: BLOOD LEFT HAND  Result Value Ref Range Status   Specimen Description BLOOD LEFT HAND  Final   Special Requests   Final    BOTTLES DRAWN AEROBIC ONLY Blood Culture results may not be optimal due to an inadequate volume of blood received in culture bottles   Culture   Final    NO GROWTH 5 DAYS Performed at [facility], [address], [place], [place] [address]    Report Status 02/07/2024 FINAL  Final  Culture, blood (Routine X 2) w Reflex to ID Panel     Status: None   Collection Time: 02/02/24  5:00 AM   Specimen: BLOOD RIGHT HAND  Result Value Ref Range Status   Specimen Description BLOOD RIGHT HAND  Final   Special Requests   Final    BOTTLES DRAWN AEROBIC ONLY Blood Culture results may not be optimal due to an inadequate volume of blood received in culture bottles   Culture   Final    NO GROWTH 5 DAYS Performed at [facility], [address], [place], [place] [address]    Report Status 02/07/2024 FINAL  Final  Body fluid culture w Gram Stain     Status: None   Collection Time: 02/02/24  5:42 AM   Specimen: Body Fluid  Result Value Ref Range Status   Specimen Description FLUID  Final   Special Requests Pleural R  Final   Gram Stain    Final    MODERATE WBC PRESENT,BOTH PMN AND MONONUCLEAR RARE GRAM NEGATIVE RODS Performed at [facility], [address] [place], [place] [address]    Culture   Final    ABUNDANT PSEUDOMONAS AERUGINOSA ABUNDANT CITROBACTER KOSERI    Report Status 02/05/2024 FINAL  Final   Organism ID, Bacteria CITROBACTER KOSERI  Final   Organism ID, Bacteria PSEUDOMONAS AERUGINOSA  Final      Susceptibility   Citrobacter koseri - MIC*    CEFEPIME  <=0.12 SENSITIVE Sensitive     ERTAPENEM <=0.12 SENSITIVE Sensitive     CEFTRIAXONE <=  0.25 SENSITIVE Sensitive     CIPROFLOXACIN <=0.06 SENSITIVE Sensitive     GENTAMICIN <=1 SENSITIVE Sensitive     MEROPENEM  <=0.25 SENSITIVE Sensitive     TRIMETH/SULFA <=20 SENSITIVE Sensitive     PIP/TAZO Value in next row Sensitive      16 SENSITIVEThis is a modified FDA-approved test that has been validated and its performance characteristics determined by the reporting laboratory.  This laboratory is certified under the Clinical Laboratory Improvement Amendments CLIA as qualified to perform high complexity clinical laboratory testing.    * ABUNDANT CITROBACTER KOSERI   Pseudomonas aeruginosa - MIC*    MEROPENEM  Value in next row Sensitive      16 SENSITIVEThis is a modified FDA-approved test that has been validated and its performance characteristics determined by the reporting laboratory.  This laboratory is certified under the Clinical Laboratory Improvement Amendments CLIA as qualified to perform high complexity clinical laboratory testing.    CIPROFLOXACIN Value in next row Sensitive      16 SENSITIVEThis is a modified FDA-approved test that has been validated and its performance characteristics determined by the reporting laboratory.  This laboratory is certified under the Clinical Laboratory Improvement Amendments CLIA as qualified to perform high complexity clinical laboratory testing.    CEFEPIME  Value in next row Resistant      16 SENSITIVEThis is a  modified FDA-approved test that has been validated and its performance characteristics determined by the reporting laboratory.  This laboratory is certified under the Clinical Laboratory Improvement Amendments CLIA as qualified to perform high complexity clinical laboratory testing.    CEFTAZIDIME Value in next row Resistant      16 SENSITIVEThis is a modified FDA-approved test that has been validated and its performance characteristics determined by the reporting laboratory.  This laboratory is certified under the Clinical Laboratory Improvement Amendments CLIA as qualified to perform high complexity clinical laboratory testing.    IMIPENEM Value in next row Sensitive      16 SENSITIVEThis is a modified FDA-approved test that has been validated and its performance characteristics determined by the reporting laboratory.  This laboratory is certified under the Clinical Laboratory Improvement Amendments CLIA as qualified to perform high complexity clinical laboratory testing.    CEFTAZIDIME/AVIBACTAM Value in next row Sensitive      16 SENSITIVEThis is a modified FDA-approved test that has been validated and its performance characteristics determined by the reporting laboratory.  This laboratory is certified under the Clinical Laboratory Improvement Amendments CLIA as qualified to perform high complexity clinical laboratory testing.    CEFTOLOZANE/TAZOBACTAM Value in next row Sensitive      16 SENSITIVEThis is a modified FDA-approved test that has been validated and its performance characteristics determined by the reporting laboratory.  This laboratory is certified under the Clinical Laboratory Improvement Amendments CLIA as qualified to perform high complexity clinical laboratory testing.    TOBRAMYCIN Value in next row Sensitive      16 SENSITIVEThis is a modified FDA-approved test that has been validated and its performance characteristics determined by the reporting laboratory.  This laboratory is  certified under the Clinical Laboratory Improvement Amendments CLIA as qualified to perform high complexity clinical laboratory testing.    * ABUNDANT PSEUDOMONAS AERUGINOSA  Culture, blood (Routine X 2) w Reflex to ID Panel     Status: None   Collection Time: 02/07/24  8:43 AM   Specimen: BLOOD  Result Value Ref Range Status   Specimen Description BLOOD BLOOD LEFT HAND  Final  Special Requests   Final    BOTTLES DRAWN AEROBIC AND ANAEROBIC Blood Culture adequate volume   Culture   Final    NO GROWTH 5 DAYS Performed at [facility], [address] [place], [place] [address]    Report Status 02/12/2024 FINAL  Final  Culture, blood (Routine X 2) w Reflex to ID Panel     Status: None   Collection Time: 02/07/24  8:51 AM   Specimen: BLOOD  Result Value Ref Range Status   Specimen Description BLOOD BLOOD LEFT ARM  Final   Special Requests   Final    BOTTLES DRAWN AEROBIC AND ANAEROBIC Blood Culture results may not be optimal due to an inadequate volume of blood received in culture bottles   Culture   Final    NO GROWTH 5 DAYS Performed at [facility], [address], [place], [place] [address]    Report Status 02/12/2024 FINAL  Final  MRSA Next Gen by PCR, Nasal     Status: None   Collection Time: 02/07/24  9:01 AM   Specimen: Nasal Mucosa; Nasal Swab  Result Value Ref Range Status   MRSA by PCR Next Gen NOT DETECTED NOT DETECTED Final    Comment: (NOTE) The GeneXpert MRSA Assay (FDA approved for NASAL specimens only), is one component of a comprehensive MRSA colonization surveillance program. It is not intended to diagnose MRSA infection nor to guide or monitor treatment for MRSA infections. Test performance is not FDA approved in patients less than 24 years old. Performed at [facility], [address], [place], [place] [address]     Anti-infectives:  Anti-infectives (From admission, onward)    Start     Dose/Rate Route Frequency Ordered Stop    02/07/24 2000  vancomycin  (VANCOCIN ) IVPB 1000 mg/200 mL premix  Status:  Discontinued        1,000 mg 200 mL/hr over 60 Minutes Intravenous Every 12 hours 02/07/24 1229 02/08/24 0725   02/07/24 0915  vancomycin  (VANCOREADY) IVPB 1500 mg/300 mL        1,500 mg 150 mL/hr over 120 Minutes Intravenous  Once 02/07/24 0823 02/07/24 1125   02/02/24 0400  micafungin  (MYCAMINE ) 100 mg in sodium chloride  0.9 % 100 mL IVPB        100 mg 105 mL/hr over 1 Hours Intravenous Daily 02/02/24 0248 03/15/24 1400   02/02/24 0400  meropenem  (MERREM ) 1,000 mg in sodium chloride  0.9 % 100 mL IVPB        1,000 mg 200 mL/hr over 30 Minutes Intravenous Every 8 hours 02/02/24 0248 03/15/24 1400   02/02/24 0315  vancomycin  (VANCOCIN ) IVPB 1000 mg/200 mL premix        1,000 mg 200 mL/hr over 60 Minutes Intravenous  Once 02/02/24 0228 02/02/24 0510   01/31/24 1800  piperacillin -tazobactam (ZOSYN ) IVPB 3.375 g  Status:  Discontinued        3.375 g 12.5 mL/hr over 240 Minutes Intravenous Every 8 hours 01/31/24 1639 02/02/24 0248   01/31/24 1745  fluconazole  (DIFLUCAN ) IVPB 400 mg  Status:  Discontinued        400 mg 100 mL/hr over 120 Minutes Intravenous Every 24 hours 01/31/24 1659 02/02/24 0248   01/22/24 1000  fluconazole  (DIFLUCAN ) IVPB 400 mg       Placed in Followed by Linked Group   400 mg 100 mL/hr over 120 Minutes Intravenous Every 24 hours 01/21/24 0956 01/30/24 1134   01/21/24 1045  fluconazole  (DIFLUCAN )  IVPB 800 mg       Placed in Followed by Linked Group   800 mg 200 mL/hr over 120 Minutes Intravenous  Once 01/21/24 0956 01/21/24 1441   01/21/24 0945  piperacillin -tazobactam (ZOSYN ) IVPB 3.375 g        3.375 g 12.5 mL/hr over 240 Minutes Intravenous Every 8 hours 01/21/24 0920 01/30/24 2216   01/16/24 1510  ceFAZolin  (ANCEF ) 2-4 GM/100ML-% IVPB       Note to Pharmacy: Cindie Pizza: cabinet override      01/16/24 1510 01/17/24 0314   01/15/24 0645  ceFAZolin  (ANCEF ) IVPB 2g/100 mL premix        Placed in And Linked Group   2 g 200 mL/hr over 30 Minutes Intravenous On call to O.R. 01/15/24 0641 01/15/24 1700   01/15/24 0645  metroNIDAZOLE  (FLAGYL ) IVPB 500 mg       Placed in And Linked Group   500 mg 100 mL/hr over 60 Minutes Intravenous On call to O.R. 01/15/24 9358 01/15/24 0910   01/15/24 0642  ceFAZolin  (ANCEF ) IVPB 2g/100 mL premix  Status:  Discontinued        2 g 200 mL/hr over 30 Minutes Intravenous 30 min pre-op 01/15/24 9357 01/15/24 0644     Consults: Treatment Team:  Shyrl Linnie KIDD, MD Aron Shoulders, MD    Studies:    Events:  Subjective:    Overnight Issues: frustrated about events surrounding BM overnight, reports feeling better. Staff report he is not sleeping well and has been verbally abusive.  Objective:  Vital signs for last 24 hours: Temp:  [98.3 F (36.8 C)-99.4 F (37.4 C)] 98.7 F (37.1 C) (10/03 0400) Pulse Rate:  [86-109] 97 (10/03 0700) Resp:  [14-28] 19 (10/03 0700) BP: (106-169)/(78-98) 131/88 (10/03 0700) SpO2:  [95 %-100 %] 99 % (10/03 0736) Weight:  [66.9 kg] 66.9 kg (10/03 0500)  Hemodynamic parameters for last 24 hours:    Intake/Output from previous day: 10/02 0701 - 10/03 0700 In: 3070 [WH/HU:7854; IV Piggyback:605] Out: 1820 [Urine:1715; Drains:30; Stool:75]  Intake/Output this shift: No intake/output data recorded.  Vent settings for last 24 hours:    Physical Exam:  General: alert and no respiratory distress Neuro: alert and oriented Resp: clear to auscultation bilaterally CVS: RRR GI: soft, wound CDI, J tube, NT Extremities: calves soft  Results for orders placed or performed during the hospital encounter of 01/15/24 (from the past 24 hours)  Glucose, capillary     Status: Abnormal   Collection Time: 02/13/24  7:54 AM  Result Value Ref Range   Glucose-Capillary 134 (H) 70 - 99 mg/dL  Glucose, capillary     Status: Abnormal   Collection Time: 02/13/24 11:54 AM  Result Value Ref Range    Glucose-Capillary 143 (H) 70 - 99 mg/dL  Glucose, capillary     Status: Abnormal   Collection Time: 02/13/24  3:55 PM  Result Value Ref Range   Glucose-Capillary 159 (H) 70 - 99 mg/dL  Glucose, capillary     Status: Abnormal   Collection Time: 02/13/24  7:18 PM  Result Value Ref Range   Glucose-Capillary 144 (H) 70 - 99 mg/dL  Glucose, capillary     Status: Abnormal   Collection Time: 02/13/24 11:24 PM  Result Value Ref Range   Glucose-Capillary 144 (H) 70 - 99 mg/dL  Glucose, capillary     Status: Abnormal   Collection Time: 02/14/24  3:29 AM  Result Value Ref Range   Glucose-Capillary 143 (H) 70 -  99 mg/dL  CBC     Status: Abnormal   Collection Time: 02/14/24  5:48 AM  Result Value Ref Range   WBC 24.5 (H) 4.0 - 10.5 K/uL   RBC 3.44 (L) 4.22 - 5.81 MIL/uL   Hemoglobin 10.2 (L) 13.0 - 17.0 g/dL   HCT 66.9 (L) 60.9 - 47.9 %   MCV 95.9 80.0 - 100.0 fL   MCH 29.7 26.0 - 34.0 pg   MCHC 30.9 30.0 - 36.0 g/dL   RDW 83.7 (H) 88.4 - 84.4 %   Platelets 551 (H) 150 - 400 K/uL   nRBC 0.0 0.0 - 0.2 %  Basic metabolic panel with GFR     Status: Abnormal   Collection Time: 02/14/24  5:48 AM  Result Value Ref Range   Sodium 146 (H) 135 - 145 mmol/L   Potassium 4.7 3.5 - 5.1 mmol/L   Chloride 107 98 - 111 mmol/L   CO2 26 22 - 32 mmol/L   Glucose, Bld 161 (H) 70 - 99 mg/dL   BUN 30 (H) 8 - 23 mg/dL   Creatinine, Ser 9.27 0.61 - 1.24 mg/dL   Calcium  9.2 8.9 - 10.3 mg/dL   GFR, Estimated >39 >39 mL/min   Anion gap 13 5 - 15    Assessment & Plan: Present on Admission:  Adenocarcinoma of gastric cardia (HCC)    LOS: 30 days   Additional comments:I reviewed the patient's new clinical lab test results. / Mr. Zani is a 68 yo male with gastric cardia adenocarcinoma, s/p total gastrectomy, with distal esophagectomy and roux-en-Y esophagojejunostomy on 9/3. S/p takeback by thoracic surgery for RATS repair of anastomotic leak on 9/4. S/p EGD with placement of esophageal stent on  9/11. S/p EGD with repositioning of esophageal stent 9/25. - Has had respiratory arrest x2 associated with hypercarbia. Extubated 9/27, weaning oxygen. - ICU delirium: this has improved a lot, Clonidine  patch - Pain control: scheduled tylenol , prn fentanyl . - Hypertension: home metoprolol  and hydrochlorothiazide. On clonidine  patch for sedation and hypertension. - EJ leak: S/p esophageal stent placement. Pleural JP to remain in place. On antibiotics and antifungal coverage per thoracic, will need 6 weeks total. - FEN: Continue J tube feeds at goal. Patient is tolerating and having bowel function. Add FWF for hypernatremia. - ID: On broad-spectrum abx and antifungal coverage (Merrem  and Micafungin ) for treatment of aspiration pneumonia and EJ leak. To receive a 6-week course of abx per thoracic. WBC trending down and afeb - A-fib: remains in sinus rhythm. On therapeutic lovenox . - Code status: back to full code per patient wishes - Dispo: to 4NP, try Klonopin at HS, appreciate CCM F/U, PT recommending SNF at discharge, patient wishes to go home. Can also consider CIR if he makes significant improvement in activity tolerance.   Dann Hummer, MD, MPH, FACS Trauma & General Surgery Use [email or web address] to contact on call provider  02/14/2024  *Care during the described time interval was provided by me. I have reviewed this patient's available data, including medical history, events of note, physical examination and test results as part of my evaluation.

## 2024-02-15 LAB — GLUCOSE, CAPILLARY
Glucose-Capillary: 157 mg/dL — ABNORMAL HIGH (ref 70–99)
Glucose-Capillary: 186 mg/dL — ABNORMAL HIGH (ref 70–99)
Glucose-Capillary: 131 mg/dL — ABNORMAL HIGH (ref 70–99)
Glucose-Capillary: 161 mg/dL — ABNORMAL HIGH (ref 70–99)
Glucose-Capillary: 174 mg/dL — ABNORMAL HIGH (ref 70–99)
Glucose-Capillary: 109 mg/dL — ABNORMAL HIGH (ref 70–99)

## 2024-02-15 LAB — BASIC METABOLIC PANEL WITH GFR
Anion gap: 11 (ref 5–15)
BUN: 28 mg/dL — ABNORMAL HIGH (ref 8–23)
CO2: 26 mmol/L (ref 22–32)
Calcium: 8.7 mg/dL — ABNORMAL LOW (ref 8.9–10.3)
Chloride: 105 mmol/L (ref 98–111)
Creatinine, Ser: 0.7 mg/dL (ref 0.61–1.24)
GFR, Estimated: >60 mL/min (ref >60)
Glucose, Bld: 197 mg/dL — ABNORMAL HIGH (ref 70–99)
Potassium: 3.6 mmol/L (ref 3.5–5.1)
Sodium: 142 mmol/L (ref 135–145)

## 2024-02-15 NOTE — Progress Notes (Signed)
 9 Days Post-Op  Subjective: CC: Patient reports he is tired of his abdomen but doesn't report pain. He reports some nausea. No vomiting. On TF's. BM via rectal tube. Voiding with good UOP on I/O.   Afebrile. HR 108. Has been tachycardic on review since yesterday morning. Due for scheduled Metoprolol  soon. Has been No hypotension. On RA. Getting nebulizer treatment. Hypernatremia improved. Cr wnl.   Objective: Vital signs in last 24 hours: Temp:  [97.8 F (36.6 C)-98.6 F (37 C)] 98.6 F (37 C) (10/04 0339) Pulse Rate:  [87-108] 108 (10/04 0339) Resp:  [19-27] 21 (10/04 0339) BP: (118-168)/(4-102) 118/64 (10/04 0339) SpO2:  [97 %-100 %] 97 % (10/04 0339) Weight:  [66.9 kg] 66.9 kg (10/04 0457) Last BM Date : 02/14/24  Intake/Output from previous day: 10/03 0701 - 10/04 0700 In: 2168.1 [I.V.:10; WH/HU:8010.4; IV Piggyback:108.6] Out: 1475 [Urine:1350; Stool:125] Intake/Output this shift: No intake/output data recorded.  PE: Gen:  Alert, NAD, pleasant Card:  Tachycardic. Sinus tachycardia on tele.  Pulm:  Rate and effort normal Abd: Soft, no distension, NT. J-tube with TF's running. Midline wound cdi. JP milky.  Ext:  No LE edema   Lab Results:  Recent Labs    02/13/24 0548 02/14/24 0548  WBC 30.8* 24.5*  HGB 9.9* 10.2*  HCT 31.5* 33.0*  PLT 594* 551*   BMET Recent Labs    02/14/24 0548 02/15/24 0604  NA 146* 142  K 4.7 3.6  CL 107 105  CO2 26 26  GLUCOSE 161* 197*  BUN 30* 28*  CREATININE 0.72 0.70  CALCIUM  9.2 8.7*   PT/INR No results for input(s): LABPROT, INR in the last 72 hours. CMP     Component Value Date/Time   NA 142 02/15/2024 0604   K 3.6 02/15/2024 0604   CL 105 02/15/2024 0604   CO2 26 02/15/2024 0604   GLUCOSE 197 (H) 02/15/2024 0604   BUN 28 (H) 02/15/2024 0604   CREATININE 0.70 02/15/2024 0604   CREATININE 0.85 10/10/2023 0824   CALCIUM  8.7 (L) 02/15/2024 0604   PROT 5.1 (L) 02/07/2024 0155   ALBUMIN  <1.5 (L) 02/07/2024  0155   AST 31 02/07/2024 0155   AST 15 10/10/2023 0824   ALT 37 02/07/2024 0155   ALT 11 10/10/2023 0824   ALKPHOS 44 02/07/2024 0155   BILITOT 0.9 02/07/2024 0155   BILITOT 0.5 10/10/2023 0824   GFRNONAA >60 02/15/2024 0604   GFRNONAA >60 10/10/2023 0824   GFRAA  10/16/2007 1246    >60        The eGFR has been calculated using the MDRD equation. This calculation has not been validated in all clinical   Lipase  No results found for: LIPASE  Studies/Results: No results found.  Anti-infectives: Anti-infectives (From admission, onward)    Start     Dose/Rate Route Frequency Ordered Stop   02/07/24 2000  vancomycin  (VANCOCIN ) IVPB 1000 mg/200 mL premix  Status:  Discontinued        1,000 mg 200 mL/hr over 60 Minutes Intravenous Every 12 hours 02/07/24 1229 02/08/24 0725   02/07/24 0915  vancomycin  (VANCOREADY) IVPB 1500 mg/300 mL        1,500 mg 150 mL/hr over 120 Minutes Intravenous  Once 02/07/24 0823 02/07/24 1125   02/02/24 0400  micafungin  (MYCAMINE ) 100 mg in sodium chloride  0.9 % 100 mL IVPB        100 mg 105 mL/hr over 1 Hours Intravenous Daily 02/02/24 0248 03/15/24 1400  02/02/24 0400  meropenem  (MERREM ) 1,000 mg in sodium chloride  0.9 % 100 mL IVPB        1,000 mg 200 mL/hr over 30 Minutes Intravenous Every 8 hours 02/02/24 0248 03/15/24 1400   02/02/24 0315  vancomycin  (VANCOCIN ) IVPB 1000 mg/200 mL premix        1,000 mg 200 mL/hr over 60 Minutes Intravenous  Once 02/02/24 0228 02/02/24 0510   01/31/24 1800  piperacillin -tazobactam (ZOSYN ) IVPB 3.375 g  Status:  Discontinued        3.375 g 12.5 mL/hr over 240 Minutes Intravenous Every 8 hours 01/31/24 1639 02/02/24 0248   01/31/24 1745  fluconazole  (DIFLUCAN ) IVPB 400 mg  Status:  Discontinued        400 mg 100 mL/hr over 120 Minutes Intravenous Every 24 hours 01/31/24 1659 02/02/24 0248   01/22/24 1000  fluconazole  (DIFLUCAN ) IVPB 400 mg       Placed in Followed by Linked Group   400 mg 100 mL/hr over  120 Minutes Intravenous Every 24 hours 01/21/24 0956 01/30/24 1134   01/21/24 1045  fluconazole  (DIFLUCAN ) IVPB 800 mg       Placed in Followed by Linked Group   800 mg 200 mL/hr over 120 Minutes Intravenous  Once 01/21/24 0956 01/21/24 1441   01/21/24 0945  piperacillin -tazobactam (ZOSYN ) IVPB 3.375 g        3.375 g 12.5 mL/hr over 240 Minutes Intravenous Every 8 hours 01/21/24 0920 01/30/24 2216   01/16/24 1510  ceFAZolin  (ANCEF ) 2-4 GM/100ML-% IVPB       Note to Pharmacy: Cindie Pizza: cabinet override      01/16/24 1510 01/17/24 0314   01/15/24 0645  ceFAZolin  (ANCEF ) IVPB 2g/100 mL premix       Placed in And Linked Group   2 g 200 mL/hr over 30 Minutes Intravenous On call to O.R. 01/15/24 0641 01/15/24 1700   01/15/24 0645  metroNIDAZOLE  (FLAGYL ) IVPB 500 mg       Placed in And Linked Group   500 mg 100 mL/hr over 60 Minutes Intravenous On call to O.R. 01/15/24 9358 01/15/24 0910   01/15/24 0642  ceFAZolin  (ANCEF ) IVPB 2g/100 mL premix  Status:  Discontinued        2 g 200 mL/hr over 30 Minutes Intravenous 30 min pre-op 01/15/24 9357 01/15/24 0644        Assessment/Plan Mr. Shanker is a 68 yo male with gastric cardia adenocarcinoma, s/p total gastrectomy, with distal esophagectomy and roux-en-Y esophagojejunostomy on 9/3. S/p takeback by thoracic surgery for RATS repair of anastomotic leak on 9/4. S/p EGD with placement of esophageal stent on 9/11. S/p EGD with repositioning of esophageal stent 9/25. - Has had respiratory arrest x2 associated with hypercarbia. Extubated 9/27, now on RA - ICU delirium: this has improved a lot, Clonidine  patch. Delirium precautions.  - Pain control: scheduled tylenol , prn oxycodone  and fentanyl . - Hypertension: home metoprolol  and hydrochlorothiazide. On clonidine  patch for sedation and hypertension. - EJ leak: S/p esophageal stent placement. Pleural JP to remain in place. On antibiotics and antifungal coverage per thoracic, will need 6  weeks total. - FEN: Continue J tube feeds at goal. Patient is tolerating and having bowel function. FWF for hypernatremia - improved - ID: On broad-spectrum abx and antifungal coverage (Merrem  and Micafungin ) for treatment of aspiration pneumonia and EJ leak. To receive a 6-week course of abx per thoracic. WBC trending down on last check and afeb. AM CBC - A-fib: remains in sinus rhythm. On  therapeutic lovenox . - Code status: back to full code per patient wishes - Dispo: 4NP, Klonopin at HS, PT recommending SNF at discharge, patient wishes to go home. Can also consider CIR if he makes significant improvement in activity tolerance.    LOS: 31 days    [name] Shaper, [facility] 02/15/2024, 8:41 AM Please see Amion for pager number during day hours 7:00am-4:30pm

## 2024-02-15 NOTE — Plan of Care (Signed)
 Problem: Education: Goal: Ability to describe self-care measures that may prevent or decrease complications (Diabetes Survival Skills Education) will improve 02/15/2024 0114 by Marvis Algernon SAILOR, RN Outcome: Progressing 02/14/2024 2038 by Marvis Satchel SAILOR, RN Outcome: Progressing Goal: Individualized Educational Video(s) 02/15/2024 0114 by Marvis Cheron SAILOR, RN Outcome: Progressing 02/14/2024 2038 by Marvis Adhrit SAILOR, RN Outcome: Progressing   Problem: Coping: Goal: Ability to adjust to condition or change in health will improve 02/15/2024 0114 by Marvis Clevon SAILOR, RN Outcome: Progressing 02/14/2024 2038 by Marvis Bilaal SAILOR, RN Outcome: Progressing   Problem: Fluid Volume: Goal: Ability to maintain a balanced intake and output will improve 02/15/2024 0114 by Marvis Izaha SAILOR, RN Outcome: Progressing 02/14/2024 2038 by Marvis Tashon SAILOR, RN Outcome: Progressing   Problem: Health Behavior/Discharge Planning: Goal: Ability to identify and utilize available resources and services will improve 02/15/2024 0114 by Marvis Abdulahi SAILOR, RN Outcome: Progressing 02/14/2024 2038 by Marvis Omarri SAILOR, RN Outcome: Progressing Goal: Ability to manage health-related needs will improve 02/15/2024 0114 by Marvis Janiel SAILOR, RN Outcome: Progressing 02/14/2024 2038 by Marvis Kartel SAILOR, RN Outcome: Progressing   Problem: Metabolic: Goal: Ability to maintain appropriate glucose levels will improve 02/15/2024 0114 by Marvis Zion SAILOR, RN Outcome: Progressing 02/14/2024 2038 by Marvis Jahaan SAILOR, RN Outcome: Progressing   Problem: Nutritional: Goal: Maintenance of adequate nutrition will improve 02/15/2024 0114 by Marvis Patryk SAILOR, RN Outcome: Progressing 02/14/2024 2038 by Marvis Luqman SAILOR, RN Outcome: Progressing Goal: Progress toward achieving an optimal weight will improve 02/15/2024 0114 by Marvis Darrly SAILOR, RN Outcome: Progressing 02/14/2024 2038 by Marvis Kamar SAILOR,  RN Outcome: Progressing   Problem: Skin Integrity: Goal: Risk for impaired skin integrity will decrease 02/15/2024 0114 by Marvis Amauri SAILOR, RN Outcome: Progressing 02/14/2024 2038 by Marvis Ardit SAILOR, RN Outcome: Progressing   Problem: Tissue Perfusion: Goal: Adequacy of tissue perfusion will improve 02/15/2024 0114 by Marvis Aldo SAILOR, RN Outcome: Progressing 02/14/2024 2038 by Marvis Philopater SAILOR, RN Outcome: Progressing   Problem: Education: Goal: Knowledge of General Education information will improve Description: Including pain rating scale, medication(s)/side effects and non-pharmacologic comfort measures 02/15/2024 0114 by Marvis Tarek SAILOR, RN Outcome: Progressing 02/14/2024 2038 by Marvis Khup SAILOR, RN Outcome: Progressing   Problem: Clinical Measurements: Goal: Will remain free from infection 02/15/2024 0114 by Marvis Fleetwood SAILOR, RN Outcome: Progressing 02/14/2024 2038 by Marvis Aleksa SAILOR, RN Outcome: Progressing Goal: Respiratory complications will improve 02/15/2024 0114 by Marvis Teven SAILOR, RN Outcome: Progressing 02/14/2024 2038 by Marvis Makenzie SAILOR, RN Outcome: Progressing   Problem: Activity: Goal: Risk for activity intolerance will decrease 02/15/2024 0114 by Marvis Xzavian SAILOR, RN Outcome: Progressing 02/14/2024 2038 by Marvis Bobbie SAILOR, RN Outcome: Progressing   Problem: Pain Managment: Goal: General experience of comfort will improve and/or be controlled 02/15/2024 0114 by Marvis Vondell SAILOR, RN Outcome: Progressing 02/14/2024 2038 by Marvis Argie SAILOR, RN Outcome: Progressing   Problem: Safety: Goal: Ability to remain free from injury will improve 02/15/2024 0114 by Marvis Edy SAILOR, RN Outcome: Progressing 02/14/2024 2038 by Marvis Abelardo SAILOR, RN Outcome: Progressing   Problem: Safety: Goal: Non-violent Restraint(s) 02/15/2024 0114 by Marvis Hendrixx SAILOR, RN Outcome: Progressing 02/14/2024 2038 by Marvis Aydrian SAILOR, RN Outcome:  Progressing   Problem: Activity: Goal: Ability to tolerate increased activity will improve 02/15/2024 0114 by Marvis Javan SAILOR, RN Outcome: Progressing 02/14/2024 2038 by Marvis Sidharth SAILOR, RN Outcome: Progressing   Problem: Respiratory: Goal: Ability to maintain a clear airway and adequate ventilation will improve 02/15/2024 0114 by  Marvis Virginia SAILOR, RN Outcome: Progressing 02/14/2024 2038 by Marvis Markell SAILOR, RN Outcome: Progressing   Problem: Role Relationship: Goal: Method of communication will improve 02/15/2024 0114 by Marvis Umar SAILOR, RN Outcome: Progressing 02/14/2024 2038 by Marvis Gregroy SAILOR, RN Outcome: Progressing

## 2024-02-15 NOTE — Plan of Care (Signed)
  Problem: Education: Goal: Ability to describe self-care measures that may prevent or decrease complications (Diabetes Survival Skills Education) will improve Outcome: Progressing Goal: Individualized Educational Video(s) Outcome: Progressing   Problem: Coping: Goal: Ability to adjust to condition or change in health will improve Outcome: Progressing   Problem: Fluid Volume: Goal: Ability to maintain a balanced intake and output will improve Outcome: Progressing   Problem: Health Behavior/Discharge Planning: Goal: Ability to identify and utilize available resources and services will improve Outcome: Progressing Goal: Ability to manage health-related needs will improve Outcome: Progressing   Problem: Metabolic: Goal: Ability to maintain appropriate glucose levels will improve Outcome: Progressing   Problem: Nutritional: Goal: Maintenance of adequate nutrition will improve Outcome: Progressing Goal: Progress toward achieving an optimal weight will improve Outcome: Progressing   Problem: Skin Integrity: Goal: Risk for impaired skin integrity will decrease Outcome: Progressing   Problem: Tissue Perfusion: Goal: Adequacy of tissue perfusion will improve Outcome: Progressing   Problem: Education: Goal: Knowledge of General Education information will improve Description: Including pain rating scale, medication(s)/side effects and non-pharmacologic comfort measures Outcome: Progressing   Problem: Clinical Measurements: Goal: Will remain free from infection Outcome: Progressing Goal: Respiratory complications will improve Outcome: Progressing   Problem: Activity: Goal: Risk for activity intolerance will decrease Outcome: Progressing   Problem: Pain Managment: Goal: General experience of comfort will improve and/or be controlled Outcome: Progressing   Problem: Safety: Goal: Ability to remain free from injury will improve Outcome: Progressing   Problem:  Safety: Goal: Non-violent Restraint(s) Outcome: Progressing   Problem: Activity: Goal: Ability to tolerate increased activity will improve Outcome: Progressing   Problem: Respiratory: Goal: Ability to maintain a clear airway and adequate ventilation will improve Outcome: Progressing   Problem: Role Relationship: Goal: Method of communication will improve Outcome: Progressing

## 2024-02-16 LAB — GLUCOSE, CAPILLARY
Glucose-Capillary: 135 mg/dL — ABNORMAL HIGH (ref 70–99)
Glucose-Capillary: 185 mg/dL — ABNORMAL HIGH (ref 70–99)
Glucose-Capillary: 114 mg/dL — ABNORMAL HIGH (ref 70–99)
Glucose-Capillary: 195 mg/dL — ABNORMAL HIGH (ref 70–99)
Glucose-Capillary: 162 mg/dL — ABNORMAL HIGH (ref 70–99)
Glucose-Capillary: 156 mg/dL — ABNORMAL HIGH (ref 70–99)

## 2024-02-16 LAB — CBC
HCT: 31.3 % — ABNORMAL LOW (ref 39.0–52.0)
Hemoglobin: 10.1 g/dL — ABNORMAL LOW (ref 13.0–17.0)
MCH: 29.8 pg (ref 26.0–34.0)
MCHC: 32.3 g/dL (ref 30.0–36.0)
MCV: 92.3 fL (ref 80.0–100.0)
Platelets: 628 K/uL — ABNORMAL HIGH (ref 150–400)
RBC: 3.39 MIL/uL — ABNORMAL LOW (ref 4.22–5.81)
RDW: 15.7 % — ABNORMAL HIGH (ref 11.5–15.5)
WBC: 20.9 K/uL — ABNORMAL HIGH (ref 4.0–10.5)
nRBC: 0 % (ref 0.0–0.2)

## 2024-02-16 LAB — BASIC METABOLIC PANEL WITH GFR
Anion gap: 8 (ref 5–15)
BUN: 28 mg/dL — ABNORMAL HIGH (ref 8–23)
CO2: 28 mmol/L (ref 22–32)
Calcium: 8.8 mg/dL — ABNORMAL LOW (ref 8.9–10.3)
Chloride: 106 mmol/L (ref 98–111)
Creatinine, Ser: 0.69 mg/dL (ref 0.61–1.24)
GFR, Estimated: >60 mL/min (ref >60)
Glucose, Bld: 158 mg/dL — ABNORMAL HIGH (ref 70–99)
Potassium: 3.7 mmol/L (ref 3.5–5.1)
Sodium: 142 mmol/L (ref 135–145)

## 2024-02-16 MED ORDER — INSULIN ASPART 100 UNIT/ML IJ SOLN
0.0000 [IU] | Freq: Four times a day (QID) | INTRAMUSCULAR | Status: DC
  Administered 2024-02-16: 1 [IU] via SUBCUTANEOUS
  Administered 2024-02-16 – 2024-02-17 (×2): 2 [IU] via SUBCUTANEOUS
  Administered 2024-02-17: 1 [IU] via SUBCUTANEOUS
  Administered 2024-02-17 – 2024-02-19 (×6): 2 [IU] via SUBCUTANEOUS
  Administered 2024-02-19: 1 [IU] via SUBCUTANEOUS
  Administered 2024-02-19 (×2): 2 [IU] via SUBCUTANEOUS
  Administered 2024-02-20 (×2): 1 [IU] via SUBCUTANEOUS
  Administered 2024-02-20 – 2024-02-21 (×2): 2 [IU] via SUBCUTANEOUS
  Administered 2024-02-21 – 2024-02-24 (×9): 1 [IU] via SUBCUTANEOUS
  Administered 2024-02-24: 2 [IU] via SUBCUTANEOUS
  Administered 2024-02-24: 1 [IU] via SUBCUTANEOUS
  Administered 2024-02-24 – 2024-02-25 (×2): 2 [IU] via SUBCUTANEOUS
  Administered 2024-02-26: 1 [IU] via SUBCUTANEOUS
  Administered 2024-02-26: 2 [IU] via SUBCUTANEOUS
  Administered 2024-02-26: 1 [IU] via SUBCUTANEOUS
  Administered 2024-02-27: 2 [IU] via SUBCUTANEOUS
  Administered 2024-02-27: 1 [IU] via SUBCUTANEOUS
  Administered 2024-02-28: 2 [IU] via SUBCUTANEOUS
  Administered 2024-02-28: 1 [IU] via SUBCUTANEOUS
  Administered 2024-02-28: 2 [IU] via SUBCUTANEOUS
  Administered 2024-02-29 – 2024-03-01 (×4): 1 [IU] via SUBCUTANEOUS
  Administered 2024-03-01: 2 [IU] via SUBCUTANEOUS
  Administered 2024-03-01 – 2024-03-02 (×3): 1 [IU] via SUBCUTANEOUS
  Administered 2024-03-03: 2 [IU] via SUBCUTANEOUS
  Administered 2024-03-03: 1 [IU] via SUBCUTANEOUS
  Administered 2024-03-03 – 2024-03-04 (×3): 2 [IU] via SUBCUTANEOUS
  Administered 2024-03-04 (×2): 1 [IU] via SUBCUTANEOUS
  Administered 2024-03-04 – 2024-03-06 (×3): 2 [IU] via SUBCUTANEOUS
  Administered 2024-03-06 – 2024-03-08 (×6): 1 [IU] via SUBCUTANEOUS

## 2024-02-16 NOTE — Plan of Care (Signed)
 Pt tolerated ice chips well, turns done q2hrs, had a bath, mouth care done 3x during shift and pt assisted.

## 2024-02-16 NOTE — Progress Notes (Signed)
 10 Days Post-Op   Subjective/Chief Complaint: No complaints. Wants to eat something   Objective: Vital signs in last 24 hours: Temp:  [97.7 F (36.5 C)-99.7 F (37.6 C)] 98.4 F (36.9 C) (10/05 0700) Pulse Rate:  [91-109] 109 (10/05 0700) Resp:  [18-32] 32 (10/05 0700) BP: (127-158)/(73-102) 127/102 (10/05 0700) SpO2:  [94 %-100 %] 94 % (10/05 0700) Weight:  [66.9 kg] 66.9 kg (10/05 0429) Last BM Date : 02/15/24  Intake/Output from previous day: 10/04 0701 - 10/05 0700 In: 1691.5 [I.V.:10; NG/GT:1185; IV Piggyback:496.5] Out: 3325 [Urine:2250; Drains:75; Stool:1000] Intake/Output this shift: No intake/output data recorded.  General appearance: alert and cooperative Resp: clear to auscultation bilaterally Cardio: regular rate and rhythm GI: soft, nontender. Tube feeds at goal. Drain output dark cloudy  Lab Results:  Recent Labs    02/14/24 0548 02/16/24 0508  WBC 24.5* 20.9*  HGB 10.2* 10.1*  HCT 33.0* 31.3*  PLT 551* 628*   BMET Recent Labs    02/15/24 0604 02/16/24 0508  NA 142 142  K 3.6 3.7  CL 105 106  CO2 26 28  GLUCOSE 197* 158*  BUN 28* 28*  CREATININE 0.70 0.69  CALCIUM  8.7* 8.8*   PT/INR No results for input(s): LABPROT, INR in the last 72 hours. ABG No results for input(s): PHART, HCO3 in the last 72 hours.  Invalid input(s): PCO2, PO2  Studies/Results: No results found.  Anti-infectives: Anti-infectives (From admission, onward)    Start     Dose/Rate Route Frequency Ordered Stop   02/07/24 2000  vancomycin  (VANCOCIN ) IVPB 1000 mg/200 mL premix  Status:  Discontinued        1,000 mg 200 mL/hr over 60 Minutes Intravenous Every 12 hours 02/07/24 1229 02/08/24 0725   02/07/24 0915  vancomycin  (VANCOREADY) IVPB 1500 mg/300 mL        1,500 mg 150 mL/hr over 120 Minutes Intravenous  Once 02/07/24 0823 02/07/24 1125   02/02/24 0400  micafungin  (MYCAMINE ) 100 mg in sodium chloride  0.9 % 100 mL IVPB        100 mg 105 mL/hr over  1 Hours Intravenous Daily 02/02/24 0248 03/15/24 1400   02/02/24 0400  meropenem  (MERREM ) 1,000 mg in sodium chloride  0.9 % 100 mL IVPB        1,000 mg 200 mL/hr over 30 Minutes Intravenous Every 8 hours 02/02/24 0248 03/15/24 1400   02/02/24 0315  vancomycin  (VANCOCIN ) IVPB 1000 mg/200 mL premix        1,000 mg 200 mL/hr over 60 Minutes Intravenous  Once 02/02/24 0228 02/02/24 0510   01/31/24 1800  piperacillin -tazobactam (ZOSYN ) IVPB 3.375 g  Status:  Discontinued        3.375 g 12.5 mL/hr over 240 Minutes Intravenous Every 8 hours 01/31/24 1639 02/02/24 0248   01/31/24 1745  fluconazole  (DIFLUCAN ) IVPB 400 mg  Status:  Discontinued        400 mg 100 mL/hr over 120 Minutes Intravenous Every 24 hours 01/31/24 1659 02/02/24 0248   01/22/24 1000  fluconazole  (DIFLUCAN ) IVPB 400 mg       Placed in Followed by Linked Group   400 mg 100 mL/hr over 120 Minutes Intravenous Every 24 hours 01/21/24 0956 01/30/24 1134   01/21/24 1045  fluconazole  (DIFLUCAN ) IVPB 800 mg       Placed in Followed by Linked Group   800 mg 200 mL/hr over 120 Minutes Intravenous  Once 01/21/24 0956 01/21/24 1441   01/21/24 0945  piperacillin -tazobactam (ZOSYN ) IVPB 3.375 g  3.375 g 12.5 mL/hr over 240 Minutes Intravenous Every 8 hours 01/21/24 0920 01/30/24 2216   01/16/24 1510  ceFAZolin  (ANCEF ) 2-4 GM/100ML-% IVPB       Note to Pharmacy: Cindie Pizza: cabinet override      01/16/24 1510 01/17/24 0314   01/15/24 0645  ceFAZolin  (ANCEF ) IVPB 2g/100 mL premix       Placed in And Linked Group   2 g 200 mL/hr over 30 Minutes Intravenous On call to O.R. 01/15/24 0641 01/15/24 1700   01/15/24 0645  metroNIDAZOLE  (FLAGYL ) IVPB 500 mg       Placed in And Linked Group   500 mg 100 mL/hr over 60 Minutes Intravenous On call to O.R. 01/15/24 9358 01/15/24 0910   01/15/24 9357  ceFAZolin  (ANCEF ) IVPB 2g/100 mL premix  Status:  Discontinued        2 g 200 mL/hr over 30 Minutes Intravenous 30 min pre-op  01/15/24 0642 01/15/24 0644       Assessment/Plan: s/p Procedure(s) with comments: EGD (ESOPHAGOGASTRODUODENOSCOPY) (N/A) - will need C arm Continue tube feeds Continue drains Mr. [name] is a 68 yo male with gastric cardia adenocarcinoma, s/p total gastrectomy, with distal esophagectomy and roux-en-Y esophagojejunostomy on 9/3. S/p takeback by thoracic surgery for RATS repair of anastomotic leak on 9/4. S/p EGD with placement of esophageal stent on 9/11. S/p EGD with repositioning of esophageal stent 9/25. - Has had respiratory arrest x2 associated with hypercarbia. Extubated 9/27, now on RA - ICU delirium: this has improved a lot, Clonidine  patch. Delirium precautions.  - Pain control: scheduled tylenol , prn oxycodone  and fentanyl . - Hypertension: home metoprolol  and hydrochlorothiazide. On clonidine  patch for sedation and hypertension. - EJ leak: S/p esophageal stent placement. Pleural JP to remain in place. On antibiotics and antifungal coverage per thoracic, will need 6 weeks total. - FEN: Continue J tube feeds at goal. Patient is tolerating and having bowel function. FWF for hypernatremia - improved - ID: On broad-spectrum abx and antifungal coverage (Merrem  and Micafungin ) for treatment of aspiration pneumonia and EJ leak. To receive a 6-week course of abx per thoracic. WBC trending down on last check and afeb. AM CBC - A-fib: remains in sinus rhythm. On therapeutic lovenox . - Code status: back to full code per patient wishes - Dispo: 4NP, Klonopin at HS, PT recommending SNF at discharge, patient wishes to go home. Can also consider CIR if he makes significant improvement in activity tolerance.  LOS: 32 days    [name] 02/16/2024

## 2024-02-16 NOTE — Plan of Care (Signed)
 Problem: Education: Goal: Ability to describe self-care measures that may prevent or decrease complications (Diabetes Survival Skills Education) will improve 02/16/2024 0015 by Marvis Rafferty SAILOR, RN Outcome: Progressing 02/15/2024 2019 by Marvis Jahdiel SAILOR, RN Outcome: Progressing Goal: Individualized Educational Video(s) 02/16/2024 0015 by Marvis Nichoals SAILOR, RN Outcome: Progressing 02/15/2024 2019 by Marvis Kimari SAILOR, RN Outcome: Progressing   Problem: Coping: Goal: Ability to adjust to condition or change in health will improve 02/16/2024 0015 by Marvis Anil SAILOR, RN Outcome: Progressing 02/15/2024 2019 by Marvis Arden SAILOR, RN Outcome: Progressing   Problem: Fluid Volume: Goal: Ability to maintain a balanced intake and output will improve 02/16/2024 0015 by Marvis Adriene SAILOR, RN Outcome: Progressing 02/15/2024 2019 by Marvis Linton SAILOR, RN Outcome: Progressing   Problem: Health Behavior/Discharge Planning: Goal: Ability to identify and utilize available resources and services will improve 02/16/2024 0015 by Marvis Ava SAILOR, RN Outcome: Progressing 02/15/2024 2019 by Marvis Yidel SAILOR, RN Outcome: Progressing Goal: Ability to manage health-related needs will improve 02/16/2024 0015 by Marvis Jaece SAILOR, RN Outcome: Progressing 02/15/2024 2019 by Marvis Jaimon SAILOR, RN Outcome: Progressing   Problem: Metabolic: Goal: Ability to maintain appropriate glucose levels will improve 02/16/2024 0015 by Marvis Undrea SAILOR, RN Outcome: Progressing 02/15/2024 2019 by Marvis Keric SAILOR, RN Outcome: Progressing   Problem: Nutritional: Goal: Maintenance of adequate nutrition will improve 02/16/2024 0015 by Marvis Lang SAILOR, RN Outcome: Progressing 02/15/2024 2019 by Marvis Bladen SAILOR, RN Outcome: Progressing Goal: Progress toward achieving an optimal weight will improve 02/16/2024 0015 by Marvis Roshad SAILOR, RN Outcome: Progressing 02/15/2024 2019 by Marvis Male SAILOR,  RN Outcome: Progressing   Problem: Skin Integrity: Goal: Risk for impaired skin integrity will decrease 02/16/2024 0015 by Marvis Cruise SAILOR, RN Outcome: Progressing 02/15/2024 2019 by Marvis Demetrick SAILOR, RN Outcome: Progressing   Problem: Tissue Perfusion: Goal: Adequacy of tissue perfusion will improve 02/16/2024 0015 by Marvis Tobe SAILOR, RN Outcome: Progressing 02/15/2024 2019 by Marvis Sehaj SAILOR, RN Outcome: Progressing   Problem: Education: Goal: Knowledge of General Education information will improve Description: Including pain rating scale, medication(s)/side effects and non-pharmacologic comfort measures 02/16/2024 0015 by Marvis Ingram SAILOR, RN Outcome: Progressing 02/15/2024 2019 by Marvis Masaru SAILOR, RN Outcome: Progressing   Problem: Clinical Measurements: Goal: Will remain free from infection 02/16/2024 0015 by Marvis Jacquese SAILOR, RN Outcome: Progressing 02/15/2024 2019 by Marvis Massimo SAILOR, RN Outcome: Progressing Goal: Respiratory complications will improve 02/16/2024 0015 by Marvis Jiles SAILOR, RN Outcome: Progressing 02/15/2024 2019 by Marvis Olen SAILOR, RN Outcome: Progressing   Problem: Activity: Goal: Risk for activity intolerance will decrease 02/16/2024 0015 by Marvis Captain SAILOR, RN Outcome: Progressing 02/15/2024 2019 by Marvis Zayed SAILOR, RN Outcome: Progressing   Problem: Pain Managment: Goal: General experience of comfort will improve and/or be controlled 02/16/2024 0015 by Marvis Arif SAILOR, RN Outcome: Progressing 02/15/2024 2019 by Marvis Grove SAILOR, RN Outcome: Progressing   Problem: Safety: Goal: Ability to remain free from injury will improve 02/16/2024 0015 by Marvis Riyaan SAILOR, RN Outcome: Progressing 02/15/2024 2019 by Marvis Yaden SAILOR, RN Outcome: Progressing   Problem: Safety: Goal: Non-violent Restraint(s) 02/16/2024 0015 by Marvis Aleck SAILOR, RN Outcome: Progressing 02/15/2024 2019 by Marvis Haydin SAILOR, RN Outcome:  Progressing   Problem: Activity: Goal: Ability to tolerate increased activity will improve 02/16/2024 0015 by Marvis Lamel SAILOR, RN Outcome: Progressing 02/15/2024 2019 by Marvis Raymar SAILOR, RN Outcome: Progressing   Problem: Respiratory: Goal: Ability to maintain a clear airway and adequate ventilation will improve 02/16/2024 0015 by  Marvis Joshu SAILOR, RN Outcome: Progressing 02/15/2024 2019 by Marvis Zacherie SAILOR, RN Outcome: Progressing   Problem: Role Relationship: Goal: Method of communication will improve 02/16/2024 0015 by Marvis Janelle SAILOR, RN Outcome: Progressing 02/15/2024 2019 by Marvis Derrius SAILOR, RN Outcome: Progressing

## 2024-02-17 LAB — GLUCOSE, CAPILLARY
Glucose-Capillary: 109 mg/dL — ABNORMAL HIGH (ref 70–99)
Glucose-Capillary: 131 mg/dL — ABNORMAL HIGH (ref 70–99)
Glucose-Capillary: 164 mg/dL — ABNORMAL HIGH (ref 70–99)
Glucose-Capillary: 195 mg/dL — ABNORMAL HIGH (ref 70–99)

## 2024-02-17 NOTE — Plan of Care (Signed)
 Problem: Education: Goal: Ability to describe self-care measures that may prevent or decrease complications (Diabetes Survival Skills Education) will improve 02/17/2024 0005 by Marvis London SAILOR, RN Outcome: Progressing 02/16/2024 2022 by Marvis Jyair SAILOR, RN Outcome: Progressing Goal: Individualized Educational Video(s) 02/17/2024 0005 by Marvis Scotland SAILOR, RN Outcome: Progressing 02/16/2024 2022 by Marvis Maaz SAILOR, RN Outcome: Progressing   Problem: Coping: Goal: Ability to adjust to condition or change in health will improve 02/17/2024 0005 by Marvis Michio SAILOR, RN Outcome: Progressing 02/16/2024 2022 by Marvis Edis SAILOR, RN Outcome: Progressing   Problem: Fluid Volume: Goal: Ability to maintain a balanced intake and output will improve 02/17/2024 0005 by Marvis Alezander SAILOR, RN Outcome: Progressing 02/16/2024 2022 by Marvis Idrees SAILOR, RN Outcome: Progressing   Problem: Health Behavior/Discharge Planning: Goal: Ability to identify and utilize available resources and services will improve 02/17/2024 0005 by Marvis Bryker SAILOR, RN Outcome: Progressing 02/16/2024 2022 by Marvis Kamori SAILOR, RN Outcome: Progressing Goal: Ability to manage health-related needs will improve 02/17/2024 0005 by Marvis Pacey SAILOR, RN Outcome: Progressing 02/16/2024 2022 by Marvis Jaimie SAILOR, RN Outcome: Progressing   Problem: Metabolic: Goal: Ability to maintain appropriate glucose levels will improve 02/17/2024 0005 by Marvis Kashaun SAILOR, RN Outcome: Progressing 02/16/2024 2022 by Marvis Merel SAILOR, RN Outcome: Progressing   Problem: Nutritional: Goal: Maintenance of adequate nutrition will improve 02/17/2024 0005 by Marvis Elby SAILOR, RN Outcome: Progressing 02/16/2024 2022 by Marvis Edwyn SAILOR, RN Outcome: Progressing Goal: Progress toward achieving an optimal weight will improve 02/17/2024 0005 by Marvis Avyan SAILOR, RN Outcome: Progressing 02/16/2024 2022 by Marvis Yates SAILOR,  RN Outcome: Progressing   Problem: Skin Integrity: Goal: Risk for impaired skin integrity will decrease 02/17/2024 0005 by Marvis Raider SAILOR, RN Outcome: Progressing 02/16/2024 2022 by Marvis Tidus SAILOR, RN Outcome: Progressing   Problem: Tissue Perfusion: Goal: Adequacy of tissue perfusion will improve 02/17/2024 0005 by Marvis Kyl SAILOR, RN Outcome: Progressing 02/16/2024 2022 by Marvis Antrone SAILOR, RN Outcome: Progressing   Problem: Education: Goal: Knowledge of General Education information will improve Description: Including pain rating scale, medication(s)/side effects and non-pharmacologic comfort measures 02/17/2024 0005 by Marvis Darrien SAILOR, RN Outcome: Progressing 02/16/2024 2022 by Marvis Jaydeen SAILOR, RN Outcome: Progressing   Problem: Clinical Measurements: Goal: Will remain free from infection 02/17/2024 0005 by Marvis Naod SAILOR, RN Outcome: Progressing 02/16/2024 2022 by Marvis Debra SAILOR, RN Outcome: Progressing Goal: Respiratory complications will improve 02/17/2024 0005 by Marvis Trumaine SAILOR, RN Outcome: Progressing 02/16/2024 2022 by Marvis Geovani SAILOR, RN Outcome: Progressing   Problem: Activity: Goal: Risk for activity intolerance will decrease 02/17/2024 0005 by Marvis Kohlton SAILOR, RN Outcome: Progressing 02/16/2024 2022 by Marvis Ulyess SAILOR, RN Outcome: Progressing   Problem: Pain Managment: Goal: General experience of comfort will improve and/or be controlled 02/17/2024 0005 by Marvis Kimari SAILOR, RN Outcome: Progressing 02/16/2024 2022 by Marvis Rafael SAILOR, RN Outcome: Progressing   Problem: Safety: Goal: Ability to remain free from injury will improve 02/17/2024 0005 by Marvis Wright SAILOR, RN Outcome: Progressing 02/16/2024 2022 by Marvis Ziaire SAILOR, RN Outcome: Progressing   Problem: Safety: Goal: Non-violent Restraint(s) 02/17/2024 0005 by Marvis Rodell SAILOR, RN Outcome: Progressing 02/16/2024 2022 by Marvis Teren SAILOR, RN Outcome:  Progressing   Problem: Activity: Goal: Ability to tolerate increased activity will improve 02/17/2024 0005 by Marvis Sampson SAILOR, RN Outcome: Progressing 02/16/2024 2022 by Marvis Mckenna SAILOR, RN Outcome: Progressing   Problem: Respiratory: Goal: Ability to maintain a clear airway and adequate ventilation will improve 02/17/2024 0005 by  Marvis Orestes SAILOR, RN Outcome: Progressing 02/16/2024 2022 by Marvis Finneas SAILOR, RN Outcome: Progressing   Problem: Role Relationship: Goal: Method of communication will improve 02/17/2024 0005 by Marvis Roston SAILOR, RN Outcome: Progressing 02/16/2024 2022 by Marvis Fard SAILOR, RN Outcome: Progressing

## 2024-02-17 NOTE — Progress Notes (Addendum)
 Physical Therapy Treatment Patient Details Name: [name] MRN: [id number] DOB: October 26, 1955 Today's Date: 02/17/2024   History of Present Illness 68 y.o. male presents to [facility] on 01/15/2024 with gastric CA. Pt underwent total gastrectomy with omentectomy and D2 lymphadenectomy, esophagojejunostomy with Jtube placement, splenic biopsy on 9/3. Pt developed bilious output into chest tube on 9/4, returned to OR for EGD and thoracoscopy with application of wound matrix to EJ anastomosis. ETT 9/11-9/12 for desats and unresponsiveness. S/p EGD and esophageal stent placement 9/11. Cardiac arrest 9/19. Re-ett 9/19-9/22. 9/26 PEA respiratory failure with intubation extubation 9/27 PMH includes asthma, OA, COPD, GERD, DMII, MDD, emphysema.    PT Comments  Pt resting upon PT arrival to room, wakes easily and is agreeable to PT session. Pt overall requiring min-mod assist for bed mobility, transfers, and short-distance gait in room. Pt with very little reserve and fatigues quickly after 5 ft gait, also endorsing mild dizziness. Pt to benefit from post-acute rehab to address deficits.   RR 30s, HR 110s, and SBP drop 130>112>118 with activity     If plan is discharge home, recommend the following: A lot of help with walking and/or transfers;A lot of help with bathing/dressing/bathroom;Direct supervision/assist for medications management;Assistance with feeding;Help with stairs or ramp for entrance;Supervision due to cognitive status   Can travel by private vehicle        Equipment Recommendations  Other (comment) (defer)    Recommendations for Other Services       Precautions / Restrictions Precautions Precautions: Fall Recall of Precautions/Restrictions: Impaired Precaution/Restrictions Comments: R JP drain x 1, J tube, R port Restrictions Weight Bearing Restrictions Per Provider Order: No     Mobility  Bed Mobility Overal bed mobility: Needs Assistance Bed Mobility: Supine to Sit      Supine to sit: Mod assist     General bed mobility comments: assist for trunk elevation, LE progression to EOB, HHA helpful    Transfers Overall transfer level: Needs assistance Equipment used: Rolling walker (2 wheels) Transfers: Sit to/from Stand Sit to Stand: Min assist           General transfer comment: assist for power up and steadying once standing.    Ambulation/Gait Ambulation/Gait assistance: Min assist Gait Distance (Feet): 5 Feet Assistive device: Rolling walker (2 wheels) Gait Pattern/deviations: Step-through pattern, Decreased stride length, Trunk flexed, Knee flexed in stance - right, Knee flexed in stance - left Gait velocity: decr     General Gait Details: assist to steady, cues for upright posture, placemetn in RW   Stairs             Wheelchair Mobility     Tilt Bed    Modified Rankin (Stroke Patients Only)       Balance Overall balance assessment: Needs assistance Sitting-balance support: Bilateral upper extremity supported, Feet supported Sitting balance-Leahy Scale: Fair     Standing balance support: Bilateral upper extremity supported, During functional activity, Reliant on assistive device for balance Standing balance-Leahy Scale: Poor Standing balance comment: reliant on bilat UE support                            Communication Communication Communication: Impaired Factors Affecting Communication: Reduced clarity of speech  Cognition Arousal: Alert Behavior During Therapy: Flat affect   PT - Cognitive impairments: Orientation, Awareness, Memory, Problem solving, Safety/Judgement, Attention, Initiation  PT - Cognition Comments: pt A&Ox4, difficult to understand at times and softspoken Following commands: Intact Following commands impaired: Follows one step commands with increased time    Cueing Cueing Techniques: Verbal cues, Tactile cues  Exercises      General Comments  General comments (skin integrity, edema, etc.): RR 30s, HR 110s, and SBP drop 130>118 with activity      Pertinent Vitals/Pain Pain Assessment Pain Assessment: Faces Faces Pain Scale: Hurts even more Pain Location: back, chest Pain Descriptors / Indicators: Grimacing, Guarding, Discomfort Pain Intervention(s): Limited activity within patient's tolerance, Monitored during session, Repositioned    Home Living                          Prior Function            PT Goals (current goals can now be found in the care plan section) Acute Rehab PT Goals Patient Stated Goal: to return to independence Time For Goal Achievement: 03/02/24 Potential to Achieve Goals: Fair Progress towards PT goals: Progressing toward goals    Frequency    Min 2X/week      PT Plan      Co-evaluation              AM-PAC PT 6 Clicks Mobility   Outcome Measure  Help needed turning from your back to your side while in a flat bed without using bedrails?: A Lot Help needed moving from lying on your back to sitting on the side of a flat bed without using bedrails?: A Lot Help needed moving to and from a bed to a chair (including a wheelchair)?: A Lot Help needed standing up from a chair using your arms (e.g., wheelchair or bedside chair)?: A Lot Help needed to walk in hospital room?: Total Help needed climbing 3-5 steps with a railing? : Total 6 Click Score: 10    End of Session Equipment Utilized During Treatment: Gait belt Activity Tolerance: Patient limited by pain;Patient limited by fatigue Patient left: with call bell/phone within reach;in chair;with chair alarm set Nurse Communication: Mobility status (spoke with NT) PT Visit Diagnosis: Other abnormalities of gait and mobility (R26.89);Muscle weakness (generalized) (M62.81);Pain     Time: 8755-8686 PT Time Calculation (min) (ACUTE ONLY): 29 min  Charges:    $Therapeutic Activity: 23-37 mins PT General Charges $$ ACUTE  PT VISIT: 1 Visit                     [name], PT DPT Acute Rehabilitation Services Secure Chat Preferred  Office [phone]    Clifton Kovacic FORBES [place] 02/17/2024, 4:32 PM

## 2024-02-17 NOTE — Progress Notes (Signed)
 11 Days Post-Op   Subjective/Chief Complaint: Pt complains about everything.     Objective: Vital signs in last 24 hours: Temp:  [97.6 F (36.4 C)-99.3 F (37.4 C)] 97.6 F (36.4 C) (10/06 0838) Pulse Rate:  [84-116] 97 (10/06 0349) Resp:  [18-24] 20 (10/06 0838) BP: (129-160)/(69-93) 159/93 (10/06 0838) SpO2:  [96 %-98 %] 96 % (10/06 0838) Weight:  [72.7 kg] 72.7 kg (10/06 0458) Last BM Date : 02/16/24  Intake/Output from previous day: 10/05 0701 - 10/06 0700 In: 610 [I.V.:10; NG/GT:600] Out: 2200 [Urine:1900; Drains:175; Stool:125] Intake/Output this shift: No intake/output data recorded.  General appearance: alert  Resp: breathing comfortably Cardio: regular rate and rhythm GI: soft, nontender. Tube feeds at goal. Drain output murky  Lab Results:  Recent Labs    02/16/24 0508  WBC 20.9*  HGB 10.1*  HCT 31.3*  PLT 628*   BMET Recent Labs    02/15/24 0604 02/16/24 0508  NA 142 142  K 3.6 3.7  CL 105 106  CO2 26 28  GLUCOSE 197* 158*  BUN 28* 28*  CREATININE 0.70 0.69  CALCIUM  8.7* 8.8*   PT/INR No results for input(s): LABPROT, INR in the last 72 hours. ABG No results for input(s): PHART, HCO3 in the last 72 hours.  Invalid input(s): PCO2, PO2  Studies/Results: No results found.  Anti-infectives: Anti-infectives (From admission, onward)    Start     Dose/Rate Route Frequency Ordered Stop   02/07/24 2000  vancomycin  (VANCOCIN ) IVPB 1000 mg/200 mL premix  Status:  Discontinued        1,000 mg 200 mL/hr over 60 Minutes Intravenous Every 12 hours 02/07/24 1229 02/08/24 0725   02/07/24 0915  vancomycin  (VANCOREADY) IVPB 1500 mg/300 mL        1,500 mg 150 mL/hr over 120 Minutes Intravenous  Once 02/07/24 0823 02/07/24 1125   02/02/24 0400  micafungin  (MYCAMINE ) 100 mg in sodium chloride  0.9 % 100 mL IVPB        100 mg 105 mL/hr over 1 Hours Intravenous Daily 02/02/24 0248 03/15/24 1400   02/02/24 0400  meropenem  (MERREM ) 1,000 mg in  sodium chloride  0.9 % 100 mL IVPB        1,000 mg 200 mL/hr over 30 Minutes Intravenous Every 8 hours 02/02/24 0248 03/15/24 1400   02/02/24 0315  vancomycin  (VANCOCIN ) IVPB 1000 mg/200 mL premix        1,000 mg 200 mL/hr over 60 Minutes Intravenous  Once 02/02/24 0228 02/02/24 0510   01/31/24 1800  piperacillin -tazobactam (ZOSYN ) IVPB 3.375 g  Status:  Discontinued        3.375 g 12.5 mL/hr over 240 Minutes Intravenous Every 8 hours 01/31/24 1639 02/02/24 0248   01/31/24 1745  fluconazole  (DIFLUCAN ) IVPB 400 mg  Status:  Discontinued        400 mg 100 mL/hr over 120 Minutes Intravenous Every 24 hours 01/31/24 1659 02/02/24 0248   01/22/24 1000  fluconazole  (DIFLUCAN ) IVPB 400 mg       Placed in Followed by Linked Group   400 mg 100 mL/hr over 120 Minutes Intravenous Every 24 hours 01/21/24 0956 01/30/24 1134   01/21/24 1045  fluconazole  (DIFLUCAN ) IVPB 800 mg       Placed in Followed by Linked Group   800 mg 200 mL/hr over 120 Minutes Intravenous  Once 01/21/24 0956 01/21/24 1441   01/21/24 0945  piperacillin -tazobactam (ZOSYN ) IVPB 3.375 g        3.375 g 12.5 mL/hr over 240  Minutes Intravenous Every 8 hours 01/21/24 0920 01/30/24 2216   01/16/24 1510  ceFAZolin  (ANCEF ) 2-4 GM/100ML-% IVPB       Note to Pharmacy: Cindie Pizza: cabinet override      01/16/24 1510 01/17/24 0314   01/15/24 0645  ceFAZolin  (ANCEF ) IVPB 2g/100 mL premix       Placed in And Linked Group   2 g 200 mL/hr over 30 Minutes Intravenous On call to O.R. 01/15/24 0641 01/15/24 1700   01/15/24 0645  metroNIDAZOLE  (FLAGYL ) IVPB 500 mg       Placed in And Linked Group   500 mg 100 mL/hr over 60 Minutes Intravenous On call to O.R. 01/15/24 9358 01/15/24 0910   01/15/24 9357  ceFAZolin  (ANCEF ) IVPB 2g/100 mL premix  Status:  Discontinued        2 g 200 mL/hr over 30 Minutes Intravenous 30 min pre-op 01/15/24 0642 01/15/24 0644       Assessment/Plan: s/p Procedure(s) with comments: EGD  (ESOPHAGOGASTRODUODENOSCOPY) (N/A) - will need C arm Continue tube feeds Continue drains Mr. Lacount is a 68 yo male with gastric cardia adenocarcinoma, s/p total gastrectomy, with distal esophagectomy and roux-en-Y esophagojejunostomy on 9/3. S/p takeback by thoracic surgery for RATS repair of anastomotic leak on 9/4. S/p EGD with placement of esophageal stent on 9/11. S/p EGD with repositioning of esophageal stent 9/25. - Has had respiratory arrest x2 associated with hypercarbia. Extubated 9/27, now on RA - ICU delirium: this has improved, Clonidine  patch. Delirium precautions.  - Pain control: scheduled tylenol , prn oxycodone  and fentanyl . - Hypertension: home metoprolol  and hydrochlorothiazide. On clonidine  patch for sedation and hypertension. - EJ leak: S/p esophageal stent placement. Pleural JP to remain in place. On antibiotics and antifungal coverage per thoracic, will need 6 weeks total. - FEN: Continue J tube feeds at goal. Patient is tolerating and having bowel function. FWF for hypernatremia - improved - ID: On broad-spectrum abx and antifungal coverage (Merrem  and Micafungin ) for treatment of aspiration pneumonia and EJ leak. To receive a 6-week course of abx per thoracic. WBC trending down on last check and afeb. - A-fib: remains in sinus rhythm. On therapeutic lovenox . - Code status: back to full code per patient wishes - Dispo: 4NP, Klonopin at HS, PT recommending SNF at discharge, patient wishes to go home. Can also consider CIR if he makes significant improvement in activity tolerance.   LOS: 33 days   Jina LITTIE Nephew, MD, FACS, FSSO Surgical Oncology, General Surgery, Trauma and Critical [facility], [place] [phone] for weekday/non holidays Check [email or web address] for coverage night/weekend/holidays

## 2024-02-17 NOTE — Progress Notes (Addendum)
 Nutrition Follow-up  DOCUMENTATION CODES:   Severe malnutrition in context of chronic illness  INTERVENTION:  Continue tube feeding via J-tube: Continue Osmolite 1.5 @ 65 ml/h (1560 ml per day) Prosource TF20 60 ml BID   Provides 2500 kcal, 137 gm protein, 1185 ml free water  daily  Monitor for diet advancement   NUTRITION DIAGNOSIS:   Severe Malnutrition related to cancer and cancer related treatments as evidenced by severe muscle depletion, moderate fat depletion, energy intake < or equal to 75% for > or equal to 1 month, percent weight loss (has lost 28 lbs, 15% in 6 months). - Ongoing  GOAL:   Patient will meet greater than or equal to 90% of their needs - Meeting via TF  MONITOR:   Diet advancement, TF tolerance, Labs, I & O's, Skin  REASON FOR ASSESSMENT:   Consult Assessment of nutrition requirement/status, Enteral/tube feeding initiation and management  ASSESSMENT:  Mr. Cabiness is a 68 yo male who was diagnosed with uT2N0 adenocarcinoma of the gastric cardia earlier this year. EUS showed evidence of invasion into the GE junction. The patient completed 3 cycles neoadjuvant FLOT with some complications, and was not able to tolerate further chemotherapy. Presented to [facility] for surgery now post op total gastrectomy with distal esophagectomy, roux-en-Y esophagojejunostomy, J-tube. PMH of T2DM, GERD, HTN, a-fib, hernia repair.  9/3 - total gastrectomy with distal esophagectomy, roux-en-Y esophagojejunostomy, J-tube, chest tube, NGT placed to lIS  9/4 - s/p Thoracoscopy repair of esophageal leak using Myriad Matrix  9/6 - transferred to ICU for hypoxia and increased o2 requirements 9/7 - Trickle tube feeds started, 20 ml/hr 9/8 - Tube feeds advanced to 30 ml/hr, transferred to progressive  9/9 - Tube feeds advanced to 40 ml/hr increase by 10 ml every 6 hours until goal of 60 ml/hr 9/10 - TF held 9/11 - Back to OR for leak, esophageal stent placed, intubated 9/12 -  Extubated  9/20 - cardiac arrest with suspected aspiration event 9/25 - arrest overnight, ROSC with Epi; intubated; TF held 9/27 - extubated; TF resumed at trickle 9/29 - TF to goal, TF increased to 65 ml/hr for wt loss 10/3 - Transferred to progressive   Continues to tolerate J-tube feeds at goal. Having BM, has FMS. Pt continues to be confused, speech mumbled. Pt states he wants to go home. No N/V. Still having abdominal pain. Wants to eat.    Pt's weight appears stated on admission.Weight has fluctuated since admission due to fluid/drains. Difficult to access amount of weight loss but overall pt appears to be losing weight. Repeat exam continues to show serve muscle and fat wasting  New weight of 74.2 kg today. Tube feed rate increased to 65 ml/hr meeting 100% kcal and protein needs. Monitor weight trends, may need to increase tube feeding rate again if pt continues to lose weight.     Intake/Output Summary (Last 24 hours) at 02/17/2024 0958 Last data filed at 02/17/2024 0612 Gross per 24 hour  Intake 610 ml  Output 2200 ml  Net -1590 ml   Drains/Lines: JP Chest tube: 175 ml x 24 hours  NGT: 275 ml x 24 hours   J- tube FMS: 125 ml x 24 hours   Admit weight: 80.7 kg - Stated?  Current weight: 74.2 kg   Average Meal Intake: NPO  Nutritionally Relevant Medications: Scheduled Meds:  feeding supplement (PROSource TF20)  60 mL Per Tube BID   free water   200 mL Per Tube Q8H   Continuous Infusions:  feeding supplement (OSMOLITE 1.5 CAL) 1,000 mL (02/16/24 0920)   meropenem  (MERREM ) IV 1,000 mg (02/17/24 0610)   micafungin  (MYCAMINE ) 100 mg in sodium chloride  0.9 % 100 mL IVPB 100 mg (02/16/24 0920)   Labs Reviewed: BUN 28 CBG ranges from 114-195 mg/dL over the last 24 hours HgbA1c 5.3  NUTRITION - FOCUSED PHYSICAL EXAM:  Flowsheet Row Most Recent Value  Orbital Region Severe depletion  Upper Arm Region Moderate depletion  Thoracic and Lumbar Region Severe depletion   Buccal Region Moderate depletion  Temple Region Severe depletion  Clavicle Bone Region Severe depletion  Clavicle and Acromion Bone Region Severe depletion  Scapular Bone Region Severe depletion  Dorsal Hand Severe depletion  Patellar Region Severe depletion  Anterior Thigh Region Severe depletion  Posterior Calf Region Severe depletion  Edema (RD Assessment) None  Hair Reviewed  Eyes Reviewed  Mouth Unable to assess  Skin Reviewed  Nails Reviewed    Diet Order:   Diet Order             Diet NPO time specified  Diet effective now                   EDUCATION NEEDS:   Education needs have been addressed  Skin:  Skin Assessment: Skin Integrity Issues: Skin Integrity Issues:: Incisions, Other (Comment) Incisions: 9/3 abdomen, 9/4 rt chest Other: right back wound, left buttocks wound  Last BM:  10/5-125 ml x 24 hours via FMS  Height:   Ht Readings from Last 1 Encounters:  01/16/24 5' 11 (1.803 m)    Weight:   Wt Readings from Last 1 Encounters:  02/17/24 74.2 kg    Ideal Body Weight:  78.2 kg  BMI:  Body mass index is 22.81 kg/m.  Estimated Nutritional Needs:   Kcal:  2300-2500 kcal  Protein:  120-140 gm  Fluid:  >/=2L/day   Olivia Kenning, RD Registered Dietitian  See Amion for more information

## 2024-02-18 ENCOUNTER — Other Ambulatory Visit: Payer: Self-pay

## 2024-02-18 LAB — GLUCOSE, CAPILLARY
Glucose-Capillary: 150 mg/dL — ABNORMAL HIGH (ref 70–99)
Glucose-Capillary: 166 mg/dL — ABNORMAL HIGH (ref 70–99)
Glucose-Capillary: 163 mg/dL — ABNORMAL HIGH (ref 70–99)
Glucose-Capillary: 175 mg/dL — ABNORMAL HIGH (ref 70–99)
Glucose-Capillary: 155 mg/dL — ABNORMAL HIGH (ref 70–99)

## 2024-02-18 MED ORDER — CLONIDINE HCL 0.1 MG/24HR TD PTWK
0.1000 mg | MEDICATED_PATCH | TRANSDERMAL | Status: DC
  Filled 2024-02-18: qty 1

## 2024-02-18 NOTE — Plan of Care (Signed)
 Problem: Education: Goal: Ability to describe self-care measures that may prevent or decrease complications (Diabetes Survival Skills Education) will improve 02/18/2024 0005 by Marvis Kunio SAILOR, RN Outcome: Progressing 02/17/2024 2010 by Marvis Lain SAILOR, RN Outcome: Progressing Goal: Individualized Educational Video(s) 02/18/2024 0005 by Marvis Reginold SAILOR, RN Outcome: Progressing 02/17/2024 2010 by Marvis Triton SAILOR, RN Outcome: Progressing   Problem: Coping: Goal: Ability to adjust to condition or change in health will improve 02/18/2024 0005 by Marvis Reakwon SAILOR, RN Outcome: Progressing 02/17/2024 2010 by Marvis Bralynn SAILOR, RN Outcome: Progressing   Problem: Fluid Volume: Goal: Ability to maintain a balanced intake and output will improve 02/18/2024 0005 by Marvis Librado SAILOR, RN Outcome: Progressing 02/17/2024 2010 by Marvis Ladarion SAILOR, RN Outcome: Progressing   Problem: Health Behavior/Discharge Planning: Goal: Ability to identify and utilize available resources and services will improve 02/18/2024 0005 by Marvis Karin SAILOR, RN Outcome: Progressing 02/17/2024 2010 by Marvis Barkley SAILOR, RN Outcome: Progressing Goal: Ability to manage health-related needs will improve 02/18/2024 0005 by Marvis Sherrick SAILOR, RN Outcome: Progressing 02/17/2024 2010 by Marvis Devlyn SAILOR, RN Outcome: Progressing   Problem: Metabolic: Goal: Ability to maintain appropriate glucose levels will improve 02/18/2024 0005 by Marvis Elad SAILOR, RN Outcome: Progressing 02/17/2024 2010 by Marvis Shizuo SAILOR, RN Outcome: Progressing   Problem: Nutritional: Goal: Maintenance of adequate nutrition will improve 02/18/2024 0005 by Marvis Caron SAILOR, RN Outcome: Progressing 02/17/2024 2010 by Marvis Cal SAILOR, RN Outcome: Progressing Goal: Progress toward achieving an optimal weight will improve 02/18/2024 0005 by Marvis Ernest SAILOR, RN Outcome: Progressing 02/17/2024 2010 by Marvis Rand SAILOR,  RN Outcome: Progressing   Problem: Skin Integrity: Goal: Risk for impaired skin integrity will decrease 02/18/2024 0005 by Marvis Shyheem SAILOR, RN Outcome: Progressing 02/17/2024 2010 by Marvis Clevland SAILOR, RN Outcome: Progressing   Problem: Tissue Perfusion: Goal: Adequacy of tissue perfusion will improve 02/18/2024 0005 by Marvis Javarus SAILOR, RN Outcome: Progressing 02/17/2024 2010 by Marvis Ramil SAILOR, RN Outcome: Progressing   Problem: Education: Goal: Knowledge of General Education information will improve Description: Including pain rating scale, medication(s)/side effects and non-pharmacologic comfort measures 02/18/2024 0005 by Marvis Shadee SAILOR, RN Outcome: Progressing 02/17/2024 2010 by Marvis Lavontay SAILOR, RN Outcome: Progressing   Problem: Clinical Measurements: Goal: Will remain free from infection 02/18/2024 0005 by Marvis Zahir SAILOR, RN Outcome: Progressing 02/17/2024 2010 by Marvis Adrean SAILOR, RN Outcome: Progressing Goal: Respiratory complications will improve 02/18/2024 0005 by Marvis Gabrial SAILOR, RN Outcome: Progressing 02/17/2024 2010 by Marvis Nicholis SAILOR, RN Outcome: Progressing   Problem: Activity: Goal: Risk for activity intolerance will decrease 02/18/2024 0005 by Marvis Calen SAILOR, RN Outcome: Progressing 02/17/2024 2010 by Marvis Dauntae SAILOR, RN Outcome: Progressing   Problem: Pain Managment: Goal: General experience of comfort will improve and/or be controlled 02/18/2024 0005 by Marvis Sayge SAILOR, RN Outcome: Progressing 02/17/2024 2010 by Marvis Aldous SAILOR, RN Outcome: Progressing   Problem: Safety: Goal: Ability to remain free from injury will improve 02/18/2024 0005 by Marvis Yuto SAILOR, RN Outcome: Progressing 02/17/2024 2010 by Marvis Xzavier SAILOR, RN Outcome: Progressing   Problem: Safety: Goal: Non-violent Restraint(s) 02/18/2024 0005 by Marvis Dent SAILOR, RN Outcome: Progressing 02/17/2024 2010 by Marvis Ferris SAILOR, RN Outcome:  Progressing   Problem: Activity: Goal: Ability to tolerate increased activity will improve 02/18/2024 0005 by Marvis Karem SAILOR, RN Outcome: Progressing 02/17/2024 2010 by Marvis Khyle SAILOR, RN Outcome: Progressing   Problem: Respiratory: Goal: Ability to maintain a clear airway and adequate ventilation will improve 02/18/2024 0005 by  Marvis Midas SAILOR, RN Outcome: Progressing 02/17/2024 2010 by Marvis Melinda SAILOR, RN Outcome: Progressing   Problem: Role Relationship: Goal: Method of communication will improve 02/18/2024 0005 by Marvis Ranard SAILOR, RN Outcome: Progressing 02/17/2024 2010 by Marvis Niki SAILOR, RN Outcome: Progressing

## 2024-02-18 NOTE — Plan of Care (Signed)
  Problem: Education: Goal: Individualized Educational Video(s) Outcome: Progressing   Problem: Metabolic: Goal: Ability to maintain appropriate glucose levels will improve Outcome: Progressing   Problem: Nutritional: Goal: Maintenance of adequate nutrition will improve Outcome: Progressing Goal: Progress toward achieving an optimal weight will improve Outcome: Progressing   Problem: Tissue Perfusion: Goal: Adequacy of tissue perfusion will improve Outcome: Progressing   Problem: Clinical Measurements: Goal: Will remain free from infection Outcome: Progressing Goal: Respiratory complications will improve Outcome: Progressing   Problem: Pain Managment: Goal: General experience of comfort will improve and/or be controlled Outcome: Progressing   Problem: Safety: Goal: Ability to remain free from injury will improve Outcome: Progressing   Problem: Safety: Goal: Non-violent Restraint(s) Outcome: Progressing   Problem: Activity: Goal: Ability to tolerate increased activity will improve Outcome: Progressing   Problem: Respiratory: Goal: Ability to maintain a clear airway and adequate ventilation will improve Outcome: Progressing   Problem: Role Relationship: Goal: Method of communication will improve Outcome: Progressing

## 2024-02-18 NOTE — Progress Notes (Signed)
 12 Days Post-Op   Subjective/Chief Complaint: Patient continues to desire to eat.     Objective: Vital signs in last 24 hours: Temp:  [97.7 F (36.5 C)-98.8 F (37.1 C)] 98.8 F (37.1 C) (10/07 0802) Pulse Rate:  [94-113] 100 (10/07 0802) Resp:  [15-30] 15 (10/07 0802) BP: (115-153)/(73-95) 153/91 (10/07 0802) SpO2:  [93 %-100 %] 100 % (10/07 0802) Weight:  [74.2 kg] 74.2 kg (10/07 0500) Last BM Date : 02/17/24  Intake/Output from previous day: 10/06 0701 - 10/07 0700 In: 1795.2 [I.V.:10; NG/GT:1185; IV Piggyback:600.2] Out: 1855 [Urine:1200; Drains:155; Stool:500] Intake/Output this shift: No intake/output data recorded.  General appearance: sleeping, interactive with nursing earlier.   Resp: breathing comfortably Cardio: regular rate and rhythm GI: soft, nontender. Tube feeds at goal. Drain output murky  Lab Results:  Recent Labs    02/16/24 0508  WBC 20.9*  HGB 10.1*  HCT 31.3*  PLT 628*   BMET Recent Labs    02/16/24 0508  NA 142  K 3.7  CL 106  CO2 28  GLUCOSE 158*  BUN 28*  CREATININE 0.69  CALCIUM  8.8*   PT/INR No results for input(s): LABPROT, INR in the last 72 hours. ABG No results for input(s): PHART, HCO3 in the last 72 hours.  Invalid input(s): PCO2, PO2  Studies/Results: No results found.  Anti-infectives: Anti-infectives (From admission, onward)    Start     Dose/Rate Route Frequency Ordered Stop   02/07/24 2000  vancomycin  (VANCOCIN ) IVPB 1000 mg/200 mL premix  Status:  Discontinued        1,000 mg 200 mL/hr over 60 Minutes Intravenous Every 12 hours 02/07/24 1229 02/08/24 0725   02/07/24 0915  vancomycin  (VANCOREADY) IVPB 1500 mg/300 mL        1,500 mg 150 mL/hr over 120 Minutes Intravenous  Once 02/07/24 0823 02/07/24 1125   02/02/24 0400  micafungin  (MYCAMINE ) 100 mg in sodium chloride  0.9 % 100 mL IVPB        100 mg 105 mL/hr over 1 Hours Intravenous Daily 02/02/24 0248 03/15/24 1400   02/02/24 0400  meropenem   (MERREM ) 1,000 mg in sodium chloride  0.9 % 100 mL IVPB        1,000 mg 200 mL/hr over 30 Minutes Intravenous Every 8 hours 02/02/24 0248 03/15/24 1400   02/02/24 0315  vancomycin  (VANCOCIN ) IVPB 1000 mg/200 mL premix        1,000 mg 200 mL/hr over 60 Minutes Intravenous  Once 02/02/24 0228 02/02/24 0510   01/31/24 1800  piperacillin -tazobactam (ZOSYN ) IVPB 3.375 g  Status:  Discontinued        3.375 g 12.5 mL/hr over 240 Minutes Intravenous Every 8 hours 01/31/24 1639 02/02/24 0248   01/31/24 1745  fluconazole  (DIFLUCAN ) IVPB 400 mg  Status:  Discontinued        400 mg 100 mL/hr over 120 Minutes Intravenous Every 24 hours 01/31/24 1659 02/02/24 0248   01/22/24 1000  fluconazole  (DIFLUCAN ) IVPB 400 mg       Placed in Followed by Linked Group   400 mg 100 mL/hr over 120 Minutes Intravenous Every 24 hours 01/21/24 0956 01/30/24 1134   01/21/24 1045  fluconazole  (DIFLUCAN ) IVPB 800 mg       Placed in Followed by Linked Group   800 mg 200 mL/hr over 120 Minutes Intravenous  Once 01/21/24 0956 01/21/24 1441   01/21/24 0945  piperacillin -tazobactam (ZOSYN ) IVPB 3.375 g        3.375 g 12.5 mL/hr over 240 Minutes  Intravenous Every 8 hours 01/21/24 0920 01/30/24 2216   01/16/24 1510  ceFAZolin  (ANCEF ) 2-4 GM/100ML-% IVPB       Note to Pharmacy: Cindie Pizza: cabinet override      01/16/24 1510 01/17/24 0314   01/15/24 0645  ceFAZolin  (ANCEF ) IVPB 2g/100 mL premix       Placed in And Linked Group   2 g 200 mL/hr over 30 Minutes Intravenous On call to O.R. 01/15/24 0641 01/15/24 1700   01/15/24 0645  metroNIDAZOLE  (FLAGYL ) IVPB 500 mg       Placed in And Linked Group   500 mg 100 mL/hr over 60 Minutes Intravenous On call to O.R. 01/15/24 9358 01/15/24 0910   01/15/24 9357  ceFAZolin  (ANCEF ) IVPB 2g/100 mL premix  Status:  Discontinued        2 g 200 mL/hr over 30 Minutes Intravenous 30 min pre-op 01/15/24 0642 01/15/24 0644       Assessment/Plan: s/p Procedure(s) with  comments: EGD (ESOPHAGOGASTRODUODENOSCOPY) (N/A) - will need C arm Continue tube feeds Continue drains [name] is a 68 yo male with gastric cardia adenocarcinoma, s/p total gastrectomy, with distal esophagectomy and roux-en-Y esophagojejunostomy on 9/3. S/p takeback by thoracic surgery for RATS repair of anastomotic leak on 9/4. S/p EGD with placement of esophageal stent on 9/11. S/p EGD with repositioning of esophageal stent 9/25.  - Has had respiratory arrest x2 associated with hypercarbia. Extubated 9/27, now on RA - ICU delirium: this has improved, Clonidine  patch. Delirium precautions.  - Pain control: scheduled tylenol , prn oxycodone  and fentanyl . - Hypertension: home metoprolol  and hydrochlorothiazide. On clonidine  patch for sedation and hypertension. Will wean clonidine  patch to 0.1 mg  - EJ leak: S/p esophageal stent placement. Pleural JP to remain in place. On antibiotics and antifungal coverage per thoracic, will need 6 weeks total.  - FEN: Continue J tube feeds at goal. Patient is tolerating and having bowel function. FWF for hypernatremia - improved  - ID: On broad-spectrum abx and antifungal coverage (Merrem  and Micafungin ) for treatment of aspiration pneumonia and EJ leak. To receive a 6-week course of abx per thoracic. WBC trending down on last check and afeb. Will discuss with thoracic how long prior to repeating upper GI.  - A-fib: remains in sinus rhythm. On therapeutic lovenox . - Code status: back to full code per patient wishes - Dispo: 4NP, Klonopin at HS, PT recommending SNF at discharge, patient wishes to go home. Can also consider CIR if he makes significant improvement in activity tolerance.  Will get psych consult for assistance with discharge planning.    LOS: 34 days   [name] Nephew, MD, FACS, FSSO Surgical Oncology, General Surgery, Trauma and Critical [facility], [place] [phone] for weekday/non holidays Check [email or web address] for coverage  night/weekend/holidays

## 2024-02-18 NOTE — Progress Notes (Signed)
 Occupational Therapy Treatment Patient Details Name: [name] MRN: [id number] DOB: 02/26/1956 Today's Date: 02/18/2024   History of present illness 69 y.o. male presents to [facility] hospital on 01/15/2024 with gastric CA. Pt underwent total gastrectomy with omentectomy and D2 lymphadenectomy, esophagojejunostomy with Jtube placement, splenic biopsy on 9/3. Pt developed bilious output into chest tube on 9/4, returned to OR for EGD and thoracoscopy with application of wound matrix to EJ anastomosis. ETT 9/11-9/12 for desats and unresponsiveness. S/p EGD and esophageal stent placement 9/11. Cardiac arrest 9/19. Re-ett 9/19-9/22. 9/26 PEA respiratory failure with intubation extubation 9/27 PMH includes asthma, OA, COPD, GERD, DMII, MDD, emphysema.   OT comments  Pt is making limited progress towards their acute OT goals. Pt was easily awakened on arrival but lethargic throughout the session. He needed maximal encouragement and multimodal cues to initiate, sequence and sustain simple tasks. He stood 3x with mod A and RW, but unable to stay standing or successfully take side steps. OT to continue to follow acutely to facilitate progress towards established goals. Pt will continue to benefit from skilled inpatient follow up therapy, <3 hours/day.       If plan is discharge home, recommend the following:  A lot of help with walking and/or transfers;Two people to help with walking and/or transfers;A lot of help with bathing/dressing/bathroom;Two people to help with bathing/dressing/bathroom;Assistance with cooking/housework;Assist for transportation;Help with stairs or ramp for entrance   Equipment Recommendations  Other (comment)       Precautions / Restrictions Precautions Precautions: Fall Recall of Precautions/Restrictions: Impaired Precaution/Restrictions Comments: R JP drain x 1, J tube, R port Restrictions Weight Bearing Restrictions Per Provider Order: No       Mobility Bed Mobility Overal  bed mobility: Needs Assistance Bed Mobility: Rolling, Sidelying to Sit, Sit to Supine Rolling: Mod assist Sidelying to sit: Max assist     Sit to sidelying: Mod assist General bed mobility comments: maximal cues and assist needed    Transfers Overall transfer level: Needs assistance Equipment used: Rolling walker (2 wheels) Transfers: Sit to/from Stand Sit to Stand: Mod assist           General transfer comment: mod A to stand 3x, max A to attempt side stepping     Balance Overall balance assessment: Needs assistance Sitting-balance support: Bilateral upper extremity supported, Feet supported Sitting balance-Leahy Scale: Fair     Standing balance support: Bilateral upper extremity supported, During functional activity, Reliant on assistive device for balance Standing balance-Leahy Scale: Poor                             ADL either performed or assessed with clinical judgement   ADL Overall ADL's : Needs assistance/impaired                         Toilet Transfer: Maximal assistance;Stand-pivot Toilet Transfer Details (indicate cue type and reason): mod A to stand, only sustained standing for 3 seconds prior to impulsively sitting         Functional mobility during ADLs: Maximal assistance General ADL Comments: self-limiting, unable to progress away from the EOB    Extremity/Trunk Assessment Upper Extremity Assessment Upper Extremity Assessment: Generalized weakness;Difficult to assess due to impaired cognition   Lower Extremity Assessment Lower Extremity Assessment: Defer to PT evaluation        Vision   Vision Assessment?: Wears glasses for reading   Perception Perception Perception: Not  tested   Praxis Praxis Praxis: Not tested   Communication Communication Communication: Impaired Factors Affecting Communication: Reduced clarity of speech   Cognition Arousal: Alert Behavior During Therapy: Flat affect Cognition: No  family/caregiver present to determine baseline   Orientation impairments: Situation Awareness: Online awareness impaired Memory impairment (select all impairments): Short-term memory, Working memory Attention impairment (select first level of impairment): Sustained attention Executive functioning impairment (select all impairments): Reasoning, Problem solving OT - Cognition Comments: needed maximal encouragement and cues. Did not initiate, sequence or sustain simple tasks.                 Following commands: Impaired Following commands impaired: Follows one step commands inconsistently      Cueing   Cueing Techniques: Verbal cues, Gestural cues, Tactile cues, Visual cues  Exercises      Shoulder Instructions       General Comments VSS    Pertinent Vitals/ Pain       Pain Assessment Pain Assessment: Faces Faces Pain Scale: Hurts little more Pain Location: chest Pain Descriptors / Indicators: Grimacing, Guarding, Discomfort Pain Intervention(s): Limited activity within patient's tolerance, Monitored during session  Home Living                                          Prior Functioning/Environment              Frequency  Min 2X/week        Progress Toward Goals  OT Goals(current goals can now be found in the care plan section)  Progress towards OT goals: Not progressing toward goals - comment  Acute Rehab OT Goals Patient Stated Goal: to sleep OT Goal Formulation: With patient Time For Goal Achievement: 02/24/24 Potential to Achieve Goals: Fair ADL Goals Pt Will Perform Grooming: with min assist;standing Pt Will Perform Lower Body Dressing: with min assist;sit to/from stand Pt Will Transfer to Toilet: with min assist;ambulating  Plan      Co-evaluation                 AM-PAC OT 6 Clicks Daily Activity     Outcome Measure   Help from another person eating meals?: A Little Help from another person taking care of  personal grooming?: A Little Help from another person toileting, which includes using toliet, bedpan, or urinal?: A Lot Help from another person bathing (including washing, rinsing, drying)?: A Lot Help from another person to put on and taking off regular upper body clothing?: A Lot Help from another person to put on and taking off regular lower body clothing?: A Lot 6 Click Score: 14    End of Session Equipment Utilized During Treatment: Gait belt;Rolling walker (2 wheels);Oxygen  OT Visit Diagnosis: Unsteadiness on feet (R26.81);Muscle weakness (generalized) (M62.81);Other symptoms and signs involving cognitive function;Pain   Activity Tolerance Patient tolerated treatment well   Patient Left in chair;with call bell/phone within reach;with chair alarm set   Nurse Communication Mobility status;Precautions        Time: 8556-8497 OT Time Calculation (min): 19 min  Charges: OT General Charges $OT Visit: 1 Visit OT Treatments $Therapeutic Activity: 8-22 mins  Lucie Kendall, OTR/L Acute Rehabilitation Services Office [phone] Secure Chat Communication Preferred   Lucie JONETTA Kendall 02/18/2024, 3:48 PM

## 2024-02-18 NOTE — Consult Note (Signed)
 Psychiatry Consult Note  Reason for consult: competence for medical decision making and also for pre SNF eval Patient currently not safe for home discharge but wants to go home. Consult By: Jina Nephew, MD  Assessment and Plan [name] is a 68 year old male w/ hx of HTN, COPD, asthma, t2dm, hypercholesterolemia, MDD, afib on eliquis , GERD, and recent gastric cardia adenocarcinoma presenting for diagnostic laparoscopy with iagnostic laparoscopy with open total gastrectomy/Roux-en-Y esophagojejunostomy with feeding J tube placement. Hospitalization complicated by esophageal leak, respiratory failure, ICU delirium and PEA. He was briefly DNR but possibly not DNI 9/21-10/1 before returning to full code.   Upon assessment today, patient immediately became agitated and minimally participated in evaluation possibly due to confusion. While complete assessment was not done, at time of evaluation, patient lacked medical decisional capacity regarding disposition as evidenced by his lack of orientation (thought he was in bedroom at brother's house) and evasive responses to questions regarding disposition, his understanding of his own medical conditions, and risks/benefits to his choices. Recommend speaking with HCPOA regarding any dispositional planning and possibly consult palliative care to clarify GOC.   Recommendations: -Consider switching from clonazepam to quetiapine  to reduce risk for delirium -delirium precautions -Consider consulting palliative for GOC discussion -Consider speaking with HCPOA to clarify code status and disposition planning  We will sign off at this time.  Capacity Evaluation In an evaluation of capacity, each of the following criteria must be met based on medical necessity in order for a patient to have capacity to make the decision in question. Of note, the capacity evaluation assesses only for the specified decision documented above and is not a determination of the  patient's overall competency, which can only be adjudicated.  Criterion 1: The patient DOES NOT demonstrate a clear and consistent voluntary choice with regard to treatment options  Criterion 2: The patient DOES NOT understands (to know the meaning of the information given) the disease they have, the treatment proposed, the risks of treatment, and the risks of other treatment (including no treatment).  Criterion 3: The patient DOES NOT appreciate the details of Criterion 2 apply to them specifically and the likely consequences of treatment options proposed.  Criterion 4: The patient DOES NOT demonstrates adequate reasoning/rationality within the context of their decision and can provide justification for their choice.   Subjective Patient was not informed psychiatry would be evaluating him. He was agitated and remained so throughout brief assessment. He was evasive when it came to answering questions as to why he was admitted. When asked why he wanted to leave the hospital, he adamantly denied stating this and stated whoever told this writer this was full of shit. When asked how he got to the hospital, he said do you think I did?. Asked for clarification but this agitated the patient more. Patient demanded this writer get out of his room. We respected his wishes and left.   Vitals:   02/18/24 0802 02/18/24 1113  BP: (!) 153/91 (!) 142/85  Pulse: 100 91  Resp: 15 18  Temp: 98.8 F (37.1 C) 98.8 F (37.1 C)  SpO2: 100% 100%    02/18/24 1327  Presentation  General Appearance Appropriate for Environment  Eye Contact Fair  Speech Normal Rate (at times hard to understand)  Speech Volume Increased  Mood and Affect  Mood Angry  Affect Appropriate;Congruent  Thought Processes  Thought Process Coherent;Goal Directed;Linear  Descriptions of Associations Loose  Orientation Partial  Thought Content Scattered  Hallucinations None  Ideas of Reference None  Suicidal Thoughts  (UTA)   Homicidal Thoughts  (UTA)  Sensorium  Memory Remote Poor  Judgment Impaired  Insight Poor  Chartered certified accountant Fair  Attention Span Fair  Recall Poor  Fund of Knowledge Fair  Language Fair  Psychomotor Activity  Psychomotor Activity Normal  Sleep  Sleep Fair   Prentice Espy, MD Psychiatry, [phone]

## 2024-02-19 LAB — CBC
HCT: 32.2 % — ABNORMAL LOW (ref 39.0–52.0)
Hemoglobin: 10.3 g/dL — ABNORMAL LOW (ref 13.0–17.0)
MCH: 29.4 pg (ref 26.0–34.0)
MCHC: 32 g/dL (ref 30.0–36.0)
MCV: 92 fL (ref 80.0–100.0)
Platelets: 668 K/uL — ABNORMAL HIGH (ref 150–400)
RBC: 3.5 MIL/uL — ABNORMAL LOW (ref 4.22–5.81)
RDW: 15.5 % (ref 11.5–15.5)
WBC: 18.5 K/uL — ABNORMAL HIGH (ref 4.0–10.5)
nRBC: 0 % (ref 0.0–0.2)

## 2024-02-19 LAB — GLUCOSE, CAPILLARY
Glucose-Capillary: 169 mg/dL — ABNORMAL HIGH (ref 70–99)
Glucose-Capillary: 169 mg/dL — ABNORMAL HIGH (ref 70–99)
Glucose-Capillary: 183 mg/dL — ABNORMAL HIGH (ref 70–99)
Glucose-Capillary: 126 mg/dL — ABNORMAL HIGH (ref 70–99)

## 2024-02-19 LAB — BASIC METABOLIC PANEL WITH GFR
Anion gap: 10 (ref 5–15)
BUN: 30 mg/dL — ABNORMAL HIGH (ref 8–23)
CO2: 27 mmol/L (ref 22–32)
Calcium: 8.7 mg/dL — ABNORMAL LOW (ref 8.9–10.3)
Chloride: 97 mmol/L — ABNORMAL LOW (ref 98–111)
Creatinine, Ser: 0.56 mg/dL — ABNORMAL LOW (ref 0.61–1.24)
GFR, Estimated: >60 mL/min (ref >60)
Glucose, Bld: 176 mg/dL — ABNORMAL HIGH (ref 70–99)
Potassium: 3.9 mmol/L (ref 3.5–5.1)
Sodium: 134 mmol/L — ABNORMAL LOW (ref 135–145)

## 2024-02-19 MED ORDER — FREE WATER
100.0000 mL | Freq: Three times a day (TID) | Status: DC
Start: 2024-02-19 — End: 2024-02-24
  Administered 2024-02-19 – 2024-02-24 (×15): 100 mL

## 2024-02-19 NOTE — Progress Notes (Signed)
 Physical Therapy Treatment Patient Details Name: [name] MRN: [id number] DOB: November 27, 1955 Today's Date: 02/19/2024   History of Present Illness 69 y.o. male presents to [facility] hospital on 01/15/2024 with gastric CA. Pt underwent total gastrectomy with omentectomy and D2 lymphadenectomy, esophagojejunostomy with Jtube placement, splenic biopsy on 9/3. Pt developed bilious output into chest tube on 9/4, returned to OR for EGD and thoracoscopy with application of wound matrix to EJ anastomosis. ETT 9/11-9/12 for desats and unresponsiveness. S/p EGD and esophageal stent placement 9/11. Cardiac arrest 9/19. Re-ett 9/19-9/22. 9/26 PEA respiratory failure with intubation extubation 9/27 PMH includes asthma, OA, COPD, GERD, DMII, MDD, emphysema.    PT Comments  Pt with poor tolerance for mobility at this time, requiring encouragement to transfer out of bed and then declining ambulation due to fatigue. Pt continues to require physical assistance for all functional mobility tasks due to weakness and imbalance. Patient will benefit from continued inpatient follow up therapy, <3 hours/day.    If plan is discharge home, recommend the following: A lot of help with walking and/or transfers;A lot of help with bathing/dressing/bathroom;Direct supervision/assist for medications management;Assistance with feeding;Help with stairs or ramp for entrance;Supervision due to cognitive status   Can travel by private vehicle        Equipment Recommendations  Wheelchair (measurements PT);Wheelchair cushion (measurements PT);BSC/3in1;Rolling walker (2 wheels)    Recommendations for Other Services       Precautions / Restrictions Precautions Precautions: Fall Recall of Precautions/Restrictions: Impaired Precaution/Restrictions Comments: R JP drain x 1, J tube, R port Restrictions Weight Bearing Restrictions Per Provider Order: No     Mobility  Bed Mobility Overal bed mobility: Needs Assistance Bed Mobility:  Rolling, Sidelying to Sit Rolling: Mod assist Sidelying to sit: Mod assist, Used rails, HOB elevated            Transfers Overall transfer level: Needs assistance Equipment used: 1 person hand held assist Transfers: Sit to/from Stand, Bed to chair/wheelchair/BSC Sit to Stand: Min assist Stand pivot transfers: Min assist              Ambulation/Gait Ambulation/Gait assistance:  (pt declines ambulation attempts, appearing to report fatigue)                 Stairs             Wheelchair Mobility     Tilt Bed    Modified Rankin (Stroke Patients Only)       Balance Overall balance assessment: Needs assistance Sitting-balance support: No upper extremity supported, Feet supported Sitting balance-Leahy Scale: Fair     Standing balance support: Single extremity supported, Reliant on assistive device for balance Standing balance-Leahy Scale: Poor                              Communication Communication Communication: Impaired Factors Affecting Communication: Reduced clarity of speech  Cognition Arousal: Alert Behavior During Therapy: Flat affect   PT - Cognitive impairments: Difficult to assess Difficult to assess due to: Impaired communication                     PT - Cognition Comments: pt is very difficult to understand, requires encouragement for participation in therapy. slowed processing Following commands: Impaired Following commands impaired: Follows one step commands with increased time    Cueing Cueing Techniques: Verbal cues  Exercises      General Comments General comments (skin integrity, edema, etc.): VSS  on RA. Pt sounds very congested, some sonorous breathing noted      Pertinent Vitals/Pain Pain Assessment Pain Assessment: PAINAD Breathing: occasional labored breathing, short period of hyperventilation Negative Vocalization: none Facial Expression: sad, frightened, frown Body Language:  relaxed Consolability: no need to console PAINAD Score: 2 Pain Location: abdomen Pain Descriptors / Indicators: Grimacing Pain Intervention(s): Monitored during session    Home Living                          Prior Function            PT Goals (current goals can now be found in the care plan section) Acute Rehab PT Goals Patient Stated Goal: to return to independence Progress towards PT goals: Not progressing toward goals - comment (limited by fatigue)    Frequency    Min 2X/week      PT Plan      Co-evaluation              AM-PAC PT 6 Clicks Mobility   Outcome Measure  Help needed turning from your back to your side while in a flat bed without using bedrails?: A Lot Help needed moving from lying on your back to sitting on the side of a flat bed without using bedrails?: A Lot Help needed moving to and from a bed to a chair (including a wheelchair)?: A Little Help needed standing up from a chair using your arms (e.g., wheelchair or bedside chair)?: A Little Help needed to walk in hospital room?: Total Help needed climbing 3-5 steps with a railing? : Total 6 Click Score: 12    End of Session Equipment Utilized During Treatment: Gait belt Activity Tolerance: Patient limited by fatigue Patient left: in chair;with call bell/phone within reach;with chair alarm set Nurse Communication: Mobility status PT Visit Diagnosis: Other abnormalities of gait and mobility (R26.89);Muscle weakness (generalized) (M62.81);Pain     Time: 1040-1103 PT Time Calculation (min) (ACUTE ONLY): 23 min  Charges:    $Therapeutic Activity: 23-37 mins PT General Charges $$ ACUTE PT VISIT: 1 Visit                     Bernardino JINNY Ruth, PT, DPT Acute Rehabilitation Office [phone]    Bernardino JINNY Ruth 02/19/2024, 1:19 PM

## 2024-02-19 NOTE — Progress Notes (Signed)
 13 Days Post-Op   Subjective/Chief Complaint: Psych saw yesterday. Patient not competent to make decision for self discharge as expected.    Objective: Vital signs in last 24 hours: Temp:  [98.2 F (36.8 C)-99.2 F (37.3 C)] 98.2 F (36.8 C) (10/08 0747) Pulse Rate:  [91-110] 104 (10/08 0747) Resp:  [18-27] 27 (10/08 0747) BP: (123-151)/(68-98) 128/88 (10/08 0747) SpO2:  [96 %-100 %] 96 % (10/08 0747) Weight:  [71.8 kg] 71.8 kg (10/08 0324) Last BM Date : 02/19/24  Intake/Output from previous day: 10/07 0701 - 10/08 0700 In: 600 [NG/GT:600] Out: 1540 [Urine:1100; Drains:240; Stool:200] Intake/Output this shift: No intake/output data recorded.  General appearance: alert, not oriented.  Resp: breathing comfortably Cardio: regular rate and rhythm GI: soft, nontender. Tube feeds at goal. Drain output brown, thick.   Lab Results:  Recent Labs    02/19/24 0600  WBC 18.5*  HGB 10.3*  HCT 32.2*  PLT 668*   BMET Recent Labs    02/19/24 0600  NA 134*  K 3.9  CL 97*  CO2 27  GLUCOSE 176*  BUN 30*  CREATININE 0.56*  CALCIUM  8.7*   PT/INR No results for input(s): LABPROT, INR in the last 72 hours. ABG No results for input(s): PHART, HCO3 in the last 72 hours.  Invalid input(s): PCO2, PO2  Studies/Results: No results found.  Anti-infectives: Anti-infectives (From admission, onward)    Start     Dose/Rate Route Frequency Ordered Stop   02/07/24 2000  vancomycin  (VANCOCIN ) IVPB 1000 mg/200 mL premix  Status:  Discontinued        1,000 mg 200 mL/hr over 60 Minutes Intravenous Every 12 hours 02/07/24 1229 02/08/24 0725   02/07/24 0915  vancomycin  (VANCOREADY) IVPB 1500 mg/300 mL        1,500 mg 150 mL/hr over 120 Minutes Intravenous  Once 02/07/24 0823 02/07/24 1125   02/02/24 0400  micafungin  (MYCAMINE ) 100 mg in sodium chloride  0.9 % 100 mL IVPB        100 mg 105 mL/hr over 1 Hours Intravenous Daily 02/02/24 0248 03/15/24 1400   02/02/24 0400   meropenem  (MERREM ) 1,000 mg in sodium chloride  0.9 % 100 mL IVPB        1,000 mg 200 mL/hr over 30 Minutes Intravenous Every 8 hours 02/02/24 0248 03/15/24 1400   02/02/24 0315  vancomycin  (VANCOCIN ) IVPB 1000 mg/200 mL premix        1,000 mg 200 mL/hr over 60 Minutes Intravenous  Once 02/02/24 0228 02/02/24 0510   01/31/24 1800  piperacillin -tazobactam (ZOSYN ) IVPB 3.375 g  Status:  Discontinued        3.375 g 12.5 mL/hr over 240 Minutes Intravenous Every 8 hours 01/31/24 1639 02/02/24 0248   01/31/24 1745  fluconazole  (DIFLUCAN ) IVPB 400 mg  Status:  Discontinued        400 mg 100 mL/hr over 120 Minutes Intravenous Every 24 hours 01/31/24 1659 02/02/24 0248   01/22/24 1000  fluconazole  (DIFLUCAN ) IVPB 400 mg       Placed in Followed by Linked Group   400 mg 100 mL/hr over 120 Minutes Intravenous Every 24 hours 01/21/24 0956 01/30/24 1134   01/21/24 1045  fluconazole  (DIFLUCAN ) IVPB 800 mg       Placed in Followed by Linked Group   800 mg 200 mL/hr over 120 Minutes Intravenous  Once 01/21/24 0956 01/21/24 1441   01/21/24 0945  piperacillin -tazobactam (ZOSYN ) IVPB 3.375 g        3.375 g 12.5 mL/hr  over 240 Minutes Intravenous Every 8 hours 01/21/24 0920 01/30/24 2216   01/16/24 1510  ceFAZolin  (ANCEF ) 2-4 GM/100ML-% IVPB       Note to Pharmacy: Cindie Pizza: cabinet override      01/16/24 1510 01/17/24 0314   01/15/24 0645  ceFAZolin  (ANCEF ) IVPB 2g/100 mL premix       Placed in And Linked Group   2 g 200 mL/hr over 30 Minutes Intravenous On call to O.R. 01/15/24 0641 01/15/24 1700   01/15/24 0645  metroNIDAZOLE  (FLAGYL ) IVPB 500 mg       Placed in And Linked Group   500 mg 100 mL/hr over 60 Minutes Intravenous On call to O.R. 01/15/24 9358 01/15/24 0910   01/15/24 9357  ceFAZolin  (ANCEF ) IVPB 2g/100 mL premix  Status:  Discontinued        2 g 200 mL/hr over 30 Minutes Intravenous 30 min pre-op 01/15/24 0642 01/15/24 0644       Assessment/Plan: s/p Procedure(s)  with comments: EGD (ESOPHAGOGASTRODUODENOSCOPY) (N/A) - will need C arm Continue tube feeds Continue drains [name] is a 68 yo male with gastric cardia adenocarcinoma, s/p total gastrectomy, with distal esophagectomy and roux-en-Y esophagojejunostomy on 9/3. S/p takeback by thoracic surgery for RATS repair of anastomotic leak on 9/4. S/p EGD with placement of esophageal stent on 9/11. S/p EGD with repositioning of esophageal stent 9/25.  - Has had respiratory arrest x2 associated with hypercarbia. Extubated 9/27, now on RA - ICU delirium: this has improved, Clonidine  patch. Delirium precautions.  - can't switch to quetiapine  given no elixir.  Don't want to clog J tube.    - Pain control: scheduled tylenol , prn oxycodone  and fentanyl . - Hypertension: home metoprolol  and hydrochlorothiazide. On clonidine  patch for sedation and hypertension. Clonidine  patch to 0.1 mg  - EJ leak: S/p esophageal stent placement. Pleural JP to remain in place. On antibiotics and antifungal coverage per thoracic, will need 6 weeks total.   - FEN: Continue J tube feeds at goal. Patient is tolerating and having bowel function. FWF for hypernatremia - improved  - ID: On broad-spectrum abx and antifungal coverage (Merrem  and Micafungin ) for treatment of aspiration pneumonia and EJ leak. To receive a 6-week course of abx per thoracic. WBC trending down on last check and afeb. Will discuss with thoracic how long prior to repeating upper GI. Still with foul drain output . - A-fib: remains in sinus rhythm. On therapeutic lovenox . - Code status: back to full code per patient wishes - Dispo: 4NP, Klonopin at HS, PT recommending SNF at discharge, SW working on placement. Unlikely CIR given patient delirium unless we see improvement.      LOS: 35 days   Jon LITTIE Nephew, [name], FACS, FSSO Surgical Oncology, General Surgery, Trauma and Critical [facility], [place] [phone] for weekday/non holidays Check  [email or web address] for coverage night/weekend/holidays

## 2024-02-20 LAB — GLUCOSE, CAPILLARY
Glucose-Capillary: 123 mg/dL — ABNORMAL HIGH (ref 70–99)
Glucose-Capillary: 133 mg/dL — ABNORMAL HIGH (ref 70–99)
Glucose-Capillary: 157 mg/dL — ABNORMAL HIGH (ref 70–99)
Glucose-Capillary: 116 mg/dL — ABNORMAL HIGH (ref 70–99)

## 2024-02-20 MED ORDER — FENTANYL CITRATE (PF) 50 MCG/ML IJ SOSY
12.5000 ug | PREFILLED_SYRINGE | INTRAMUSCULAR | Status: DC | PRN
  Administered 2024-02-21 – 2024-02-24 (×6): 50 ug via INTRAVENOUS
  Filled 2024-02-20 (×6): qty 1

## 2024-02-20 NOTE — Progress Notes (Signed)
 14 Days Post-Op   Subjective/Chief Complaint: No new issues.     Objective: Vital signs in last 24 hours: Temp:  [97.5 F (36.4 C)-98.5 F (36.9 C)] 97.5 F (36.4 C) (10/09 1132) Pulse Rate:  [87-106] 88 (10/09 1132) Resp:  [20-25] 22 (10/09 1132) BP: (107-140)/(67-100) 118/68 (10/09 1132) SpO2:  [96 %-100 %] 100 % (10/09 1132) FiO2 (%):  [21 %] 21 % (10/08 1952) Last BM Date : 02/20/24  Intake/Output from previous day: 10/08 0701 - 10/09 0700 In: 300 [NG/GT:300] Out: 1940 [Urine:1200; Drains:190; Stool:550] Intake/Output this shift: Total I/O In: 100 [NG/GT:100] Out: -   General appearance: sleeping.  Resp: breathing comfortably Cardio: regular rate and rhythm GI: soft, nontender. Tube feeds at goal. Drain output brown, thick.  Ext:  warm, well perfused.    Lab Results:  Recent Labs    02/19/24 0600  WBC 18.5*  HGB 10.3*  HCT 32.2*  PLT 668*   BMET Recent Labs    02/19/24 0600  NA 134*  K 3.9  CL 97*  CO2 27  GLUCOSE 176*  BUN 30*  CREATININE 0.56*  CALCIUM  8.7*   PT/INR No results for input(s): LABPROT, INR in the last 72 hours. ABG No results for input(s): PHART, HCO3 in the last 72 hours.  Invalid input(s): PCO2, PO2  Studies/Results: No results found.  Anti-infectives: Anti-infectives (From admission, onward)    Start     Dose/Rate Route Frequency Ordered Stop   02/07/24 2000  vancomycin  (VANCOCIN ) IVPB 1000 mg/200 mL premix  Status:  Discontinued        1,000 mg 200 mL/hr over 60 Minutes Intravenous Every 12 hours 02/07/24 1229 02/08/24 0725   02/07/24 0915  vancomycin  (VANCOREADY) IVPB 1500 mg/300 mL        1,500 mg 150 mL/hr over 120 Minutes Intravenous  Once 02/07/24 0823 02/07/24 1125   02/02/24 0400  micafungin  (MYCAMINE ) 100 mg in sodium chloride  0.9 % 100 mL IVPB        100 mg 105 mL/hr over 1 Hours Intravenous Daily 02/02/24 0248 03/15/24 1400   02/02/24 0400  meropenem  (MERREM ) 1,000 mg in sodium chloride  0.9 %  100 mL IVPB        1,000 mg 200 mL/hr over 30 Minutes Intravenous Every 8 hours 02/02/24 0248 03/15/24 1400   02/02/24 0315  vancomycin  (VANCOCIN ) IVPB 1000 mg/200 mL premix        1,000 mg 200 mL/hr over 60 Minutes Intravenous  Once 02/02/24 0228 02/02/24 0510   01/31/24 1800  piperacillin -tazobactam (ZOSYN ) IVPB 3.375 g  Status:  Discontinued        3.375 g 12.5 mL/hr over 240 Minutes Intravenous Every 8 hours 01/31/24 1639 02/02/24 0248   01/31/24 1745  fluconazole  (DIFLUCAN ) IVPB 400 mg  Status:  Discontinued        400 mg 100 mL/hr over 120 Minutes Intravenous Every 24 hours 01/31/24 1659 02/02/24 0248   01/22/24 1000  fluconazole  (DIFLUCAN ) IVPB 400 mg       Placed in Followed by Linked Group   400 mg 100 mL/hr over 120 Minutes Intravenous Every 24 hours 01/21/24 0956 01/30/24 1134   01/21/24 1045  fluconazole  (DIFLUCAN ) IVPB 800 mg       Placed in Followed by Linked Group   800 mg 200 mL/hr over 120 Minutes Intravenous  Once 01/21/24 0956 01/21/24 1441   01/21/24 0945  piperacillin -tazobactam (ZOSYN ) IVPB 3.375 g        3.375 g 12.5 mL/hr  over 240 Minutes Intravenous Every 8 hours 01/21/24 0920 01/30/24 2216   01/16/24 1510  ceFAZolin  (ANCEF ) 2-4 GM/100ML-% IVPB       Note to Pharmacy: Cindie Pizza: cabinet override      01/16/24 1510 01/17/24 0314   01/15/24 0645  ceFAZolin  (ANCEF ) IVPB 2g/100 mL premix       Placed in And Linked Group   2 g 200 mL/hr over 30 Minutes Intravenous On call to O.R. 01/15/24 0641 01/15/24 1700   01/15/24 0645  metroNIDAZOLE  (FLAGYL ) IVPB 500 mg       Placed in And Linked Group   500 mg 100 mL/hr over 60 Minutes Intravenous On call to O.R. 01/15/24 9358 01/15/24 0910   01/15/24 9357  ceFAZolin  (ANCEF ) IVPB 2g/100 mL premix  Status:  Discontinued        2 g 200 mL/hr over 30 Minutes Intravenous 30 min pre-op 01/15/24 0642 01/15/24 0644       Assessment/Plan: s/p Procedure(s) with comments: EGD (ESOPHAGOGASTRODUODENOSCOPY) (N/A) -  will need C arm Continue tube feeds Continue drains Mr. Sparano is a 68 yo male with gastric cardia adenocarcinoma, s/p total gastrectomy, with distal esophagectomy and roux-en-Y esophagojejunostomy on 9/3. S/p takeback by thoracic surgery for RATS repair of anastomotic leak on 9/4. S/p EGD with placement of esophageal stent on 9/11. S/p EGD with repositioning of esophageal stent 9/25.  - Has had respiratory arrest x2 associated with hypercarbia. Extubated 9/27, now on RA - ICU delirium: this has improved, Clonidine  patch. Delirium precautions.  - consider risperdal  ODT and wean clonazapam.   - Pain control: scheduled tylenol , prn oxycodone  and fentanyl . - Hypertension: home metoprolol  and hydrochlorothiazide. On clonidine  patch for sedation and hypertension. Clonidine  patch to 0.1 mg  - EJ leak: S/p esophageal stent placement. Pleural JP to remain in place. On antibiotics and antifungal coverage per thoracic, will need 6 weeks total.  Thoracic potentially to replace stent in a few weeks.    - FEN: Continue J tube feeds at goal. Patient is tolerating and having bowel function. FWF for hypernatremia - improved, going down on free water .   - ID: On broad-spectrum abx and antifungal coverage (Merrem  and Micafungin ) for treatment of aspiration pneumonia and EJ leak. To receive a 6-week course of abx.  WBC trending down on last check and afeb. Still with foul drain output . - A-fib: remains in sinus rhythm. On therapeutic lovenox . - Code status: back to full code per patient wishes - Dispo: 4NP, Klonopin at HS, PT recommending SNF at discharge, SW working on placement. Unlikely CIR given patient delirium unless we see improvement.      LOS: 36 days   Jina LITTIE Nephew, MD, FACS, FSSO Surgical Oncology, General Surgery, Trauma and Critical [facility], [place] [phone] for weekday/non holidays Check [email or web address] for coverage night/weekend/holidays

## 2024-02-20 NOTE — TOC Progression Note (Signed)
 Transition of Care [facility]) - Progression Note    Patient Details  Name: [name] MRN: [id number] Date of Birth: 11-Sep-1955  Transition of [facility] CM/SW Contact  Whitfield Campanile, Student-Social Work Phone Number: 02/20/2024, 2:30 PM  Clinical Narrative:     TOC continuing to follow and assist with discharge planning as patient starts to become medically stable.  Whitfield Campanile, MSW Intern  Expected Discharge Plan: Skilled Nursing Facility Barriers to Discharge: Continued Medical Work up, English as a second language teacher, SNF Pending bed offer               Expected Discharge Plan and Services     Post Acute Care Choice: Skilled Nursing Facility Living arrangements for the past 2 months: Single Family Home                                       Social Drivers of Health (SDOH) Interventions SDOH Screenings   Food Insecurity: No Food Insecurity (01/28/2024)  Housing: Low Risk  (01/28/2024)  Transportation Needs: No Transportation Needs (01/28/2024)  Utilities: Not At Risk (01/28/2024)  Depression (PHQ2-9): Low Risk  (11/25/2023)  Financial Resource Strain: Low Risk  (08/20/2023)  Social Connections: Unknown (01/28/2024)  Stress: No Stress Concern Present (08/20/2023)  Tobacco Use: High Risk (01/28/2024)    Readmission Risk Interventions    11/05/2023   12:23 PM  Readmission Risk Prevention Plan  Transportation Screening Complete  PCP or Specialist Appt within 3-5 Days Complete  HRI or Home Care Consult Complete  Social Work Consult for Recovery Care Planning/Counseling Complete  Palliative Care Screening Complete  Medication Review Oceanographer) Complete

## 2024-02-21 LAB — CBC
HCT: 29.5 % — ABNORMAL LOW (ref 39.0–52.0)
Hemoglobin: 9.3 g/dL — ABNORMAL LOW (ref 13.0–17.0)
MCH: 28.4 pg (ref 26.0–34.0)
MCHC: 31.5 g/dL (ref 30.0–36.0)
MCV: 89.9 fL (ref 80.0–100.0)
Platelets: 586 K/uL — ABNORMAL HIGH (ref 150–400)
RBC: 3.28 MIL/uL — ABNORMAL LOW (ref 4.22–5.81)
RDW: 15.6 % — ABNORMAL HIGH (ref 11.5–15.5)
WBC: 11.8 K/uL — ABNORMAL HIGH (ref 4.0–10.5)
nRBC: 0 % (ref 0.0–0.2)

## 2024-02-21 LAB — GLUCOSE, CAPILLARY
Glucose-Capillary: 156 mg/dL — ABNORMAL HIGH (ref 70–99)
Glucose-Capillary: 131 mg/dL — ABNORMAL HIGH (ref 70–99)
Glucose-Capillary: 134 mg/dL — ABNORMAL HIGH (ref 70–99)
Glucose-Capillary: 117 mg/dL — ABNORMAL HIGH (ref 70–99)

## 2024-02-21 LAB — BASIC METABOLIC PANEL WITH GFR
Anion gap: 11 (ref 5–15)
BUN: 35 mg/dL — ABNORMAL HIGH (ref 8–23)
CO2: 27 mmol/L (ref 22–32)
Calcium: 8.7 mg/dL — ABNORMAL LOW (ref 8.9–10.3)
Chloride: 96 mmol/L — ABNORMAL LOW (ref 98–111)
Creatinine, Ser: 0.68 mg/dL (ref 0.61–1.24)
GFR, Estimated: >60 mL/min (ref >60)
Glucose, Bld: 155 mg/dL — ABNORMAL HIGH (ref 70–99)
Potassium: 4.2 mmol/L (ref 3.5–5.1)
Sodium: 134 mmol/L — ABNORMAL LOW (ref 135–145)

## 2024-02-21 MED ORDER — RISPERIDONE 0.5 MG PO TBDP
0.5000 mg | ORAL_TABLET | Freq: Every day | ORAL | Status: DC
  Administered 2024-02-21 – 2024-02-23 (×3): 0.5 mg via SUBLINGUAL
  Filled 2024-02-21 (×3): qty 1

## 2024-02-21 NOTE — Progress Notes (Signed)
 Physical Therapy Treatment Patient Details Name: [name] MRN: [id number] DOB: [birth year] Today's Date: 02/21/2024   History of Present Illness 68 y.o. male presents to [facility] hospital on 01/15/2024 with gastric CA. Pt underwent total gastrectomy with omentectomy and D2 lymphadenectomy, esophagojejunostomy with Jtube placement, splenic biopsy on 9/3. Pt developed bilious output into chest tube on 9/4, returned to OR for EGD and thoracoscopy with application of wound matrix to EJ anastomosis. ETT 9/11-9/12 for desats and unresponsiveness. S/p EGD and esophageal stent placement 9/11. Cardiac arrest 9/19. Re-ett 9/19-9/22. 9/26 PEA respiratory failure with intubation extubation 9/27 PMH includes asthma, OA, COPD, GERD, DMII, MDD, emphysema.    PT Comments  Pt tolerates treatment well, more alert and with improved endurance  compared to last PT visit. Pt requires less physical assistance for transfers, although still needing verbal cues for hand placement to improve safety and efficiency. Pt remains at a high risk for falls due to impaired cognition, strength and endurance. Patient will benefit from continued inpatient follow up therapy, <3 hours/day.    If plan is discharge home, recommend the following: A lot of help with walking and/or transfers;A lot of help with bathing/dressing/bathroom;Assistance with cooking/housework;Direct supervision/assist for medications management;Direct supervision/assist for financial management;Assist for transportation;Help with stairs or ramp for entrance   Can travel by private vehicle     No  Equipment Recommendations  Wheelchair (measurements PT);Wheelchair cushion (measurements PT);BSC/3in1;Rolling walker (2 wheels)    Recommendations for Other Services       Precautions / Restrictions Precautions Precautions: Fall Recall of Precautions/Restrictions: Impaired Precaution/Restrictions Comments: R JP drain x 1, J tube, R port Restrictions Weight Bearing  Restrictions Per Provider Order: No     Mobility  Bed Mobility Overal bed mobility: Needs Assistance Bed Mobility: Rolling, Sidelying to Sit Rolling: Contact guard assist Sidelying to sit: Min assist, HOB elevated, Used rails            Transfers Overall transfer level: Needs assistance Equipment used: Rolling walker (2 wheels) Transfers: Sit to/from Stand Sit to Stand: Contact guard assist   Step pivot transfers: Contact guard assist       General transfer comment: verbal cues for hand placement when utilizing RW    Ambulation/Gait Ambulation/Gait assistance: Min assist Gait Distance (Feet): 30 Feet (additional trial of 20') Assistive device: Rolling walker (2 wheels) Gait Pattern/deviations: Step-through pattern, Staggering right, Staggering left Gait velocity: reduced Gait velocity interpretation: <1.31 ft/sec, indicative of household ambulator   General Gait Details: pt with slowed step-through gait, reduced stride length. Pt with staggering laterall with fatigue near completion of 2nd bout of ambulation   Stairs             Wheelchair Mobility     Tilt Bed    Modified Rankin (Stroke Patients Only)       Balance Overall balance assessment: Needs assistance Sitting-balance support: No upper extremity supported, Feet supported Sitting balance-Leahy Scale: Fair     Standing balance support: Bilateral upper extremity supported, Reliant on assistive device for balance Standing balance-Leahy Scale: Poor                              Communication Communication Communication: Impaired Factors Affecting Communication: Reduced clarity of speech  Cognition Arousal: Alert Behavior During Therapy: WFL for tasks assessed/performed   PT - Cognitive impairments: Orientation, Awareness, Memory, Problem solving, Safety/Judgement Difficult to assess due to: Impaired communication Orientation impairments: Place, Time  Following commands: Intact      Cueing Cueing Techniques: Verbal cues  Exercises      General Comments General comments (skin integrity, edema, etc.): VSS onRA      Pertinent Vitals/Pain Pain Assessment Pain Assessment: Faces Faces Pain Scale: Hurts even more Pain Location: generalized Pain Descriptors / Indicators: Sore Pain Intervention(s): Monitored during session    Home Living                          Prior Function            PT Goals (current goals can now be found in the care plan section) Acute Rehab PT Goals Patient Stated Goal: to return to independence Progress towards PT goals: Progressing toward goals    Frequency    Min 2X/week      PT Plan      Co-evaluation              AM-PAC PT 6 Clicks Mobility   Outcome Measure  Help needed turning from your back to your side while in a flat bed without using bedrails?: A Little Help needed moving from lying on your back to sitting on the side of a flat bed without using bedrails?: A Little Help needed moving to and from a bed to a chair (including a wheelchair)?: A Little Help needed standing up from a chair using your arms (e.g., wheelchair or bedside chair)?: A Little Help needed to walk in hospital room?: A Little Help needed climbing 3-5 steps with a railing? : Total 6 Click Score: 16    End of Session   Activity Tolerance: Patient tolerated treatment well Patient left: in chair;with call bell/phone within reach;with chair alarm set Nurse Communication: Mobility status PT Visit Diagnosis: Other abnormalities of gait and mobility (R26.89);Muscle weakness (generalized) (M62.81);Pain     Time: 1342-1405 PT Time Calculation (min) (ACUTE ONLY): 23 min  Charges:    $Gait Training: 8-22 mins $Therapeutic Activity: 8-22 mins PT General Charges $$ ACUTE PT VISIT: 1 Visit                     Bernardino JINNY Ruth, PT, DPT Acute Rehabilitation Office [phone]    Bernardino JINNY Ruth 02/21/2024, 2:16 PM

## 2024-02-21 NOTE — Progress Notes (Signed)
 Patient ID: [name], male   DOB: Oct 18, 1955, 68 y.o.   MRN: [id number] 15 Days Post-Op    Subjective: Doing ok ROS negative except as listed above. Objective: Vital signs in last 24 hours: Temp:  [97.5 F (36.4 C)-98.8 F (37.1 C)] 97.9 F (36.6 C) (10/10 0304) Pulse Rate:  [83-97] 87 (10/10 0304) Resp:  [19-25] 19 (10/10 0304) BP: (108-135)/(68-92) 135/90 (10/10 0304) SpO2:  [98 %-100 %] 98 % (10/10 0304) FiO2 (%):  [21 %] 21 % (10/09 2033) Last BM Date : 02/20/24  Intake/Output from previous day: 10/09 0701 - 10/10 0700 In: 300 [NG/GT:300] Out: 520 [Urine:400; Drains:120] Intake/Output this shift: No intake/output data recorded.  General appearance: alert and cooperative Resp: clear to auscultation bilaterally GI: soft, NT, J tube working  Lab Results: CBC  Recent Labs    02/19/24 0600 02/21/24 0528  WBC 18.5* 11.8*  HGB 10.3* 9.3*  HCT 32.2* 29.5*  PLT 668* 586*   BMET Recent Labs    02/19/24 0600 02/21/24 0528  NA 134* 134*  K 3.9 4.2  CL 97* 96*  CO2 27 27  GLUCOSE 176* 155*  BUN 30* 35*  CREATININE 0.56* 0.68  CALCIUM  8.7* 8.7*   PT/INR No results for input(s): LABPROT, INR in the last 72 hours. ABG No results for input(s): PHART, HCO3 in the last 72 hours.  Invalid input(s): PCO2, PO2  Studies/Results: No results found.  Anti-infectives: Anti-infectives (From admission, onward)    Start     Dose/Rate Route Frequency Ordered Stop   02/07/24 2000  vancomycin  (VANCOCIN ) IVPB 1000 mg/200 mL premix  Status:  Discontinued        1,000 mg 200 mL/hr over 60 Minutes Intravenous Every 12 hours 02/07/24 1229 02/08/24 0725   02/07/24 0915  vancomycin  (VANCOREADY) IVPB 1500 mg/300 mL        1,500 mg 150 mL/hr over 120 Minutes Intravenous  Once 02/07/24 0823 02/07/24 1125   02/02/24 0400  micafungin  (MYCAMINE ) 100 mg in sodium chloride  0.9 % 100 mL IVPB        100 mg 105 mL/hr over 1 Hours Intravenous Daily 02/02/24 0248 03/15/24  1400   02/02/24 0400  meropenem  (MERREM ) 1,000 mg in sodium chloride  0.9 % 100 mL IVPB        1,000 mg 200 mL/hr over 30 Minutes Intravenous Every 8 hours 02/02/24 0248 03/15/24 1400   02/02/24 0315  vancomycin  (VANCOCIN ) IVPB 1000 mg/200 mL premix        1,000 mg 200 mL/hr over 60 Minutes Intravenous  Once 02/02/24 0228 02/02/24 0510   01/31/24 1800  piperacillin -tazobactam (ZOSYN ) IVPB 3.375 g  Status:  Discontinued        3.375 g 12.5 mL/hr over 240 Minutes Intravenous Every 8 hours 01/31/24 1639 02/02/24 0248   01/31/24 1745  fluconazole  (DIFLUCAN ) IVPB 400 mg  Status:  Discontinued        400 mg 100 mL/hr over 120 Minutes Intravenous Every 24 hours 01/31/24 1659 02/02/24 0248   01/22/24 1000  fluconazole  (DIFLUCAN ) IVPB 400 mg       Placed in Followed by Linked Group   400 mg 100 mL/hr over 120 Minutes Intravenous Every 24 hours 01/21/24 0956 01/30/24 1134   01/21/24 1045  fluconazole  (DIFLUCAN ) IVPB 800 mg       Placed in Followed by Linked Group   800 mg 200 mL/hr over 120 Minutes Intravenous  Once 01/21/24 0956 01/21/24 1441   01/21/24 0945  piperacillin -tazobactam (ZOSYN ) IVPB  3.375 g        3.375 g 12.5 mL/hr over 240 Minutes Intravenous Every 8 hours 01/21/24 0920 01/30/24 2216   01/16/24 1510  ceFAZolin  (ANCEF ) 2-4 GM/100ML-% IVPB       Note to Pharmacy: Cindie Pizza: cabinet override      01/16/24 1510 01/17/24 0314   01/15/24 0645  ceFAZolin  (ANCEF ) IVPB 2g/100 mL premix       Placed in And Linked Group   2 g 200 mL/hr over 30 Minutes Intravenous On call to O.R. 01/15/24 0641 01/15/24 1700   01/15/24 0645  metroNIDAZOLE  (FLAGYL ) IVPB 500 mg       Placed in And Linked Group   500 mg 100 mL/hr over 60 Minutes Intravenous On call to O.R. 01/15/24 9358 01/15/24 0910   01/15/24 9357  ceFAZolin  (ANCEF ) IVPB 2g/100 mL premix  Status:  Discontinued        2 g 200 mL/hr over 30 Minutes Intravenous 30 min pre-op 01/15/24 9357 01/15/24 0644        Assessment/Plan: [name] is a 68 yo male with gastric cardia adenocarcinoma, s/p total gastrectomy, with distal esophagectomy and roux-en-Y esophagojejunostomy on 9/3. S/p takeback by thoracic surgery for RATS repair of anastomotic leak on 9/4. S/p EGD with placement of esophageal stent on 9/11. S/p EGD with repositioning of esophageal stent 9/25.  - Has had respiratory arrest x2 associated with hypercarbia. Extubated 9/27, now on RA - ICU delirium: this has improved, Clonidine  patch. Delirium precautions.  - consider risperdal  ODT and wean clonazapam.   - Pain control: scheduled tylenol , prn oxycodone  and fentanyl . - Hypertension: home metoprolol  and hydrochlorothiazide. On clonidine  patch for sedation and hypertension. Clonidine  patch to 0.1 mg  - EJ leak: S/p esophageal stent placement. Pleural JP to remain in place. On antibiotics and antifungal coverage per thoracic, will need 6 weeks total.  Thoracic potentially to replace stent in a few weeks.    - FEN: Continue J tube feeds at goal. Patient is tolerating and having bowel function. FWF for hypernatremia - improved, going down on free water .   - ID: On broad-spectrum abx and antifungal coverage (Merrem  and Micafungin ) for treatment of aspiration pneumonia and EJ leak. Pharmacy looking at changing to enteral ABX today (OK with TCTS), follow WBC (down to 11.8) - A-fib: remains in sinus rhythm. On therapeutic lovenox . - Code status: back to full code per patient wishes - Dispo: 4NP, Klonopin at HS, PT recommending SNF at discharge, SW working on placement.    LOS: 37 days    Jon Hummer, MD, MPH, FACS Trauma & General Surgery Use [email or web address] to contact on call provider  02/21/2024

## 2024-02-21 NOTE — Plan of Care (Signed)
  Problem: Education: Goal: Individualized Educational Video(s) 02/21/2024 1815 by Evander Arley SQUIBB, RN Outcome: Progressing 02/21/2024 1814 by Evander Arley SQUIBB, RN Outcome: Progressing   Problem: Fluid Volume: Goal: Ability to maintain a balanced intake and output will improve 02/21/2024 1815 by Evander Arley SQUIBB, RN Outcome: Progressing 02/21/2024 1814 by Evander Arley SQUIBB, RN Outcome: Progressing   Problem: Health Behavior/Discharge Planning: Goal: Ability to identify and utilize available resources and services will improve 02/21/2024 1815 by Evander Arley SQUIBB, RN Outcome: Progressing 02/21/2024 1814 by Evander Arley SQUIBB, RN Outcome: Progressing Goal: Ability to manage health-related needs will improve 02/21/2024 1815 by Evander Arley SQUIBB, RN Outcome: Progressing 02/21/2024 1814 by Evander Arley SQUIBB, RN Outcome: Progressing   Problem: Metabolic: Goal: Ability to maintain appropriate glucose levels will improve 02/21/2024 1815 by Evander Arley SQUIBB, RN Outcome: Progressing 02/21/2024 1814 by Evander Arley SQUIBB, RN Outcome: Progressing   Problem: Nutritional: Goal: Maintenance of adequate nutrition will improve 02/21/2024 1815 by Evander Arley SQUIBB, RN Outcome: Progressing 02/21/2024 1814 by Evander Arley SQUIBB, RN Outcome: Progressing Goal: Progress toward achieving an optimal weight will improve 02/21/2024 1815 by Evander Arley SQUIBB, RN Outcome: Progressing 02/21/2024 1814 by Evander Arley SQUIBB, RN Outcome: Progressing   Problem: Skin Integrity: Goal: Risk for impaired skin integrity will decrease 02/21/2024 1815 by Evander Arley SQUIBB, RN Outcome: Progressing 02/21/2024 1814 by Evander Arley SQUIBB, RN Outcome: Progressing   Problem: Tissue Perfusion: Goal: Adequacy of tissue perfusion will improve 02/21/2024 1815 by Evander Arley SQUIBB, RN Outcome: Progressing 02/21/2024 1814 by Evander Arley SQUIBB, RN Outcome: Progressing   Problem: Clinical Measurements: Goal: Will remain free  from infection 02/21/2024 1815 by Evander Arley SQUIBB, RN Outcome: Progressing 02/21/2024 1814 by Evander Arley SQUIBB, RN Outcome: Progressing Goal: Respiratory complications will improve 02/21/2024 1815 by Evander Arley SQUIBB, RN Outcome: Progressing 02/21/2024 1814 by Evander Arley SQUIBB, RN Outcome: Progressing   Problem: Activity: Goal: Risk for activity intolerance will decrease 02/21/2024 1815 by Evander Arley SQUIBB, RN Outcome: Progressing 02/21/2024 1814 by Evander Arley SQUIBB, RN Outcome: Progressing   Problem: Pain Managment: Goal: General experience of comfort will improve and/or be controlled 02/21/2024 1815 by Evander Arley SQUIBB, RN Outcome: Progressing 02/21/2024 1814 by Evander Arley SQUIBB, RN Outcome: Progressing   Problem: Safety: Goal: Ability to remain free from injury will improve 02/21/2024 1815 by Evander Arley SQUIBB, RN Outcome: Progressing 02/21/2024 1814 by Evander Arley SQUIBB, RN Outcome: Progressing   Problem: Safety: Goal: Non-violent Restraint(s) 02/21/2024 1815 by Evander Arley SQUIBB, RN Outcome: Progressing 02/21/2024 1814 by Evander Arley SQUIBB, RN Outcome: Progressing   Problem: Activity: Goal: Ability to tolerate increased activity will improve 02/21/2024 1815 by Evander Arley SQUIBB, RN Outcome: Progressing 02/21/2024 1814 by Evander Arley SQUIBB, RN Outcome: Progressing   Problem: Respiratory: Goal: Ability to maintain a clear airway and adequate ventilation will improve 02/21/2024 1815 by Evander Arley SQUIBB, RN Outcome: Progressing 02/21/2024 1814 by Evander Arley SQUIBB, RN Outcome: Progressing   Problem: Role Relationship: Goal: Method of communication will improve 02/21/2024 1815 by Evander Arley SQUIBB, RN Outcome: Progressing 02/21/2024 1814 by Evander Arley SQUIBB, RN Outcome: Progressing

## 2024-02-22 LAB — GLUCOSE, CAPILLARY
Glucose-Capillary: 148 mg/dL — ABNORMAL HIGH (ref 70–99)
Glucose-Capillary: 142 mg/dL — ABNORMAL HIGH (ref 70–99)
Glucose-Capillary: 149 mg/dL — ABNORMAL HIGH (ref 70–99)
Glucose-Capillary: 131 mg/dL — ABNORMAL HIGH (ref 70–99)

## 2024-02-22 LAB — CBC
HCT: 30 % — ABNORMAL LOW (ref 39.0–52.0)
Hemoglobin: 9.6 g/dL — ABNORMAL LOW (ref 13.0–17.0)
MCH: 29.1 pg (ref 26.0–34.0)
MCHC: 32 g/dL (ref 30.0–36.0)
MCV: 90.9 fL (ref 80.0–100.0)
Platelets: 589 K/uL — ABNORMAL HIGH (ref 150–400)
RBC: 3.3 MIL/uL — ABNORMAL LOW (ref 4.22–5.81)
RDW: 15.5 % (ref 11.5–15.5)
WBC: 11.8 K/uL — ABNORMAL HIGH (ref 4.0–10.5)
nRBC: 0 % (ref 0.0–0.2)

## 2024-02-22 NOTE — Progress Notes (Signed)
 Occupational Therapy Treatment Patient Details Name: [name] MRN: [id number] DOB: Jan 11, 1956 Today's Date: 02/22/2024   History of present illness 68 y.o. male presents to [facility] hospital on 01/15/2024 with gastric CA. Pt underwent total gastrectomy with omentectomy and D2 lymphadenectomy, esophagojejunostomy with Jtube placement, splenic biopsy on 9/3. Pt developed bilious output into chest tube on 9/4, returned to OR for EGD and thoracoscopy with application of wound matrix to EJ anastomosis. ETT 9/11-9/12 for desats and unresponsiveness. S/p EGD and esophageal stent placement 9/11. Cardiac arrest 9/19. Re-ett 9/19-9/22. 9/26 PEA respiratory failure with intubation extubation 9/27 PMH includes asthma, OA, COPD, GERD, DMII, MDD, emphysema.   OT comments  Pt. Seen for skilled OT treatment session.  Very limited this day secondary to pt. Very lethargic, eyes tightly closed.  Multiple attempts with various stimuli for pt. Engagement.  Pt. Opening eyes briefly then squeezing them shut.  MAX A for hand over hand support for grooming task of face washing.  Pt. Dropping the wash cloth then eventually swatting the therapist asst.s hands away.  Pt left with call bell in reach, bed alarm set.  Rn notified of pt.s presentation and therapeutic attempts.  Will cont. With acute OT POC with hopes of progressing participation and ADLs next session.        If plan is discharge home, recommend the following:  A lot of help with walking and/or transfers;Two people to help with walking and/or transfers;A lot of help with bathing/dressing/bathroom;Two people to help with bathing/dressing/bathroom;Assistance with cooking/housework;Assist for transportation;Help with stairs or ramp for entrance   Equipment Recommendations       Recommendations for Other Services      Precautions / Restrictions Precautions Precautions: Fall Recall of Precautions/Restrictions: Impaired Precaution/Restrictions Comments: R JP drain x  1, J tube, R port       Mobility Bed Mobility               General bed mobility comments: total A for repositioning with ue of bed functions and bed pads in attempts to awaken pt., pt. remained eyes closed sleeping    Transfers                         Balance                                           ADL either performed or assessed with clinical judgement   ADL Overall ADL's : Needs assistance/impaired     Grooming: Wash/dry hands;Maximal assistance;Bed level Grooming Details (indicate cue type and reason): placed washcloth in pts. hand. supported hand A to bring washcloth to face, pt. dropping the washcloth and not making moves to pick it back up, attempted washing face, pt. swatting therapist asst. hands away from his face but keeping his eyes closed                               General ADL Comments: tx session limited secondary to pt. very lethargic and not opening his eyes for active participation despite multiple attempts and various stimuli    Extremity/Trunk Assessment              Vision       Perception     Praxis     Communication     Cognition Arousal:  Lethargic                                            Cueing      Exercises      Shoulder Instructions       General Comments  Eyes briefly open, asking pt. To verify birth date and he closed eyes and did not answer     Pertinent Vitals/ Pain       Pain Assessment Pain Assessment: No/denies pain  Home Living                                          Prior Functioning/Environment              Frequency  Min 2X/week        Progress Toward Goals  OT Goals(current goals can now be found in the care plan section)  Progress towards OT goals: Progressing toward goals     Plan      Co-evaluation                 AM-PAC OT 6 Clicks Daily Activity     Outcome Measure   Help from  another person eating meals?: A Little Help from another person taking care of personal grooming?: A Little Help from another person toileting, which includes using toliet, bedpan, or urinal?: A Lot Help from another person bathing (including washing, rinsing, drying)?: A Lot Help from another person to put on and taking off regular upper body clothing?: A Lot Help from another person to put on and taking off regular lower body clothing?: A Lot 6 Click Score: 14    End of Session    OT Visit Diagnosis: Unsteadiness on feet (R26.81);Muscle weakness (generalized) (M62.81);Other symptoms and signs involving cognitive function;Pain   Activity Tolerance Patient limited by lethargy   Patient Left in bed;with call bell/phone within reach;with bed alarm set   Nurse Communication Other (comment) (rn states ok to work with pt., updated rn after session of pts. presentation)        Time: 8951-8942 OT Time Calculation (min): 9 min  Charges: OT General Charges $OT Visit: 1 Visit OT Treatments $Self Care/Home Management : 8-22 mins  Randall, COTA/L Acute Rehabilitation [phone]   CHRISTELLA Nest Lorraine-COTA/L  02/22/2024, 11:18 AM

## 2024-02-22 NOTE — Plan of Care (Signed)
  Problem: Education: Goal: Ability to describe self-care measures that may prevent or decrease complications (Diabetes Survival Skills Education) will improve Outcome: Progressing Goal: Individualized Educational Video(s) Outcome: Progressing   Problem: Coping: Goal: Ability to adjust to condition or change in health will improve Outcome: Progressing   Problem: Fluid Volume: Goal: Ability to maintain a balanced intake and output will improve Outcome: Progressing   Problem: Health Behavior/Discharge Planning: Goal: Ability to identify and utilize available resources and services will improve Outcome: Progressing Goal: Ability to manage health-related needs will improve Outcome: Progressing   Problem: Metabolic: Goal: Ability to maintain appropriate glucose levels will improve Outcome: Progressing   Problem: Nutritional: Goal: Maintenance of adequate nutrition will improve Outcome: Progressing Goal: Progress toward achieving an optimal weight will improve Outcome: Progressing   Problem: Skin Integrity: Goal: Risk for impaired skin integrity will decrease Outcome: Progressing   Problem: Tissue Perfusion: Goal: Adequacy of tissue perfusion will improve Outcome: Progressing   Problem: Education: Goal: Knowledge of General Education information will improve Description: Including pain rating scale, medication(s)/side effects and non-pharmacologic comfort measures Outcome: Progressing   Problem: Clinical Measurements: Goal: Will remain free from infection Outcome: Progressing Goal: Respiratory complications will improve Outcome: Progressing   Problem: Activity: Goal: Risk for activity intolerance will decrease Outcome: Progressing   Problem: Pain Managment: Goal: General experience of comfort will improve and/or be controlled Outcome: Progressing   Problem: Safety: Goal: Ability to remain free from injury will improve Outcome: Progressing   Problem:  Safety: Goal: Non-violent Restraint(s) Outcome: Progressing   Problem: Activity: Goal: Ability to tolerate increased activity will improve Outcome: Progressing   Problem: Respiratory: Goal: Ability to maintain a clear airway and adequate ventilation will improve Outcome: Progressing   Problem: Role Relationship: Goal: Method of communication will improve Outcome: Progressing

## 2024-02-22 NOTE — Progress Notes (Signed)
 Patient ID: [name], male   DOB: March 21, 1956, 68 y.o.   MRN: [id number] 16 Days Post-Op    Subjective: Denies new complaints. WBC 12 and stable.  ROS negative except as listed above. Objective: Vital signs in last 24 hours: Temp:  [97.9 F (36.6 C)-98.6 F (37 C)] 98.1 F (36.7 C) (10/11 0800) Pulse Rate:  [75-94] 86 (10/11 0800) Resp:  [16-23] 20 (10/11 0800) BP: (107-140)/(70-87) 140/84 (10/11 0800) SpO2:  [96 %-100 %] 96 % (10/11 0800) FiO2 (%):  [21 %] 21 % (10/10 2023) Weight:  [72 kg] 72 kg (10/11 0500) Last BM Date : 02/21/24  Intake/Output from previous day: 10/10 0701 - 10/11 0700 In: 1107.3 [I.V.:10; NG/GT:1097.3] Out: 2025 [Urine:1650; Drains:75; Stool:300] Intake/Output this shift: No intake/output data recorded.  General appearance: alert and cooperative Resp: clear to auscultation bilaterally GI: soft, NT, J tube working, JP drain with murky output  Lab Results: CBC  Recent Labs    02/21/24 0528 02/22/24 0555  WBC 11.8* 11.8*  HGB 9.3* 9.6*  HCT 29.5* 30.0*  PLT 586* 589*   BMET Recent Labs    02/21/24 0528  NA 134*  K 4.2  CL 96*  CO2 27  GLUCOSE 155*  BUN 35*  CREATININE 0.68  CALCIUM  8.7*   PT/INR No results for input(s): LABPROT, INR in the last 72 hours. ABG No results for input(s): PHART, HCO3 in the last 72 hours.  Invalid input(s): PCO2, PO2  Studies/Results: No results found.  Anti-infectives: Anti-infectives (From admission, onward)    Start     Dose/Rate Route Frequency Ordered Stop   02/07/24 2000  vancomycin  (VANCOCIN ) IVPB 1000 mg/200 mL premix  Status:  Discontinued        1,000 mg 200 mL/hr over 60 Minutes Intravenous Every 12 hours 02/07/24 1229 02/08/24 0725   02/07/24 0915  vancomycin  (VANCOREADY) IVPB 1500 mg/300 mL        1,500 mg 150 mL/hr over 120 Minutes Intravenous  Once 02/07/24 0823 02/07/24 1125   02/02/24 0400  micafungin  (MYCAMINE ) 100 mg in sodium chloride  0.9 % 100 mL IVPB         100 mg 105 mL/hr over 1 Hours Intravenous Daily 02/02/24 0248 03/15/24 1400   02/02/24 0400  meropenem  (MERREM ) 1,000 mg in sodium chloride  0.9 % 100 mL IVPB        1,000 mg 200 mL/hr over 30 Minutes Intravenous Every 8 hours 02/02/24 0248 03/15/24 1400   02/02/24 0315  vancomycin  (VANCOCIN ) IVPB 1000 mg/200 mL premix        1,000 mg 200 mL/hr over 60 Minutes Intravenous  Once 02/02/24 0228 02/02/24 0510   01/31/24 1800  piperacillin -tazobactam (ZOSYN ) IVPB 3.375 g  Status:  Discontinued        3.375 g 12.5 mL/hr over 240 Minutes Intravenous Every 8 hours 01/31/24 1639 02/02/24 0248   01/31/24 1745  fluconazole  (DIFLUCAN ) IVPB 400 mg  Status:  Discontinued        400 mg 100 mL/hr over 120 Minutes Intravenous Every 24 hours 01/31/24 1659 02/02/24 0248   01/22/24 1000  fluconazole  (DIFLUCAN ) IVPB 400 mg       Placed in Followed by Linked Group   400 mg 100 mL/hr over 120 Minutes Intravenous Every 24 hours 01/21/24 0956 01/30/24 1134   01/21/24 1045  fluconazole  (DIFLUCAN ) IVPB 800 mg       Placed in Followed by Linked Group   800 mg 200 mL/hr over 120 Minutes Intravenous  Once 01/21/24  9043 01/21/24 1441   01/21/24 0945  piperacillin -tazobactam (ZOSYN ) IVPB 3.375 g        3.375 g 12.5 mL/hr over 240 Minutes Intravenous Every 8 hours 01/21/24 0920 01/30/24 2216   01/16/24 1510  ceFAZolin  (ANCEF ) 2-4 GM/100ML-% IVPB       Note to Pharmacy: [name]: cabinet override      01/16/24 1510 01/17/24 0314   01/15/24 0645  ceFAZolin  (ANCEF ) IVPB 2g/100 mL premix       Placed in And Linked Group   2 g 200 mL/hr over 30 Minutes Intravenous On call to O.R. 01/15/24 0641 01/15/24 1700   01/15/24 0645  metroNIDAZOLE  (FLAGYL ) IVPB 500 mg       Placed in And Linked Group   500 mg 100 mL/hr over 60 Minutes Intravenous On call to O.R. 01/15/24 9358 01/15/24 0910   01/15/24 9357  ceFAZolin  (ANCEF ) IVPB 2g/100 mL premix  Status:  Discontinued        2 g 200 mL/hr over 30 Minutes  Intravenous 30 min pre-op 01/15/24 9357 01/15/24 0644       Assessment/Plan: [name] is a 68 yo male with gastric cardia adenocarcinoma, s/p total gastrectomy, with distal esophagectomy and roux-en-Y esophagojejunostomy on 9/3. S/p takeback by thoracic surgery for RATS repair of anastomotic leak on 9/4. S/p EGD with placement of esophageal stent on 9/11. S/p EGD with repositioning of esophageal stent 9/25.  - Has had respiratory arrest x2 associated with hypercarbia. Extubated 9/27, now on RA - ICU delirium: this has improved, Clonidine  patch. Delirium precautions.  - consider risperdal  ODT and wean clonazapam.   - Pain control: scheduled tylenol , prn oxycodone  and fentanyl . - Hypertension: home metoprolol  and hydrochlorothiazide. On clonidine  patch for sedation and hypertension. Clonidine  patch to 0.1 mg  - EJ leak: S/p esophageal stent placement. Pleural JP to remain in place. On antibiotics and antifungal coverage per thoracic, will need 6 weeks total.  Thoracic potentially to replace stent in a few weeks.    - FEN: Continue J tube feeds at goal. Patient is tolerating and having bowel function. FWF for hypernatremia - improved, going down on free water .   - ID: On broad-spectrum abx and antifungal coverage (Merrem  and Micafungin ) for treatment of aspiration pneumonia and EJ leak. Pharmacy looking at changing to enteral ABX today (OK with TCTS), stable at 12 - A-fib: remains in sinus rhythm. On therapeutic lovenox . - Code status: back to full code per patient wishes - Dispo: 4NP, Klonopin at HS, PT recommending SNF at discharge, SW working on placement.    LOS: 38 days    Cordella Idler, MD Trauma & General Surgery Use [email or web address] to contact on call provider  02/22/2024

## 2024-02-23 LAB — CBC
HCT: 30.9 % — ABNORMAL LOW (ref 39.0–52.0)
Hemoglobin: 9.8 g/dL — ABNORMAL LOW (ref 13.0–17.0)
MCH: 28.9 pg (ref 26.0–34.0)
MCHC: 31.7 g/dL (ref 30.0–36.0)
MCV: 91.2 fL (ref 80.0–100.0)
Platelets: 573 K/uL — ABNORMAL HIGH (ref 150–400)
RBC: 3.39 MIL/uL — ABNORMAL LOW (ref 4.22–5.81)
RDW: 15.6 % — ABNORMAL HIGH (ref 11.5–15.5)
WBC: 12.8 K/uL — ABNORMAL HIGH (ref 4.0–10.5)
nRBC: 0 % (ref 0.0–0.2)

## 2024-02-23 LAB — GLUCOSE, CAPILLARY
Glucose-Capillary: 138 mg/dL — ABNORMAL HIGH (ref 70–99)
Glucose-Capillary: 143 mg/dL — ABNORMAL HIGH (ref 70–99)
Glucose-Capillary: 147 mg/dL — ABNORMAL HIGH (ref 70–99)
Glucose-Capillary: 150 mg/dL — ABNORMAL HIGH (ref 70–99)
Glucose-Capillary: 154 mg/dL — ABNORMAL HIGH (ref 70–99)

## 2024-02-23 MED ORDER — HALOPERIDOL LACTATE 5 MG/ML IJ SOLN
5.0000 mg | Freq: Once | INTRAMUSCULAR | Status: AC
  Administered 2024-02-23: 5 mg via INTRAVENOUS
  Filled 2024-02-23: qty 1

## 2024-02-23 NOTE — Plan of Care (Signed)
  Problem: Education: Goal: Ability to describe self-care measures that may prevent or decrease complications (Diabetes Survival Skills Education) will improve Outcome: Progressing Goal: Individualized Educational Video(s) Outcome: Progressing   Problem: Coping: Goal: Ability to adjust to condition or change in health will improve Outcome: Progressing   Problem: Fluid Volume: Goal: Ability to maintain a balanced intake and output will improve Outcome: Progressing   Problem: Health Behavior/Discharge Planning: Goal: Ability to identify and utilize available resources and services will improve Outcome: Progressing Goal: Ability to manage health-related needs will improve Outcome: Progressing   Problem: Metabolic: Goal: Ability to maintain appropriate glucose levels will improve Outcome: Progressing   Problem: Nutritional: Goal: Maintenance of adequate nutrition will improve Outcome: Progressing Goal: Progress toward achieving an optimal weight will improve Outcome: Progressing   Problem: Skin Integrity: Goal: Risk for impaired skin integrity will decrease Outcome: Progressing   Problem: Tissue Perfusion: Goal: Adequacy of tissue perfusion will improve Outcome: Progressing   Problem: Clinical Measurements: Goal: Will remain free from infection Outcome: Progressing Goal: Respiratory complications will improve Outcome: Progressing   Problem: Activity: Goal: Risk for activity intolerance will decrease Outcome: Progressing   Problem: Pain Managment: Goal: General experience of comfort will improve and/or be controlled Outcome: Progressing   Problem: Safety: Goal: Ability to remain free from injury will improve Outcome: Progressing   Problem: Safety: Goal: Non-violent Restraint(s) Outcome: Progressing   Problem: Activity: Goal: Ability to tolerate increased activity will improve Outcome: Progressing   Problem: Respiratory: Goal: Ability to maintain a clear  airway and adequate ventilation will improve Outcome: Progressing   Problem: Role Relationship: Goal: Method of communication will improve Outcome: Progressing

## 2024-02-23 NOTE — Progress Notes (Signed)
 Patient ID: Normal Recinos, male   DOB: Nov 16, 1955, 68 y.o.   MRN: [id number] 17 Days Post-Op    Subjective: Denies new complaints. WBC 12 and stable.  A bite confused overnight and some this am as well  ROS negative except as listed above. Objective: Vital signs in last 24 hours: Temp:  [98.3 F (36.8 C)-98.5 F (36.9 C)] 98.4 F (36.9 C) (10/12 0747) Pulse Rate:  [96-107] 107 (10/12 0747) Resp:  [19-21] 21 (10/12 0747) BP: (130-145)/(80-90) 144/90 (10/12 0747) SpO2:  [93 %-98 %] 96 % (10/12 0747) FiO2 (%):  [21 %] 21 % (10/11 2115) Weight:  [72 kg] 72 kg (10/12 0500) Last BM Date : 02/22/24  Intake/Output from previous day: 10/11 0701 - 10/12 0700 In: 2582.1 [NG/GT:1849.2; IV Piggyback:643] Out: 2010 [Urine:1950; Drains:60] Intake/Output this shift: No intake/output data recorded.  General appearance: alert and cooperative Resp: clear to auscultation bilaterally GI: soft, NT, J tube working, JP drain with murky, light green appearing output. 60cc overnight  Lab Results: CBC  Recent Labs    02/22/24 0555 02/23/24 0300  WBC 11.8* 12.8*  HGB 9.6* 9.8*  HCT 30.0* 30.9*  PLT 589* 573*   BMET Recent Labs    02/21/24 0528  NA 134*  K 4.2  CL 96*  CO2 27  GLUCOSE 155*  BUN 35*  CREATININE 0.68  CALCIUM  8.7*   PT/INR No results for input(s): LABPROT, INR in the last 72 hours. ABG No results for input(s): PHART, HCO3 in the last 72 hours.  Invalid input(s): PCO2, PO2  Studies/Results: No results found.  Anti-infectives: Anti-infectives (From admission, onward)    Start     Dose/Rate Route Frequency Ordered Stop   02/07/24 2000  vancomycin  (VANCOCIN ) IVPB 1000 mg/200 mL premix  Status:  Discontinued        1,000 mg 200 mL/hr over 60 Minutes Intravenous Every 12 hours 02/07/24 1229 02/08/24 0725   02/07/24 0915  vancomycin  (VANCOREADY) IVPB 1500 mg/300 mL        1,500 mg 150 mL/hr over 120 Minutes Intravenous  Once 02/07/24 0823 02/07/24 1125    02/02/24 0400  micafungin  (MYCAMINE ) 100 mg in sodium chloride  0.9 % 100 mL IVPB        100 mg 105 mL/hr over 1 Hours Intravenous Daily 02/02/24 0248 03/15/24 1400   02/02/24 0400  meropenem  (MERREM ) 1,000 mg in sodium chloride  0.9 % 100 mL IVPB        1,000 mg 200 mL/hr over 30 Minutes Intravenous Every 8 hours 02/02/24 0248 03/15/24 1400   02/02/24 0315  vancomycin  (VANCOCIN ) IVPB 1000 mg/200 mL premix        1,000 mg 200 mL/hr over 60 Minutes Intravenous  Once 02/02/24 0228 02/02/24 0510   01/31/24 1800  piperacillin -tazobactam (ZOSYN ) IVPB 3.375 g  Status:  Discontinued        3.375 g 12.5 mL/hr over 240 Minutes Intravenous Every 8 hours 01/31/24 1639 02/02/24 0248   01/31/24 1745  fluconazole  (DIFLUCAN ) IVPB 400 mg  Status:  Discontinued        400 mg 100 mL/hr over 120 Minutes Intravenous Every 24 hours 01/31/24 1659 02/02/24 0248   01/22/24 1000  fluconazole  (DIFLUCAN ) IVPB 400 mg       Placed in Followed by Linked Group   400 mg 100 mL/hr over 120 Minutes Intravenous Every 24 hours 01/21/24 0956 01/30/24 1134   01/21/24 1045  fluconazole  (DIFLUCAN ) IVPB 800 mg       Placed in Followed  by Linked Group   800 mg 200 mL/hr over 120 Minutes Intravenous  Once 01/21/24 0956 01/21/24 1441   01/21/24 0945  piperacillin -tazobactam (ZOSYN ) IVPB 3.375 g        3.375 g 12.5 mL/hr over 240 Minutes Intravenous Every 8 hours 01/21/24 0920 01/30/24 2216   01/16/24 1510  ceFAZolin  (ANCEF ) 2-4 GM/100ML-% IVPB       Note to Pharmacy: Cindie Pizza: cabinet override      01/16/24 1510 01/17/24 0314   01/15/24 0645  ceFAZolin  (ANCEF ) IVPB 2g/100 mL premix       Placed in And Linked Group   2 g 200 mL/hr over 30 Minutes Intravenous On call to O.R. 01/15/24 0641 01/15/24 1700   01/15/24 0645  metroNIDAZOLE  (FLAGYL ) IVPB 500 mg       Placed in And Linked Group   500 mg 100 mL/hr over 60 Minutes Intravenous On call to O.R. 01/15/24 9358 01/15/24 0910   01/15/24 9357  ceFAZolin  (ANCEF )  IVPB 2g/100 mL premix  Status:  Discontinued        2 g 200 mL/hr over 30 Minutes Intravenous 30 min pre-op 01/15/24 9357 01/15/24 0644       Assessment/Plan: Mr. Honor is a 68 yo male with gastric cardia adenocarcinoma, s/p total gastrectomy, with distal esophagectomy and roux-en-Y esophagojejunostomy on 9/3. S/p takeback by thoracic surgery for RATS repair of anastomotic leak on 9/4. S/p EGD with placement of esophageal stent on 9/11. S/p EGD with repositioning of esophageal stent 9/25.  - Has had respiratory arrest x2 associated with hypercarbia. Extubated 9/27, now on RA - ICU delirium: this has improved, Clonidine  patch. Delirium precautions.  - consider risperdal  ODT and wean clonazapam.   - Pain control: scheduled tylenol , prn oxycodone  and fentanyl . - Hypertension: home metoprolol  and hydrochlorothiazide. On clonidine  patch for sedation and hypertension. Clonidine  patch to 0.1 mg  - EJ leak: S/p esophageal stent placement. Pleural JP to remain in place. On antibiotics and antifungal coverage per thoracic, will need 6 weeks total.  Thoracic potentially to replace stent in a few weeks.    - FEN: Continue J tube feeds at goal. Patient is tolerating and having bowel function. FWF for hypernatremia - improved, going down on free water .   - ID: On broad-spectrum abx and antifungal coverage (Merrem  and Micafungin ) for treatment of aspiration pneumonia and EJ leak. Pharmacy looking at changing to enteral ABX today (OK with TCTS), stable at 12 - A-fib: remains in sinus rhythm. On therapeutic lovenox . - Code status: back to full code per patient wishes - Dispo: 4NP, Klonopin at HS, PT recommending SNF at discharge, SW working on placement.    LOS: 39 days    Burnard FORBES Banter, PA-C  Trauma & General Surgery Use [email or web address] to contact on call provider  02/23/2024

## 2024-02-23 NOTE — Progress Notes (Signed)
 RN to pt room due to bed alarm ringing. Pt with lower extremities over side rail and attempting to get OOB. RN attempting to get pt back in bed, pt stating You are trying to kill me, let me get out, I don't need your help your just trying to kill me. RN attempting to de-escalate pt by reorienting and explaining the current plan of care to pt, and pt refusing. RN grabbed assistance from another Char[employer] fundraiser. Glenna, RN able to de-escalate pt's behavior. PRN pain meds given. Dr. Polly notified. New orders received.

## 2024-02-24 LAB — CBC
HCT: 31.2 % — ABNORMAL LOW (ref 39.0–52.0)
Hemoglobin: 10.1 g/dL — ABNORMAL LOW (ref 13.0–17.0)
MCH: 28.9 pg (ref 26.0–34.0)
MCHC: 32.4 g/dL (ref 30.0–36.0)
MCV: 89.1 fL (ref 80.0–100.0)
Platelets: 585 K/uL — ABNORMAL HIGH (ref 150–400)
RBC: 3.5 MIL/uL — ABNORMAL LOW (ref 4.22–5.81)
RDW: 15.8 % — ABNORMAL HIGH (ref 11.5–15.5)
WBC: 12.6 K/uL — ABNORMAL HIGH (ref 4.0–10.5)
nRBC: 0 % (ref 0.0–0.2)

## 2024-02-24 LAB — RENAL FUNCTION PANEL
Albumin: 1.9 g/dL — ABNORMAL LOW (ref 3.5–5.0)
Anion gap: 11 (ref 5–15)
BUN: 29 mg/dL — ABNORMAL HIGH (ref 8–23)
CO2: 28 mmol/L (ref 22–32)
Calcium: 9.2 mg/dL (ref 8.9–10.3)
Chloride: 94 mmol/L — ABNORMAL LOW (ref 98–111)
Creatinine, Ser: 0.66 mg/dL (ref 0.61–1.24)
GFR, Estimated: >60 mL/min (ref >60)
Glucose, Bld: 150 mg/dL — ABNORMAL HIGH (ref 70–99)
Phosphorus: 3.6 mg/dL (ref 2.5–4.6)
Potassium: 4.1 mmol/L (ref 3.5–5.1)
Sodium: 133 mmol/L — ABNORMAL LOW (ref 135–145)

## 2024-02-24 LAB — GLUCOSE, CAPILLARY
Glucose-Capillary: 152 mg/dL — ABNORMAL HIGH (ref 70–99)
Glucose-Capillary: 157 mg/dL — ABNORMAL HIGH (ref 70–99)
Glucose-Capillary: 125 mg/dL — ABNORMAL HIGH (ref 70–99)
Glucose-Capillary: 115 mg/dL — ABNORMAL HIGH (ref 70–99)

## 2024-02-24 LAB — MAGNESIUM: Magnesium: 2.1 mg/dL (ref 1.7–2.4)

## 2024-02-24 MED ORDER — RISPERIDONE 0.5 MG PO TBDP
0.5000 mg | ORAL_TABLET | Freq: Every day | ORAL | Status: DC
  Administered 2024-02-24 – 2024-03-13 (×19): 0.5 mg via SUBLINGUAL
  Filled 2024-02-24 (×22): qty 1

## 2024-02-24 MED ORDER — CLEVIDIPINE BUTYRATE 0.5 MG/ML IV EMUL
INTRAVENOUS | Status: AC
  Filled 2024-02-24: qty 50

## 2024-02-24 MED ORDER — FREE WATER
30.0000 mL | Status: DC
  Administered 2024-02-24 – 2024-02-26 (×12): 30 mL

## 2024-02-24 NOTE — Progress Notes (Addendum)
 Patient ID: [name], male   DOB: March 27, 1956, 68 y.o.   MRN: [id number] 18 Days Post-Op    Subjective: WBC stable at 12. Delirious yesterday afternoon requiring haldol  x 1. No complaints this AM. Drowsy.  ROS negative except as listed above. Objective: Vital signs in last 24 hours: Temp:  [97.9 F (36.6 C)-98.5 F (36.9 C)] 98 F (36.7 C) (10/13 0327) Pulse Rate:  [94-107] 100 (10/13 0327) Resp:  [14-21] 17 (10/13 0327) BP: (134-161)/(80-98) 148/98 (10/13 0327) SpO2:  [95 %-97 %] 96 % (10/13 0327) FiO2 (%):  [21 %] 21 % (10/12 2041) Weight:  [72.1 kg] 72.1 kg (10/13 0327) Last BM Date : 02/23/24  Intake/Output from previous day: 10/12 0701 - 10/13 0700 In: 2701.5 [NG/GT:1713.8; IV Piggyback:987.7] Out: 1925 [Urine:1900; Drains:25] Intake/Output this shift: No intake/output data recorded.  General appearance: alert and cooperative Resp: clear to auscultation bilaterally GI: soft, NT, J tube working, JP drain with minimal murky fluid in bulb this AM, 25cc over last 24h  Lab Results: CBC  Recent Labs    02/23/24 0300 02/24/24 0530  WBC 12.8* 12.6*  HGB 9.8* 10.1*  HCT 30.9* 31.2*  PLT 573* 585*   BMET No results for input(s): NA, K, CL, CO2, GLUCOSE, BUN, CREATININE, CALCIUM  in the last 72 hours.  PT/INR No results for input(s): LABPROT, INR in the last 72 hours. ABG No results for input(s): PHART, HCO3 in the last 72 hours.  Invalid input(s): PCO2, PO2  Studies/Results: No results found.  Anti-infectives: Anti-infectives (From admission, onward)    Start     Dose/Rate Route Frequency Ordered Stop   02/07/24 2000  vancomycin  (VANCOCIN ) IVPB 1000 mg/200 mL premix  Status:  Discontinued        1,000 mg 200 mL/hr over 60 Minutes Intravenous Every 12 hours 02/07/24 1229 02/08/24 0725   02/07/24 0915  vancomycin  (VANCOREADY) IVPB 1500 mg/300 mL        1,500 mg 150 mL/hr over 120 Minutes Intravenous  Once 02/07/24 0823 02/07/24  1125   02/02/24 0400  micafungin  (MYCAMINE ) 100 mg in sodium chloride  0.9 % 100 mL IVPB        100 mg 105 mL/hr over 1 Hours Intravenous Daily 02/02/24 0248 03/15/24 1400   02/02/24 0400  meropenem  (MERREM ) 1,000 mg in sodium chloride  0.9 % 100 mL IVPB        1,000 mg 200 mL/hr over 30 Minutes Intravenous Every 8 hours 02/02/24 0248 03/15/24 1400   02/02/24 0315  vancomycin  (VANCOCIN ) IVPB 1000 mg/200 mL premix        1,000 mg 200 mL/hr over 60 Minutes Intravenous  Once 02/02/24 0228 02/02/24 0510   01/31/24 1800  piperacillin -tazobactam (ZOSYN ) IVPB 3.375 g  Status:  Discontinued        3.375 g 12.5 mL/hr over 240 Minutes Intravenous Every 8 hours 01/31/24 1639 02/02/24 0248   01/31/24 1745  fluconazole  (DIFLUCAN ) IVPB 400 mg  Status:  Discontinued        400 mg 100 mL/hr over 120 Minutes Intravenous Every 24 hours 01/31/24 1659 02/02/24 0248   01/22/24 1000  fluconazole  (DIFLUCAN ) IVPB 400 mg       Placed in Followed by Linked Group   400 mg 100 mL/hr over 120 Minutes Intravenous Every 24 hours 01/21/24 0956 01/30/24 1134   01/21/24 1045  fluconazole  (DIFLUCAN ) IVPB 800 mg       Placed in Followed by Linked Group   800 mg 200 mL/hr over 120 Minutes Intravenous  Once 01/21/24 0956 01/21/24 1441   01/21/24 0945  piperacillin -tazobactam (ZOSYN ) IVPB 3.375 g        3.375 g 12.5 mL/hr over 240 Minutes Intravenous Every 8 hours 01/21/24 0920 01/30/24 2216   01/16/24 1510  ceFAZolin  (ANCEF ) 2-4 GM/100ML-% IVPB       Note to Pharmacy: Cindie Pizza: cabinet override      01/16/24 1510 01/17/24 0314   01/15/24 0645  ceFAZolin  (ANCEF ) IVPB 2g/100 mL premix       Placed in And Linked Group   2 g 200 mL/hr over 30 Minutes Intravenous On call to O.R. 01/15/24 0641 01/15/24 1700   01/15/24 0645  metroNIDAZOLE  (FLAGYL ) IVPB 500 mg       Placed in And Linked Group   500 mg 100 mL/hr over 60 Minutes Intravenous On call to O.R. 01/15/24 9358 01/15/24 0910   01/15/24 9357  ceFAZolin   (ANCEF ) IVPB 2g/100 mL premix  Status:  Discontinued        2 g 200 mL/hr over 30 Minutes Intravenous 30 min pre-op 01/15/24 9357 01/15/24 0644       Assessment/Plan: [name] is a 68 yo male with gastric cardia adenocarcinoma, s/p total gastrectomy, with distal esophagectomy and roux-en-Y esophagojejunostomy on 9/3. S/p takeback by thoracic surgery for RATS repair of anastomotic leak on 9/4. S/p EGD with placement of esophageal stent on 9/11. S/p EGD with repositioning of esophageal stent 9/25.  - Has had respiratory arrest x2 associated with hypercarbia. Extubated 9/27, now on RA - ICU delirium: this has improved, Clonidine  patch. Delirium precautions.  - consider risperdal  ODT, clonazepam discontinued on Friday - Pain control: scheduled tylenol , prn oxycodone  and fentanyl . - Hypertension: home metoprolol  and hydrochlorothiazide. On clonidine  patch for sedation and hypertension. Clonidine  patch to 0.1 mg  - EJ leak: S/p esophageal stent placement. Pleural JP to remain in place. On antibiotics and antifungal coverage per thoracic, will need 6 weeks total.  Thoracic potentially to replace stent in a few weeks.    - FEN: Continue J tube feeds at goal. Patient is tolerating and having bowel function. FWF for hypernatremia - Sodium 133 on AM labs, will decrease FWF to normal protocol of 30q4 for tube maintenance, recheck Na in AM.  - ID: On broad-spectrum abx and antifungal coverage (Merrem  and Micafungin ) for treatment of aspiration pneumonia and EJ leak. Per report, discussions over weekend with pharmacy to transition to enteral antibiotics (OK with TCTS). Discussed with pharmacy today and no good options as well as TF often interfere with absorption so will continue IV. WBC stable at 12 - A-fib: remains in sinus rhythm. On therapeutic lovenox . - Code status: back to full code per patient wishes - Dispo: 4NP, PT recommending SNF at discharge, SW working on placement.    LOS: 40 days     Jon Silversmith, MD  Trauma & General Surgery  02/24/2024

## 2024-02-24 NOTE — Plan of Care (Signed)
  Problem: Education: Goal: Ability to describe self-care measures that may prevent or decrease complications (Diabetes Survival Skills Education) will improve Outcome: Progressing Goal: Individualized Educational Video(s) Outcome: Progressing   Problem: Coping: Goal: Ability to adjust to condition or change in health will improve Outcome: Progressing   Problem: Fluid Volume: Goal: Ability to maintain a balanced intake and output will improve Outcome: Progressing   Problem: Health Behavior/Discharge Planning: Goal: Ability to identify and utilize available resources and services will improve Outcome: Progressing Goal: Ability to manage health-related needs will improve Outcome: Progressing   Problem: Metabolic: Goal: Ability to maintain appropriate glucose levels will improve Outcome: Progressing   Problem: Nutritional: Goal: Maintenance of adequate nutrition will improve Outcome: Progressing Goal: Progress toward achieving an optimal weight will improve Outcome: Progressing   Problem: Skin Integrity: Goal: Risk for impaired skin integrity will decrease Outcome: Progressing   Problem: Tissue Perfusion: Goal: Adequacy of tissue perfusion will improve Outcome: Progressing   Problem: Education: Goal: Knowledge of General Education information will improve Description: Including pain rating scale, medication(s)/side effects and non-pharmacologic comfort measures Outcome: Progressing   Problem: Clinical Measurements: Goal: Will remain free from infection Outcome: Progressing Goal: Respiratory complications will improve Outcome: Progressing   Problem: Activity: Goal: Risk for activity intolerance will decrease Outcome: Progressing   Problem: Pain Managment: Goal: General experience of comfort will improve and/or be controlled Outcome: Progressing   Problem: Safety: Goal: Ability to remain free from injury will improve Outcome: Progressing

## 2024-02-24 NOTE — Progress Notes (Signed)
 Nutrition Follow-up  DOCUMENTATION CODES:   Severe malnutrition in context of chronic illness  INTERVENTION:  Continue tube feeding via J-tube: Continue Osmolite 1.5 @ 65 ml/h (1560 ml per day) Prosource TF20 60 ml BID   Provides 2500 kcal, 137 gm protein, 1185 ml free water  daily   Monitor for diet advancement   NUTRITION DIAGNOSIS:   Severe Malnutrition related to cancer and cancer related treatments as evidenced by severe muscle depletion, moderate fat depletion, energy intake < or equal to 75% for > or equal to 1 month, percent weight loss (has lost 28 lbs, 15% in 6 months). - Ongoing   GOAL:   Patient will meet greater than or equal to 90% of their needs - Meeting via TF  MONITOR:   Diet advancement, TF tolerance, Labs, I & O's, Skin  REASON FOR ASSESSMENT:   Consult Assessment of nutrition requirement/status, Enteral/tube feeding initiation and management  ASSESSMENT:   [name] is a 68 yo male who was diagnosed with uT2N0 adenocarcinoma of the gastric cardia earlier this year. EUS showed evidence of invasion into the GE junction. The patient completed 3 cycles neoadjuvant FLOT with some complications, and was not able to tolerate further chemotherapy. Presented to [name] for surgery now post op total gastrectomy with distal esophagectomy, roux-en-Y esophagojejunostomy, J-tube. PMH of T2DM, GERD, HTN, a-fib, hernia repair.  9/3 - total gastrectomy with distal esophagectomy, roux-en-Y esophagojejunostomy, J-tube, chest tube, NGT placed to lIS  9/4 - s/p Thoracoscopy repair of esophageal leak using Myriad Matrix  9/6 - transferred to ICU for hypoxia and increased o2 requirements 9/7 - Trickle tube feeds started, 20 ml/hr 9/8 - Tube feeds advanced to 30 ml/hr, transferred to progressive  9/9 - Tube feeds advanced to 40 ml/hr increase by 10 ml every 6 hours until goal of 60 ml/hr 9/10 - TF held 9/11 - Back to OR for leak, esophageal stent placed, intubated 9/12 -  Extubated  9/20 - cardiac arrest with suspected aspiration event 9/25 - arrest overnight, ROSC with Epi; intubated; TF held 9/27 - extubated; TF resumed at trickle 9/29 - TF to goal, TF increased to 65 ml/hr for wt loss 10/3 - Transferred to progressive    Continues to tolerate J-tube feeds at goal. Having BM, has FMS. Pt continues to be delirious speech mumbled. Pt unaware of his name or place. No N/V.    Pt's weight appears stated on admission.Weight has fluctuated since admission due to fluid/drains. Difficult to access amount of weight loss but overall pt appears to be losing weight. Repeat exam continues to show serve muscle and fat wasting  New weight of 72.1 kg today. Monitor weight trends, may need to increase tube feeding rate again if pt continues to lose weight.  Some weight loss is to be expected for pt being bedbound.   Disposition: SNF   Intake/Output Summary (Last 24 hours) at 02/24/2024 1421 Last data filed at 02/24/2024 1148 Gross per 24 hour  Intake 2631.45 ml  Output 2335 ml  Net 296.45 ml   Drains/Lines: JP Chest tube: 25 ml x 24 hours  J-tube FMS: No output charted x 24 hours   Admit weight: 80.7 kg - Stated? Current weight: 72.1 kg    Average Meal Intake: NPO  Nutritionally Relevant Medications: Scheduled Meds:  feeding supplement (PROSource TF20)  60 mL Per Tube BID   free water   30 mL Per Tube Q4H   hydrochlorothiazide  25 mg Per Tube Daily   insulin  aspart  0-9 Units Subcutaneous Q6H   Continuous Infusions:  feeding supplement (OSMOLITE 1.5 CAL) 1,000 mL (02/24/24 1401)   meropenem  (MERREM ) IV Stopped (02/24/24 1401)   micafungin  (MYCAMINE ) 100 mg in sodium chloride  0.9 % 100 mL IVPB Stopped (02/24/24 1130)   Labs Reviewed: Sodium 133 BUN 29 CBG ranges from 138-152 mg/dL over the last 24 hours HgbA1c 5.3  Diet Order:   Diet Order             Diet NPO time specified  Diet effective now                   EDUCATION NEEDS:    Education needs have been addressed  Skin:  Skin Assessment: Skin Integrity Issues: Skin Integrity Issues:: Incisions, Other (Comment) Incisions: 9/3 abdomen, 9/4 rt chest Other: right back wound, left buttocks wound  Last BM:  10/12 via FMS no output charted  Height:   Ht Readings from Last 1 Encounters:  01/16/24 5' 11 (1.803 m)    Weight:   Wt Readings from Last 1 Encounters:  02/24/24 72.1 kg    Ideal Body Weight:  78.2 kg  BMI:  Body mass index is 22.17 kg/m.  Estimated Nutritional Needs:   Kcal:  2300-2500 kcal  Protein:  120-140 gm  Fluid:  >/=2L/day  [name], RD Registered Dietitian  See Amion for more information

## 2024-02-24 NOTE — Progress Notes (Signed)
 Physical Therapy Treatment Patient Details Name: [name] MRN: [id number] DOB: [birth year] Today's Date: 02/24/2024   History of Present Illness 68 y.o. male presents to [facility] hospital on 01/15/2024 with gastric CA. Pt underwent total gastrectomy with omentectomy and D2 lymphadenectomy, esophagojejunostomy with Jtube placement, splenic biopsy on 9/3. Pt developed bilious output into chest tube on 9/4, returned to OR for EGD and thoracoscopy with application of wound matrix to EJ anastomosis. ETT 9/11-9/12 for desats and unresponsiveness. S/p EGD and esophageal stent placement 9/11. Cardiac arrest 9/19. Re-ett 9/19-9/22. 9/26 PEA respiratory failure with intubation extubation 9/27 PMH includes asthma, OA, COPD, GERD, DMII, MDD, emphysema.    PT Comments  Pt intermittently confused during session, states he has only been in his hospital room for a day when he is on day 40 this admission. Pt fatiguing quickly this date with +orthostatic hypotension (see below). Pt needs to mobilize OOB daily, is very deconditioned. PT to ocntinue to follow.   BP in supine 137/87 BP standing 3 min 110/76 BP return to sitting with Les elevated 128/89     If plan is discharge home, recommend the following: A lot of help with walking and/or transfers;A lot of help with bathing/dressing/bathroom;Assistance with cooking/housework;Direct supervision/assist for medications management;Direct supervision/assist for financial management;Assist for transportation;Help with stairs or ramp for entrance   Can travel by private vehicle        Equipment Recommendations  Wheelchair (measurements PT);Wheelchair cushion (measurements PT);BSC/3in1;Rolling walker (2 wheels)    Recommendations for Other Services       Precautions / Restrictions Precautions Precautions: Fall Precaution/Restrictions Comments: R JP drain x 1, J tube, R port Restrictions Weight Bearing Restrictions Per Provider Order: No     Mobility  Bed  Mobility Overal bed mobility: Needs Assistance Bed Mobility: Rolling, Sidelying to Sit Rolling: Contact guard assist Sidelying to sit: Min assist       General bed mobility comments: trunk rise assist    Transfers Overall transfer level: Needs assistance Equipment used: Rolling walker (2 wheels) Transfers: Sit to/from Stand Sit to Stand: Min assist           General transfer comment: assist for power up, rise, and steady    Ambulation/Gait Ambulation/Gait assistance: Min assist Gait Distance (Feet): 12 Feet Assistive device: Rolling walker (2 wheels) Gait Pattern/deviations: Step-through pattern, Staggering right, Staggering left, Trunk flexed Gait velocity: decr     General Gait Details: assist to steady and guide RW, pt endorsing dizziness with >20 pt drop in SBP and >10 in DBP. sat down with recovery   Stairs             Wheelchair Mobility     Tilt Bed    Modified Rankin (Stroke Patients Only)       Balance Overall balance assessment: Needs assistance Sitting-balance support: No upper extremity supported, Feet supported Sitting balance-Leahy Scale: Fair     Standing balance support: Bilateral upper extremity supported, Reliant on assistive device for balance Standing balance-Leahy Scale: Poor                              Communication Communication Communication: Impaired Factors Affecting Communication: Reduced clarity of speech  Cognition Arousal: Alert Behavior During Therapy: WFL for tasks assessed/performed   PT - Cognitive impairments: Orientation, Awareness, Memory, Problem solving, Safety/Judgement Difficult to assess due to: Impaired communication Orientation impairments: Place, Time  PT - Cognition Comments: intermittent confusion, states I've been here in this room since yesterday but has been in this room >1 week. Pt recalls getting up without assist earlier in the day, states no one was  helping me Following commands: Intact Following commands impaired: Follows one step commands with increased time    Cueing Cueing Techniques: Verbal cues  Exercises      General Comments General comments (skin integrity, edema, etc.): vss except BP orthostatic      Pertinent Vitals/Pain Pain Assessment Pain Assessment: Faces Faces Pain Scale: Hurts little more Pain Location: generalized, you name it Pain Descriptors / Indicators: Sore, Grimacing, Guarding Pain Intervention(s): Monitored during session, Limited activity within patient's tolerance, Repositioned    Home Living                          Prior Function            PT Goals (current goals can now be found in the care plan section) Acute Rehab PT Goals Patient Stated Goal: to return to independence PT Goal Formulation: With patient Time For Goal Achievement: 03/02/24 Potential to Achieve Goals: Fair Progress towards PT goals: Progressing toward goals    Frequency    Min 2X/week      PT Plan      Co-evaluation              AM-PAC PT 6 Clicks Mobility   Outcome Measure  Help needed turning from your back to your side while in a flat bed without using bedrails?: A Little Help needed moving from lying on your back to sitting on the side of a flat bed without using bedrails?: A Little Help needed moving to and from a bed to a chair (including a wheelchair)?: A Little Help needed standing up from a chair using your arms (e.g., wheelchair or bedside chair)?: A Little Help needed to walk in hospital room?: A Lot Help needed climbing 3-5 steps with a railing? : A Lot 6 Click Score: 16    End of Session   Activity Tolerance: Patient limited by fatigue;Treatment limited secondary to medical complications (Comment) Patient left: in chair;with call bell/phone within reach;with chair alarm set Nurse Communication: Mobility status PT Visit Diagnosis: Other abnormalities of gait and mobility  (R26.89);Muscle weakness (generalized) (M62.81);Pain     Time: 8371-8346 PT Time Calculation (min) (ACUTE ONLY): 25 min  Charges:    $Gait Training: 8-22 mins $Therapeutic Activity: 8-22 mins PT General Charges $$ ACUTE PT VISIT: 1 Visit                     [name], PT DPT Acute Rehabilitation Services Secure Chat Preferred  Office [phone]    Aleese Kamps E Stroup 02/24/2024, 5:00 PM

## 2024-02-25 LAB — GLUCOSE, CAPILLARY
Glucose-Capillary: 104 mg/dL — ABNORMAL HIGH (ref 70–99)
Glucose-Capillary: 153 mg/dL — ABNORMAL HIGH (ref 70–99)
Glucose-Capillary: 95 mg/dL (ref 70–99)

## 2024-02-25 LAB — RENAL FUNCTION PANEL
Albumin: 2 g/dL — ABNORMAL LOW (ref 3.5–5.0)
Anion gap: 11 (ref 5–15)
BUN: 28 mg/dL — ABNORMAL HIGH (ref 8–23)
CO2: 29 mmol/L (ref 22–32)
Calcium: 9.2 mg/dL (ref 8.9–10.3)
Chloride: 94 mmol/L — ABNORMAL LOW (ref 98–111)
Creatinine, Ser: 0.65 mg/dL (ref 0.61–1.24)
GFR, Estimated: >60 mL/min (ref >60)
Glucose, Bld: 121 mg/dL — ABNORMAL HIGH (ref 70–99)
Phosphorus: 3.3 mg/dL (ref 2.5–4.6)
Potassium: 4.6 mmol/L (ref 3.5–5.1)
Sodium: 134 mmol/L — ABNORMAL LOW (ref 135–145)

## 2024-02-25 LAB — MAGNESIUM: Magnesium: 2 mg/dL (ref 1.7–2.4)

## 2024-02-25 MED ORDER — PROTAMINE SULFATE 10 MG/ML IV SOLN
INTRAVENOUS | Status: AC
  Filled 2024-02-25: qty 35

## 2024-02-25 MED ORDER — HEPARIN SODIUM (PORCINE) 1000 UNIT/ML IJ SOLN
INTRAMUSCULAR | Status: AC
Start: 2024-02-25 — End: 2024-02-25
  Filled 2024-02-25: qty 50

## 2024-02-25 MED ORDER — METOPROLOL TARTRATE 25 MG/10 ML ORAL SUSPENSION
62.5000 mg | Freq: Two times a day (BID) | ORAL | Status: DC
  Administered 2024-02-25 – 2024-03-14 (×37): 62.5 mg via JEJUNOSTOMY
  Filled 2024-02-25 (×40): qty 25

## 2024-02-25 NOTE — Progress Notes (Signed)
      [address] [name] [address]             [phone]     Per Dr Shyrl plan will be to exchange stent at 6 weeks    [name] E Jon Bazzi, PA-C

## 2024-02-25 NOTE — TOC Progression Note (Signed)
 Transition of Care (TOC) - Progression Note  Rayfield Gobble RN,BSN Inpatient Care Management Unit 4NP (Non Trauma)- RN Case Manager See Treatment Team for direct Phone #   Patient Details  Name: [name] MRN: [id number] Date of Birth: [birth year]  Transition of Care [facility]) CM/SW Contact  Gobble Rayfield Hurst, RN Phone Number: 02/25/2024, 12:42 PM  Clinical Narrative:    TC made to pt's sister [name] to discuss LTACH option as possible plan for transition vs SNF.  Discussed with [name] LTACH level of care vs SNF as well as pt's current acute needs.  Choice given for [facility]- [facility]. [name] voiced she would possibly be interested in [facility]- would like to tour facility and speak with liaison to further discuss LTACH and what [facility] can offer.   Also explained to sister [name] that insurance would have to approve LTACH if they were interested in pursuing. [name] voiced that pt is not interested in SNF however is currently not at level for family to assist with care at home. Sister voiced that family is looking at bringing pt to a family member's home to assist with care when pt ready for return home.  [name] voiced she has reached out to pt's insurance provider to discuss options for care at home.   CM will have [facility] liaison reach out to [name] and follow up for possible insurance auth process.    Expected Discharge Plan: Long Term Acute Care (LTAC) Barriers to Discharge: Continued Medical Work up, English as a second language teacher, SNF Pending bed offer               Expected Discharge Plan and Services     Post Acute Care Choice: Long Term Acute Care (LTAC) Living arrangements for the past 2 months: Single Family Home                                       Social Drivers of Health (SDOH) Interventions SDOH Screenings   Food Insecurity: No Food Insecurity (01/28/2024)  Housing: Low Risk  (01/28/2024)  Transportation Needs: No Transportation Needs  (01/28/2024)  Utilities: Not At Risk (01/28/2024)  Depression (PHQ2-9): Low Risk  (11/25/2023)  Financial Resource Strain: Low Risk  (08/20/2023)  Social Connections: Unknown (01/28/2024)  Stress: No Stress Concern Present (08/20/2023)  Tobacco Use: High Risk (01/28/2024)    Readmission Risk Interventions    11/05/2023   12:23 PM  Readmission Risk Prevention Plan  Transportation Screening Complete  PCP or Specialist Appt within 3-5 Days Complete  HRI or Home Care Consult Complete  Social Work Consult for Recovery Care Planning/Counseling Complete  Palliative Care Screening Complete  Medication Review Oceanographer) Complete

## 2024-02-25 NOTE — Progress Notes (Signed)
 Occupational Therapy Treatment Patient Details Name: [name] MRN: [id number] DOB: February 11, 1956 Today's Date: 02/25/2024   History of present illness 68 y.o. male presents to [facility] hospital on 01/15/2024 with gastric CA. Pt underwent total gastrectomy with omentectomy and D2 lymphadenectomy, esophagojejunostomy with Jtube placement, splenic biopsy on 9/3. Pt developed bilious output into chest tube on 9/4, returned to OR for EGD and thoracoscopy with application of wound matrix to EJ anastomosis. ETT 9/11-9/12 for desats and unresponsiveness. S/p EGD and esophageal stent placement 9/11. Cardiac arrest 9/19. Re-ett 9/19-9/22. 9/26 PEA respiratory failure with intubation extubation 9/27 PMH includes asthma, OA, COPD, GERD, DMII, MDD, emphysema.   OT comments  Pt progressing toward established OT goals with goals updated this session; 2 upgraded and one kept; additionally added new cognitive goal. Pt transferring to chair with CGA-min A this session with RW. Pt needing cues for initiation and sequencing BADL and mobility this session. Will continue to follow per POC. Patient will benefit from continued inpatient follow up therapy, <3 hours/day       If plan is discharge home, recommend the following:  A lot of help with walking and/or transfers;Two people to help with walking and/or transfers;A lot of help with bathing/dressing/bathroom;Two people to help with bathing/dressing/bathroom;Assistance with cooking/housework;Assist for transportation;Help with stairs or ramp for entrance   Equipment Recommendations  Other (comment)    Recommendations for Other Services      Precautions / Restrictions Precautions Precautions: Fall Recall of Precautions/Restrictions: Impaired Precaution/Restrictions Comments: R JP drain x 1, J tube, R port Restrictions Weight Bearing Restrictions Per Provider Order: No       Mobility Bed Mobility Overal bed mobility: Needs Assistance Bed Mobility: Rolling,  Sidelying to Sit Rolling: Contact guard assist Sidelying to sit: Min assist       General bed mobility comments: trunk rise assist    Transfers Overall transfer level: Needs assistance Equipment used: Rolling walker (2 wheels) Transfers: Sit to/from Stand, Bed to chair/wheelchair/BSC Sit to Stand: Contact guard assist     Step pivot transfers: Contact guard assist           Balance Overall balance assessment: Needs assistance Sitting-balance support: No upper extremity supported, Feet supported Sitting balance-Leahy Scale: Fair Sitting balance - Comments: statically   Standing balance support: Bilateral upper extremity supported, Reliant on assistive device for balance Standing balance-Leahy Scale: Poor Standing balance comment: reliant on bilat UE support                           ADL either performed or assessed with clinical judgement   ADL Overall ADL's : Needs assistance/impaired     Grooming: Wash/dry face;Set up;Sitting           Upper Body Dressing : Minimal assistance;Sitting       Toilet Transfer: Stand-pivot;Rolling walker (2 wheels);Contact guard assist;Minimal assistance Toilet Transfer Details (indicate cue type and reason): cues for sequence         Functional mobility during ADLs: Minimal assistance;Rolling walker (2 wheels);Contact guard assist      Extremity/Trunk Assessment Upper Extremity Assessment Upper Extremity Assessment: Generalized weakness   Lower Extremity Assessment Lower Extremity Assessment: Defer to PT evaluation        Vision   Vision Assessment?: Wears glasses for reading   Perception Perception Perception: Not tested   Praxis Praxis Praxis: Not tested   Communication Communication Communication: Impaired Factors Affecting Communication: Reduced clarity of speech   Cognition Arousal: Alert  Behavior During Therapy: WFL for tasks assessed/performed Cognition: No family/caregiver present to  determine baseline   Orientation impairments: Situation, Time Awareness: Online awareness impaired Memory impairment (select all impairments): Short-term memory, Working memory Attention impairment (select first level of impairment): Sustained attention Executive functioning impairment (select all impairments): Reasoning, Problem solving, Initiation OT - Cognition Comments: needing cues to initiate transfers as well as cues to sustain BADL tasks                 Following commands: Impaired Following commands impaired: Follows one step commands with increased time      Cueing   Cueing Techniques: Verbal cues  Exercises      Shoulder Instructions       General Comments BP 148/92 (108) on arrival; dropping to 102/79 (86) after transfer to chair and seated position for BP    Pertinent Vitals/ Pain       Pain Assessment Pain Assessment: Faces Faces Pain Scale: Hurts a little bit Pain Location: generalized Pain Descriptors / Indicators: Sore, Grimacing, Guarding Pain Intervention(s): Limited activity within patient's tolerance, Monitored during session  Home Living                                          Prior Functioning/Environment              Frequency  Min 2X/week        Progress Toward Goals  OT Goals(current goals can now be found in the care plan section)  Progress towards OT goals: Progressing toward goals  Acute Rehab OT Goals OT Goal Formulation: With patient Time For Goal Achievement: 03/10/24 Potential to Achieve Goals: Fair ADL Goals Pt Will Perform Grooming: with contact guard assist;standing Pt Will Transfer to Toilet: with contact guard assist;ambulating  Plan      Co-evaluation                 AM-PAC OT 6 Clicks Daily Activity     Outcome Measure   Help from another person eating meals?: A Little Help from another person taking care of personal grooming?: A Little Help from another person toileting,  which includes using toliet, bedpan, or urinal?: A Lot Help from another person bathing (including washing, rinsing, drying)?: A Lot Help from another person to put on and taking off regular upper body clothing?: A Lot Help from another person to put on and taking off regular lower body clothing?: A Lot 6 Click Score: 14    End of Session Equipment Utilized During Treatment: Gait belt;Rolling walker (2 wheels)  OT Visit Diagnosis: Unsteadiness on feet (R26.81);Muscle weakness (generalized) (M62.81);Other symptoms and signs involving cognitive function;Pain   Activity Tolerance Patient tolerated treatment well   Patient Left in chair;with call bell/phone within reach;with chair alarm set;Other (comment) (RN sitting outside room)   Nurse Communication Mobility status;Other (comment) (low BPs)        Time: 8575-8543 OT Time Calculation (min): 32 min  Charges: OT General Charges $OT Visit: 1 Visit OT Treatments $Self Care/Home Management : 8-22 mins $Therapeutic Activity: 8-22 mins  Elma JONETTA Lebron FREDERICK, OTR/L [facility] Acute Rehabilitation Office: [phone]   Elma JONETTA Lebron 02/25/2024, 5:01 PM

## 2024-02-25 NOTE — Progress Notes (Signed)
 Patient ID: [name], male   DOB: 1956/04/28, 68 y.o.   MRN: [id number] 19 Days Post-Op    Subjective: Labs pending. No complaints. Talkative and interactive this AM.  ROS negative except as listed above. Objective: Vital signs in last 24 hours: Temp:  [97.9 F (36.6 C)-98.5 F (36.9 C)] 98.2 F (36.8 C) (10/14 0400) Pulse Rate:  [94-110] 100 (10/14 0400) Resp:  [14-20] 20 (10/14 0400) BP: (102-144)/(60-95) 142/93 (10/14 0400) SpO2:  [94 %-99 %] 99 % (10/14 0400) FiO2 (%):  [21 %] 21 % (10/13 2021) Weight:  [73.1 kg] 73.1 kg (10/14 0400) Last BM Date : 02/24/24  Intake/Output from previous day: 10/13 0701 - 10/14 0700 In: 1331.9 [NG/GT:930; IV Piggyback:181.9] Out: 2580 [Urine:2000; Drains:30; Stool:550] Intake/Output this shift: Total I/O In: 1206.9 [Other:220; NG/GT:805; IV Piggyback:181.9] Out: 970 [Urine:700; Drains:20; Stool:250]  General appearance: alert and cooperative Resp: clear to auscultation bilaterally GI: soft, NT, J tube working, JP drain with minimal murky yellow/green fluid in bulb this AM, 30cc over last 24h  Lab Results: CBC  Recent Labs    02/23/24 0300 02/24/24 0530  WBC 12.8* 12.6*  HGB 9.8* 10.1*  HCT 30.9* 31.2*  PLT 573* 585*   BMET Recent Labs    02/24/24 0752  NA 133*  K 4.1  CL 94*  CO2 28  GLUCOSE 150*  BUN 29*  CREATININE 0.66  CALCIUM  9.2    PT/INR No results for input(s): LABPROT, INR in the last 72 hours. ABG No results for input(s): PHART, HCO3 in the last 72 hours.  Invalid input(s): PCO2, PO2  Studies/Results: No results found.  Anti-infectives: Anti-infectives (From admission, onward)    Start     Dose/Rate Route Frequency Ordered Stop   02/07/24 2000  vancomycin  (VANCOCIN ) IVPB 1000 mg/200 mL premix  Status:  Discontinued        1,000 mg 200 mL/hr over 60 Minutes Intravenous Every 12 hours 02/07/24 1229 02/08/24 0725   02/07/24 0915  vancomycin  (VANCOREADY) IVPB 1500 mg/300 mL         1,500 mg 150 mL/hr over 120 Minutes Intravenous  Once 02/07/24 0823 02/07/24 1125   02/02/24 0400  micafungin  (MYCAMINE ) 100 mg in sodium chloride  0.9 % 100 mL IVPB        100 mg 105 mL/hr over 1 Hours Intravenous Daily 02/02/24 0248 03/15/24 1400   02/02/24 0400  meropenem  (MERREM ) 1,000 mg in sodium chloride  0.9 % 100 mL IVPB        1,000 mg 200 mL/hr over 30 Minutes Intravenous Every 8 hours 02/02/24 0248 03/15/24 1400   02/02/24 0315  vancomycin  (VANCOCIN ) IVPB 1000 mg/200 mL premix        1,000 mg 200 mL/hr over 60 Minutes Intravenous  Once 02/02/24 0228 02/02/24 0510   01/31/24 1800  piperacillin -tazobactam (ZOSYN ) IVPB 3.375 g  Status:  Discontinued        3.375 g 12.5 mL/hr over 240 Minutes Intravenous Every 8 hours 01/31/24 1639 02/02/24 0248   01/31/24 1745  fluconazole  (DIFLUCAN ) IVPB 400 mg  Status:  Discontinued        400 mg 100 mL/hr over 120 Minutes Intravenous Every 24 hours 01/31/24 1659 02/02/24 0248   01/22/24 1000  fluconazole  (DIFLUCAN ) IVPB 400 mg       Placed in Followed by Linked Group   400 mg 100 mL/hr over 120 Minutes Intravenous Every 24 hours 01/21/24 0956 01/30/24 1134   01/21/24 1045  fluconazole  (DIFLUCAN ) IVPB 800 mg  Placed in Followed by Linked Group   800 mg 200 mL/hr over 120 Minutes Intravenous  Once 01/21/24 0956 01/21/24 1441   01/21/24 0945  piperacillin -tazobactam (ZOSYN ) IVPB 3.375 g        3.375 g 12.5 mL/hr over 240 Minutes Intravenous Every 8 hours 01/21/24 0920 01/30/24 2216   01/16/24 1510  ceFAZolin  (ANCEF ) 2-4 GM/100ML-% IVPB       Note to Pharmacy: Cindie Pizza: cabinet override      01/16/24 1510 01/17/24 0314   01/15/24 0645  ceFAZolin  (ANCEF ) IVPB 2g/100 mL premix       Placed in And Linked Group   2 g 200 mL/hr over 30 Minutes Intravenous On call to O.R. 01/15/24 0641 01/15/24 1700   01/15/24 0645  metroNIDAZOLE  (FLAGYL ) IVPB 500 mg       Placed in And Linked Group   500 mg 100 mL/hr over 60 Minutes  Intravenous On call to O.R. 01/15/24 9358 01/15/24 0910   01/15/24 9357  ceFAZolin  (ANCEF ) IVPB 2g/100 mL premix  Status:  Discontinued        2 g 200 mL/hr over 30 Minutes Intravenous 30 min pre-op 01/15/24 9357 01/15/24 0644       Assessment/Plan: [name] is a 68 yo male with gastric cardia adenocarcinoma, s/p total gastrectomy, with distal esophagectomy and roux-en-Y esophagojejunostomy on 9/3. S/p takeback by thoracic surgery for RATS repair of anastomotic leak on 9/4. S/p EGD with placement of esophageal stent on 9/11. S/p EGD with repositioning of esophageal stent 9/25.  - Has had respiratory arrest x2 associated with hypercarbia. Extubated 9/27, now on RA - ICU delirium: Will trial discontinuing clonidine  patch today - consider risperdal  ODT - Pain control: scheduled tylenol , prn oxycodone  and fentanyl . - Hypertension: home hydrochlorothiazide, will increase metop to 62.5mg  BID today given persistent mild sinus tachycardia   - EJ leak: S/p esophageal stent placement. Pleural JP to remain in place. On antibiotics and antifungal coverage per thoracic, will need 6 weeks total.  Thoracic potentially to replace stent in a few weeks. Will touch base with thoracic this week regarding this.  - FEN: Continue J tube feeds at goal. Patient is tolerating and having bowel function. FWF for hypernatremia - AM labs pending, will adjust FWF if needed based on Na today  - ID: On broad-spectrum abx and antifungal coverage (Merrem  and Micafungin ) for treatment of aspiration pneumonia and EJ leak. No good enteral options per pharmacy in addition to concern for TF interfering with absorption. - A-fib: remains in sinus rhythm. On therapeutic lovenox . - Code status: back to full code per patient wishes - Dispo: 4NP, PT recommending SNF at discharge, SW working on placement. Will need SNF that will accept patient receiving IV antibiotics and need to determine if any stent exchanged warranted by Thoracic  prior to discharge.   LOS: 41 days    Jon Silversmith, MD  Trauma & General Surgery  02/25/2024

## 2024-02-26 LAB — RENAL FUNCTION PANEL
Albumin: 2.1 g/dL — ABNORMAL LOW (ref 3.5–5.0)
Anion gap: 11 (ref 5–15)
BUN: 29 mg/dL — ABNORMAL HIGH (ref 8–23)
CO2: 26 mmol/L (ref 22–32)
Calcium: 9.3 mg/dL (ref 8.9–10.3)
Chloride: 96 mmol/L — ABNORMAL LOW (ref 98–111)
Creatinine, Ser: 0.69 mg/dL (ref 0.61–1.24)
GFR, Estimated: >60 mL/min (ref >60)
Glucose, Bld: 147 mg/dL — ABNORMAL HIGH (ref 70–99)
Phosphorus: 2.9 mg/dL (ref 2.5–4.6)
Potassium: 4.1 mmol/L (ref 3.5–5.1)
Sodium: 133 mmol/L — ABNORMAL LOW (ref 135–145)

## 2024-02-26 LAB — GLUCOSE, CAPILLARY
Glucose-Capillary: 123 mg/dL — ABNORMAL HIGH (ref 70–99)
Glucose-Capillary: 137 mg/dL — ABNORMAL HIGH (ref 70–99)
Glucose-Capillary: 140 mg/dL — ABNORMAL HIGH (ref 70–99)
Glucose-Capillary: 152 mg/dL — ABNORMAL HIGH (ref 70–99)
Glucose-Capillary: 160 mg/dL — ABNORMAL HIGH (ref 70–99)

## 2024-02-26 LAB — MAGNESIUM: Magnesium: 2 mg/dL (ref 1.7–2.4)

## 2024-02-26 MED ORDER — FREE WATER
30.0000 mL | Freq: Four times a day (QID) | Status: DC
  Administered 2024-02-26 – 2024-03-09 (×49): 30 mL

## 2024-02-26 MED ORDER — SODIUM CHLORIDE 1 G PO TABS
1.0000 g | ORAL_TABLET | Freq: Three times a day (TID) | ORAL | Status: DC
  Administered 2024-02-26 – 2024-02-27 (×5): 1 g via ORAL
  Filled 2024-02-26 (×5): qty 1

## 2024-02-26 NOTE — Progress Notes (Addendum)
 Patient ID: Jeffrey Graefe, male   DOB: Sep 27, 1955, 68 y.o.   MRN: [id number] 20 Days Post-Op    Subjective: No complaints. Talkative and interactive this AM.  ROS negative except as listed above. Objective: Vital signs in last 24 hours: Temp:  [97.6 F (36.4 C)-98.3 F (36.8 C)] 98.1 F (36.7 C) (10/15 0750) Pulse Rate:  [91-111] 107 (10/15 0750) Resp:  [18-21] 20 (10/15 0750) BP: (114-154)/(76-95) 154/95 (10/15 0750) SpO2:  [94 %-97 %] 96 % (10/15 0750) Weight:  [73.3 kg] 73.3 kg (10/15 0500) Last BM Date : 02/25/24  Intake/Output from previous day: 10/14 0701 - 10/15 0700 In: 1999.4 [I.V.:20; NG/GT:1524.4; IV Piggyback:405] Out: 375 [Urine:300; Drains:25; Stool:50] Intake/Output this shift: No intake/output data recorded.  General appearance: alert and cooperative Resp: clear to auscultation bilaterally GI: soft, NT, J tube working, JP drain with minimal murky yellow/green fluid in bulb this AM, 30cc over last 24h  Lab Results: CBC  Recent Labs    02/24/24 0530  WBC 12.6*  HGB 10.1*  HCT 31.2*  PLT 585*   BMET Recent Labs    02/25/24 0536 02/26/24 0604  NA 134* 133*  K 4.6 4.1  CL 94* 96*  CO2 29 26  GLUCOSE 121* 147*  BUN 28* 29*  CREATININE 0.65 0.69  CALCIUM  9.2 9.3    PT/INR No results for input(s): LABPROT, INR in the last 72 hours. ABG No results for input(s): PHART, HCO3 in the last 72 hours.  Invalid input(s): PCO2, PO2  Studies/Results: No results found.  Anti-infectives: Anti-infectives (From admission, onward)    Start     Dose/Rate Route Frequency Ordered Stop   02/07/24 2000  vancomycin  (VANCOCIN ) IVPB 1000 mg/200 mL premix  Status:  Discontinued        1,000 mg 200 mL/hr over 60 Minutes Intravenous Every 12 hours 02/07/24 1229 02/08/24 0725   02/07/24 0915  vancomycin  (VANCOREADY) IVPB 1500 mg/300 mL        1,500 mg 150 mL/hr over 120 Minutes Intravenous  Once 02/07/24 0823 02/07/24 1125   02/02/24 0400  micafungin   (MYCAMINE ) 100 mg in sodium chloride  0.9 % 100 mL IVPB        100 mg 105 mL/hr over 1 Hours Intravenous Daily 02/02/24 0248 03/15/24 1400   02/02/24 0400  meropenem  (MERREM ) 1,000 mg in sodium chloride  0.9 % 100 mL IVPB        1,000 mg 200 mL/hr over 30 Minutes Intravenous Every 8 hours 02/02/24 0248 03/15/24 1400   02/02/24 0315  vancomycin  (VANCOCIN ) IVPB 1000 mg/200 mL premix        1,000 mg 200 mL/hr over 60 Minutes Intravenous  Once 02/02/24 0228 02/02/24 0510   01/31/24 1800  piperacillin -tazobactam (ZOSYN ) IVPB 3.375 g  Status:  Discontinued        3.375 g 12.5 mL/hr over 240 Minutes Intravenous Every 8 hours 01/31/24 1639 02/02/24 0248   01/31/24 1745  fluconazole  (DIFLUCAN ) IVPB 400 mg  Status:  Discontinued        400 mg 100 mL/hr over 120 Minutes Intravenous Every 24 hours 01/31/24 1659 02/02/24 0248   01/22/24 1000  fluconazole  (DIFLUCAN ) IVPB 400 mg       Placed in Followed by Linked Group   400 mg 100 mL/hr over 120 Minutes Intravenous Every 24 hours 01/21/24 0956 01/30/24 1134   01/21/24 1045  fluconazole  (DIFLUCAN ) IVPB 800 mg       Placed in Followed by Linked Group   800 mg 200  mL/hr over 120 Minutes Intravenous  Once 01/21/24 0956 01/21/24 1441   01/21/24 0945  piperacillin -tazobactam (ZOSYN ) IVPB 3.375 g        3.375 g 12.5 mL/hr over 240 Minutes Intravenous Every 8 hours 01/21/24 0920 01/30/24 2216   01/16/24 1510  ceFAZolin  (ANCEF ) 2-4 GM/100ML-% IVPB       Note to Pharmacy: Cindie Pizza: cabinet override      01/16/24 1510 01/17/24 0314   01/15/24 0645  ceFAZolin  (ANCEF ) IVPB 2g/100 mL premix       Placed in And Linked Group   2 g 200 mL/hr over 30 Minutes Intravenous On call to O.R. 01/15/24 0641 01/15/24 1700   01/15/24 0645  metroNIDAZOLE  (FLAGYL ) IVPB 500 mg       Placed in And Linked Group   500 mg 100 mL/hr over 60 Minutes Intravenous On call to O.R. 01/15/24 9358 01/15/24 0910   01/15/24 9357  ceFAZolin  (ANCEF ) IVPB 2g/100 mL premix   Status:  Discontinued        2 g 200 mL/hr over 30 Minutes Intravenous 30 min pre-op 01/15/24 9357 01/15/24 0644       Assessment/Plan: Mr. Iles is a 68 yo male with gastric cardia adenocarcinoma, s/p total gastrectomy, with distal esophagectomy and roux-en-Y esophagojejunostomy on 9/3. S/p takeback by thoracic surgery for RATS repair of anastomotic leak on 9/4. S/p EGD with placement of esophageal stent on 9/11. S/p EGD with repositioning of esophageal stent 9/25.  - Has had respiratory arrest x2 associated with hypercarbia. Extubated 9/27, now on RA - Clonidine  patch discontinued, consider risperdal  ODT - Pain control: scheduled tylenol , prn oxycodone  and fentanyl . - Hypertension: home hydrochlorothiazide, metoprolol  62.5mg  BID   - EJ leak: S/p esophageal stent placement. Pleural JP to remain in place. On antibiotics and antifungal coverage per thoracic, will need 6 weeks total.  Thoracic potentially to replace stent at 6 week mark ( which would be around 10/23 if counting from initial placement)  - FEN: Continue J tube feeds at goal. Patient is tolerating and having bowel function. FWF for sodium balance- Change interval from q4hrs to q6hrs due to persistent mild hyponatremia  - ID: On broad-spectrum abx and antifungal coverage (Merrem  and Micafungin ) for treatment of aspiration pneumonia and EJ leak. No good enteral options per pharmacy in addition to concern for TF interfering with absorption. - A-fib: remains in sinus rhythm. On therapeutic lovenox . - Code status: back to full code per patient wishes - Dispo: 4NP, PT recommending SNF at discharge, SW working on placement. Will need SNF that will accept patient receiving IV antibiotics and need to determine if any stent exchanged warranted by Thoracic prior to discharge.   LOS: 42 days    Deward JINNY Foy, MD  Trauma & General Surgery  02/26/2024

## 2024-02-26 NOTE — Progress Notes (Signed)
 Physical Therapy Treatment Patient Details Name: [name] MRN: [id number] DOB: [birth year] Today's Date: 02/26/2024   History of Present Illness 68 y.o. male presents to [facility] hospital on 01/15/2024 with gastric CA. Pt underwent total gastrectomy with omentectomy and D2 lymphadenectomy, esophagojejunostomy with Jtube placement, splenic biopsy on 9/3. Pt developed bilious output into chest tube on 9/4, returned to OR for EGD and thoracoscopy with application of wound matrix to EJ anastomosis. ETT 9/11-9/12 for desats and unresponsiveness. S/p EGD and esophageal stent placement 9/11. Cardiac arrest 9/19. Re-ett 9/19-9/22. 9/26 PEA respiratory failure with intubation extubation 9/27.  Psychiatry evaluation to determine competency and per MD note, at time of evaluation, patient lacked medical decisional capacity regarding disposition... Recommend speaking with HCPOA regarding any dispositional planning and possibly consult palliative care to clarify GOC. PMH includes asthma, OA, COPD, GERD, DMII, MDD, emphysema.    PT Comments  Pt received in supine, lethargic and oriented to location/situation but not fully to self/time and inconsistent following of commands this date. Clearance for session per RN given lethargy, pt appears to have waxing/waning alertness and delirium past few days, given slow progress toward goals, he would benefit from PT at [facility]), discussed with pt and supervising PT Anessa G. Pt needing maxA for bed mobility/repositioning into bed chair posture. RN notified pt would benefit from air bed due to high risk of pressure injury with current mentation and flexi-seal, as well as keeping HOB locked to prevent HOB <30 degrees with J-tube. Patient will benefit from continued inpatient follow up therapy, <3 hours/day at [facility] if able.    If plan is discharge home, recommend the following: A lot of help with bathing/dressing/bathroom;Assistance with  cooking/housework;Direct supervision/assist for medications management;Direct supervision/assist for financial management;Assist for transportation;Help with stairs or ramp for entrance;Two people to help with walking and/or transfers;Supervision due to cognitive status   Can travel by private vehicle     No  Equipment Recommendations  Wheelchair (measurements PT);Wheelchair cushion (measurements PT);BSC/3in1;Rolling walker (2 wheels) (may need hospital air bed, pending progress)    Recommendations for Other Services       Precautions / Restrictions Precautions Precautions: Fall Recall of Precautions/Restrictions: Impaired Precaution/Restrictions Comments: R JP drain x 1, J tube, R port, flexiseal, mittens Restrictions Weight Bearing Restrictions Per Provider Order: No     Mobility  Bed Mobility Overal bed mobility: Needs Assistance Bed Mobility: Rolling, Supine to Sit Rolling: Min assist   Supine to sit: Max assist, HOB elevated, Used rails     General bed mobility comments: minA for partial roll, pt refusing to sit EOB and does not open eyes, so instead PTA raised bed rails and had him work on bed chair posture to long sitting a few reps to reposition pillows behind him and encourage upright posture in hopes of pt becoming more alert, but pt maintains eyes closed throughout, although able to respond verbally (not very accurately) to most prompts.    Transfers Overall transfer level: Needs assistance                 General transfer comment: unable to assess, pt very lethargic; per NT, pt had been up to chair recently and gotten back to bed, with +2 HHA for safety.    Ambulation/Gait                   Comptroller  Bed    Modified Rankin (Stroke Patients Only)       Balance Overall balance assessment: Needs assistance Sitting-balance support: Single extremity supported, Feet unsupported Sitting  balance-Leahy Scale: Poor Sitting balance - Comments: likely 2/2 lethargy today       Standing balance comment: pt too lethargic to attempt and refuses to try                            Communication Communication Communication: Impaired Factors Affecting Communication: Reduced clarity of speech;Difficulty expressing self  Cognition Arousal: Lethargic Behavior During Therapy: Flat affect   PT - Cognitive impairments: Orientation, Awareness, Memory, Problem solving, Safety/Judgement, Attention, Initiation, Sequencing Difficult to assess due to: Impaired communication, Level of arousal Orientation impairments: Person                   PT - Cognition Comments: Pt appears more confused, refusing to open eyes, when encouraged to participate more in mobility, pt states you're trying to kill me, when asked what his shih tzu dog's name is, pt states shih tzu, pt does not state his DOB when asked, then when PTA states his DOB for him to reinforce orientation, pt randomly repeats it a couple times out of context. Poor attention to task. Pt states hospital and cone to location, cancer to reason for admission, does not appear oriented to time of day. Following commands: Impaired Following commands impaired: Follows one step commands inconsistently    Cueing Cueing Techniques: Verbal cues, Tactile cues  Exercises General Exercises - Lower Extremity Ankle Circles/Pumps: AROM, AAROM, Both, 10 reps, Supine Long Arc Quad: AROM, AAROM, Both, 10 reps, Supine (AA for improved ROM) Heel Slides: AROM, AAROM, 10 reps, Supine, Left, Right (AA for improved ROM/carryover of cues) Hip Flexion/Marching: AROM, AAROM, Left, Right, 10 reps, Other (comment) (bed in chair posture, AA for increased ROM) Other Exercises Other Exercises: elevated HOB to long sitting x3 reps pulling on bil side rails, cues for use of UE to reduce abd strain and trunk supported; pillows adjusted while pt long  sitting; does not maintain without assist today    General Comments        Pertinent Vitals/Pain Pain Assessment Pain Assessment: PAINAD Breathing: normal Negative Vocalization: occasional moan/groan, low speech, negative/disapproving quality Facial Expression: facial grimacing Body Language: relaxed Consolability: distracted or reassured by voice/touch PAINAD Score: 4 Pain Location: generalized, back and abdomen Pain Descriptors / Indicators: Sore, Grimacing, Guarding Pain Intervention(s): Limited activity within patient's tolerance, Monitored during session, Repositioned    Home Living                          Prior Function            PT Goals (current goals can now be found in the care plan section) Acute Rehab PT Goals Patient Stated Goal: to return to independence PT Goal Formulation: With patient Time For Goal Achievement: 03/02/24 Progress towards PT goals: Not progressing toward goals - comment    Frequency    Min 2X/week      PT Plan      Co-evaluation              AM-PAC PT 6 Clicks Mobility   Outcome Measure  Help needed turning from your back to your side while in a flat bed without using bedrails?: A Little Help needed moving from lying on your back to sitting on  the side of a flat bed without using bedrails?: A Lot Help needed moving to and from a bed to a chair (including a wheelchair)?: A Lot Help needed standing up from a chair using your arms (e.g., wheelchair or bedside chair)?: A Lot Help needed to walk in hospital room?: Total (2/2 lethargy) Help needed climbing 3-5 steps with a railing? : Total 6 Click Score: 11    End of Session   Activity Tolerance: Patient limited by lethargy;Patient limited by pain Patient left: in bed;with call bell/phone within reach;with bed alarm set;Other (comment) (mitts re-donned; posey lap belt re-donned in bed, HOB 62 deg to promote alertness and reduce risk of aspiration given  j-tube) Nurse Communication: Mobility status;Precautions;Other (comment) (needs HOB locked to >30 deg, would benefit from air bed given flexi-seal and pt not able to consistently relieve pressure on his own) PT Visit Diagnosis: Other abnormalities of gait and mobility (R26.89);Muscle weakness (generalized) (M62.81);Pain Pain - part of body:  (generalized; abdomen)     Time: 8343-8282 PT Time Calculation (min) (ACUTE ONLY): 21 min  Charges:    $Therapeutic Exercise: 8-22 mins PT General Charges $$ ACUTE PT VISIT: 1 Visit                     Eisa Necaise P., PTA Acute Rehabilitation Services Secure Chat Preferred 9a-5:30pm Office: [phone]    Connell HERO [facility] 02/26/2024, 5:51 PM

## 2024-02-26 NOTE — Plan of Care (Signed)
  Problem: Education: Goal: Ability to describe self-care measures that may prevent or decrease complications (Diabetes Survival Skills Education) will improve Outcome: Progressing Goal: Individualized Educational Video(s) Outcome: Progressing   Problem: Coping: Goal: Ability to adjust to condition or change in health will improve Outcome: Progressing   Problem: Fluid Volume: Goal: Ability to maintain a balanced intake and output will improve Outcome: Progressing   Problem: Health Behavior/Discharge Planning: Goal: Ability to identify and utilize available resources and services will improve Outcome: Progressing Goal: Ability to manage health-related needs will improve Outcome: Progressing   Problem: Metabolic: Goal: Ability to maintain appropriate glucose levels will improve Outcome: Progressing   Problem: Nutritional: Goal: Maintenance of adequate nutrition will improve Outcome: Progressing Goal: Progress toward achieving an optimal weight will improve Outcome: Progressing   Problem: Skin Integrity: Goal: Risk for impaired skin integrity will decrease Outcome: Progressing   Problem: Tissue Perfusion: Goal: Adequacy of tissue perfusion will improve Outcome: Progressing   Problem: Education: Goal: Knowledge of General Education information will improve Description: Including pain rating scale, medication(s)/side effects and non-pharmacologic comfort measures Outcome: Progressing   Problem: Clinical Measurements: Goal: Will remain free from infection Outcome: Progressing Goal: Respiratory complications will improve Outcome: Progressing   Problem: Activity: Goal: Risk for activity intolerance will decrease Outcome: Progressing   Problem: Pain Managment: Goal: General experience of comfort will improve and/or be controlled Outcome: Progressing   Problem: Safety: Goal: Ability to remain free from injury will improve Outcome: Progressing

## 2024-02-26 NOTE — Plan of Care (Signed)
   Problem: Metabolic: Goal: Ability to maintain appropriate glucose levels will improve Outcome: Progressing   Problem: Skin Integrity: Goal: Risk for impaired skin integrity will decrease Outcome: Progressing   Problem: Education: Goal: Knowledge of General Education information will improve Description: Including pain rating scale, medication(s)/side effects and non-pharmacologic comfort measures Outcome: Progressing

## 2024-02-27 LAB — GLUCOSE, CAPILLARY
Glucose-Capillary: 113 mg/dL — ABNORMAL HIGH (ref 70–99)
Glucose-Capillary: 151 mg/dL — ABNORMAL HIGH (ref 70–99)
Glucose-Capillary: 152 mg/dL — ABNORMAL HIGH (ref 70–99)
Glucose-Capillary: 105 mg/dL — ABNORMAL HIGH (ref 70–99)

## 2024-02-27 LAB — RENAL FUNCTION PANEL
Albumin: 2 g/dL — ABNORMAL LOW (ref 3.5–5.0)
Anion gap: 9 (ref 5–15)
BUN: 29 mg/dL — ABNORMAL HIGH (ref 8–23)
CO2: 28 mmol/L (ref 22–32)
Calcium: 9 mg/dL (ref 8.9–10.3)
Chloride: 99 mmol/L (ref 98–111)
Creatinine, Ser: 0.62 mg/dL (ref 0.61–1.24)
GFR, Estimated: >60 mL/min (ref >60)
Glucose, Bld: 117 mg/dL — ABNORMAL HIGH (ref 70–99)
Phosphorus: 3.1 mg/dL (ref 2.5–4.6)
Potassium: 4.1 mmol/L (ref 3.5–5.1)
Sodium: 136 mmol/L (ref 135–145)

## 2024-02-27 LAB — CBC
HCT: 32 % — ABNORMAL LOW (ref 39.0–52.0)
Hemoglobin: 10.2 g/dL — ABNORMAL LOW (ref 13.0–17.0)
MCH: 28.9 pg (ref 26.0–34.0)
MCHC: 31.9 g/dL (ref 30.0–36.0)
MCV: 90.7 fL (ref 80.0–100.0)
Platelets: 574 K/uL — ABNORMAL HIGH (ref 150–400)
RBC: 3.53 MIL/uL — ABNORMAL LOW (ref 4.22–5.81)
RDW: 16.3 % — ABNORMAL HIGH (ref 11.5–15.5)
WBC: 13.7 K/uL — ABNORMAL HIGH (ref 4.0–10.5)
nRBC: 0 % (ref 0.0–0.2)

## 2024-02-27 LAB — MAGNESIUM: Magnesium: 2 mg/dL (ref 1.7–2.4)

## 2024-02-27 NOTE — TOC Progression Note (Signed)
 Transition of Care (TOC) - Progression Note  Rayfield Gobble RN,BSN Inpatient Care Management Unit 4NP (Non Trauma)- RN Case Manager See Treatment Team for direct Phone #   Patient Details  Name: [name] MRN: [id number] Date of Birth: 30-Sep-1955  Transition of Care [facility]) CM/SW Contact  Gobble Rayfield Hurst, RN Phone Number: 02/27/2024, 2:53 PM  Clinical Narrative:    Follow done with pt's sister [name] Amble regarding LTACH. Brother toured Physiological scientist this am.  Per TC with [name] Amble she confirms that they want to move forward with LTACH, and submitting for insurance auth to see if he will get approved for [employer].   CM has updated [employer] liaison who will follow up and start insurance auth for [facility].    Expected Discharge Plan: Long Term Acute Care (LTAC) Barriers to Discharge: Continued Medical Work up, English as a second language teacher, SNF Pending bed offer               Expected Discharge Plan and Services     Post Acute Care Choice: Long Term Acute Care (LTAC) Living arrangements for the past 2 months: Single Family Home                                       Social Drivers of Health (SDOH) Interventions SDOH Screenings   Food Insecurity: No Food Insecurity (01/28/2024)  Housing: Low Risk  (01/28/2024)  Transportation Needs: No Transportation Needs (01/28/2024)  Utilities: Not At Risk (01/28/2024)  Depression (PHQ2-9): Low Risk  (11/25/2023)  Financial Resource Strain: Low Risk  (08/20/2023)  Social Connections: Unknown (01/28/2024)  Stress: No Stress Concern Present (08/20/2023)  Tobacco Use: High Risk (01/28/2024)    Readmission Risk Interventions    11/05/2023   12:23 PM  Readmission Risk Prevention Plan  Transportation Screening Complete  PCP or Specialist Appt within 3-5 Days Complete  HRI or Home Care Consult Complete  Social Work Consult for Recovery Care Planning/Counseling Complete  Palliative Care Screening Complete  Medication Review Oceanographer)  Complete

## 2024-02-27 NOTE — Plan of Care (Signed)
  Problem: Education: Goal: Ability to describe self-care measures that may prevent or decrease complications (Diabetes Survival Skills Education) will improve Outcome: Progressing Goal: Individualized Educational Video(s) Outcome: Progressing   Problem: Coping: Goal: Ability to adjust to condition or change in health will improve Outcome: Progressing   Problem: Fluid Volume: Goal: Ability to maintain a balanced intake and output will improve Outcome: Progressing   Problem: Health Behavior/Discharge Planning: Goal: Ability to identify and utilize available resources and services will improve Outcome: Progressing Goal: Ability to manage health-related needs will improve Outcome: Progressing   Problem: Metabolic: Goal: Ability to maintain appropriate glucose levels will improve Outcome: Progressing   Problem: Nutritional: Goal: Maintenance of adequate nutrition will improve Outcome: Progressing Goal: Progress toward achieving an optimal weight will improve Outcome: Progressing   Problem: Skin Integrity: Goal: Risk for impaired skin integrity will decrease Outcome: Progressing   Problem: Tissue Perfusion: Goal: Adequacy of tissue perfusion will improve Outcome: Progressing   Problem: Education: Goal: Knowledge of General Education information will improve Description: Including pain rating scale, medication(s)/side effects and non-pharmacologic comfort measures Outcome: Progressing   Problem: Clinical Measurements: Goal: Will remain free from infection Outcome: Progressing Goal: Respiratory complications will improve Outcome: Progressing   Problem: Activity: Goal: Risk for activity intolerance will decrease Outcome: Progressing   Problem: Pain Managment: Goal: General experience of comfort will improve and/or be controlled Outcome: Progressing   Problem: Safety: Goal: Ability to remain free from injury will improve Outcome: Progressing

## 2024-02-27 NOTE — Progress Notes (Signed)
   02/27/24 2039  Family/Significant Other Communication  Family/Significant Other Update  Ms.[name] Eagles, the health care power of attorney, called by phone.Update was given. All questions were answered. Pt's family member expressed her appreciations.    Wendi Dash, RN

## 2024-02-27 NOTE — Progress Notes (Signed)
 Pt is alert, oriented x 2, cooperative, able to follow simple commands, afebrile, stable hemodynamically, on  room air, normal respiratory effort. Pt is able to rest well overnight with no major complaints. No acute distress noted overnight. Plan of care is reviewed. Pt has been progressing. We will continue to monitor.   Wendi Dash, RN

## 2024-02-27 NOTE — Progress Notes (Addendum)
 Patient ID: [name], male   DOB: [birth year], 68 y.o.   MRN: [id number] 21 Days Post-Op    Subjective/Interval: Patient feels a little dizzy. Is up in chair. Mitts on, per bedside RN was pulling at tubes earlier. WBC slightly up to 13.7 from 12.6 on Monday.  ROS negative except as listed above. Objective: Vital signs in last 24 hours: Temp:  [98 F (36.7 C)-98.3 F (36.8 C)] 98 F (36.7 C) (10/16 0740) Pulse Rate:  [94-111] 105 (10/16 0740) Resp:  [17-22] 20 (10/16 0824) BP: (134-167)/(65-117) 149/95 (10/16 0740) SpO2:  [94 %-99 %] 97 % (10/16 0740) Weight:  [72.7 kg] 72.7 kg (10/16 0500) Last BM Date : 02/27/24  Intake/Output from previous day: 10/15 0701 - 10/16 0700 In: 2439 [NG/GT:1824; IV Piggyback:405] Out: 560 [Urine:550; Drains:10] Intake/Output this shift: Total I/O In: 531.8 [Other:30; NG/GT:298.8; IV Piggyback:203] Out: -   General appearance: alert and cooperative Resp: clear to auscultation bilaterally, JP drain with minimal murky yellow/green fluid in bulb, 10cc over last 24h GI: soft, NT, J tube working Lab Results: CBC  Recent Labs    02/27/24 0913  WBC 13.7*  HGB 10.2*  HCT 32.0*  PLT 574*   BMET Recent Labs    02/26/24 0604 02/27/24 0602  NA 133* 136  K 4.1 4.1  CL 96* 99  CO2 26 28  GLUCOSE 147* 117*  BUN 29* 29*  CREATININE 0.69 0.62  CALCIUM  9.3 9.0    PT/INR No results for input(s): LABPROT, INR in the last 72 hours. ABG No results for input(s): PHART, HCO3 in the last 72 hours.  Invalid input(s): PCO2, PO2  Studies/Results: No results found.  Anti-infectives: Anti-infectives (From admission, onward)    Start     Dose/Rate Route Frequency Ordered Stop   02/07/24 2000  vancomycin  (VANCOCIN ) IVPB 1000 mg/200 mL premix  Status:  Discontinued        1,000 mg 200 mL/hr over 60 Minutes Intravenous Every 12 hours 02/07/24 1229 02/08/24 0725   02/07/24 0915  vancomycin  (VANCOREADY) IVPB 1500 mg/300 mL        1,500  mg 150 mL/hr over 120 Minutes Intravenous  Once 02/07/24 0823 02/07/24 1125   02/02/24 0400  micafungin  (MYCAMINE ) 100 mg in sodium chloride  0.9 % 100 mL IVPB        100 mg 105 mL/hr over 1 Hours Intravenous Daily 02/02/24 0248 03/15/24 1400   02/02/24 0400  meropenem  (MERREM ) 1,000 mg in sodium chloride  0.9 % 100 mL IVPB        1,000 mg 200 mL/hr over 30 Minutes Intravenous Every 8 hours 02/02/24 0248 03/15/24 1400   02/02/24 0315  vancomycin  (VANCOCIN ) IVPB 1000 mg/200 mL premix        1,000 mg 200 mL/hr over 60 Minutes Intravenous  Once 02/02/24 0228 02/02/24 0510   01/31/24 1800  piperacillin -tazobactam (ZOSYN ) IVPB 3.375 g  Status:  Discontinued        3.375 g 12.5 mL/hr over 240 Minutes Intravenous Every 8 hours 01/31/24 1639 02/02/24 0248   01/31/24 1745  fluconazole  (DIFLUCAN ) IVPB 400 mg  Status:  Discontinued        400 mg 100 mL/hr over 120 Minutes Intravenous Every 24 hours 01/31/24 1659 02/02/24 0248   01/22/24 1000  fluconazole  (DIFLUCAN ) IVPB 400 mg       Placed in Followed by Linked Group   400 mg 100 mL/hr over 120 Minutes Intravenous Every 24 hours 01/21/24 0956 01/30/24 1134   01/21/24 1045  fluconazole  (DIFLUCAN ) IVPB 800 mg       Placed in Followed by Linked Group   800 mg 200 mL/hr over 120 Minutes Intravenous  Once 01/21/24 0956 01/21/24 1441   01/21/24 0945  piperacillin -tazobactam (ZOSYN ) IVPB 3.375 g        3.375 g 12.5 mL/hr over 240 Minutes Intravenous Every 8 hours 01/21/24 0920 01/30/24 2216   01/16/24 1510  ceFAZolin  (ANCEF ) 2-4 GM/100ML-% IVPB       Note to Pharmacy: Cindie Pizza: cabinet override      01/16/24 1510 01/17/24 0314   01/15/24 0645  ceFAZolin  (ANCEF ) IVPB 2g/100 mL premix       Placed in And Linked Group   2 g 200 mL/hr over 30 Minutes Intravenous On call to O.R. 01/15/24 0641 01/15/24 1700   01/15/24 0645  metroNIDAZOLE  (FLAGYL ) IVPB 500 mg       Placed in And Linked Group   500 mg 100 mL/hr over 60 Minutes Intravenous On  call to O.R. 01/15/24 9358 01/15/24 0910   01/15/24 9357  ceFAZolin  (ANCEF ) IVPB 2g/100 mL premix  Status:  Discontinued        2 g 200 mL/hr over 30 Minutes Intravenous 30 min pre-op 01/15/24 9357 01/15/24 0644       Assessment/Plan: [name] is a 68 yo male with gastric cardia adenocarcinoma, s/p total gastrectomy, with distal esophagectomy and roux-en-Y esophagojejunostomy on 9/3. S/p takeback by thoracic surgery for RATS repair of anastomotic leak on 9/4. S/p EGD with placement of esophageal stent on 9/11. S/p EGD with repositioning of esophageal stent 9/25.  - Has had respiratory arrest x2 associated with hypercarbia. Extubated 9/27, now on RA - Clonidine  patch discontinued, consider risperdal  ODT - Pain control: scheduled tylenol , prn oxycodone . Discontinue fentanyl , not used since Monday. - Hypertension: home hydrochlorothiazide, metoprolol  62.5mg  BID   - EJ leak: S/p esophageal stent placement. Pleural JP to remain in place. On antibiotics and antifungal coverage per thoracic, will need 6 weeks total.  Thoracic planning for stent exchange at 6 weeks. Okay for discharge to [facility] prior to stent exchange if accepted.  - FEN: Continue J tube feeds at goal. Patient is tolerating and having bowel function. FWF interval now 30q6h given hyponatremia after initial correction of hypernatremia.  - ID: On broad-spectrum abx and antifungal coverage (Merrem  and Micafungin ) for treatment of aspiration pneumonia and EJ leak. No good enteral options per pharmacy in addition to concern for TF interfering with absorption. Will repeat AM CBC to ensure WBC not continuing to trend up. Afebrile. - A-fib: remains in sinus rhythm. On therapeutic lovenox . - Code status: back to full code per patient wishes - Dispo: 4NP, PT recommending SNF at discharge, SW working on placement. LTACH likely at discharge given IV antibiotics and then SNF.    LOS: 43 days    Jon Silversmith, MD  Trauma & General  Surgery  02/27/2024

## 2024-02-28 LAB — RENAL FUNCTION PANEL
Albumin: 2.1 g/dL — ABNORMAL LOW (ref 3.5–5.0)
Anion gap: 9 (ref 5–15)
BUN: 31 mg/dL — ABNORMAL HIGH (ref 8–23)
CO2: 28 mmol/L (ref 22–32)
Calcium: 9.3 mg/dL (ref 8.9–10.3)
Chloride: 99 mmol/L (ref 98–111)
Creatinine, Ser: 0.67 mg/dL (ref 0.61–1.24)
GFR, Estimated: >60 mL/min (ref >60)
Glucose, Bld: 132 mg/dL — ABNORMAL HIGH (ref 70–99)
Phosphorus: 3.4 mg/dL (ref 2.5–4.6)
Potassium: 4 mmol/L (ref 3.5–5.1)
Sodium: 136 mmol/L (ref 135–145)

## 2024-02-28 LAB — CBC
HCT: 31.7 % — ABNORMAL LOW (ref 39.0–52.0)
Hemoglobin: 10.2 g/dL — ABNORMAL LOW (ref 13.0–17.0)
MCH: 29.1 pg (ref 26.0–34.0)
MCHC: 32.2 g/dL (ref 30.0–36.0)
MCV: 90.6 fL (ref 80.0–100.0)
Platelets: 583 K/uL — ABNORMAL HIGH (ref 150–400)
RBC: 3.5 MIL/uL — ABNORMAL LOW (ref 4.22–5.81)
RDW: 16.5 % — ABNORMAL HIGH (ref 11.5–15.5)
WBC: 13 K/uL — ABNORMAL HIGH (ref 4.0–10.5)
nRBC: 0 % (ref 0.0–0.2)

## 2024-02-28 LAB — GLUCOSE, CAPILLARY
Glucose-Capillary: 118 mg/dL — ABNORMAL HIGH (ref 70–99)
Glucose-Capillary: 129 mg/dL — ABNORMAL HIGH (ref 70–99)
Glucose-Capillary: 165 mg/dL — ABNORMAL HIGH (ref 70–99)

## 2024-02-28 LAB — MAGNESIUM: Magnesium: 2 mg/dL (ref 1.7–2.4)

## 2024-02-28 NOTE — Progress Notes (Signed)
 Physical Therapy Treatment Patient Details Name: [name] MRN: [id number] DOB: July 14, 1955 Today's Date: 02/28/2024   History of Present Illness 68 y.o. male presents to [facility] hospital on 01/15/2024 with gastric CA. Pt underwent total gastrectomy with omentectomy and D2 lymphadenectomy, esophagojejunostomy with Jtube placement, splenic biopsy on 9/3. Pt developed bilious output into chest tube on 9/4, returned to OR for EGD and thoracoscopy with application of wound matrix to EJ anastomosis. ETT 9/11-9/12 for desats and unresponsiveness. S/p EGD and esophageal stent placement 9/11. Cardiac arrest 9/19. Re-ett 9/19-9/22. 9/26 PEA respiratory failure with intubation extubation 9/27.  Psychiatry evaluation to determine competency and per MD note, at time of evaluation, patient lacked medical decisional capacity regarding disposition... Recommend speaking with HCPOA regarding any dispositional planning and possibly consult palliative care to clarify GOC. PMH includes asthma, OA, COPD, GERD, DMII, MDD, emphysema.    PT Comments  Pt up in chair upon arrival to room, endorsing ready to get back to bed even though he has only been up x1 hour. Pt overall mobilizing at min A level for transfer and short-distance gait in room, pt demonstrating debility and fatigability but progress vs previous session. Pt limited today by leaking flexiseal, tolerated standing x10 minutes for gait and wash up given flexiseal leaking. PT to progress mobility as tolerated, will continue to follow.      If plan is discharge home, recommend the following: A lot of help with bathing/dressing/bathroom;Assistance with cooking/housework;Direct supervision/assist for medications management;Direct supervision/assist for financial management;Assist for transportation;Help with stairs or ramp for entrance;Supervision due to cognitive status;A lot of help with walking and/or transfers   Can travel by private vehicle        Equipment  Recommendations  Wheelchair (measurements PT);Wheelchair cushion (measurements PT);BSC/3in1;Rolling walker (2 wheels)    Recommendations for Other Services       Precautions / Restrictions Precautions Precautions: Fall Precaution/Restrictions Comments: R JP drain x 1, J tube, R port, flexiseal, mittens Restrictions Weight Bearing Restrictions Per Provider Order: No     Mobility  Bed Mobility               General bed mobility comments: up in chair    Transfers Overall transfer level: Needs assistance Equipment used: Rolling walker (2 wheels) Transfers: Sit to/from Stand Sit to Stand: Min assist           General transfer comment: assist for power up, rise, steadying. stand x1    Ambulation/Gait Ambulation/Gait assistance: Min assist Gait Distance (Feet): 15 Feet Assistive device: Rolling walker (2 wheels) Gait Pattern/deviations: Step-through pattern, Trunk flexed Gait velocity: decr     General Gait Details: assist to steady and guide RW, cues for widening BOS and upright posture. standing tolerance x10 minutes for gait and wash up as pt's flexiseal had heavily leaked   Stairs             Wheelchair Mobility     Tilt Bed    Modified Rankin (Stroke Patients Only)       Balance Overall balance assessment: Needs assistance Sitting-balance support: Single extremity supported, Feet unsupported Sitting balance-Leahy Scale: Fair     Standing balance support: Bilateral upper extremity supported, During functional activity Standing balance-Leahy Scale: Poor Standing balance comment: reliant on external support                            Communication Communication Communication: Impaired Factors Affecting Communication: Reduced clarity of speech;Difficulty expressing self  Cognition Arousal: Alert Behavior During Therapy: WFL for tasks assessed/performed   PT - Cognitive impairments: Awareness, Memory, Problem solving,  Safety/Judgement, Attention, Initiation, Sequencing Difficult to assess due to: Impaired communication, Level of arousal                       Following commands: Impaired Following commands impaired: Follows one step commands inconsistently, Follows one step commands with increased time    Cueing Cueing Techniques: Verbal cues, Tactile cues  Exercises      General Comments        Pertinent Vitals/Pain Pain Assessment Pain Assessment: Faces Faces Pain Scale: Hurts a little bit Pain Location: back, buttocks Pain Descriptors / Indicators: Discomfort Pain Intervention(s): Monitored during session, Limited activity within patient's tolerance, Repositioned    Home Living                          Prior Function            PT Goals (current goals can now be found in the care plan section) Acute Rehab PT Goals Patient Stated Goal: to return to independence PT Goal Formulation: With patient Time For Goal Achievement: 03/02/24 Potential to Achieve Goals: Fair Progress towards PT goals: Progressing toward goals    Frequency    Min 2X/week      PT Plan      Co-evaluation              AM-PAC PT 6 Clicks Mobility   Outcome Measure  Help needed turning from your back to your side while in a flat bed without using bedrails?: A Little Help needed moving from lying on your back to sitting on the side of a flat bed without using bedrails?: A Lot Help needed moving to and from a bed to a chair (including a wheelchair)?: A Lot Help needed standing up from a chair using your arms (e.g., wheelchair or bedside chair)?: A Little Help needed to walk in hospital room?: A Lot Help needed climbing 3-5 steps with a railing? : Total 6 Click Score: 13    End of Session Equipment Utilized During Treatment: Other (comment) (abd binder) Activity Tolerance: Patient limited by lethargy;Patient limited by pain Patient left: in bed;with call bell/phone within  reach;with bed alarm set;with nursing/sitter in room (sitting EOB with staff for finishing bath) Nurse Communication: Mobility status PT Visit Diagnosis: Other abnormalities of gait and mobility (R26.89);Muscle weakness (generalized) (M62.81);Pain     Time: 8789-8764 PT Time Calculation (min) (ACUTE ONLY): 25 min  Charges:    $Gait Training: 8-22 mins $Therapeutic Activity: 8-22 mins PT General Charges $$ ACUTE PT VISIT: 1 Visit                    [name], PT DPT Acute Rehabilitation Services Secure Chat Preferred  Office [phone]    Shemika Robbs E Johna 02/28/2024, 3:24 PM

## 2024-02-28 NOTE — Progress Notes (Signed)
 Pt is alert, oriented x 2, cooperative, able to follow simple commands, afebrile, stable hemodynamically, on  room air, normal respiratory effort. Pt is able to rest well overnight with no major complaints. No acute distress noted overnight. Plan of care is reviewed. Pt has been progressing. We will continue to monitor.    Problem: Health Behavior/Discharge Planning: Goal: Ability to identify and utilize available resources and services will improve Outcome: Progressing Goal: Ability to manage health-related needs will improve Outcome: Progressing   Problem: Metabolic: Goal: Ability to maintain appropriate glucose levels will improve Outcome: Progressing   Problem: Nutritional: Goal: Maintenance of adequate nutrition will improve Outcome: Progressing Goal: Progress toward achieving an optimal weight will improve Outcome: Progressing   Problem: Skin Integrity: Goal: Risk for impaired skin integrity will decrease Outcome: Progressing   Problem: Tissue Perfusion: Goal: Adequacy of tissue perfusion will improve Outcome: Progressing   Problem: Clinical Measurements: Goal: Will remain free from infection Outcome: Progressing Goal: Respiratory complications will improve Outcome: Progressing   Problem: Pain Managment: Goal: General experience of comfort will improve and/or be controlled Outcome: Progressing   Problem: Safety: Goal: Ability to remain free from injury will improve Outcome: Progressing   Wendi Dash, RN

## 2024-02-28 NOTE — Progress Notes (Signed)
 Patient ID: [name], male   DOB: 1956-01-03, 68 y.o.   MRN: [id number] 22 Days Post-Op    Subjective/Interval: WBC 13.0 from 13.7. Sodium stable at 136 after discontinuing salt tabs. Patient sleeping comfortably. Easily awakened.  ROS negative except as listed above. Objective: Vital signs in last 24 hours: Temp:  [97.9 F (36.6 C)-98.6 F (37 C)] 98.6 F (37 C) (10/17 0321) Pulse Rate:  [93-110] 95 (10/17 0500) Resp:  [17-23] 18 (10/17 0500) BP: (101-170)/(64-104) 136/64 (10/17 0400) SpO2:  [90 %-97 %] 97 % (10/17 0500) Weight:  [73.7 kg] 73.7 kg (10/17 0440) Last BM Date : 02/27/24  Intake/Output from previous day: 10/16 0701 - 10/17 0700 In: 2302.3 [NG/GT:1666.8; IV Piggyback:405.5] Out: 2510 [Urine:2200; Drains:10; Stool:300] Intake/Output this shift: No intake/output data recorded.  General appearance: alert and cooperative Resp: clear to auscultation bilaterally, JP drain with minimal murky yellow/green fluid in bulb, 10cc over last 24h GI: soft, NT, J tube working Lab Results: CBC  Recent Labs    02/27/24 0913 02/28/24 0510  WBC 13.7* 13.0*  HGB 10.2* 10.2*  HCT 32.0* 31.7*  PLT 574* 583*   BMET Recent Labs    02/27/24 0602 02/28/24 0510  NA 136 136  K 4.1 4.0  CL 99 99  CO2 28 28  GLUCOSE 117* 132*  BUN 29* 31*  CREATININE 0.62 0.67  CALCIUM  9.0 9.3    PT/INR No results for input(s): LABPROT, INR in the last 72 hours. ABG No results for input(s): PHART, HCO3 in the last 72 hours.  Invalid input(s): PCO2, PO2  Studies/Results: No results found.  Anti-infectives: Anti-infectives (From admission, onward)    Start     Dose/Rate Route Frequency Ordered Stop   02/07/24 2000  vancomycin  (VANCOCIN ) IVPB 1000 mg/200 mL premix  Status:  Discontinued        1,000 mg 200 mL/hr over 60 Minutes Intravenous Every 12 hours 02/07/24 1229 02/08/24 0725   02/07/24 0915  vancomycin  (VANCOREADY) IVPB 1500 mg/300 mL        1,500 mg 150  mL/hr over 120 Minutes Intravenous  Once 02/07/24 0823 02/07/24 1125   02/02/24 0400  micafungin  (MYCAMINE ) 100 mg in sodium chloride  0.9 % 100 mL IVPB        100 mg 105 mL/hr over 1 Hours Intravenous Daily 02/02/24 0248 03/15/24 1400   02/02/24 0400  meropenem  (MERREM ) 1,000 mg in sodium chloride  0.9 % 100 mL IVPB        1,000 mg 200 mL/hr over 30 Minutes Intravenous Every 8 hours 02/02/24 0248 03/15/24 1400   02/02/24 0315  vancomycin  (VANCOCIN ) IVPB 1000 mg/200 mL premix        1,000 mg 200 mL/hr over 60 Minutes Intravenous  Once 02/02/24 0228 02/02/24 0510   01/31/24 1800  piperacillin -tazobactam (ZOSYN ) IVPB 3.375 g  Status:  Discontinued        3.375 g 12.5 mL/hr over 240 Minutes Intravenous Every 8 hours 01/31/24 1639 02/02/24 0248   01/31/24 1745  fluconazole  (DIFLUCAN ) IVPB 400 mg  Status:  Discontinued        400 mg 100 mL/hr over 120 Minutes Intravenous Every 24 hours 01/31/24 1659 02/02/24 0248   01/22/24 1000  fluconazole  (DIFLUCAN ) IVPB 400 mg       Placed in Followed by Linked Group   400 mg 100 mL/hr over 120 Minutes Intravenous Every 24 hours 01/21/24 0956 01/30/24 1134   01/21/24 1045  fluconazole  (DIFLUCAN ) IVPB 800 mg  Placed in Followed by Linked Group   800 mg 200 mL/hr over 120 Minutes Intravenous  Once 01/21/24 0956 01/21/24 1441   01/21/24 0945  piperacillin -tazobactam (ZOSYN ) IVPB 3.375 g        3.375 g 12.5 mL/hr over 240 Minutes Intravenous Every 8 hours 01/21/24 0920 01/30/24 2216   01/16/24 1510  ceFAZolin  (ANCEF ) 2-4 GM/100ML-% IVPB       Note to Pharmacy: Cindie Pizza: cabinet override      01/16/24 1510 01/17/24 0314   01/15/24 0645  ceFAZolin  (ANCEF ) IVPB 2g/100 mL premix       Placed in And Linked Group   2 g 200 mL/hr over 30 Minutes Intravenous On call to O.R. 01/15/24 0641 01/15/24 1700   01/15/24 0645  metroNIDAZOLE  (FLAGYL ) IVPB 500 mg       Placed in And Linked Group   500 mg 100 mL/hr over 60 Minutes Intravenous On call to  O.R. 01/15/24 9358 01/15/24 0910   01/15/24 9357  ceFAZolin  (ANCEF ) IVPB 2g/100 mL premix  Status:  Discontinued        2 g 200 mL/hr over 30 Minutes Intravenous 30 min pre-op 01/15/24 9357 01/15/24 0644       Assessment/Plan: Mr. Wrage is a 68 yo male with gastric cardia adenocarcinoma, s/p total gastrectomy, with distal esophagectomy and roux-en-Y esophagojejunostomy on 9/3. S/p takeback by thoracic surgery for RATS repair of anastomotic leak on 9/4. S/p EGD with placement of esophageal stent on 9/11. S/p EGD with repositioning of esophageal stent 9/25.  - Has had respiratory arrest x2 associated with hypercarbia. Extubated 9/27, now on RA - Clonidine  patch discontinued, consider risperdal  ODT - Pain control: scheduled tylenol , prn oxycodone . - Hypertension: home hydrochlorothiazide, metoprolol  62.5mg  BID   - EJ leak: S/p esophageal stent placement. Pleural JP to remain in place. On antibiotics and antifungal coverage per thoracic, will need 6 weeks total.  Thoracic planning for stent exchange at 6 weeks. Okay for discharge to [facility] prior to stent exchange if accepted.  - FEN: Continue J tube feeds at goal. Patient is tolerating and having bowel function. FWF interval now 30q6h given hyponatremia after initial correction of hypernatremia.  - ID: On broad-spectrum abx and antifungal coverage (Merrem  and Micafungin ) for treatment of aspiration pneumonia and EJ leak. No good enteral options per pharmacy in addition to concern for TF interfering with absorption. AM CBC downtrending to 13.0 from 13.7 - A-fib: remains in sinus rhythm. On therapeutic lovenox . - Code status: back to full code per patient wishes - Dispo: 4NP, PT recommending SNF at discharge, SW working on placement. Plan for [facility] discharge then to SNF given need for continued needs for IV antibiotics.   LOS: 44 days    [name], MD  Trauma & General Surgery  02/28/2024

## 2024-02-28 NOTE — Plan of Care (Signed)
  Problem: Education: Goal: Ability to describe self-care measures that may prevent or decrease complications (Diabetes Survival Skills Education) will improve 02/28/2024 2151 by Pearly Doffing, RN Outcome: Progressing 02/28/2024 2150 by Pearly Doffing, RN Outcome: Progressing Goal: Individualized Educational Video(s) 02/28/2024 2151 by Pearly Doffing, RN Outcome: Progressing 02/28/2024 2150 by Pearly Doffing, RN Outcome: Progressing   Problem: Coping: Goal: Ability to adjust to condition or change in health will improve 02/28/2024 2151 by Pearly Doffing, RN Outcome: Progressing 02/28/2024 2150 by Pearly Doffing, RN Outcome: Progressing   Problem: Fluid Volume: Goal: Ability to maintain a balanced intake and output will improve 02/28/2024 2151 by Pearly Doffing, RN Outcome: Progressing 02/28/2024 2150 by Pearly Doffing, RN Outcome: Progressing   Problem: Health Behavior/Discharge Planning: Goal: Ability to identify and utilize available resources and services will improve 02/28/2024 2151 by Pearly Doffing, RN Outcome: Progressing 02/28/2024 2150 by Pearly Doffing, RN Outcome: Progressing Goal: Ability to manage health-related needs will improve 02/28/2024 2151 by Pearly Doffing, RN Outcome: Progressing 02/28/2024 2150 by Pearly Doffing, RN Outcome: Progressing   Problem: Metabolic: Goal: Ability to maintain appropriate glucose levels will improve 02/28/2024 2151 by Pearly Doffing, RN Outcome: Progressing 02/28/2024 2150 by Pearly Doffing, RN Outcome: Progressing   Problem: Nutritional: Goal: Maintenance of adequate nutrition will improve 02/28/2024 2151 by Pearly Doffing, RN Outcome: Progressing 02/28/2024 2150 by Pearly Doffing, RN Outcome: Progressing Goal: Progress toward achieving an optimal weight will improve 02/28/2024 2151 by Pearly Doffing, RN Outcome: Progressing 02/28/2024 2150 by Pearly Doffing, RN Outcome: Progressing   Problem: Skin Integrity: Goal: Risk for  impaired skin integrity will decrease 02/28/2024 2151 by Pearly Doffing, RN Outcome: Progressing 02/28/2024 2150 by Pearly Doffing, RN Outcome: Progressing   Problem: Tissue Perfusion: Goal: Adequacy of tissue perfusion will improve 02/28/2024 2151 by Pearly Doffing, RN Outcome: Progressing 02/28/2024 2150 by Pearly Doffing, RN Outcome: Progressing   Problem: Education: Goal: Knowledge of General Education information will improve Description: Including pain rating scale, medication(s)/side effects and non-pharmacologic comfort measures 02/28/2024 2151 by Pearly Doffing, RN Outcome: Progressing 02/28/2024 2150 by Pearly Doffing, RN Outcome: Progressing   Problem: Clinical Measurements: Goal: Will remain free from infection 02/28/2024 2151 by Pearly Doffing, RN Outcome: Progressing 02/28/2024 2150 by Pearly Doffing, RN Outcome: Progressing Goal: Respiratory complications will improve 02/28/2024 2151 by Pearly Doffing, RN Outcome: Progressing 02/28/2024 2150 by Pearly Doffing, RN Outcome: Progressing   Problem: Activity: Goal: Risk for activity intolerance will decrease 02/28/2024 2151 by Pearly Doffing, RN Outcome: Progressing 02/28/2024 2150 by Pearly Doffing, RN Outcome: Progressing   Problem: Pain Managment: Goal: General experience of comfort will improve and/or be controlled 02/28/2024 2151 by Pearly Doffing, RN Outcome: Progressing 02/28/2024 2150 by Pearly Doffing, RN Outcome: Progressing   Problem: Safety: Goal: Ability to remain free from injury will improve 02/28/2024 2151 by Pearly Doffing, RN Outcome: Progressing 02/28/2024 2150 by Pearly Doffing, RN Outcome: Progressing

## 2024-02-28 NOTE — Progress Notes (Signed)
 Occupational Therapy Treatment Patient Details Name: [name] MRN: [id number] DOB: [birth year] Today's Date: 02/28/2024   History of present illness 68 y.o. male presents to [facility] on 01/15/2024 with gastric CA. Pt underwent total gastrectomy with omentectomy and D2 lymphadenectomy, esophagojejunostomy with Jtube placement, splenic biopsy on 9/3. Pt developed bilious output into chest tube on 9/4, returned to OR for EGD and thoracoscopy with application of wound matrix to EJ anastomosis. ETT 9/11-9/12 for desats and unresponsiveness. S/p EGD and esophageal stent placement 9/11. Cardiac arrest 9/19. Re-ett 9/19-9/22. 9/26 PEA respiratory failure with intubation extubation 9/27.  Psychiatry evaluation to determine competency and per MD note, at time of evaluation, patient lacked medical decisional capacity regarding disposition... Recommend speaking with HCPOA regarding any dispositional planning and possibly consult palliative care to clarify GOC. PMH includes asthma, OA, COPD, GERD, DMII, MDD, emphysema.   OT comments  Pt. Seen for skilled OT treatment session with CNA present and assisting prn.  Pt. Awake and agreeable for participation.  Mod a for bed mobility for trunk support and max verbal and tactile cues for sequencing and completion of tasks.  Pt. Noted to have long delays with one step cues. LB dressing MIN A seated eob for socks.  Sit/stand and pivot with MOD A +2.  CNA reports pt. Was teasing/joking and does not usually require this level of assistance.  Agree with current d/c recommendations.  Cont. With acute OT POC.        If plan is discharge home, recommend the following:  A lot of help with walking and/or transfers;Two people to help with walking and/or transfers;A lot of help with bathing/dressing/bathroom;Two people to help with bathing/dressing/bathroom;Assistance with cooking/housework;Assist for transportation;Help with stairs or ramp for entrance   Equipment  Recommendations       Recommendations for Other Services      Precautions / Restrictions Precautions Precautions: Fall Precaution/Restrictions Comments: R JP drain x 1, J tube, R port, flexiseal, mittens       Mobility Bed Mobility Overal bed mobility: Needs Assistance Bed Mobility: Supine to Sit     Supine to sit: Mod assist, HOB elevated, Used rails     General bed mobility comments: pt. required more assistance to cont. task once it was initiated.  pt. would take long pauses and requrie tactile and verbal cues for continuation.  support on trunk to come upright.  and also for scooting RLE to floor once seated eob    Transfers Overall transfer level: Needs assistance Equipment used: 2 person hand held assist Transfers: Sit to/from Stand, Bed to chair/wheelchair/BSC Sit to Stand: Mod assist, +2 physical assistance, +2 safety/equipment Stand pivot transfers: Mod assist, +2 physical assistance, +2 safety/equipment         General transfer comment: difficult to assess actual mobility level of assist as pt. was joking a lot and even with cues to be serious he would pretend to need more help than was needed. cna who was present and has been assisting getting him up each day states she has managed with one person assistance and he was not actually requiring the assistance he was demonstrating today     Balance                                           ADL either performed or assessed with clinical judgement   ADL Overall ADL's : Needs assistance/impaired  Upper Body Dressing : Minimal assistance;Sitting   Lower Body Dressing: Contact guard assist;Sitting/lateral leans Lower Body Dressing Details (indicate cue type and reason): pt. able to access BLEs for sock management seated eob with leg partially bend on bed to access w/o fully bending forward to reach feet. Toilet Transfer: Moderate assistance;+2 for physical assistance;+2 for  safety/equipment;Stand-pivot Toilet Transfer Details (indicate cue type and reason): cues for sequence, sit/stand from eob with assistance from CNA. pt. with full pivot to the left for line management and able to sit in recliner with controlled descend         Functional mobility during ADLs: Moderate assistance;+2 for physical assistance;+2 for safety/equipment General ADL Comments: difficult to assess actual mobility level of assist as pt. was joking a lot and even with cues to be serious he would pretend to need more help than was needed. cna who was present and has been assisting getting him up each day states she has managed with one person assistance and he was not actually requiring the assistance he was demonstrating today    Extremity/Trunk Assessment              Vision       Perception     Praxis     Communication Communication Communication: Impaired Factors Affecting Communication: Reduced clarity of speech;Difficulty expressing self   Cognition Arousal: Alert Behavior During Therapy: WFL for tasks assessed/performed Cognition: No family/caregiver present to determine baseline     Awareness: Online awareness impaired Memory impairment (select all impairments): Short-term memory, Working memory Attention impairment (select first level of impairment): Sustained attention Executive functioning impairment (select all impairments): Reasoning, Problem solving, Initiation OT - Cognition Comments: pt. awake/alert making jokes and winking and teasing with the nursing staff                 Following commands: Impaired Following commands impaired: Follows one step commands inconsistently, Follows one step commands with increased time      Cueing   Cueing Techniques: Verbal cues, Tactile cues  Exercises      Shoulder Instructions       General Comments      Pertinent Vitals/ Pain       Pain Assessment Pain Assessment: No/denies pain  Home Living                                           Prior Functioning/Environment              Frequency  Min 2X/week        Progress Toward Goals  OT Goals(current goals can now be found in the care plan section)  Progress towards OT goals: Progressing toward goals     Plan      Co-evaluation                 AM-PAC OT 6 Clicks Daily Activity     Outcome Measure   Help from another person eating meals?: A Little Help from another person taking care of personal grooming?: A Little Help from another person toileting, which includes using toliet, bedpan, or urinal?: A Lot Help from another person bathing (including washing, rinsing, drying)?: A Lot Help from another person to put on and taking off regular upper body clothing?: A Lot Help from another person to put on and taking off regular lower body clothing?: A Lot 6 Click  Score: 14    End of Session    OT Visit Diagnosis: Unsteadiness on feet (R26.81);Muscle weakness (generalized) (M62.81);Other symptoms and signs involving cognitive function;Pain   Activity Tolerance Patient tolerated treatment well   Patient Left in chair;with call bell/phone within reach;with restraints reapplied;Other (comment);with chair alarm set (lapbelt chair alarm, rn gives permission for no mittens needed at this time and they will reapply them later today)   Nurse Communication          Time: [phone] OT Time Calculation (min): 16 min  Charges: OT General Charges $OT Visit: 1 Visit OT Treatments $Self Care/Home Management : 8-22 mins  Randall, COTA/L Acute Rehabilitation [phone]   CHRISTELLA Nest Lorraine-COTA/L  02/28/2024, 11:49 AM

## 2024-02-29 LAB — CBC
HCT: 31.6 % — ABNORMAL LOW (ref 39.0–52.0)
Hemoglobin: 10 g/dL — ABNORMAL LOW (ref 13.0–17.0)
MCH: 28.8 pg (ref 26.0–34.0)
MCHC: 31.6 g/dL (ref 30.0–36.0)
MCV: 91.1 fL (ref 80.0–100.0)
Platelets: 587 K/uL — ABNORMAL HIGH (ref 150–400)
RBC: 3.47 MIL/uL — ABNORMAL LOW (ref 4.22–5.81)
RDW: 16.6 % — ABNORMAL HIGH (ref 11.5–15.5)
WBC: 12.8 K/uL — ABNORMAL HIGH (ref 4.0–10.5)
nRBC: 0 % (ref 0.0–0.2)

## 2024-02-29 LAB — GLUCOSE, CAPILLARY
Glucose-Capillary: 127 mg/dL — ABNORMAL HIGH (ref 70–99)
Glucose-Capillary: 146 mg/dL — ABNORMAL HIGH (ref 70–99)
Glucose-Capillary: 146 mg/dL — ABNORMAL HIGH (ref 70–99)
Glucose-Capillary: 113 mg/dL — ABNORMAL HIGH (ref 70–99)

## 2024-02-29 NOTE — Plan of Care (Signed)
  Problem: Education: Goal: Ability to describe self-care measures that may prevent or decrease complications (Diabetes Survival Skills Education) will improve Outcome: Progressing Goal: Individualized Educational Video(s) Outcome: Progressing   Problem: Coping: Goal: Ability to adjust to condition or change in health will improve Outcome: Progressing   Problem: Fluid Volume: Goal: Ability to maintain a balanced intake and output will improve Outcome: Progressing   Problem: Health Behavior/Discharge Planning: Goal: Ability to identify and utilize available resources and services will improve Outcome: Progressing Goal: Ability to manage health-related needs will improve Outcome: Progressing   Problem: Metabolic: Goal: Ability to maintain appropriate glucose levels will improve Outcome: Progressing   Problem: Nutritional: Goal: Maintenance of adequate nutrition will improve Outcome: Progressing Goal: Progress toward achieving an optimal weight will improve Outcome: Progressing   Problem: Skin Integrity: Goal: Risk for impaired skin integrity will decrease Outcome: Progressing   Problem: Tissue Perfusion: Goal: Adequacy of tissue perfusion will improve Outcome: Progressing   Problem: Education: Goal: Knowledge of General Education information will improve Description: Including pain rating scale, medication(s)/side effects and non-pharmacologic comfort measures Outcome: Progressing   Problem: Clinical Measurements: Goal: Will remain free from infection Outcome: Progressing Goal: Respiratory complications will improve Outcome: Progressing   Problem: Activity: Goal: Risk for activity intolerance will decrease Outcome: Progressing   Problem: Pain Managment: Goal: General experience of comfort will improve and/or be controlled Outcome: Progressing   Problem: Safety: Goal: Ability to remain free from injury will improve Outcome: Progressing

## 2024-02-29 NOTE — Plan of Care (Signed)
  Problem: Education: Goal: Individualized Educational Video(s) Outcome: Progressing   Problem: Fluid Volume: Goal: Ability to maintain a balanced intake and output will improve Outcome: Progressing   Problem: Metabolic: Goal: Ability to maintain appropriate glucose levels will improve Outcome: Progressing   Problem: Nutritional: Goal: Maintenance of adequate nutrition will improve Outcome: Progressing Goal: Progress toward achieving an optimal weight will improve Outcome: Progressing   Problem: Skin Integrity: Goal: Risk for impaired skin integrity will decrease Outcome: Progressing   Problem: Tissue Perfusion: Goal: Adequacy of tissue perfusion will improve Outcome: Progressing   Problem: Clinical Measurements: Goal: Will remain free from infection Outcome: Progressing Goal: Respiratory complications will improve Outcome: Progressing   Problem: Activity: Goal: Risk for activity intolerance will decrease Outcome: Progressing   Problem: Pain Managment: Goal: General experience of comfort will improve and/or be controlled Outcome: Progressing   Problem: Safety: Goal: Ability to remain free from injury will improve Outcome: Progressing

## 2024-02-29 NOTE — Progress Notes (Signed)
 Trauma/Critical Care Follow Up Note  Subjective:    Overnight Issues:   Objective:  Vital signs for last 24 hours: Temp:  [97.6 F (36.4 C)-98.4 F (36.9 C)] 98.4 F (36.9 C) (10/18 0800) Pulse Rate:  [89-105] 95 (10/18 0326) Resp:  [17-20] 17 (10/18 0326) BP: (106-151)/(56-95) 130/76 (10/18 0326) SpO2:  [91 %-98 %] 97 % (10/18 0326)  Hemodynamic parameters for last 24 hours:    Intake/Output from previous day: 10/17 0701 - 10/18 0700 In: 137.1 [I.V.:10; NG/GT:120; IV Piggyback:7.1] Out: 1210 [Urine:1200; Drains:10]  Intake/Output this shift: No intake/output data recorded.  Vent settings for last 24 hours:    Physical Exam:  Gen: comfortable, no distress Neuro: follows commands HEENT: PERRL Neck: supple CV: RRR Pulm: unlabored breathing on RA Abd: soft, NT, incision clean, dry, intact, JP SS, +BM GU: urine clear and yellow, +spontaneous voids Extr: wwp, no edema  Results for orders placed or performed during the hospital encounter of 01/15/24 (from the past 24 hours)  Glucose, capillary     Status: Abnormal   Collection Time: 02/28/24 12:01 PM  Result Value Ref Range   Glucose-Capillary 165 (H) 70 - 99 mg/dL  Glucose, capillary     Status: Abnormal   Collection Time: 02/28/24 11:36 PM  Result Value Ref Range   Glucose-Capillary 129 (H) 70 - 99 mg/dL  CBC     Status: Abnormal   Collection Time: 02/29/24  5:30 AM  Result Value Ref Range   WBC 12.8 (H) 4.0 - 10.5 K/uL   RBC 3.47 (L) 4.22 - 5.81 MIL/uL   Hemoglobin 10.0 (L) 13.0 - 17.0 g/dL   HCT 68.3 (L) 60.9 - 47.9 %   MCV 91.1 80.0 - 100.0 fL   MCH 28.8 26.0 - 34.0 pg   MCHC 31.6 30.0 - 36.0 g/dL   RDW 83.3 (H) 88.4 - 84.4 %   Platelets 587 (H) 150 - 400 K/uL   nRBC 0.0 0.0 - 0.2 %  Glucose, capillary     Status: Abnormal   Collection Time: 02/29/24  6:51 AM  Result Value Ref Range   Glucose-Capillary 127 (H) 70 - 99 mg/dL    Assessment & Plan: The plan of care was discussed with the bedside  nurse for the day, who is in agreement with this plan and no additional concerns were raised.   Present on Admission:  Adenocarcinoma of gastric cardia (HCC)    LOS: 45 days   Additional comments:I reviewed the patient's new clinical lab test results.   and I reviewed the patients new imaging test results.    [name] is a 68 yo male with gastric cardia adenocarcinoma, s/p total gastrectomy, with distal esophagectomy and roux-en-Y esophagojejunostomy on 9/3. S/p takeback by thoracic surgery for RATS repair of anastomotic leak on 9/4. S/p EGD with placement of esophageal stent on 9/11. S/p EGD with repositioning of esophageal stent 9/25.   - Has had respiratory arrest x2 associated with hypercarbia. Extubated 9/27, now on RA - Clonidine  patch discontinued, consider risperdal  ODT - Pain control: scheduled tylenol , prn oxycodone . - Hypertension: home hydrochlorothiazide, metoprolol  62.5mg  BID     - EJ leak: S/p esophageal stent placement. Pleural JP to remain in place. On antibiotics and antifungal coverage per thoracic, will need 6 weeks total.  Thoracic planning for stent exchange at 6 weeks. Okay for discharge to [facility] prior to stent exchange if accepted.   - FEN: Continue J tube feeds at goal. Patient is tolerating and having bowel  function. FWF interval now 30q6h given hyponatremia after initial correction of hypernatremia.   - ID: On broad-spectrum abx and antifungal coverage (Merrem  and Micafungin ) for treatment of aspiration pneumonia and EJ leak. No good enteral options per pharmacy in addition to concern for TF interfering with absorption. AM CBC stable at 12.8 from 13 - A-fib: remains in sinus rhythm. On therapeutic lovenox . - Code status: back to full code per patient wishes - Dispo: 4NP, PT recommending SNF at discharge, SW working on placement. Plan for [facility] discharge then to SNF given need for continued needs for IV antibiotics. 6w total course planned  [name],  MD Trauma & General Surgery Please use [email or web address] to contact on call provider  02/29/2024  *Care during the described time interval was provided by me. I have reviewed this patient's available data, including medical history, events of note, physical examination and test results as part of my evaluation.

## 2024-03-01 LAB — CBC
HCT: 31 % — ABNORMAL LOW (ref 39.0–52.0)
Hemoglobin: 10 g/dL — ABNORMAL LOW (ref 13.0–17.0)
MCH: 29.3 pg (ref 26.0–34.0)
MCHC: 32.3 g/dL (ref 30.0–36.0)
MCV: 90.9 fL (ref 80.0–100.0)
Platelets: 553 K/uL — ABNORMAL HIGH (ref 150–400)
RBC: 3.41 MIL/uL — ABNORMAL LOW (ref 4.22–5.81)
RDW: 16.5 % — ABNORMAL HIGH (ref 11.5–15.5)
WBC: 13.1 K/uL — ABNORMAL HIGH (ref 4.0–10.5)
nRBC: 0 % (ref 0.0–0.2)

## 2024-03-01 LAB — GLUCOSE, CAPILLARY
Glucose-Capillary: 133 mg/dL — ABNORMAL HIGH (ref 70–99)
Glucose-Capillary: 153 mg/dL — ABNORMAL HIGH (ref 70–99)
Glucose-Capillary: 142 mg/dL — ABNORMAL HIGH (ref 70–99)
Glucose-Capillary: 128 mg/dL — ABNORMAL HIGH (ref 70–99)

## 2024-03-01 NOTE — Progress Notes (Signed)
 Trauma/Critical Care Follow Up Note  Subjective:    Overnight Issues:   Objective:  Vital signs for last 24 hours: Temp:  [97.4 F (36.3 C)-98.2 F (36.8 C)] 97.4 F (36.3 C) (10/19 0759) Pulse Rate:  [87-100] 90 (10/19 0759) Resp:  [16-21] 16 (10/19 0759) BP: (129-158)/(88-99) 144/92 (10/19 0759) SpO2:  [96 %-98 %] 98 % (10/19 0759)  Hemodynamic parameters for last 24 hours:    Intake/Output from previous day: 10/18 0701 - 10/19 0700 In: 3342.2 [NG/GT:2639.8; IV Piggyback:702.3] Out: 1610 [Urine:1600; Drains:10]  Intake/Output this shift: No intake/output data recorded.  Vent settings for last 24 hours:    Physical Exam:  Gen: comfortable, no distress Neuro: sedated on exam HEENT: PERRL Neck: supple CV: RRR Pulm: unlabored breathing on RA Abd: soft, NT  , +BM GU: urine clear and yellow, +spontaneous voids Extr: wwp, no edema  Results for orders placed or performed during the hospital encounter of 01/15/24 (from the past 24 hours)  Glucose, capillary     Status: Abnormal   Collection Time: 02/29/24 12:11 PM  Result Value Ref Range   Glucose-Capillary 146 (H) 70 - 99 mg/dL  Glucose, capillary     Status: Abnormal   Collection Time: 02/29/24  5:44 PM  Result Value Ref Range   Glucose-Capillary 146 (H) 70 - 99 mg/dL  Glucose, capillary     Status: Abnormal   Collection Time: 02/29/24 11:29 PM  Result Value Ref Range   Glucose-Capillary 113 (H) 70 - 99 mg/dL  CBC     Status: Abnormal   Collection Time: 03/01/24  5:30 AM  Result Value Ref Range   WBC 13.1 (H) 4.0 - 10.5 K/uL   RBC 3.41 (L) 4.22 - 5.81 MIL/uL   Hemoglobin 10.0 (L) 13.0 - 17.0 g/dL   HCT 68.9 (L) 60.9 - 47.9 %   MCV 90.9 80.0 - 100.0 fL   MCH 29.3 26.0 - 34.0 pg   MCHC 32.3 30.0 - 36.0 g/dL   RDW 83.4 (H) 88.4 - 84.4 %   Platelets 553 (H) 150 - 400 K/uL   nRBC 0.0 0.0 - 0.2 %  Glucose, capillary     Status: Abnormal   Collection Time: 03/01/24  6:07 AM  Result Value Ref Range    Glucose-Capillary 133 (H) 70 - 99 mg/dL    Assessment & Plan: The plan of care was discussed with the bedside nurse for the day, [name], who is in agreement with this plan and no additional concerns were raised.   Present on Admission:  Adenocarcinoma of gastric cardia (HCC)    LOS: 46 days   Additional comments:I reviewed the patient's new clinical lab test results.   and I reviewed the patients new imaging test results.    [name] is a 68 yo male with gastric cardia adenocarcinoma, s/p total gastrectomy, with distal esophagectomy and roux-en-Y esophagojejunostomy on 9/3. S/p takeback by thoracic surgery for RATS repair of anastomotic leak on 9/4. S/p EGD with placement of esophageal stent on 9/11. S/p EGD with repositioning of esophageal stent 9/25.   - Has had respiratory arrest x2 associated with hypercarbia. Extubated 9/27, now on RA - Clonidine  patch discontinued, consider risperdal  ODT - Pain control: scheduled tylenol , prn oxycodone . - Hypertension: home hydrochlorothiazide, metoprolol  62.5mg  BID     - EJ leak: S/p esophageal stent placement. Pleural JP to remain in place. On antibiotics and antifungal coverage per thoracic, will need 6 weeks total.  Thoracic planning for stent exchange at 6  weeks. Okay for discharge to [facility] prior to stent exchange if accepted.   - FEN: Continue J tube feeds at goal. Patient is tolerating and having bowel function. FWF interval now 30q6h given hyponatremia after initial correction of hypernatremia.   - ID: On broad-spectrum abx and antifungal coverage (Merrem  and Micafungin ) for treatment of aspiration pneumonia and EJ leak. No good enteral options per pharmacy in addition to concern for TF interfering with absorption. AM CBC stable at 12.8 from 13 - A-fib: remains in sinus rhythm. On therapeutic lovenox . - Code status: back to full code per patient wishes - Dispo: 4NP, PT recommending SNF at discharge, SW working on placement. Plan for [facility]  discharge then to SNF given need for continued needs for IV antibiotics. 6w total course planned  Jon GEANNIE Hanger, MD Trauma & General Surgery Please use [email or web address] to contact on call provider  03/01/2024  *Care during the described time interval was provided by me. I have reviewed this patient's available data, including medical history, events of note, physical examination and test results as part of my evaluation.

## 2024-03-01 NOTE — Progress Notes (Incomplete)
 Trauma/Critical Care Follow Up Note  Subjective:    Overnight Issues:   Objective:  Vital signs for last 24 hours: Temp:  [97.4 F (36.3 C)-97.9 F (36.6 C)] 97.9 F (36.6 C) (10/19 2029) Pulse Rate:  [74-90] 74 (10/19 2042) Resp:  [14-20] 20 (10/19 2042) BP: (129-155)/(88-96) 149/94 (10/19 2029) SpO2:  [96 %-98 %] 98 % (10/19 2042)  Hemodynamic parameters for last 24 hours:    Intake/Output from previous day: 10/18 0701 - 10/19 0700 In: 3342.2 [NG/GT:2639.8; IV Piggyback:702.3] Out: 1610 [Urine:1600; Drains:10]  Intake/Output this shift: Total I/O In: 50 [Other:50] Out: 105 [Drains:5; Stool:100]  Vent settings for last 24 hours:    Physical Exam:  Gen: comfortable, no distress Neuro: sedated on exam HEENT: PERRL Neck: supple CV: RRR Pulm: unlabored breathing on RA Abd: soft, NT  , +BM GU: urine clear and yellow, +spontaneous voids Extr: wwp, no edema  Results for orders placed or performed during the hospital encounter of 01/15/24 (from the past 24 hours)  Glucose, capillary     Status: Abnormal   Collection Time: 02/29/24 11:29 PM  Result Value Ref Range   Glucose-Capillary 113 (H) 70 - 99 mg/dL  CBC     Status: Abnormal   Collection Time: 03/01/24  5:30 AM  Result Value Ref Range   WBC 13.1 (H) 4.0 - 10.5 K/uL   RBC 3.41 (L) 4.22 - 5.81 MIL/uL   Hemoglobin 10.0 (L) 13.0 - 17.0 g/dL   HCT 68.9 (L) 60.9 - 47.9 %   MCV 90.9 80.0 - 100.0 fL   MCH 29.3 26.0 - 34.0 pg   MCHC 32.3 30.0 - 36.0 g/dL   RDW 83.4 (H) 88.4 - 84.4 %   Platelets 553 (H) 150 - 400 K/uL   nRBC 0.0 0.0 - 0.2 %  Glucose, capillary     Status: Abnormal   Collection Time: 03/01/24  6:07 AM  Result Value Ref Range   Glucose-Capillary 133 (H) 70 - 99 mg/dL  Glucose, capillary     Status: Abnormal   Collection Time: 03/01/24 11:58 AM  Result Value Ref Range   Glucose-Capillary 153 (H) 70 - 99 mg/dL  Glucose, capillary     Status: Abnormal   Collection Time: 03/01/24  5:53 PM   Result Value Ref Range   Glucose-Capillary 142 (H) 70 - 99 mg/dL  Glucose, capillary     Status: Abnormal   Collection Time: 03/01/24  8:27 PM  Result Value Ref Range   Glucose-Capillary 128 (H) 70 - 99 mg/dL    Assessment & Plan: The plan of care was discussed with the bedside nurse for the day, Arley, who is in agreement with this plan and no additional concerns were raised.   Present on Admission:  Adenocarcinoma of gastric cardia (HCC)    LOS: 46 days   Additional comments:I reviewed the patient's new clinical lab test results.   and I reviewed the patients new imaging test results.    [name] is a 68 yo male with gastric cardia adenocarcinoma, s/p total gastrectomy, with distal esophagectomy and roux-en-Y esophagojejunostomy on 9/3. S/p takeback by thoracic surgery for RATS repair of anastomotic leak on 9/4. S/p EGD with placement of esophageal stent on 9/11. S/p EGD with repositioning of esophageal stent 9/25.   - Has had respiratory arrest x2 associated with hypercarbia. Extubated 9/27, now on RA - Pain control: scheduled tylenol , prn oxycodone . - Hypertension: home hydrochlorothiazide, metoprolol  62.5mg  BID     - EJ leak: S/p  esophageal stent placement. Pleural JP to remain in place. On antibiotics and antifungal coverage per thoracic, will need 6 weeks total.  Thoracic planning for stent exchange at 6 weeks. Okay for discharge to [facility] prior to stent exchange if accepted.   - FEN: Continue J tube feeds at goal. Patient is tolerating and having bowel function. FWF interval now 30q6h given hyponatremia after initial correction of hypernatremia.   - ID: On broad-spectrum abx and antifungal coverage (Merrem  and Micafungin ) for treatment of aspiration pneumonia and EJ leak. No good enteral options per pharmacy in addition to concern for TF interfering with absorption. AM CBC *** - A-fib: remains in sinus rhythm. On therapeutic lovenox . - Code status: back to full code per  patient wishes - Dispo: 4NP, PT recommending LTACH at discharge, SW working on placement. Plan for [facility] discharge then to SNF given need for continued needs for IV antibiotics. 6w total course planned  Orie Silversmith, MD [facility] Surgery  03/01/2024  *Care during the described time interval was provided by me. I have reviewed this patient's available data, including medical history, events of note, physical examination and test results as part of my evaluation.

## 2024-03-01 NOTE — Plan of Care (Signed)
  Problem: Education: Goal: Individualized Educational Video(s) Outcome: Progressing   Problem: Fluid Volume: Goal: Ability to maintain a balanced intake and output will improve Outcome: Progressing   Problem: Metabolic: Goal: Ability to maintain appropriate glucose levels will improve Outcome: Progressing   Problem: Nutritional: Goal: Maintenance of adequate nutrition will improve Outcome: Progressing Goal: Progress toward achieving an optimal weight will improve Outcome: Progressing   Problem: Skin Integrity: Goal: Risk for impaired skin integrity will decrease Outcome: Progressing   Problem: Tissue Perfusion: Goal: Adequacy of tissue perfusion will improve Outcome: Progressing   Problem: Education: Goal: Knowledge of General Education information will improve Description: Including pain rating scale, medication(s)/side effects and non-pharmacologic comfort measures Outcome: Progressing   Problem: Clinical Measurements: Goal: Will remain free from infection Outcome: Progressing Goal: Respiratory complications will improve Outcome: Progressing   Problem: Activity: Goal: Risk for activity intolerance will decrease Outcome: Progressing   Problem: Pain Managment: Goal: General experience of comfort will improve and/or be controlled Outcome: Progressing   Problem: Safety: Goal: Ability to remain free from injury will improve Outcome: Progressing

## 2024-03-02 LAB — CBC
HCT: 31.3 % — ABNORMAL LOW (ref 39.0–52.0)
Hemoglobin: 10.3 g/dL — ABNORMAL LOW (ref 13.0–17.0)
MCH: 29.3 pg (ref 26.0–34.0)
MCHC: 32.9 g/dL (ref 30.0–36.0)
MCV: 88.9 fL (ref 80.0–100.0)
Platelets: 546 K/uL — ABNORMAL HIGH (ref 150–400)
RBC: 3.52 MIL/uL — ABNORMAL LOW (ref 4.22–5.81)
RDW: 16.7 % — ABNORMAL HIGH (ref 11.5–15.5)
WBC: 11.7 K/uL — ABNORMAL HIGH (ref 4.0–10.5)
nRBC: 0 % (ref 0.0–0.2)

## 2024-03-02 LAB — RENAL FUNCTION PANEL
Albumin: 2.2 g/dL — ABNORMAL LOW (ref 3.5–5.0)
Anion gap: 11 (ref 5–15)
BUN: 31 mg/dL — ABNORMAL HIGH (ref 8–23)
CO2: 27 mmol/L (ref 22–32)
Calcium: 9.1 mg/dL (ref 8.9–10.3)
Chloride: 97 mmol/L — ABNORMAL LOW (ref 98–111)
Creatinine, Ser: 0.68 mg/dL (ref 0.61–1.24)
GFR, Estimated: >60 mL/min (ref >60)
Glucose, Bld: 152 mg/dL — ABNORMAL HIGH (ref 70–99)
Phosphorus: 3.5 mg/dL (ref 2.5–4.6)
Potassium: 3.9 mmol/L (ref 3.5–5.1)
Sodium: 135 mmol/L (ref 135–145)

## 2024-03-02 LAB — GLUCOSE, CAPILLARY
Glucose-Capillary: 108 mg/dL — ABNORMAL HIGH (ref 70–99)
Glucose-Capillary: 133 mg/dL — ABNORMAL HIGH (ref 70–99)
Glucose-Capillary: 145 mg/dL — ABNORMAL HIGH (ref 70–99)
Glucose-Capillary: 148 mg/dL — ABNORMAL HIGH (ref 70–99)

## 2024-03-02 LAB — MAGNESIUM: Magnesium: 2 mg/dL (ref 1.7–2.4)

## 2024-03-02 NOTE — Progress Notes (Signed)
 Physical Therapy Treatment Patient Details Name: [name] MRN: [id number] DOB: [birth year] Today's Date: 03/02/2024   History of Present Illness 68 y.o. male presents to [facility] hospital on 01/15/2024 with gastric CA. Pt underwent total gastrectomy with omentectomy and D2 lymphadenectomy, esophagojejunostomy with Jtube placement, splenic biopsy on 9/3. Pt developed bilious output into chest tube on 9/4, returned to OR for EGD and thoracoscopy with application of wound matrix to EJ anastomosis. ETT 9/11-9/12 for desats and unresponsiveness. S/p EGD and esophageal stent placement 9/11. Cardiac arrest 9/19. Re-ett 9/19-9/22. 9/26 PEA respiratory failure with intubation extubation 9/27.  Psychiatry evaluation to determine competency and per MD note, at time of evaluation, patient lacked medical decisional capacity regarding disposition... Recommend speaking with HCPOA regarding any dispositional planning and possibly consult palliative care to clarify GOC. PMH includes asthma, OA, COPD, GERD, DMII, MDD, emphysema.    PT Comments  Pt sleeping upon arrival to room, awoke briefly but had several periods of dozing off even with EOB activity. Pt requiring increased physical assist given lethargy today, and is A&Ox3. Pt sat EOB with plan for attempt OOB, but unsafe given pt lethargy and falling asleep EOB. RN notified of this, VSS throughout. PT to continue to follow.      If plan is discharge home, recommend the following: A lot of help with bathing/dressing/bathroom;Assistance with cooking/housework;Direct supervision/assist for medications management;Direct supervision/assist for financial management;Assist for transportation;Help with stairs or ramp for entrance;Supervision due to cognitive status;A lot of help with walking and/or transfers   Can travel by private vehicle     No  Equipment Recommendations  Wheelchair (measurements PT);Wheelchair cushion (measurements PT);BSC/3in1;Rolling walker (2 wheels)     Recommendations for Other Services       Precautions / Restrictions Precautions Precautions: Fall Precaution/Restrictions Comments: R JP drain x 1, J tube, R port, flexiseal, tube feeds paused during session Restrictions Weight Bearing Restrictions Per Provider Order: No     Mobility  Bed Mobility Overal bed mobility: Needs Assistance Bed Mobility: Supine to Sit, Sit to Supine Rolling: Mod assist     Sit to supine: Mod assist Sit to sidelying: Mod assist General bed mobility comments: assist for trunk and LE management, scooting to/from EOB, boost up +2 once returned to supine    Transfers                   General transfer comment: nt    Ambulation/Gait                   Stairs             Wheelchair Mobility     Tilt Bed    Modified Rankin (Stroke Patients Only)       Balance Overall balance assessment: Needs assistance Sitting-balance support: Single extremity supported, Feet supported Sitting balance-Leahy Scale: Fair         Standing balance comment: nt today - too lethargic                            Communication Communication Communication: Impaired Factors Affecting Communication: Reduced clarity of speech;Difficulty expressing self  Cognition Arousal: Lethargic Behavior During Therapy: Flat affect   PT - Cognitive impairments: Awareness, Memory, Problem solving, Safety/Judgement, Attention, Initiation, Sequencing Difficult to assess due to: Impaired communication, Level of arousal                     PT - Cognition Comments: pt  states month is September, does not recall October when re-asked 5 minutes later. Intermittent dosing off during session with anterior loss of saliva multiple times. Much more lethargic today vs previous Following commands: Impaired Following commands impaired: Follows one step commands inconsistently, Follows one step commands with increased time    Cueing Cueing  Techniques: Verbal cues, Tactile cues  Exercises      General Comments        Pertinent Vitals/Pain Pain Assessment Pain Assessment: Faces Faces Pain Scale: Hurts little more Pain Location: generalized Pain Descriptors / Indicators: Discomfort, Sore Pain Intervention(s): Limited activity within patient's tolerance, Repositioned, Monitored during session    Home Living                          Prior Function            PT Goals (current goals can now be found in the care plan section) Acute Rehab PT Goals Patient Stated Goal: to return to independence PT Goal Formulation: With patient Time For Goal Achievement: 03/16/24 Potential to Achieve Goals: Fair Progress towards PT goals: Not progressing toward goals - comment (lethargy)    Frequency    Min 2X/week      PT Plan      Co-evaluation              AM-PAC PT 6 Clicks Mobility   Outcome Measure  Help needed turning from your back to your side while in a flat bed without using bedrails?: A Lot Help needed moving from lying on your back to sitting on the side of a flat bed without using bedrails?: A Lot Help needed moving to and from a bed to a chair (including a wheelchair)?: Total Help needed standing up from a chair using your arms (e.g., wheelchair or bedside chair)?: Total Help needed to walk in hospital room?: Total Help needed climbing 3-5 steps with a railing? : Total 6 Click Score: 8    End of Session Equipment Utilized During Treatment: Other (comment) (abd binder) Activity Tolerance: Patient limited by lethargy;Patient limited by pain Patient left: in bed;with call bell/phone within reach;with bed alarm set;with nursing/sitter in room (posey bed alarm belt) Nurse Communication: Mobility status (lethargy) PT Visit Diagnosis: Other abnormalities of gait and mobility (R26.89);Muscle weakness (generalized) (M62.81);Pain     Time: 1553-1610 PT Time Calculation (min) (ACUTE ONLY): 17  min  Charges:    $Therapeutic Activity: 8-22 mins PT General Charges $$ ACUTE PT VISIT: 1 Visit                     [name], PT DPT Acute Rehabilitation Services Secure Chat Preferred  Office [phone]    Randale Carvalho [place] 03/02/2024, 4:15 PM

## 2024-03-02 NOTE — Plan of Care (Signed)

## 2024-03-02 NOTE — Progress Notes (Addendum)
 Patient ID: Hakop Humbarger, male   DOB: 06/20/1955, 68 y.o.   MRN: [id number] 25 Days Post-Op    Subjective/Interval: NAEO  Objective: Vital signs in last 24 hours: Temp:  [97.4 F (36.3 C)-98 F (36.7 C)] 98 F (36.7 C) (10/20 0317) Pulse Rate:  [74-95] 95 (10/20 0317) Resp:  [14-22] 22 (10/20 0317) BP: (110-155)/(66-96) 122/77 (10/20 0317) SpO2:  [94 %-98 %] 98 % (10/20 0317) Weight:  [73.2 kg] 73.2 kg (10/20 0420) Last BM Date : 03/01/24  Intake/Output from previous day: 10/19 0701 - 10/20 0700 In: 140 [NG/GT:90] Out: 1055 [Urine:950; Drains:5; Stool:100] Intake/Output this shift: Total I/O In: 80 [Other:50; NG/GT:30] Out: 105 [Drains:5; Stool:100]  General appearance: alert and cooperative Resp: clear to auscultation bilaterally, JP drain with trace murky yellow/green fluid in bulb, 5cc over last 24h GI: soft, NT, J tube working Lab Results: CBC  Recent Labs    02/29/24 0530 03/01/24 0530  WBC 12.8* 13.1*  HGB 10.0* 10.0*  HCT 31.6* 31.0*  PLT 587* 553*   BMET Recent Labs    02/28/24 0510  NA 136  K 4.0  CL 99  CO2 28  GLUCOSE 132*  BUN 31*  CREATININE 0.67  CALCIUM  9.3    PT/INR No results for input(s): LABPROT, INR in the last 72 hours. ABG No results for input(s): PHART, HCO3 in the last 72 hours.  Invalid input(s): PCO2, PO2  Studies/Results: No results found.  Anti-infectives: Anti-infectives (From admission, onward)    Start     Dose/Rate Route Frequency Ordered Stop   02/07/24 2000  vancomycin  (VANCOCIN ) IVPB 1000 mg/200 mL premix  Status:  Discontinued        1,000 mg 200 mL/hr over 60 Minutes Intravenous Every 12 hours 02/07/24 1229 02/08/24 0725   02/07/24 0915  vancomycin  (VANCOREADY) IVPB 1500 mg/300 mL        1,500 mg 150 mL/hr over 120 Minutes Intravenous  Once 02/07/24 0823 02/07/24 1125   02/02/24 0400  micafungin  (MYCAMINE ) 100 mg in sodium chloride  0.9 % 100 mL IVPB        100 mg 105 mL/hr over 1 Hours  Intravenous Daily 02/02/24 0248 03/15/24 1400   02/02/24 0400  meropenem  (MERREM ) 1,000 mg in sodium chloride  0.9 % 100 mL IVPB        1,000 mg 200 mL/hr over 30 Minutes Intravenous Every 8 hours 02/02/24 0248 03/15/24 1400   02/02/24 0315  vancomycin  (VANCOCIN ) IVPB 1000 mg/200 mL premix        1,000 mg 200 mL/hr over 60 Minutes Intravenous  Once 02/02/24 0228 02/02/24 0510   01/31/24 1800  piperacillin -tazobactam (ZOSYN ) IVPB 3.375 g  Status:  Discontinued        3.375 g 12.5 mL/hr over 240 Minutes Intravenous Every 8 hours 01/31/24 1639 02/02/24 0248   01/31/24 1745  fluconazole  (DIFLUCAN ) IVPB 400 mg  Status:  Discontinued        400 mg 100 mL/hr over 120 Minutes Intravenous Every 24 hours 01/31/24 1659 02/02/24 0248   01/22/24 1000  fluconazole  (DIFLUCAN ) IVPB 400 mg       Placed in Followed by Linked Group   400 mg 100 mL/hr over 120 Minutes Intravenous Every 24 hours 01/21/24 0956 01/30/24 1134   01/21/24 1045  fluconazole  (DIFLUCAN ) IVPB 800 mg       Placed in Followed by Linked Group   800 mg 200 mL/hr over 120 Minutes Intravenous  Once 01/21/24 0956 01/21/24 1441   01/21/24 0945  piperacillin -tazobactam (ZOSYN ) IVPB 3.375 g        3.375 g 12.5 mL/hr over 240 Minutes Intravenous Every 8 hours 01/21/24 0920 01/30/24 2216   01/16/24 1510  ceFAZolin  (ANCEF ) 2-4 GM/100ML-% IVPB       Note to Pharmacy: Cindie Pizza: cabinet override      01/16/24 1510 01/17/24 0314   01/15/24 0645  ceFAZolin  (ANCEF ) IVPB 2g/100 mL premix       Placed in And Linked Group   2 g 200 mL/hr over 30 Minutes Intravenous On call to O.R. 01/15/24 0641 01/15/24 1700   01/15/24 0645  metroNIDAZOLE  (FLAGYL ) IVPB 500 mg       Placed in And Linked Group   500 mg 100 mL/hr over 60 Minutes Intravenous On call to O.R. 01/15/24 9358 01/15/24 0910   01/15/24 9357  ceFAZolin  (ANCEF ) IVPB 2g/100 mL premix  Status:  Discontinued        2 g 200 mL/hr over 30 Minutes Intravenous 30 min pre-op 01/15/24 9357  01/15/24 0644       Assessment/Plan: Mr. Vandervelden is a 68 yo male with gastric cardia adenocarcinoma, s/p total gastrectomy, with distal esophagectomy and roux-en-Y esophagojejunostomy on 9/3. S/p takeback by thoracic surgery for RATS repair of anastomotic leak on 9/4. S/p EGD with placement of esophageal stent on 9/11. S/p EGD with repositioning of esophageal stent 9/25.  - Has had respiratory arrest x2 associated with hypercarbia. Extubated 9/27, now on RA - Pain control: scheduled tylenol , prn oxycodone . - Hypertension: home hydrochlorothiazide, metoprolol  62.5mg  BID   - EJ leak: S/p esophageal stent placement. Pleural JP to remain in place. On antibiotics and antifungal coverage per thoracic, will need 6 weeks total.  Thoracic planning for stent exchange at 6 weeks. Okay for discharge to [facility] prior to stent exchange if accepted.  - FEN: Continue J tube feeds at goal. Patient is tolerating and having bowel function. FWF interval now 30q6h given hyponatremia after initial correction of hypernatremia. AM BMP pending  - ID: On broad-spectrum abx and antifungal coverage (Merrem  and Micafungin ) for treatment of aspiration pneumonia and EJ leak. No good enteral options per pharmacy in addition to concern for TF interfering with absorption. AM CBC pending - A-fib: remains in sinus rhythm. On therapeutic lovenox . - Code status: back to full code per patient wishes - Dispo: 4NP, PT recommending LTACH at discharge. Plan for [facility] discharge then to SNF given need for continued needs for IV antibiotics. Awaiting placement.   LOS: 47 days    Orie Silversmith, MD [facility]  03/02/2024

## 2024-03-02 NOTE — Progress Notes (Signed)
 Nutrition Follow-up  DOCUMENTATION CODES:   Severe malnutrition in context of chronic illness  INTERVENTION:  Continue tube feeding via J-tube: Continue Osmolite 1.5 @ 65 ml/h (1560 ml per day) Prosource TF20 60 ml BID   Provides 2500 kcal, 137 gm protein, 1185 ml free water  daily   Monitor for diet advancement   NUTRITION DIAGNOSIS:   Severe Malnutrition related to cancer and cancer related treatments as evidenced by severe muscle depletion, moderate fat depletion, energy intake < or equal to 75% for > or equal to 1 month, percent weight loss (has lost 28 lbs, 15% in 6 months). - Ongoing  GOAL:   Patient will meet greater than or equal to 90% of their needs - Progressing   MONITOR:   Diet advancement, TF tolerance, Labs, I & O's, Skin  REASON FOR ASSESSMENT:   Consult Assessment of nutrition requirement/status, Enteral/tube feeding initiation and management  ASSESSMENT:  Mr. Liew is a 68 yo male who was diagnosed with uT2N0 adenocarcinoma of the gastric cardia earlier this year. EUS showed evidence of invasion into the GE junction. The patient completed 3 cycles neoadjuvant FLOT with some complications, and was not able to tolerate further chemotherapy. Presented to [facility] for surgery now post op total gastrectomy with distal esophagectomy, roux-en-Y esophagojejunostomy, J-tube. PMH of T2DM, GERD, HTN, a-fib, hernia repair.  9/3 - total gastrectomy with distal esophagectomy, roux-en-Y esophagojejunostomy, J-tube, chest tube, NGT placed to lIS  9/4 - s/p Thoracoscopy repair of esophageal leak using Myriad Matrix  9/6 - transferred to ICU for hypoxia and increased o2 requirements 9/7 - Trickle tube feeds started, 20 ml/hr 9/8 - Tube feeds advanced to 30 ml/hr, transferred to progressive  9/9 - Tube feeds advanced to 40 ml/hr increase by 10 ml every 6 hours until goal of 60 ml/hr 9/10 - TF held 9/11 - Back to OR for leak, esophageal stent placed, intubated 9/12 -  Extubated  9/20 - cardiac arrest with suspected aspiration event 9/25 - arrest overnight, ROSC with Epi; intubated; TF held 9/27 - extubated; TF resumed at trickle 9/29 - TF to goal, TF increased to 65 ml/hr for wt loss 10/3 - Transferred to progressive    Continues to tolerate J-tube feeds at goal. Having BM, has FMS. Pt continues to be delirious speech mumbled. No N/V.    Pt's weight appears stated on admission.Weight has fluctuated since admission due to fluid/drains. Difficult to access amount of weight loss but overall pt appeared to be losing weight, now weight stable. New weight of 73.2 kg today. Monitor weight trends, may need to increase tube feeding rate again if pt continues to lose weight again.  Some weight loss is to be expected for pt being bedbound.    Disposition: Plan for LTAC discharge then to SNF given need for continued needs for IV antibiotics.    Intake/Output Summary (Last 24 hours) at 03/02/2024 1452 Last data filed at 03/02/2024 1213 Gross per 24 hour  Intake 4882.4 ml  Output 1305 ml  Net 3577.4 ml   Drains/Lines: JP Chest tube: 5 ml x 24 hours  J-tube FMS: 100 ml x 24 hours   Admit weight: 80.7 kg - Stated?  Current weight: 73.2 kg   Average Meal Intake: NPO  Nutritionally Relevant Medications: Scheduled Meds:  feeding supplement (PROSource TF20)  60 mL Per Tube BID   free water   30 mL Per Tube Q6H   hydrochlorothiazide  25 mg Per Tube Daily   insulin  aspart  0-9 Units  Subcutaneous Q6H   Continuous Infusions:  feeding supplement (OSMOLITE 1.5 CAL) 1,000 mL (03/02/24 1337)   meropenem  (MERREM ) IV 1,000 mg (03/02/24 1338)   micafungin  (MYCAMINE ) 100 mg in sodium chloride  0.9 % 100 mL IVPB Stopped (03/02/24 1052)   Labs Reviewed: CBG ranges from 108-148 mg/dL over the last 24 hours HgbA1c 5.3  Diet Order:   Diet Order             Diet NPO time specified  Diet effective now                   EDUCATION NEEDS:   Education needs have  been addressed  Skin:  Skin Assessment: Skin Integrity Issues: Skin Integrity Issues:: Incisions, Other (Comment) Incisions: 9/3 abdomen, 9/4 rt chest Other: right back wound, left buttocks wound  Last BM:  10/19 100 ml via FMS  Height:   Ht Readings from Last 1 Encounters:  01/16/24 5' 11 (1.803 m)    Weight:   Wt Readings from Last 1 Encounters:  03/02/24 73.2 kg    Ideal Body Weight:  78.2 kg  BMI:  Body mass index is 22.51 kg/m.  Estimated Nutritional Needs:   Kcal:  2300-2500 kcal  Protein:  120-140 gm  Fluid:  >/=2L/day   Olivia Kenning, RD Registered Dietitian  See Amion for more information

## 2024-03-02 NOTE — Plan of Care (Signed)
  Problem: Education: Goal: Ability to describe self-care measures that may prevent or decrease complications (Diabetes Survival Skills Education) will improve Outcome: Progressing Goal: Individualized Educational Video(s) Outcome: Progressing   Problem: Coping: Goal: Ability to adjust to condition or change in health will improve Outcome: Progressing   Problem: Fluid Volume: Goal: Ability to maintain a balanced intake and output will improve Outcome: Progressing   Problem: Health Behavior/Discharge Planning: Goal: Ability to identify and utilize available resources and services will improve Outcome: Progressing Goal: Ability to manage health-related needs will improve Outcome: Progressing   Problem: Metabolic: Goal: Ability to maintain appropriate glucose levels will improve Outcome: Progressing   Problem: Nutritional: Goal: Maintenance of adequate nutrition will improve Outcome: Progressing Goal: Progress toward achieving an optimal weight will improve Outcome: Progressing   Problem: Skin Integrity: Goal: Risk for impaired skin integrity will decrease Outcome: Progressing   Problem: Tissue Perfusion: Goal: Adequacy of tissue perfusion will improve Outcome: Progressing   Problem: Education: Goal: Knowledge of General Education information will improve Description: Including pain rating scale, medication(s)/side effects and non-pharmacologic comfort measures Outcome: Progressing   Problem: Clinical Measurements: Goal: Will remain free from infection Outcome: Progressing Goal: Respiratory complications will improve Outcome: Progressing   Problem: Activity: Goal: Risk for activity intolerance will decrease Outcome: Progressing   Problem: Pain Managment: Goal: General experience of comfort will improve and/or be controlled Outcome: Progressing   Problem: Safety: Goal: Ability to remain free from injury will improve Outcome: Progressing

## 2024-03-03 LAB — RENAL FUNCTION PANEL
Albumin: 2.1 g/dL — ABNORMAL LOW (ref 3.5–5.0)
Anion gap: 8 (ref 5–15)
BUN: 38 mg/dL — ABNORMAL HIGH (ref 8–23)
CO2: 28 mmol/L (ref 22–32)
Calcium: 9.2 mg/dL (ref 8.9–10.3)
Chloride: 98 mmol/L (ref 98–111)
Creatinine, Ser: 0.7 mg/dL (ref 0.61–1.24)
GFR, Estimated: >60 mL/min (ref >60)
Glucose, Bld: 151 mg/dL — ABNORMAL HIGH (ref 70–99)
Phosphorus: 4 mg/dL (ref 2.5–4.6)
Potassium: 4.2 mmol/L (ref 3.5–5.1)
Sodium: 134 mmol/L — ABNORMAL LOW (ref 135–145)

## 2024-03-03 LAB — CBC
HCT: 31.4 % — ABNORMAL LOW (ref 39.0–52.0)
Hemoglobin: 10.2 g/dL — ABNORMAL LOW (ref 13.0–17.0)
MCH: 29.4 pg (ref 26.0–34.0)
MCHC: 32.5 g/dL (ref 30.0–36.0)
MCV: 90.5 fL (ref 80.0–100.0)
Platelets: 551 K/uL — ABNORMAL HIGH (ref 150–400)
RBC: 3.47 MIL/uL — ABNORMAL LOW (ref 4.22–5.81)
RDW: 16.7 % — ABNORMAL HIGH (ref 11.5–15.5)
WBC: 13.5 K/uL — ABNORMAL HIGH (ref 4.0–10.5)
nRBC: 0 % (ref 0.0–0.2)

## 2024-03-03 LAB — GLUCOSE, CAPILLARY
Glucose-Capillary: 121 mg/dL — ABNORMAL HIGH (ref 70–99)
Glucose-Capillary: 128 mg/dL — ABNORMAL HIGH (ref 70–99)
Glucose-Capillary: 153 mg/dL — ABNORMAL HIGH (ref 70–99)
Glucose-Capillary: 156 mg/dL — ABNORMAL HIGH (ref 70–99)
Glucose-Capillary: 157 mg/dL — ABNORMAL HIGH (ref 70–99)
Glucose-Capillary: 95 mg/dL (ref 70–99)

## 2024-03-03 LAB — MAGNESIUM: Magnesium: 2 mg/dL (ref 1.7–2.4)

## 2024-03-03 NOTE — Plan of Care (Signed)
  Problem: Education: Goal: Ability to describe self-care measures that may prevent or decrease complications (Diabetes Survival Skills Education) will improve 03/03/2024 1742 by Okey Lauree RAMAN, RN Outcome: Progressing 03/03/2024 1742 by Okey Lauree RAMAN, RN Outcome: Progressing Goal: Individualized Educational Video(s) Outcome: Progressing   Problem: Coping: Goal: Ability to adjust to condition or change in health will improve Outcome: Progressing   Problem: Fluid Volume: Goal: Ability to maintain a balanced intake and output will improve Outcome: Progressing   Problem: Health Behavior/Discharge Planning: Goal: Ability to identify and utilize available resources and services will improve Outcome: Progressing Goal: Ability to manage health-related needs will improve Outcome: Progressing   Problem: Metabolic: Goal: Ability to maintain appropriate glucose levels will improve Outcome: Progressing   Problem: Nutritional: Goal: Maintenance of adequate nutrition will improve Outcome: Progressing Goal: Progress toward achieving an optimal weight will improve Outcome: Progressing   Problem: Skin Integrity: Goal: Risk for impaired skin integrity will decrease Outcome: Progressing   Problem: Tissue Perfusion: Goal: Adequacy of tissue perfusion will improve Outcome: Progressing   Problem: Education: Goal: Knowledge of General Education information will improve Description: Including pain rating scale, medication(s)/side effects and non-pharmacologic comfort measures Outcome: Progressing   Problem: Clinical Measurements: Goal: Will remain free from infection Outcome: Progressing Goal: Respiratory complications will improve Outcome: Progressing   Problem: Activity: Goal: Risk for activity intolerance will decrease Outcome: Progressing   Problem: Pain Managment: Goal: General experience of comfort will improve and/or be controlled Outcome: Progressing   Problem:  Safety: Goal: Ability to remain free from injury will improve Outcome: Progressing

## 2024-03-03 NOTE — Progress Notes (Signed)
 Patient ID: [name], male   DOB: 07-May-1956, 68 y.o.   MRN: [id number] 26 Days Post-Op    Subjective: Reports he is doing OK, asked if I had heard from [facility] but I am not sure who that is ROS negative except as listed above. Objective: Vital signs in last 24 hours: Temp:  [98.1 F (36.7 C)-98.5 F (36.9 C)] 98.5 F (36.9 C) (10/21 0742) Pulse Rate:  [88-99] 95 (10/21 0742) Resp:  [13-21] 21 (10/21 0742) BP: (105-149)/(72-93) 147/72 (10/21 0742) SpO2:  [96 %-100 %] 99 % (10/21 0742) Weight:  [72.3 kg] 72.3 kg (10/21 0500) Last BM Date : 03/03/24  Intake/Output from previous day: 10/20 0701 - 10/21 0700 In: 1070 [NG/GT:835; IV Piggyback:205] Out: 1050 [Urine:700; Stool:350] Intake/Output this shift: No intake/output data recorded.  General appearance: alert and cooperative Resp: clear to auscultation bilaterally GI: soft, he does not want me to examine closely  Lab Results: CBC  Recent Labs    03/02/24 2106 03/03/24 0214  WBC 11.7* 13.5*  HGB 10.3* 10.2*  HCT 31.3* 31.4*  PLT 546* 551*   BMET Recent Labs    03/02/24 2106 03/03/24 0215  NA 135 134*  K 3.9 4.2  CL 97* 98  CO2 27 28  GLUCOSE 152* 151*  BUN 31* 38*  CREATININE 0.68 0.70  CALCIUM  9.1 9.2   PT/INR No results for input(s): LABPROT, INR in the last 72 hours. ABG No results for input(s): PHART, HCO3 in the last 72 hours.  Invalid input(s): PCO2, PO2  Studies/Results: No results found.  Anti-infectives: Anti-infectives (From admission, onward)    Start     Dose/Rate Route Frequency Ordered Stop   02/07/24 2000  vancomycin  (VANCOCIN ) IVPB 1000 mg/200 mL premix  Status:  Discontinued        1,000 mg 200 mL/hr over 60 Minutes Intravenous Every 12 hours 02/07/24 1229 02/08/24 0725   02/07/24 0915  vancomycin  (VANCOREADY) IVPB 1500 mg/300 mL        1,500 mg 150 mL/hr over 120 Minutes Intravenous  Once 02/07/24 0823 02/07/24 1125   02/02/24 0400  micafungin  (MYCAMINE ) 100  mg in sodium chloride  0.9 % 100 mL IVPB        100 mg 105 mL/hr over 1 Hours Intravenous Daily 02/02/24 0248 03/15/24 1400   02/02/24 0400  meropenem  (MERREM ) 1,000 mg in sodium chloride  0.9 % 100 mL IVPB        1,000 mg 200 mL/hr over 30 Minutes Intravenous Every 8 hours 02/02/24 0248 03/15/24 1400   02/02/24 0315  vancomycin  (VANCOCIN ) IVPB 1000 mg/200 mL premix        1,000 mg 200 mL/hr over 60 Minutes Intravenous  Once 02/02/24 0228 02/02/24 0510   01/31/24 1800  piperacillin -tazobactam (ZOSYN ) IVPB 3.375 g  Status:  Discontinued        3.375 g 12.5 mL/hr over 240 Minutes Intravenous Every 8 hours 01/31/24 1639 02/02/24 0248   01/31/24 1745  fluconazole  (DIFLUCAN ) IVPB 400 mg  Status:  Discontinued        400 mg 100 mL/hr over 120 Minutes Intravenous Every 24 hours 01/31/24 1659 02/02/24 0248   01/22/24 1000  fluconazole  (DIFLUCAN ) IVPB 400 mg       Placed in Followed by Linked Group   400 mg 100 mL/hr over 120 Minutes Intravenous Every 24 hours 01/21/24 0956 01/30/24 1134   01/21/24 1045  fluconazole  (DIFLUCAN ) IVPB 800 mg       Placed in Followed by Linked Group  800 mg 200 mL/hr over 120 Minutes Intravenous  Once 01/21/24 0956 01/21/24 1441   01/21/24 0945  piperacillin -tazobactam (ZOSYN ) IVPB 3.375 g        3.375 g 12.5 mL/hr over 240 Minutes Intravenous Every 8 hours 01/21/24 0920 01/30/24 2216   01/16/24 1510  ceFAZolin  (ANCEF ) 2-4 GM/100ML-% IVPB       Note to Pharmacy: Cindie Pizza: cabinet override      01/16/24 1510 01/17/24 0314   01/15/24 0645  ceFAZolin  (ANCEF ) IVPB 2g/100 mL premix       Placed in And Linked Group   2 g 200 mL/hr over 30 Minutes Intravenous On call to O.R. 01/15/24 0641 01/15/24 1700   01/15/24 0645  metroNIDAZOLE  (FLAGYL ) IVPB 500 mg       Placed in And Linked Group   500 mg 100 mL/hr over 60 Minutes Intravenous On call to O.R. 01/15/24 9358 01/15/24 0910   01/15/24 9357  ceFAZolin  (ANCEF ) IVPB 2g/100 mL premix  Status:  Discontinued         2 g 200 mL/hr over 30 Minutes Intravenous 30 min pre-op 01/15/24 9357 01/15/24 0644       Assessment/Plan: [name] is a 68 yo male with gastric cardia adenocarcinoma, s/p total gastrectomy, with distal esophagectomy and roux-en-Y esophagojejunostomy on 9/3. S/p takeback by thoracic surgery for RATS repair of anastomotic leak on 9/4. S/p EGD with placement of esophageal stent on 9/11. S/p EGD with repositioning of esophageal stent 9/25.   - Has had respiratory arrest x2 associated with hypercarbia. Extubated 9/27, now on RA - Clonidine  patch discontinued, consider risperdal  ODT - Pain control: scheduled tylenol , prn oxycodone . - Hypertension: home hydrochlorothiazide, metoprolol  62.5mg  BID     - EJ leak: S/p esophageal stent placement. Pleural JP to remain in place. On antibiotics and antifungal coverage per thoracic, will need 6 weeks total.  Thoracic planning for stent exchange at 6 weeks. Okay for discharge to [facility] prior to stent exchange if accepted.   - FEN: Continue J tube feeds at goal. Patient is tolerating and having bowel function. FWF interval now 30q6h given hyponatremia after initial correction of hypernatremia.   - ID: On broad-spectrum abx and antifungal coverage (Merrem  and Micafungin ) for treatment of aspiration pneumonia and EJ leak. No good enteral options per pharmacy in addition to concern for TF interfering with absorption. AM WBC 13.5 and afeb - A-fib: remains in sinus rhythm. On therapeutic lovenox . - Code status: back to full code per patient wishes - Dispo: 4NP, labs OK today. PT recommending SNF at discharge, SW working on placement. Plan for [facility] discharge then to SNF given need for continued needs for IV antibiotics. 6w total course planned  LOS: 48 days    Dann Hummer, MD, MPH, FACS Trauma & General Surgery Use [email or web address] to contact on call provider  03/03/2024

## 2024-03-03 NOTE — Progress Notes (Signed)
 Occupational Therapy Treatment Patient Details Name: [name] MRN: [id number] DOB: 11/11/1955 Today's Date: 03/03/2024   History of present illness 69 y.o. male presents to [facility] hospital on 01/15/2024 with gastric CA. Pt underwent total gastrectomy with omentectomy and D2 lymphadenectomy, esophagojejunostomy with Jtube placement, splenic biopsy on 9/3. Pt developed bilious output into chest tube on 9/4, returned to OR for EGD and thoracoscopy with application of wound matrix to EJ anastomosis. ETT 9/11-9/12 for desats and unresponsiveness. S/p EGD and esophageal stent placement 9/11. Cardiac arrest 9/19. Re-ett 9/19-9/22. 9/26 PEA respiratory failure with intubation extubation 9/27.  Psychiatry evaluation to determine competency and per MD note, at time of evaluation, patient lacked medical decisional capacity regarding disposition... Recommend speaking with HCPOA regarding any dispositional planning and possibly consult palliative care to clarify GOC. PMH includes asthma, OA, COPD, GERD, DMII, MDD, emphysema.   OT comments  Pt with slow, steady progression toward established OT goals. Pt needing up to min cues for safety during mobility this session. Challenging activity tolerance with pt performing serial STS as well as SPT back to bed using RW with CGA and cues for hand placement. Pt oriented this session and conversational. Engaged in IS with cues for technique. Patient will benefit from continued inpatient follow up therapy, <3 hours/day       If plan is discharge home, recommend the following:  A lot of help with walking and/or transfers;Two people to help with walking and/or transfers;A lot of help with bathing/dressing/bathroom;Two people to help with bathing/dressing/bathroom;Assistance with cooking/housework;Assist for transportation;Help with stairs or ramp for entrance   Equipment Recommendations  Other (comment)    Recommendations for Other Services      Precautions /  Restrictions Precautions Precautions: Fall Recall of Precautions/Restrictions: Impaired Precaution/Restrictions Comments: R JP drain x 1, J tube, R port, tube feeds paused during session; watch orthostatics       Mobility Bed Mobility Overal bed mobility: Needs Assistance Bed Mobility: Sit to Supine       Sit to supine: Contact guard assist   General bed mobility comments: cues for initiation    Transfers Overall transfer level: Needs assistance Equipment used: Rolling walker (2 wheels) Transfers: Sit to/from Stand, Bed to chair/wheelchair/BSC Sit to Stand: Contact guard assist     Step pivot transfers: Contact guard assist     General transfer comment: cues for hand placement, CGA for safety     Balance Overall balance assessment: Needs assistance Sitting-balance support: Single extremity supported, Feet supported Sitting balance-Leahy Scale: Fair     Standing balance support: Bilateral upper extremity supported, During functional activity Standing balance-Leahy Scale: Poor                             ADL either performed or assessed with clinical judgement   ADL Overall ADL's : Needs assistance/impaired     Grooming: Wash/dry face;Set up;Sitting   Upper Body Bathing: Set up;Sitting               Toilet Transfer: Contact guard assist;Stand-pivot;Rolling walker (2 wheels)           Functional mobility during ADLs: Contact guard assist;Rolling walker (2 wheels)      Extremity/Trunk Assessment Upper Extremity Assessment Upper Extremity Assessment: Generalized weakness   Lower Extremity Assessment Lower Extremity Assessment: Defer to PT evaluation        Vision   Vision Assessment?: Wears glasses for reading   Perception Perception Perception: Not tested  Praxis Praxis Praxis: Not tested   Communication Communication Communication: Impaired Factors Affecting Communication: Reduced clarity of speech;Difficulty expressing  self   Cognition Arousal: Alert Behavior During Therapy: Flat affect, WFL for tasks assessed/performed Cognition: No family/caregiver present to determine baseline     Awareness: Online awareness impaired Memory impairment (select all impairments): Short-term memory, Working memory Attention impairment (select first level of impairment): Sustained attention Executive functioning impairment (select all impairments): Initiation, Reasoning, Problem solving OT - Cognition Comments: pt awake throughout session with good maintenance of level of arousal throughout session. Follows one step commands with increased time and cues for initiation. pt seems to mask cognitive deficits with humor at times. pt oriented this session, and able to follow one step commands only                 Following commands: Impaired Following commands impaired: Follows one step commands inconsistently, Follows one step commands with increased time      Cueing   Cueing Techniques: Verbal cues, Tactile cues  Exercises      Shoulder Instructions       General Comments BGP 108/75 sitting in chair after bringing BLE down from reclining position and pt with dizziness. SBP in 120s on arrival    Pertinent Vitals/ Pain       Pain Assessment Pain Assessment: Faces Faces Pain Scale: Hurts little more Pain Location: abdomen Pain Descriptors / Indicators: Discomfort, Sore Pain Intervention(s): Limited activity within patient's tolerance, Monitored during session  Home Living                                          Prior Functioning/Environment              Frequency  Min 2X/week        Progress Toward Goals  OT Goals(current goals can now be found in the care plan section)  Progress towards OT goals: Progressing toward goals  Acute Rehab OT Goals Patient Stated Goal: go home OT Goal Formulation: With patient Time For Goal Achievement: 03/10/24 Potential to Achieve Goals:  Fair ADL Goals Pt Will Perform Grooming: with contact guard assist;standing Pt Will Perform Lower Body Dressing: with min assist;sit to/from stand Pt Will Transfer to Toilet: with contact guard assist;ambulating Additional ADL Goal #1: pt will follow 3 step commands min cues.  Plan      Co-evaluation                 AM-PAC OT 6 Clicks Daily Activity     Outcome Measure   Help from another person eating meals?: A Little Help from another person taking care of personal grooming?: A Little Help from another person toileting, which includes using toliet, bedpan, or urinal?: A Lot Help from another person bathing (including washing, rinsing, drying)?: A Lot Help from another person to put on and taking off regular upper body clothing?: A Little Help from another person to put on and taking off regular lower body clothing?: A Lot 6 Click Score: 15    End of Session Equipment Utilized During Treatment: Gait belt;Rolling walker (2 wheels)  OT Visit Diagnosis: Unsteadiness on feet (R26.81);Muscle weakness (generalized) (M62.81);Other symptoms and signs involving cognitive function;Pain   Activity Tolerance Patient tolerated treatment well   Patient Left in bed;with call bell/phone within reach;with bed alarm set;Other (comment) (posey)   Nurse Communication Mobility status (low BPs)  Time: 8689-8661 OT Time Calculation (min): 28 min  Charges: OT General Charges $OT Visit: 1 Visit OT Treatments $Self Care/Home Management : 8-22 mins $Therapeutic Activity: 8-22 mins  Elma JONETTA Penner, OTD, OTR/L [facility] Acute Rehabilitation Office: [phone]   Elma JONETTA Penner 03/03/2024, 2:50 PM

## 2024-03-04 LAB — RENAL FUNCTION PANEL
Albumin: 2.2 g/dL — ABNORMAL LOW (ref 3.5–5.0)
Anion gap: 11 (ref 5–15)
BUN: 38 mg/dL — ABNORMAL HIGH (ref 8–23)
CO2: 28 mmol/L (ref 22–32)
Calcium: 9.4 mg/dL (ref 8.9–10.3)
Chloride: 97 mmol/L — ABNORMAL LOW (ref 98–111)
Creatinine, Ser: 0.74 mg/dL (ref 0.61–1.24)
GFR, Estimated: >60 mL/min (ref >60)
Glucose, Bld: 148 mg/dL — ABNORMAL HIGH (ref 70–99)
Phosphorus: 3.7 mg/dL (ref 2.5–4.6)
Potassium: 3.9 mmol/L (ref 3.5–5.1)
Sodium: 136 mmol/L (ref 135–145)

## 2024-03-04 LAB — CBC
HCT: 31.6 % — ABNORMAL LOW (ref 39.0–52.0)
Hemoglobin: 10.3 g/dL — ABNORMAL LOW (ref 13.0–17.0)
MCH: 29.1 pg (ref 26.0–34.0)
MCHC: 32.6 g/dL (ref 30.0–36.0)
MCV: 89.3 fL (ref 80.0–100.0)
Platelets: 533 K/uL — ABNORMAL HIGH (ref 150–400)
RBC: 3.54 MIL/uL — ABNORMAL LOW (ref 4.22–5.81)
RDW: 16.7 % — ABNORMAL HIGH (ref 11.5–15.5)
WBC: 11.2 K/uL — ABNORMAL HIGH (ref 4.0–10.5)
nRBC: 0 % (ref 0.0–0.2)

## 2024-03-04 LAB — MAGNESIUM: Magnesium: 2 mg/dL (ref 1.7–2.4)

## 2024-03-04 LAB — GLUCOSE, CAPILLARY
Glucose-Capillary: 146 mg/dL — ABNORMAL HIGH (ref 70–99)
Glucose-Capillary: 162 mg/dL — ABNORMAL HIGH (ref 70–99)
Glucose-Capillary: 152 mg/dL — ABNORMAL HIGH (ref 70–99)

## 2024-03-04 NOTE — TOC Progression Note (Signed)
 Transition of Care (TOC) - Progression Note  Rayfield Gobble RN,BSN Inpatient Care Management Unit 4NP (Non Trauma)- RN Case Manager See Treatment Team for direct Phone #   Patient Details  Name: [name] MRN: [id number] Date of Birth: [birth year]  Transition of Care [facility]) CM/SW Contact  Gobble Rayfield Hurst, RN Phone Number: 03/04/2024, 11:08 AM  Clinical Narrative:    Cm received notice from [employer] yesterday 10/21 that insurance has denied for [facility] with no option for P2P. Per insurance they do not support admission to [facility] level of care.   Per attending team pt is stable for SNF level of care.   Call made to sister [name] to discuss, per TC [name] voiced that she has not called insurance regarding denial and will not likely do appeal as she understands insurance is not likely to overturn the denial for LTACH. [name] is agreeable to start SNF search and request to begin search in [facility].  CSW aware and will do SNF bed search- bed offers will be provided to sister when available by CSW.  SNF process explained to sister and she voiced understanding.   IP CM team will continue to follow.    Expected Discharge Plan: Skilled Nursing Facility Barriers to Discharge: Continued Medical Work up, English as a second language teacher, SNF Pending bed offer               Expected Discharge Plan and Services In-house Referral: Clinical Social Work Discharge Planning Services: [employer] Consult Post Acute Care Choice: Skilled Nursing Facility Living arrangements for the past 2 months: Single Family Home                                       Social Drivers of Health (SDOH) Interventions SDOH Screenings   Food Insecurity: No Food Insecurity (01/28/2024)  Housing: Low Risk  (01/28/2024)  Transportation Needs: No Transportation Needs (01/28/2024)  Utilities: Not At Risk (01/28/2024)  Depression (PHQ2-9): Low Risk  (11/25/2023)  Financial Resource Strain: Low Risk   (08/20/2023)  Social Connections: Unknown (01/28/2024)  Stress: No Stress Concern Present (08/20/2023)  Tobacco Use: High Risk (01/28/2024)    Readmission Risk Interventions    11/05/2023   12:23 PM  Readmission Risk Prevention Plan  Transportation Screening Complete  PCP or Specialist Appt within 3-5 Days Complete  HRI or Home Care Consult Complete  Social Work Consult for Recovery Care Planning/Counseling Complete  Palliative Care Screening Complete  Medication Review Oceanographer) Complete

## 2024-03-04 NOTE — Progress Notes (Signed)
 Trauma/Critical Care Follow Up Note  Subjective:    Overnight Issues:   Objective:  Vital signs for last 24 hours: Temp:  [97.5 F (36.4 C)-98.6 F (37 C)] 97.5 F (36.4 C) (10/22 1213) Pulse Rate:  [94-111] 94 (10/22 1213) Resp:  [15-20] 15 (10/22 1213) BP: (125-156)/(82-96) 138/85 (10/22 1213) SpO2:  [97 %-98 %] 97 % (10/22 1213) Weight:  [72.7 kg] 72.7 kg (10/22 0249)  Hemodynamic parameters for last 24 hours:    Intake/Output from previous day: 10/21 0701 - 10/22 0700 In: 120 [NG/GT:120] Out: 600 [Urine:600]  Intake/Output this shift: Total I/O In: 30 [NG/GT:30] Out: 5 [Drains:5]  Vent settings for last 24 hours:    Physical Exam:  Gen: comfortable, no distress Neuro: follows commands, alert, communicative HEENT: PERRL Neck: supple CV: RRR Pulm: unlabored breathing on RA Abd: soft, NT  , no recent BM GU: urine clear and yellow, +spontaneous voids Extr: wwp, no edema  Results for orders placed or performed during the hospital encounter of 01/15/24 (from the past 24 hours)  Glucose, capillary     Status: Abnormal   Collection Time: 03/03/24  5:58 PM  Result Value Ref Range   Glucose-Capillary 156 (H) 70 - 99 mg/dL  Glucose, capillary     Status: Abnormal   Collection Time: 03/03/24 11:07 PM  Result Value Ref Range   Glucose-Capillary 128 (H) 70 - 99 mg/dL  Glucose, capillary     Status: Abnormal   Collection Time: 03/04/24  4:55 AM  Result Value Ref Range   Glucose-Capillary 146 (H) 70 - 99 mg/dL  CBC     Status: Abnormal   Collection Time: 03/04/24  5:42 AM  Result Value Ref Range   WBC 11.2 (H) 4.0 - 10.5 K/uL   RBC 3.54 (L) 4.22 - 5.81 MIL/uL   Hemoglobin 10.3 (L) 13.0 - 17.0 g/dL   HCT 68.3 (L) 60.9 - 47.9 %   MCV 89.3 80.0 - 100.0 fL   MCH 29.1 26.0 - 34.0 pg   MCHC 32.6 30.0 - 36.0 g/dL   RDW 83.2 (H) 88.4 - 84.4 %   Platelets 533 (H) 150 - 400 K/uL   nRBC 0.0 0.0 - 0.2 %  Magnesium      Status: None   Collection Time: 03/04/24  5:42  AM  Result Value Ref Range   Magnesium  2.0 1.7 - 2.4 mg/dL  Renal function panel     Status: Abnormal   Collection Time: 03/04/24  5:43 AM  Result Value Ref Range   Sodium 136 135 - 145 mmol/L   Potassium 3.9 3.5 - 5.1 mmol/L   Chloride 97 (L) 98 - 111 mmol/L   CO2 28 22 - 32 mmol/L   Glucose, Bld 148 (H) 70 - 99 mg/dL   BUN 38 (H) 8 - 23 mg/dL   Creatinine, Ser 9.25 0.61 - 1.24 mg/dL   Calcium  9.4 8.9 - 10.3 mg/dL   Phosphorus 3.7 2.5 - 4.6 mg/dL   Albumin  2.2 (L) 3.5 - 5.0 g/dL   GFR, Estimated >39 >39 mL/min   Anion gap 11 5 - 15  Glucose, capillary     Status: Abnormal   Collection Time: 03/04/24 12:11 PM  Result Value Ref Range   Glucose-Capillary 162 (H) 70 - 99 mg/dL    Assessment & Plan: The plan of care was discussed with the bedside nurse for the day, who is in agreement with this plan and no additional concerns were raised.   Present on Admission:  Adenocarcinoma of gastric cardia (HCC)    LOS: 49 days   Additional comments:I reviewed the patient's new clinical lab test results.   and I reviewed the patients new imaging test results.    [name] is a 68 yo male with gastric cardia adenocarcinoma, s/p total gastrectomy, with distal esophagectomy and roux-en-Y esophagojejunostomy on 9/3. S/p takeback by thoracic surgery for RATS repair of anastomotic leak on 9/4. S/p EGD with placement of esophageal stent on 9/11. S/p EGD with repositioning of esophageal stent 9/25.   - Has had respiratory arrest x2 associated with hypercarbia. Extubated 9/27, now on RA - Clonidine  patch discontinued, consider risperdal  ODT - Pain control: scheduled tylenol , prn oxycodone . - Hypertension: home hydrochlorothiazide, metoprolol  62.5mg  BID     - EJ leak: S/p esophageal stent placement. Pleural JP to remain in place. On antibiotics and antifungal coverage per thoracic, will need 6 weeks total.  Thoracic planning for stent exchange at 6 weeks. Okay for discharge to [facility] prior to  stent exchange if accepted.   - FEN: Continue J tube feeds at goal. Patient is tolerating and having bowel function. FWF interval now 30q6h given hyponatremia after initial correction of hypernatremia.   - ID: On broad-spectrum abx and antifungal coverage (Merrem  and Micafungin ) for treatment of aspiration pneumonia and EJ leak. No good enteral options per pharmacy in addition to concern for TF interfering with absorption. AM WBC 13.5 and afeb - A-fib: remains in sinus rhythm. On therapeutic lovenox . - Code status: back to full code per patient wishes - Dispo: 4NP, labs OK today. PT recommending SNF at discharge, SW working on placement. Insurance denied LTACH, will plan for SNF given need for continued needs for IV antibiotics. 6w total course planned  Jon GEANNIE Hanger, MD Trauma & General Surgery Please use [email or web address] to contact on call provider  03/04/2024  *Care during the described time interval was provided by me. I have reviewed this patient's available data, including medical history, events of note, physical examination and test results as part of my evaluation.

## 2024-03-04 NOTE — Progress Notes (Signed)
 Physical Therapy Treatment Patient Details Name: [name] MRN: [id number] DOB: [birth year] Today's Date: 03/04/2024   History of Present Illness 68 y.o. male presents to [facility] hospital on 01/15/2024 with gastric CA. Pt underwent total gastrectomy with omentectomy and D2 lymphadenectomy, esophagojejunostomy with Jtube placement, splenic biopsy on 9/3. Pt developed bilious output into chest tube on 9/4, returned to OR for EGD and thoracoscopy with application of wound matrix to EJ anastomosis. ETT 9/11-9/12 for desats and unresponsiveness. S/p EGD and esophageal stent placement 9/11. Cardiac arrest 9/19. Re-ett 9/19-9/22. 9/26 PEA respiratory failure with intubation extubation 9/27.  Psychiatry evaluation to determine competency and per MD note, at time of evaluation, patient lacked medical decisional capacity regarding disposition... Recommend speaking with HCPOA regarding any dispositional planning and possibly consult palliative care to clarify GOC. PMH includes asthma, OA, COPD, GERD, DMII, MDD, emphysema.    PT Comments  Pt tolerates treatment well, ambulating for multiple trials and increased distances. Pt is consistently alert this session but does still have some memory impairment, reporting not receiving pain medications since 5AM despite having them at 11:53AM per chart. Pt will benefit from consistent mobilization in an effort to maintain arousal and to further improve balance. PT continues to recommend LTACH placement due to ongoing medical management and continued need for PT services.    If plan is discharge home, recommend the following: A lot of help with bathing/dressing/bathroom;Assistance with cooking/housework;Direct supervision/assist for medications management;Direct supervision/assist for financial management;Assist for transportation;Help with stairs or ramp for entrance;Supervision due to cognitive status;A lot of help with walking and/or transfers   Can travel by private vehicle      No  Equipment Recommendations  Wheelchair (measurements PT);Wheelchair cushion (measurements PT);BSC/3in1;Rolling walker (2 wheels)    Recommendations for Other Services       Precautions / Restrictions Precautions Precautions: Fall Recall of Precautions/Restrictions: Impaired Precaution/Restrictions Comments: R JP drain x 1, J tube, R port, tube feeds paused during session; watch orthostatics Restrictions Weight Bearing Restrictions Per Provider Order: No     Mobility  Bed Mobility                    Transfers Overall transfer level: Needs assistance Equipment used: Rolling walker (2 wheels) Transfers: Sit to/from Stand Sit to Stand: Contact guard assist                Ambulation/Gait Ambulation/Gait assistance: Min assist Gait Distance (Feet): 120 Feet (additional trial of 60') Assistive device: Rolling walker (2 wheels) Gait Pattern/deviations: Step-through pattern Gait velocity: reduced Gait velocity interpretation: <1.31 ft/sec, indicative of household ambulator   General Gait Details: slowed step-through gait, increased lateral drift with dristraction or turns   Optometrist     Tilt Bed    Modified Rankin (Stroke Patients Only)       Balance Overall balance assessment: Needs assistance Sitting-balance support: No upper extremity supported, Feet supported Sitting balance-Leahy Scale: Good     Standing balance support: Bilateral upper extremity supported, Reliant on assistive device for balance Standing balance-Leahy Scale: Poor                              Communication Communication Communication: Impaired Factors Affecting Communication: Reduced clarity of speech;Difficulty expressing self  Cognition Arousal: Alert Behavior During Therapy: WFL for tasks assessed/performed   PT - Cognitive impairments: Memory, Safety/Judgement, Awareness  Following commands: Intact      Cueing Cueing Techniques: Verbal cues  Exercises      General Comments General comments (skin integrity, edema, etc.): VSS on RA      Pertinent Vitals/Pain Pain Assessment Pain Assessment: Faces Faces Pain Scale: Hurts little more Pain Location: abdomen Pain Descriptors / Indicators: Grimacing Pain Intervention(s): Monitored during session    Home Living                          Prior Function            PT Goals (current goals can now be found in the care plan section) Acute Rehab PT Goals Patient Stated Goal: to return to independence Progress towards PT goals: Progressing toward goals    Frequency    Min 2X/week      PT Plan      Co-evaluation              AM-PAC PT 6 Clicks Mobility   Outcome Measure  Help needed turning from your back to your side while in a flat bed without using bedrails?: A Little Help needed moving from lying on your back to sitting on the side of a flat bed without using bedrails?: A Little Help needed moving to and from a bed to a chair (including a wheelchair)?: A Little Help needed standing up from a chair using your arms (e.g., wheelchair or bedside chair)?: A Little Help needed to walk in hospital room?: A Little Help needed climbing 3-5 steps with a railing? : Total 6 Click Score: 16    End of Session Equipment Utilized During Treatment: Gait belt Activity Tolerance: Patient tolerated treatment well Patient left: in chair;with call bell/phone within reach;with chair alarm set Nurse Communication: Mobility status PT Visit Diagnosis: Other abnormalities of gait and mobility (R26.89);Muscle weakness (generalized) (M62.81);Pain Pain - Right/Left: Right Pain - part of body:  (flank)     Time: 1340-1411 PT Time Calculation (min) (ACUTE ONLY): 31 min  Charges:    $Gait Training: 23-37 mins PT General Charges $$ ACUTE PT VISIT: 1 Visit                     [name],  PT, DPT Acute Rehabilitation Office [phone]    [name] 03/04/2024, 2:36 PM

## 2024-03-04 NOTE — TOC Progression Note (Signed)
 Transition of Care [facility]) - Progression Note    Patient Details  Name: [name] MRN: [id number] Date of Birth: 02-27-1956  Transition of Care [facility]) CM/SW Contact  Montie LOISE Louder, [place] Phone Number: 03/04/2024, 2:10 PM  Clinical Narrative:     CSW spoke with patient's sister, betty- provide bed offers for short term rehab. She states she will review and inform CSW of her choice.  Montie Louder, MSW, LCSW Clinical Social Worker    Expected Discharge Plan: Skilled Nursing Facility Barriers to Discharge: Continued Medical Work up, English as a second language teacher, SNF Pending bed offer               Expected Discharge Plan and Services In-house Referral: Clinical Social Work Discharge Planning Services: [employer] Consult Post Acute Care Choice: Skilled Nursing Facility Living arrangements for the past 2 months: Single Family Home                                       Social Drivers of Health (SDOH) Interventions SDOH Screenings   Food Insecurity: No Food Insecurity (01/28/2024)  Housing: Low Risk  (01/28/2024)  Transportation Needs: No Transportation Needs (01/28/2024)  Utilities: Not At Risk (01/28/2024)  Depression (PHQ2-9): Low Risk  (11/25/2023)  Financial Resource Strain: Low Risk  (08/20/2023)  Social Connections: Unknown (01/28/2024)  Stress: No Stress Concern Present (08/20/2023)  Tobacco Use: High Risk (01/28/2024)    Readmission Risk Interventions    11/05/2023   12:23 PM  Readmission Risk Prevention Plan  Transportation Screening Complete  PCP or Specialist Appt within 3-5 Days Complete  HRI or Home Care Consult Complete  Social Work Consult for Recovery Care Planning/Counseling Complete  Palliative Care Screening Complete  Medication Review Oceanographer) Complete

## 2024-03-05 LAB — RENAL FUNCTION PANEL
Albumin: 2.2 g/dL — ABNORMAL LOW (ref 3.5–5.0)
Anion gap: 10 (ref 5–15)
BUN: 38 mg/dL — ABNORMAL HIGH (ref 8–23)
CO2: 29 mmol/L (ref 22–32)
Calcium: 9.4 mg/dL (ref 8.9–10.3)
Chloride: 99 mmol/L (ref 98–111)
Creatinine, Ser: 0.63 mg/dL (ref 0.61–1.24)
GFR, Estimated: >60 mL/min (ref >60)
Glucose, Bld: 130 mg/dL — ABNORMAL HIGH (ref 70–99)
Phosphorus: 3.6 mg/dL (ref 2.5–4.6)
Potassium: 3.9 mmol/L (ref 3.5–5.1)
Sodium: 138 mmol/L (ref 135–145)

## 2024-03-05 LAB — GLUCOSE, CAPILLARY
Glucose-Capillary: 114 mg/dL — ABNORMAL HIGH (ref 70–99)
Glucose-Capillary: 165 mg/dL — ABNORMAL HIGH (ref 70–99)
Glucose-Capillary: 85 mg/dL (ref 70–99)
Glucose-Capillary: 88 mg/dL (ref 70–99)
Glucose-Capillary: 97 mg/dL (ref 70–99)

## 2024-03-05 LAB — MAGNESIUM: Magnesium: 2.2 mg/dL (ref 1.7–2.4)

## 2024-03-05 NOTE — Plan of Care (Signed)
  Problem: Coping: Goal: Ability to adjust to condition or change in health will improve Outcome: Progressing   Problem: Fluid Volume: Goal: Ability to maintain a balanced intake and output will improve Outcome: Progressing   Problem: Metabolic: Goal: Ability to maintain appropriate glucose levels will improve Outcome: Progressing   Problem: Nutritional: Goal: Maintenance of adequate nutrition will improve Outcome: Progressing   Problem: Skin Integrity: Goal: Risk for impaired skin integrity will decrease Outcome: Progressing   Problem: Tissue Perfusion: Goal: Adequacy of tissue perfusion will improve Outcome: Progressing   Problem: Clinical Measurements: Goal: Will remain free from infection Outcome: Progressing Goal: Respiratory complications will improve Outcome: Progressing   Problem: Activity: Goal: Risk for activity intolerance will decrease Outcome: Progressing   Problem: Pain Managment: Goal: General experience of comfort will improve and/or be controlled Outcome: Progressing   Problem: Safety: Goal: Ability to remain free from injury will improve Outcome: Progressing

## 2024-03-05 NOTE — Progress Notes (Signed)
 General Surgery Follow Up Note  Subjective:    Overnight Issues:   Objective:  Vital signs for last 24 hours: Temp:  [97.5 F (36.4 C)-98.6 F (37 C)] 97.8 F (36.6 C) (10/23 0401) Pulse Rate:  [82-94] 90 (10/23 0401) Resp:  [14-21] 21 (10/23 0401) BP: (108-138)/(64-89) 126/85 (10/23 0401) SpO2:  [93 %-98 %] 96 % (10/23 0401) Weight:  [72.5 kg] 72.5 kg (10/23 0500)  Hemodynamic parameters for last 24 hours:    Intake/Output from previous day: 10/22 0701 - 10/23 0700 In: 4700 [NG/GT:3890; IV Piggyback:810] Out: 1615 [Urine:1600; Drains:15]  Intake/Output this shift: No intake/output data recorded.  Vent settings for last 24 hours:    Physical Exam:  Gen: comfortable, no distress Neuro: follows commands, alert, communicative HEENT: PERRL Neck: supple CV: RRR Pulm: unlabored breathing on RA Abd: soft, NT   GU: urine clear and yellow, +spontaneous void Extr: wwp, no edema  Results for orders placed or performed during the hospital encounter of 01/15/24 (from the past 24 hours)  Glucose, capillary     Status: Abnormal   Collection Time: 03/04/24 12:11 PM  Result Value Ref Range   Glucose-Capillary 162 (H) 70 - 99 mg/dL  Glucose, capillary     Status: Abnormal   Collection Time: 03/04/24  5:55 PM  Result Value Ref Range   Glucose-Capillary 152 (H) 70 - 99 mg/dL  Glucose, capillary     Status: None   Collection Time: 03/05/24 12:02 AM  Result Value Ref Range   Glucose-Capillary 88 70 - 99 mg/dL  Renal function panel     Status: Abnormal   Collection Time: 03/05/24  5:26 AM  Result Value Ref Range   Sodium 138 135 - 145 mmol/L   Potassium 3.9 3.5 - 5.1 mmol/L   Chloride 99 98 - 111 mmol/L   CO2 29 22 - 32 mmol/L   Glucose, Bld 130 (H) 70 - 99 mg/dL   BUN 38 (H) 8 - 23 mg/dL   Creatinine, Ser 9.36 0.61 - 1.24 mg/dL   Calcium  9.4 8.9 - 10.3 mg/dL   Phosphorus 3.6 2.5 - 4.6 mg/dL   Albumin  2.2 (L) 3.5 - 5.0 g/dL   GFR, Estimated >39 >39 mL/min   Anion gap  10 5 - 15  Magnesium      Status: None   Collection Time: 03/05/24  5:26 AM  Result Value Ref Range   Magnesium  2.2 1.7 - 2.4 mg/dL  Glucose, capillary     Status: None   Collection Time: 03/05/24  5:30 AM  Result Value Ref Range   Glucose-Capillary 85 70 - 99 mg/dL    Assessment & Plan: The plan of care was discussed with the bedside nurse for the day, who is in agreement with this plan and no additional concerns were raised.   Present on Admission:  Adenocarcinoma of gastric cardia (HCC)    LOS: 50 days   Additional comments:I reviewed the patient's new clinical lab test results.   and I reviewed the patients new imaging test results.    [name] is a 68 yo male with gastric cardia adenocarcinoma, s/p total gastrectomy, with distal esophagectomy and roux-en-Y esophagojejunostomy on 9/3. S/p takeback by thoracic surgery for RATS repair of anastomotic leak on 9/4. S/p EGD with placement of esophageal stent on 9/11. S/p EGD with repositioning of esophageal stent 9/25.   - Has had respiratory arrest x2 associated with hypercarbia. Extubated 9/27, now on RA - Clonidine  patch discontinued, consider risperdal  ODT -  Pain control: scheduled tylenol , prn oxycodone . - Hypertension: home hydrochlorothiazide, metoprolol  62.5mg  BID     - EJ leak: S/p esophageal stent placement. Pleural JP to remain in place. On antibiotics and antifungal coverage per thoracic, will need 6 weeks total.  Thoracic planning for stent exchange at 6 weeks. Okay for discharge to [facility] prior to stent exchange if accepted.   - FEN: Continue J tube feeds at goal. Patient is tolerating and having bowel function. FWF interval now 30q6h given hyponatremia after initial correction of hypernatremia.   - ID: On broad-spectrum abx and antifungal coverage (Merrem  and Micafungin ) for treatment of aspiration pneumonia and EJ leak. No good enteral options per pharmacy in addition to concern for TF interfering with absorption. WBC  11.2 yest and afeb, recheck in AM - A-fib: remains in sinus rhythm. On therapeutic lovenox . - Code status: back to full code per patient wishes - Dispo: 4NP, labs OK today. PT recommending SNF at discharge, SW working on placement. Insurance denied LTACH, will plan for SNF given need for continued needs for IV antibiotics. Awaiting family choice of facility. 6w total course planned    [name], MD Trauma & General Surgery Please use [email or web address] to contact on call provider  03/05/2024  *Care during the described time interval was provided by me. I have reviewed this patient's available data, including medical history, events of note, physical examination and test results as part of my evaluation.

## 2024-03-05 NOTE — TOC Progression Note (Signed)
 Transition of Care [facility]) - Progression Note    Patient Details  Name: Oda Lansdowne MRN: [id number] Date of Birth: 12-29-1955  [facility]) CM/SW Contact  Montie LOISE Louder, [place] Phone Number: 03/05/2024, 5:32 PM  Clinical Narrative:     [facility] unable to offer,- informed patient's sister- she requested to follow up with [facility] before moving forward with [facility]. CSW informed will follow up with [facility] tomorrow.  Montie Louder, MSW, LCSW Clinical Social Worker     Expected Discharge Plan: Skilled Nursing Facility Barriers to Discharge: Continued Medical Work up, English as a second language teacher, SNF Pending bed offer               Expected Discharge Plan and Services In-house Referral: Clinical Social Work Discharge Planning Services: [employer] Consult Post Acute Care Choice: Skilled Nursing Facility Living arrangements for the past 2 months: Single Family Home                                       Social Drivers of Health (SDOH) Interventions SDOH Screenings   Food Insecurity: No Food Insecurity (01/28/2024)  Housing: Low Risk  (01/28/2024)  Transportation Needs: No Transportation Needs (01/28/2024)  Utilities: Not At Risk (01/28/2024)  Depression (PHQ2-9): Low Risk  (11/25/2023)  Financial Resource Strain: Low Risk  (08/20/2023)  Social Connections: Unknown (01/28/2024)  Stress: No Stress Concern Present (08/20/2023)  Tobacco Use: High Risk (01/28/2024)    Readmission Risk Interventions    11/05/2023   12:23 PM  Readmission Risk Prevention Plan  Transportation Screening Complete  PCP or Specialist Appt within 3-5 Days Complete  HRI or Home Care Consult Complete  Social Work Consult for Recovery Care Planning/Counseling Complete  Palliative Care Screening Complete  Medication Review Oceanographer) Complete

## 2024-03-05 NOTE — Plan of Care (Signed)
  Problem: Education: Goal: Individualized Educational Video(s) Outcome: Progressing   Problem: Fluid Volume: Goal: Ability to maintain a balanced intake and output will improve Outcome: Progressing   Problem: Health Behavior/Discharge Planning: Goal: Ability to identify and utilize available resources and services will improve Outcome: Progressing   Problem: Metabolic: Goal: Ability to maintain appropriate glucose levels will improve Outcome: Progressing   Problem: Nutritional: Goal: Maintenance of adequate nutrition will improve Outcome: Progressing Goal: Progress toward achieving an optimal weight will improve Outcome: Progressing   Problem: Skin Integrity: Goal: Risk for impaired skin integrity will decrease Outcome: Progressing   Problem: Tissue Perfusion: Goal: Adequacy of tissue perfusion will improve Outcome: Progressing   Problem: Education: Goal: Knowledge of General Education information will improve Description: Including pain rating scale, medication(s)/side effects and non-pharmacologic comfort measures Outcome: Progressing   Problem: Clinical Measurements: Goal: Will remain free from infection Outcome: Progressing Goal: Respiratory complications will improve Outcome: Progressing   Problem: Activity: Goal: Risk for activity intolerance will decrease Outcome: Progressing   Problem: Pain Managment: Goal: General experience of comfort will improve and/or be controlled Outcome: Progressing   Problem: Safety: Goal: Ability to remain free from injury will improve Outcome: Progressing

## 2024-03-05 NOTE — TOC Progression Note (Signed)
 Transition of Care [facility]) - Progression Note    Patient Details  Name: [name] MRN: [id number] Date of Birth: March 14, 1956  Transition of Care [facility]) CM/SW Contact  Montie LOISE Louder, [place] Phone Number: 03/05/2024, 4:15 PM  Clinical Narrative:     Received call from patient's sister- SNF choice is [facility] or [facility]  Contacted [facility] - waiting on response   Montie Louder, MSW, LCSW Clinical Social Worker    Expected Discharge Plan: Skilled Nursing Facility Barriers to Discharge: Continued Medical Work up, English as a second language teacher, SNF Pending bed offer               Expected Discharge Plan and Services In-house Referral: Clinical Social Work Discharge Planning Services: [employer] Consult Post Acute Care Choice: Skilled Nursing Facility Living arrangements for the past 2 months: Single Family Home                                       Social Drivers of Health (SDOH) Interventions SDOH Screenings   Food Insecurity: No Food Insecurity (01/28/2024)  Housing: Low Risk  (01/28/2024)  Transportation Needs: No Transportation Needs (01/28/2024)  Utilities: Not At Risk (01/28/2024)  Depression (PHQ2-9): Low Risk  (11/25/2023)  Financial Resource Strain: Low Risk  (08/20/2023)  Social Connections: Unknown (01/28/2024)  Stress: No Stress Concern Present (08/20/2023)  Tobacco Use: High Risk (01/28/2024)    Readmission Risk Interventions    11/05/2023   12:23 PM  Readmission Risk Prevention Plan  Transportation Screening Complete  PCP or Specialist Appt within 3-5 Days Complete  HRI or Home Care Consult Complete  Social Work Consult for Recovery Care Planning/Counseling Complete  Palliative Care Screening Complete  Medication Review Oceanographer) Complete

## 2024-03-06 LAB — RENAL FUNCTION PANEL
Albumin: 2.2 g/dL — ABNORMAL LOW (ref 3.5–5.0)
Anion gap: 10 (ref 5–15)
BUN: 38 mg/dL — ABNORMAL HIGH (ref 8–23)
CO2: 28 mmol/L (ref 22–32)
Calcium: 9.1 mg/dL (ref 8.9–10.3)
Chloride: 97 mmol/L — ABNORMAL LOW (ref 98–111)
Creatinine, Ser: 0.7 mg/dL (ref 0.61–1.24)
GFR, Estimated: >60 mL/min (ref >60)
Glucose, Bld: 152 mg/dL — ABNORMAL HIGH (ref 70–99)
Phosphorus: 3.4 mg/dL (ref 2.5–4.6)
Potassium: 4 mmol/L (ref 3.5–5.1)
Sodium: 135 mmol/L (ref 135–145)

## 2024-03-06 LAB — GLUCOSE, CAPILLARY
Glucose-Capillary: 123 mg/dL — ABNORMAL HIGH (ref 70–99)
Glucose-Capillary: 154 mg/dL — ABNORMAL HIGH (ref 70–99)
Glucose-Capillary: 94 mg/dL (ref 70–99)
Glucose-Capillary: 125 mg/dL — ABNORMAL HIGH (ref 70–99)

## 2024-03-06 LAB — MAGNESIUM: Magnesium: 1.9 mg/dL (ref 1.7–2.4)

## 2024-03-06 LAB — CBC
HCT: 29.9 % — ABNORMAL LOW (ref 39.0–52.0)
Hemoglobin: 9.8 g/dL — ABNORMAL LOW (ref 13.0–17.0)
MCH: 29.4 pg (ref 26.0–34.0)
MCHC: 32.8 g/dL (ref 30.0–36.0)
MCV: 89.8 fL (ref 80.0–100.0)
Platelets: 446 K/uL — ABNORMAL HIGH (ref 150–400)
RBC: 3.33 MIL/uL — ABNORMAL LOW (ref 4.22–5.81)
RDW: 16.7 % — ABNORMAL HIGH (ref 11.5–15.5)
WBC: 8.9 K/uL (ref 4.0–10.5)
nRBC: 0 % (ref 0.0–0.2)

## 2024-03-06 MED ORDER — LIDOCAINE 5 % EX PTCH
2.0000 | MEDICATED_PATCH | CUTANEOUS | Status: DC
  Administered 2024-03-06 – 2024-03-14 (×8): 2 via TRANSDERMAL
  Filled 2024-03-06 (×9): qty 2

## 2024-03-06 NOTE — TOC Progression Note (Signed)
 Transition of Care [facility]) - Progression Note    Patient Details  Name: [name] MRN: [id number] Date of Birth: Mar 09, 1956  Transition of Care [facility]) CM/SW Contact  Montie LOISE Louder, [place] Phone Number: 03/06/2024, 11:47 AM  Clinical Narrative:     Updated patient's sister [name] is unable to offer- advised CSW has reached out to her next choice, [facility] for bed confirmation.   [place]/ [facility] states they do not have a bed today - waiting on response when a bed may be available so that authorization can be started.   Montie Louder, MSW, LCSW Clinical Social Worker    Expected Discharge Plan: Skilled Nursing Facility Barriers to Discharge: Continued Medical Work up, English as a second language teacher, SNF Pending bed offer               Expected Discharge Plan and Services In-house Referral: Clinical Social Work Discharge Planning Services: [employer] Consult Post Acute Care Choice: Skilled Nursing Facility Living arrangements for the past 2 months: Single Family Home                                       Social Drivers of Health (SDOH) Interventions SDOH Screenings   Food Insecurity: No Food Insecurity (01/28/2024)  Housing: Low Risk  (01/28/2024)  Transportation Needs: No Transportation Needs (01/28/2024)  Utilities: Not At Risk (01/28/2024)  Depression (PHQ2-9): Low Risk  (11/25/2023)  Financial Resource Strain: Low Risk  (08/20/2023)  Social Connections: Unknown (01/28/2024)  Stress: No Stress Concern Present (08/20/2023)  Tobacco Use: High Risk (01/28/2024)    Readmission Risk Interventions    11/05/2023   12:23 PM  Readmission Risk Prevention Plan  Transportation Screening Complete  PCP or Specialist Appt within 3-5 Days Complete  HRI or Home Care Consult Complete  Social Work Consult for Recovery Care Planning/Counseling Complete  Palliative Care Screening Complete  Medication Review Oceanographer) Complete

## 2024-03-06 NOTE — Progress Notes (Signed)
 OT NOTE  Pt very fixated on shoes on arrival and OT was able to help calm pt with locating his tennis shoes in the patient closet. Pt more calm and relaxed after don doff of shoes. Pt very symptomatic of SBP decrease and limited to LB adl this session with return to bed. Pt will benefit from skilled OT to increase their independence and safety with adls and balance to allow discharge skilled inpatient follow up therapy, <3 hours/day. .   03/06/24 0900  OT Visit Information  Last OT Received On 03/06/24  Assistance Needed +1  History of Present Illness 68 y.o. male presents to [facility] hospital on 01/15/2024 with gastric CA. Pt underwent total gastrectomy with omentectomy and D2 lymphadenectomy, esophagojejunostomy with Jtube placement, splenic biopsy on 9/3. Pt developed bilious output into chest tube on 9/4, returned to OR for EGD and thoracoscopy with application of wound matrix to EJ anastomosis. ETT 9/11-9/12 for desats and unresponsiveness. S/p EGD and esophageal stent placement 9/11. Cardiac arrest 9/19. Re-ett 9/19-9/22. 9/26 PEA respiratory failure with intubation extubation 9/27.  Psychiatry evaluation to determine competency and per MD note, at time of evaluation, patient lacked medical decisional capacity regarding disposition... Recommend speaking with HCPOA regarding any dispositional planning and possibly consult palliative care to clarify GOC. PMH includes asthma, OA, COPD, GERD, DMII, MDD, emphysema.  Precautions  Precautions Fall  Recall of Precautions/Restrictions Impaired  Precaution/Restrictions Comments R JP drain x 1, J tube, R port, tube feeds paused during session; watch orthostatics abdomen binder  Restrictions  Weight Bearing Restrictions Per Provider Order No  Pain Assessment  Pain Assessment No/denies pain  Cognition  Arousal Alert  Behavior During Therapy WFL for tasks assessed/performed  Cognition Cognition impaired  Orientation impairments Time;Situation  Awareness  Intellectual awareness intact;Online awareness impaired  Memory impairment (select all impairments) Short-term memory;Working memory  OT - Cognition Comments pt reports being at hospital 2 days. pt fixated on not having 'boots . pt provided tennis shoes from closet and decreased restless behavior  Following Commands  Following commands Intact  Communication  Communication Impaired  Factors Affecting Communication Reduced clarity of speech  Upper Extremity Assessment  Upper Extremity Assessment Generalized weakness  Lower Extremity Assessment  Lower Extremity Assessment Defer to PT evaluation  Vision- Assessment  Vision Assessment? Wears glasses for reading  Perception  Perception Not tested  Praxis  Praxis Not tested  ADL  Overall ADL's  Needs assistance/impaired  Eating/Feeding NPO  Lower Body Dressing Contact guard assist;Sitting/lateral leans  Lower Body Dressing Details (indicate cue type and reason) don shoes and doff shoes sitting. pt with flexed posture to reach shoes and does not figure 4 cross. pt encouraged to bring foot up and pt states nah like this is fine  General ADL Comments pt with increased dizziness throughout session. pt coming to standing closing eyes and immediately returning to sitting on EOB. pt states i can't i am so dizzy. Pt noted to have SBP 125 to SBP 95 drop  Bed Mobility  Overal bed mobility Needs Assistance  Bed Mobility Supine to Sit;Sit to Supine  Rolling Contact guard assist  Supine to sit Contact guard  Sit to supine Contact guard assist  Transfers  Overall transfer level Needs assistance  Equipment used Rolling walker (2 wheels)  Transfers Sit to/from Stand  Sit to Stand Min assist  General transfer comment Pt immediately returning to sitting due to dizzines increased. pt noted to have SBP drop .  Balance  Overall balance assessment Needs  assistance  Sitting-balance support Bilateral upper extremity supported;Feet supported  Sitting  balance-Leahy Scale Fair  Standing balance support Bilateral upper extremity supported;During functional activity;Reliant on assistive device for balance  Standing balance-Leahy Scale Poor  General Comments  General comments (skin integrity, edema, etc.) SBP decreased and very symptomatic at present  OT - End of Session  Equipment Utilized During Treatment Rolling walker (2 wheels)  Activity Tolerance Treatment limited secondary to medical complications (Comment)  Patient left in bed;with call bell/phone within reach;with bed alarm set  Nurse Communication Mobility status;Precautions  OT Assessment/Plan  OT Visit Diagnosis Unsteadiness on feet (R26.81)  OT Frequency (ACUTE ONLY) Min 2X/week  Follow Up Recommendations Skilled nursing-short term rehab (<3 hours/day)  Patient can return home with the following A lot of help with walking and/or transfers;Two people to help with walking and/or transfers;A lot of help with bathing/dressing/bathroom;Two people to help with bathing/dressing/bathroom;Assistance with cooking/housework;Assist for transportation;Help with stairs or ramp for entrance  OT Equipment Other (comment);BSC/3in1 (RW)  AM-PAC OT 6 Clicks Daily Activity Outcome Measure (Version 2)  Help from another person eating meals? 3  Help from another person taking care of personal grooming? 3  Help from another person toileting, which includes using toliet, bedpan, or urinal? 2  Help from another person bathing (including washing, rinsing, drying)? 2  Help from another person to put on and taking off regular upper body clothing? 3  Help from another person to put on and taking off regular lower body clothing? 2  6 Click Score 15  Progressive Mobility  What is the highest level of mobility based on the mobility assessment? Level 3 (Stands with assistance) - Balance while standing  and cannot march in place  Mobility Referral Yes  Activity Stood at bedside  OT Goal Progression   Progress towards OT goals Not progressing toward goals - comment  Acute Rehab OT Goals  Patient Stated Goal to get my shoss  OT Goal Formulation With patient  Time For Goal Achievement 03/20/24  Potential to Achieve Goals Fair  ADL Goals  Pt Will Perform Grooming with contact guard assist;standing  Pt Will Perform Lower Body Dressing with min assist;sit to/from stand  Pt Will Transfer to Toilet with contact guard assist;ambulating  Additional ADL Goal #1 pt will follow 3 step commands min cues.  OT Time Calculation  OT Start Time (ACUTE ONLY) 0908  OT Stop Time (ACUTE ONLY) 0932  OT Time Calculation (min) 24 min  OT General Charges  $OT Visit 1 Visit  OT Treatments  $Self Care/Home Management  23-37 mins

## 2024-03-06 NOTE — Progress Notes (Signed)
 Physical Therapy Treatment Patient Details Name: [name] MRN: [id number] DOB: Jan 15, 1956 Today's Date: 03/06/2024   History of Present Illness 67 y.o. male presents to [facility] hospital on 01/15/2024 with gastric CA. Pt underwent total gastrectomy with omentectomy and D2 lymphadenectomy, esophagojejunostomy with Jtube placement, splenic biopsy on 9/3. Pt developed bilious output into chest tube on 9/4, returned to OR for EGD and thoracoscopy with application of wound matrix to EJ anastomosis. ETT 9/11-9/12 for desats and unresponsiveness. S/p EGD and esophageal stent placement 9/11. Cardiac arrest 9/19. Re-ett 9/19-9/22. 9/26 PEA respiratory failure with intubation extubation 9/27.  Psychiatry evaluation to determine competency and per MD note, at time of evaluation, patient lacked medical decisional capacity regarding disposition... Recommend speaking with HCPOA regarding any dispositional planning and possibly consult palliative care to clarify GOC. PMH includes asthma, OA, COPD, GERD, DMII, MDD, emphysema.    PT Comments  Pt tolerates treatment well, ambulating for increased distances and demonstrating improvement in endurance. Pt will benefit from further dynamic gait and balance challenges next session, with the possibility of weaning from RW when ambulating. Pt has no consistent caregiver support, living alone, and will need to be independent in all mobility and ADLs prior to returning home. Patient will benefit from continued inpatient follow up therapy, <3 hours/day.    If plan is discharge home, recommend the following: A little help with walking and/or transfers;A little help with bathing/dressing/bathroom;Assistance with cooking/housework;Assist for transportation;Help with stairs or ramp for entrance;Direct supervision/assist for medications management;Direct supervision/assist for financial management;Assistance with feeding   Can travel by private vehicle     No  Equipment  Recommendations  Rolling walker (2 wheels);BSC/3in1    Recommendations for Other Services       Precautions / Restrictions Precautions Precautions: Fall Recall of Precautions/Restrictions: Impaired Precaution/Restrictions Comments: R JP drain x 1, J tube, R port, tube feeds paused during session; watch orthostatics abdomen binder Restrictions Weight Bearing Restrictions Per Provider Order: No     Mobility  Bed Mobility Overal bed mobility: Needs Assistance Bed Mobility: Supine to Sit     Supine to sit: Contact guard          Transfers Overall transfer level: Needs assistance Equipment used: Rolling walker (2 wheels) Transfers: Sit to/from Stand Sit to Stand: Contact guard assist                Ambulation/Gait Ambulation/Gait assistance: Contact guard assist Gait Distance (Feet): 300 Feet Assistive device: Rolling walker (2 wheels) Gait Pattern/deviations: Step-through pattern Gait velocity: reduced Gait velocity interpretation: <1.8 ft/sec, indicate of risk for recurrent falls   General Gait Details: slowed step-through gait   Stairs             Wheelchair Mobility     Tilt Bed    Modified Rankin (Stroke Patients Only)       Balance Overall balance assessment: Needs assistance Sitting-balance support: No upper extremity supported, Feet supported Sitting balance-Leahy Scale: Good     Standing balance support: Single extremity supported, Reliant on assistive device for balance Standing balance-Leahy Scale: Poor                              Communication Communication Communication: Impaired Factors Affecting Communication: Reduced clarity of speech  Cognition Arousal: Alert Behavior During Therapy: WFL for tasks assessed/performed   PT - Cognitive impairments: Memory, Awareness  Following commands: Intact      Cueing Cueing Techniques: Verbal cues  Exercises      General  Comments General comments (skin integrity, edema, etc.): VSS on RA      Pertinent Vitals/Pain Pain Assessment Pain Assessment: No/denies pain    Home Living                          Prior Function            PT Goals (current goals can now be found in the care plan section) Acute Rehab PT Goals Patient Stated Goal: to return to independence Progress towards PT goals: Progressing toward goals    Frequency    Min 2X/week      PT Plan      Co-evaluation              AM-PAC PT 6 Clicks Mobility   Outcome Measure  Help needed turning from your back to your side while in a flat bed without using bedrails?: A Little Help needed moving from lying on your back to sitting on the side of a flat bed without using bedrails?: A Little Help needed moving to and from a bed to a chair (including a wheelchair)?: A Little Help needed standing up from a chair using your arms (e.g., wheelchair or bedside chair)?: A Little Help needed to walk in hospital room?: A Little Help needed climbing 3-5 steps with a railing? : A Lot 6 Click Score: 17    End of Session Equipment Utilized During Treatment: Gait belt Activity Tolerance: Patient tolerated treatment well Patient left: in chair;with call bell/phone within reach;with chair alarm set Nurse Communication: Mobility status PT Visit Diagnosis: Other abnormalities of gait and mobility (R26.89);Muscle weakness (generalized) (M62.81)     Time: 8859-8796 PT Time Calculation (min) (ACUTE ONLY): 23 min  Charges:    $Gait Training: 8-22 mins $Therapeutic Activity: 8-22 mins PT General Charges $$ ACUTE PT VISIT: 1 Visit                     Bernardino JINNY Ruth, PT, DPT Acute Rehabilitation Office [phone]    Bernardino JINNY Ruth 03/06/2024, 12:43 PM

## 2024-03-06 NOTE — Plan of Care (Signed)
  Problem: Coping: Goal: Ability to adjust to condition or change in health will improve Outcome: Progressing   Problem: Health Behavior/Discharge Planning: Goal: Ability to identify and utilize available resources and services will improve Outcome: Progressing   Problem: Nutritional: Goal: Maintenance of adequate nutrition will improve Outcome: Progressing   Problem: Skin Integrity: Goal: Risk for impaired skin integrity will decrease Outcome: Progressing   Problem: Education: Goal: Knowledge of General Education information will improve Description: Including pain rating scale, medication(s)/side effects and non-pharmacologic comfort measures Outcome: Progressing

## 2024-03-06 NOTE — Progress Notes (Signed)
 General Surgery Follow Up Note  Subjective:    Overnight Issues:  Complaint of low back pain   Objective:  Vital signs for last 24 hours: Temp:  [97.5 F (36.4 C)-98.4 F (36.9 C)] 98 F (36.7 C) (10/24 0740) Pulse Rate:  [88-101] 98 (10/24 0407) Resp:  [16-24] 22 (10/24 0740) BP: (124-137)/(78-93) 128/83 (10/24 0740) SpO2:  [96 %-100 %] 99 % (10/24 0407) Weight:  [73.7 kg] 73.7 kg (10/24 0500)  Hemodynamic parameters for last 24 hours:    Intake/Output from previous day: 10/23 0701 - 10/24 0700 In: 2214 [NG/GT:1719; IV Piggyback:405] Out: 610 [Urine:600; Drains:10]  Intake/Output this shift: No intake/output data recorded.  Vent settings for last 24 hours:    Physical Exam:  Gen: comfortable, no distress Neuro: follows commands, alert, communicative HEENT: PERRL Neck: supple CV: RRR Pulm: unlabored breathing on RA Abd: soft, NT   GU: urine clear and yellow, +spontaneous void Extr: wwp, no edema  Results for orders placed or performed during the hospital encounter of 01/15/24 (from the past 24 hours)  Glucose, capillary     Status: Abnormal   Collection Time: 03/05/24 12:13 PM  Result Value Ref Range   Glucose-Capillary 165 (H) 70 - 99 mg/dL  Glucose, capillary     Status: None   Collection Time: 03/05/24  5:57 PM  Result Value Ref Range   Glucose-Capillary 97 70 - 99 mg/dL  Glucose, capillary     Status: Abnormal   Collection Time: 03/05/24 11:41 PM  Result Value Ref Range   Glucose-Capillary 114 (H) 70 - 99 mg/dL  Renal function panel     Status: Abnormal   Collection Time: 03/06/24  5:26 AM  Result Value Ref Range   Sodium 135 135 - 145 mmol/L   Potassium 4.0 3.5 - 5.1 mmol/L   Chloride 97 (L) 98 - 111 mmol/L   CO2 28 22 - 32 mmol/L   Glucose, Bld 152 (H) 70 - 99 mg/dL   BUN 38 (H) 8 - 23 mg/dL   Creatinine, Ser 9.29 0.61 - 1.24 mg/dL   Calcium  9.1 8.9 - 10.3 mg/dL   Phosphorus 3.4 2.5 - 4.6 mg/dL   Albumin  2.2 (L) 3.5 - 5.0 g/dL   GFR,  Estimated >39 >39 mL/min   Anion gap 10 5 - 15  Magnesium      Status: None   Collection Time: 03/06/24  5:26 AM  Result Value Ref Range   Magnesium  1.9 1.7 - 2.4 mg/dL  CBC     Status: Abnormal   Collection Time: 03/06/24  5:26 AM  Result Value Ref Range   WBC 8.9 4.0 - 10.5 K/uL   RBC 3.33 (L) 4.22 - 5.81 MIL/uL   Hemoglobin 9.8 (L) 13.0 - 17.0 g/dL   HCT 70.0 (L) 60.9 - 47.9 %   MCV 89.8 80.0 - 100.0 fL   MCH 29.4 26.0 - 34.0 pg   MCHC 32.8 30.0 - 36.0 g/dL   RDW 83.2 (H) 88.4 - 84.4 %   Platelets 446 (H) 150 - 400 K/uL   nRBC 0.0 0.0 - 0.2 %  Glucose, capillary     Status: Abnormal   Collection Time: 03/06/24  6:39 AM  Result Value Ref Range   Glucose-Capillary 123 (H) 70 - 99 mg/dL    Assessment & Plan: The plan of care was discussed with the bedside nurse for the day, who is in agreement with this plan and no additional concerns were raised.   Present on Admission:  Adenocarcinoma of gastric cardia (HCC)    LOS: 51 days   Additional comments:I reviewed the patient's new clinical lab test results.   and I reviewed the patients new imaging test results.    Mr. Leppo is a 68 yo male with gastric cardia adenocarcinoma, s/p total gastrectomy, with distal esophagectomy and roux-en-Y esophagojejunostomy on 9/3. S/p takeback by thoracic surgery for RATS repair of anastomotic leak on 9/4. S/p EGD with placement of esophageal stent on 9/11. S/p EGD with repositioning of esophageal stent 9/25.   - Has had respiratory arrest x2 associated with hypercarbia. Extubated 9/27, now on RA - Clonidine  patch discontinued, consider risperdal  ODT - Pain control: scheduled tylenol , prn oxycodone . - Hypertension: home hydrochlorothiazide, metoprolol  62.5mg  BID     - EJ leak: S/p esophageal stent placement. Pleural JP to remain in place. On antibiotics and antifungal coverage per thoracic, will need 6 weeks total.  Thoracic planning for stent exchange at 6 weeks. Okay for discharge to [facility]  prior to stent exchange if accepted.   - FEN: Continue J tube feeds at goal. Patient is tolerating and having bowel function. FWF interval now 30q6h given hyponatremia after initial correction of hypernatremia.   - ID: On broad-spectrum abx and antifungal coverage (Merrem  and Micafungin ) for treatment of aspiration pneumonia and EJ leak. No good enteral options per pharmacy in addition to concern for TF interfering with absorption. WBC 8.9 from 11.2 yest and afeb. - A-fib: remains in sinus rhythm. On therapeutic lovenox . - Code status: back to full code per patient wishes - Dispo: 4NP, labs OK today. PT recommending SNF at discharge, SW working on placement. Insurance denied LTACH, will plan for SNF given need for continued needs for IV antibiotics. Awaiting family choice of facility. 6w total course planned    Burnard JONELLE Louder, [facility] Surgery 03/06/2024, 10:53 AM Please see Amion for pager number during day hours 7:00am-4:30pm   03/06/2024  *Care during the described time interval was provided by me. I have reviewed this patient's available data, including medical history, events of note, physical examination and test results as part of my evaluation.

## 2024-03-07 LAB — CBC
HCT: 30.1 % — ABNORMAL LOW (ref 39.0–52.0)
Hemoglobin: 9.6 g/dL — ABNORMAL LOW (ref 13.0–17.0)
MCH: 29.1 pg (ref 26.0–34.0)
MCHC: 31.9 g/dL (ref 30.0–36.0)
MCV: 91.2 fL (ref 80.0–100.0)
Platelets: 433 K/uL — ABNORMAL HIGH (ref 150–400)
RBC: 3.3 MIL/uL — ABNORMAL LOW (ref 4.22–5.81)
RDW: 16.8 % — ABNORMAL HIGH (ref 11.5–15.5)
WBC: 9 K/uL (ref 4.0–10.5)
nRBC: 0 % (ref 0.0–0.2)

## 2024-03-07 LAB — RENAL FUNCTION PANEL
Albumin: 2.1 g/dL — ABNORMAL LOW (ref 3.5–5.0)
Anion gap: 9 (ref 5–15)
BUN: 35 mg/dL — ABNORMAL HIGH (ref 8–23)
CO2: 28 mmol/L (ref 22–32)
Calcium: 8.8 mg/dL — ABNORMAL LOW (ref 8.9–10.3)
Chloride: 97 mmol/L — ABNORMAL LOW (ref 98–111)
Creatinine, Ser: 0.75 mg/dL (ref 0.61–1.24)
GFR, Estimated: >60 mL/min (ref >60)
Glucose, Bld: 138 mg/dL — ABNORMAL HIGH (ref 70–99)
Phosphorus: 3.8 mg/dL (ref 2.5–4.6)
Potassium: 3.6 mmol/L (ref 3.5–5.1)
Sodium: 134 mmol/L — ABNORMAL LOW (ref 135–145)

## 2024-03-07 LAB — GLUCOSE, CAPILLARY
Glucose-Capillary: 123 mg/dL — ABNORMAL HIGH (ref 70–99)
Glucose-Capillary: 126 mg/dL — ABNORMAL HIGH (ref 70–99)
Glucose-Capillary: 83 mg/dL (ref 70–99)

## 2024-03-07 MED ORDER — UMECLIDINIUM-VILANTEROL 62.5-25 MCG/ACT IN AEPB
1.0000 | INHALATION_SPRAY | Freq: Every day | RESPIRATORY_TRACT | Status: DC
  Administered 2024-03-08 – 2024-03-14 (×7): 1 via RESPIRATORY_TRACT
  Filled 2024-03-07: qty 14

## 2024-03-07 MED ORDER — UMECLIDINIUM-VILANTEROL 62.5-25 MCG/ACT IN AEPB
1.0000 | INHALATION_SPRAY | Freq: Every day | RESPIRATORY_TRACT | Status: DC
  Filled 2024-03-07: qty 14

## 2024-03-07 NOTE — Progress Notes (Signed)
 General Surgery Follow Up Note  Subjective:    Overnight Issues:  Complaint of low back pain   Objective:  Vital signs for last 24 hours: Temp:  [97.6 F (36.4 C)-98.3 F (36.8 C)] 97.7 F (36.5 C) (10/25 0724) Pulse Rate:  [65-98] 87 (10/25 1014) Resp:  [15-18] 15 (10/25 0812) BP: (108-147)/(65-96) 147/88 (10/25 1014) SpO2:  [95 %-99 %] 95 % (10/25 0812)  Hemodynamic parameters for last 24 hours:    Intake/Output from previous day: 10/24 0701 - 10/25 0700 In: 120 [NG/GT:120] Out: 550 [Urine:550]  Intake/Output this shift: No intake/output data recorded.  Vent settings for last 24 hours:    Physical Exam:  Gen: comfortable, no distress Neuro: follows commands, alert, communicative HEENT: PERRL Neck: supple CV: RRR Pulm: unlabored breathing on RA Abd: soft, NT   GU: urine clear and yellow, +spontaneous void Extr: wwp, no edema  Results for orders placed or performed during the hospital encounter of 01/15/24 (from the past 24 hours)  Glucose, capillary     Status: Abnormal   Collection Time: 03/06/24 12:26 PM  Result Value Ref Range   Glucose-Capillary 154 (H) 70 - 99 mg/dL  Glucose, capillary     Status: None   Collection Time: 03/06/24  6:01 PM  Result Value Ref Range   Glucose-Capillary 94 70 - 99 mg/dL  Glucose, capillary     Status: Abnormal   Collection Time: 03/06/24 11:27 PM  Result Value Ref Range   Glucose-Capillary 125 (H) 70 - 99 mg/dL  Renal function panel     Status: Abnormal   Collection Time: 03/07/24  5:00 AM  Result Value Ref Range   Sodium 134 (L) 135 - 145 mmol/L   Potassium 3.6 3.5 - 5.1 mmol/L   Chloride 97 (L) 98 - 111 mmol/L   CO2 28 22 - 32 mmol/L   Glucose, Bld 138 (H) 70 - 99 mg/dL   BUN 35 (H) 8 - 23 mg/dL   Creatinine, Ser 9.24 0.61 - 1.24 mg/dL   Calcium  8.8 (L) 8.9 - 10.3 mg/dL   Phosphorus 3.8 2.5 - 4.6 mg/dL   Albumin  2.1 (L) 3.5 - 5.0 g/dL   GFR, Estimated >39 >39 mL/min   Anion gap 9 5 - 15  CBC     Status:  Abnormal   Collection Time: 03/07/24  5:00 AM  Result Value Ref Range   WBC 9.0 4.0 - 10.5 K/uL   RBC 3.30 (L) 4.22 - 5.81 MIL/uL   Hemoglobin 9.6 (L) 13.0 - 17.0 g/dL   HCT 69.8 (L) 60.9 - 47.9 %   MCV 91.2 80.0 - 100.0 fL   MCH 29.1 26.0 - 34.0 pg   MCHC 31.9 30.0 - 36.0 g/dL   RDW 83.1 (H) 88.4 - 84.4 %   Platelets 433 (H) 150 - 400 K/uL   nRBC 0.0 0.0 - 0.2 %  Glucose, capillary     Status: Abnormal   Collection Time: 03/07/24  5:02 AM  Result Value Ref Range   Glucose-Capillary 123 (H) 70 - 99 mg/dL    Assessment & Plan: The plan of care was discussed with the bedside nurse for the day, who is in agreement with this plan and no additional concerns were raised.   Present on Admission:  Adenocarcinoma of gastric cardia (HCC)    LOS: 52 days   Additional comments:I reviewed the patient's new clinical lab test results.   and I reviewed the patients new imaging test results.  [name] is a 68 yo male with gastric cardia adenocarcinoma, s/p total gastrectomy, with distal esophagectomy and roux-en-Y esophagojejunostomy on 9/3. S/p takeback by thoracic surgery for RATS repair of anastomotic leak on 9/4. S/p EGD with placement of esophageal stent on 9/11. S/p EGD with repositioning of esophageal stent 9/25.   - Has had respiratory arrest x2 associated with hypercarbia. Extubated 9/27, now on RA - Clonidine  patch discontinued, consider risperdal  ODT - Pain control: scheduled tylenol , prn oxycodone . - Hypertension: home hydrochlorothiazide, metoprolol  62.5mg  BID     - EJ leak: S/p esophageal stent placement. Pleural JP to remain in place. On antibiotics and antifungal coverage per thoracic, will need 6 weeks total.  Thoracic planning for stent exchange at 6 weeks. Okay for discharge to [facility] prior to stent exchange if accepted.   - FEN: Continue J tube feeds at goal. Patient is tolerating and having bowel function. FWF interval now 30q6h given hyponatremia after initial  correction of hypernatremia.   - ID: On broad-spectrum abx and antifungal coverage (Merrem  and Micafungin ) for treatment of aspiration pneumonia and EJ leak. No good enteral options per pharmacy in addition to concern for TF interfering with absorption. WBC 8.9 from 11.2 yest and afeb. - A-fib: remains in sinus rhythm. On therapeutic lovenox . - Code status: back to full code per patient wishes - Dispo: 4NP, labs OK today. PT recommending SNF at discharge, SW working on placement. Insurance denied LTACH, will plan for SNF given need for continued needs for IV antibiotics. Awaiting family choice of facility. 6w total course planned   Jon JINNY Foy, MD  [facility] Surgery 03/07/2024, 11:23 AM Please see Amion for pager number during day hours 7:00am-4:30pm   03/07/2024  *Care during the described time interval was provided by me. I have reviewed this patient's available data, including medical history, events of note, physical examination and test results as part of my evaluation.

## 2024-03-07 NOTE — Plan of Care (Signed)
  Problem: Education: Goal: Ability to describe self-care measures that may prevent or decrease complications (Diabetes Survival Skills Education) will improve 03/07/2024 1733 by Emaline Dee, Alm FERNS, RN Outcome: Progressing 03/07/2024 1732 by Emaline Dee, Alm FERNS, RN Outcome: Progressing   Problem: Coping: Goal: Ability to adjust to condition or change in health will improve 03/07/2024 1733 by Emaline Dee, Alm FERNS, RN Outcome: Progressing 03/07/2024 1732 by Emaline Dee, Alm FERNS, RN Outcome: Progressing   Problem: Pain Managment: Goal: General experience of comfort will improve and/or be controlled 03/07/2024 1733 by Emaline Dee, Alm FERNS, RN Outcome: Progressing 03/07/2024 1732 by Emaline Dee, Alm FERNS, RN Outcome: Progressing   Problem: Safety: Goal: Ability to remain free from injury will improve 03/07/2024 1733 by Emaline Dee, Alm FERNS, RN Outcome: Progressing 03/07/2024 1732 by Emaline Dee, Alm FERNS, RN Outcome: Progressing

## 2024-03-08 LAB — RENAL FUNCTION PANEL
Albumin: 2.2 g/dL — ABNORMAL LOW (ref 3.5–5.0)
Anion gap: 10 (ref 5–15)
BUN: 37 mg/dL — ABNORMAL HIGH (ref 8–23)
CO2: 30 mmol/L (ref 22–32)
Calcium: 9.3 mg/dL (ref 8.9–10.3)
Chloride: 97 mmol/L — ABNORMAL LOW (ref 98–111)
Creatinine, Ser: 0.64 mg/dL (ref 0.61–1.24)
GFR, Estimated: >60 mL/min (ref >60)
Glucose, Bld: 127 mg/dL — ABNORMAL HIGH (ref 70–99)
Phosphorus: 3.9 mg/dL (ref 2.5–4.6)
Potassium: 4.2 mmol/L (ref 3.5–5.1)
Sodium: 137 mmol/L (ref 135–145)

## 2024-03-08 LAB — CBC
HCT: 29.4 % — ABNORMAL LOW (ref 39.0–52.0)
Hemoglobin: 9.5 g/dL — ABNORMAL LOW (ref 13.0–17.0)
MCH: 28.6 pg (ref 26.0–34.0)
MCHC: 32.3 g/dL (ref 30.0–36.0)
MCV: 88.6 fL (ref 80.0–100.0)
Platelets: 443 K/uL — ABNORMAL HIGH (ref 150–400)
RBC: 3.32 MIL/uL — ABNORMAL LOW (ref 4.22–5.81)
RDW: 16.7 % — ABNORMAL HIGH (ref 11.5–15.5)
WBC: 8.8 K/uL (ref 4.0–10.5)
nRBC: 0 % (ref 0.0–0.2)

## 2024-03-08 LAB — GLUCOSE, CAPILLARY
Glucose-Capillary: 104 mg/dL — ABNORMAL HIGH (ref 70–99)
Glucose-Capillary: 129 mg/dL — ABNORMAL HIGH (ref 70–99)
Glucose-Capillary: 141 mg/dL — ABNORMAL HIGH (ref 70–99)

## 2024-03-08 NOTE — Progress Notes (Signed)
 When doing shift assessment nurse found patients JP drain detached lying on bed.  General Surgery notified.

## 2024-03-08 NOTE — TOC Progression Note (Signed)
 Transition of Care [facility]) - Progression Note    Patient Details  Name: [name] MRN: [id number] Date of Birth: 11/03/1955  [facility] CM/SW Contact  Lauraine FORBES Saa, LCSWA Phone Number: 03/08/2024, 1:14 PM  Clinical Narrative:     1:14 PM CSW contacted [facility] admissions inquiring about bed availability for patient. CSW awaiting a response. CSW will continue to follow.  Expected Discharge Plan: Skilled Nursing Facility Barriers to Discharge: Continued Medical Work up, English As A Second Language Teacher, SNF Pending bed offer               Expected Discharge Plan and Services In-house Referral: Clinical Social Work Discharge Planning Services: [employer] Consult Post Acute Care Choice: Skilled Nursing Facility Living arrangements for the past 2 months: Single Family Home                                       Social Drivers of Health (SDOH) Interventions SDOH Screenings   Food Insecurity: No Food Insecurity (01/28/2024)  Housing: Low Risk  (01/28/2024)  Transportation Needs: No Transportation Needs (01/28/2024)  Utilities: Not At Risk (01/28/2024)  Depression (PHQ2-9): Low Risk  (11/25/2023)  Financial Resource Strain: Low Risk  (08/20/2023)  Social Connections: Unknown (01/28/2024)  Stress: No Stress Concern Present (08/20/2023)  Tobacco Use: High Risk (01/28/2024)    Readmission Risk Interventions    11/05/2023   12:23 PM  Readmission Risk Prevention Plan  Transportation Screening Complete  PCP or Specialist Appt within 3-5 Days Complete  HRI or Home Care Consult Complete  Social Work Consult for Recovery Care Planning/Counseling Complete  Palliative Care Screening Complete  Medication Review Oceanographer) Complete

## 2024-03-08 NOTE — Progress Notes (Signed)
 General Surgery Follow Up Note  Subjective:    Overnight Issues:  Complaint of low back pain   Objective:  Vital signs for last 24 hours: Temp:  [97.7 F (36.5 C)-98.6 F (37 C)] 98.5 F (36.9 C) (10/26 0722) Pulse Rate:  [81-95] 81 (10/26 0328) Resp:  [12-20] 20 (10/26 0722) BP: (107-158)/(85-97) 144/86 (10/26 0722) SpO2:  [94 %-99 %] 98 % (10/26 0722)  Hemodynamic parameters for last 24 hours:    Intake/Output from previous day: 10/25 0701 - 10/26 0700 In: 120 [NG/GT:120] Out: 5 [Drains:5]  Intake/Output this shift: No intake/output data recorded.  Vent settings for last 24 hours:    Physical Exam:  Gen: comfortable, no distress Neuro: follows commands, alert, communicative HEENT: PERRL Neck: supple CV: RRR Pulm: unlabored breathing on RA Abd: soft, NT   GU: urine clear and yellow, +spontaneous void Extr: wwp, no edema  Results for orders placed or performed during the hospital encounter of 01/15/24 (from the past 24 hours)  Glucose, capillary     Status: Abnormal   Collection Time: 03/07/24 12:00 PM  Result Value Ref Range   Glucose-Capillary 126 (H) 70 - 99 mg/dL  Glucose, capillary     Status: None   Collection Time: 03/07/24  6:08 PM  Result Value Ref Range   Glucose-Capillary 83 70 - 99 mg/dL  Glucose, capillary     Status: Abnormal   Collection Time: 03/08/24 12:03 AM  Result Value Ref Range   Glucose-Capillary 104 (H) 70 - 99 mg/dL  Renal function panel     Status: Abnormal   Collection Time: 03/08/24  4:00 AM  Result Value Ref Range   Sodium 137 135 - 145 mmol/L   Potassium 4.2 3.5 - 5.1 mmol/L   Chloride 97 (L) 98 - 111 mmol/L   CO2 30 22 - 32 mmol/L   Glucose, Bld 127 (H) 70 - 99 mg/dL   BUN 37 (H) 8 - 23 mg/dL   Creatinine, Ser 9.35 0.61 - 1.24 mg/dL   Calcium  9.3 8.9 - 10.3 mg/dL   Phosphorus 3.9 2.5 - 4.6 mg/dL   Albumin  2.2 (L) 3.5 - 5.0 g/dL   GFR, Estimated >39 >39 mL/min   Anion gap 10 5 - 15  CBC     Status: Abnormal    Collection Time: 03/08/24  4:00 AM  Result Value Ref Range   WBC 8.8 4.0 - 10.5 K/uL   RBC 3.32 (L) 4.22 - 5.81 MIL/uL   Hemoglobin 9.5 (L) 13.0 - 17.0 g/dL   HCT 70.5 (L) 60.9 - 47.9 %   MCV 88.6 80.0 - 100.0 fL   MCH 28.6 26.0 - 34.0 pg   MCHC 32.3 30.0 - 36.0 g/dL   RDW 83.2 (H) 88.4 - 84.4 %   Platelets 443 (H) 150 - 400 K/uL   nRBC 0.0 0.0 - 0.2 %  Glucose, capillary     Status: Abnormal   Collection Time: 03/08/24  5:02 AM  Result Value Ref Range   Glucose-Capillary 141 (H) 70 - 99 mg/dL    Assessment & Plan: The plan of care was discussed with the bedside nurse for the day, who is in agreement with this plan and no additional concerns were raised.   Present on Admission:  Adenocarcinoma of gastric cardia (HCC)    LOS: 53 days   Additional comments:I reviewed the patient's new clinical lab test results.   and I reviewed the patients new imaging test results.    [name]  is a 68 yo male with gastric cardia adenocarcinoma, s/p total gastrectomy, with distal esophagectomy and roux-en-Y esophagojejunostomy on 9/3. S/p takeback by thoracic surgery for RATS repair of anastomotic leak on 9/4. S/p EGD with placement of esophageal stent on 9/11. S/p EGD with repositioning of esophageal stent 9/25.   - Has had respiratory arrest x2 associated with hypercarbia. Extubated 9/27, now on RA - Clonidine  patch discontinued, consider risperdal  ODT - Pain control: scheduled tylenol , prn oxycodone . - Hypertension: home hydrochlorothiazide, metoprolol  62.5mg  BID     - EJ leak: S/p esophageal stent placement. Pleural JP to remain in place. On antibiotics and antifungal coverage per thoracic, will need 6 weeks total.  Thoracic planning for stent exchange at 6 weeks. Okay for discharge to [facility] prior to stent exchange if accepted.   - FEN: Continue J tube feeds at goal. Patient is tolerating and having bowel function. FWF interval now 30q6h given hyponatremia after initial correction of  hypernatremia.   - ID: On broad-spectrum abx and antifungal coverage (Merrem  and Micafungin ) for treatment of aspiration pneumonia and EJ leak. No good enteral options per pharmacy in addition to concern for TF interfering with absorption. WBC 8.9 from 11.2 yest and afeb. - A-fib: remains in sinus rhythm. On therapeutic lovenox . - Code status: back to full code per patient wishes - Dispo: 4NP, labs OK today. PT recommending SNF at discharge, SW working on placement. Insurance denied LTACH, will plan for SNF given need for continued needs for IV antibiotics. Awaiting family choice of facility. 6w total course planned   Deward JINNY Foy, MD  [facility] 03/08/2024, 9:34 AM Please see Amion for pager number during day hours 7:00am-4:30pm   03/08/2024  *Care during the described time interval was provided by me. I have reviewed this patient's available data, including medical history, events of note, physical examination and test results as part of my evaluation.

## 2024-03-09 LAB — RENAL FUNCTION PANEL
Albumin: 2.2 g/dL — ABNORMAL LOW (ref 3.5–5.0)
Anion gap: 11 (ref 5–15)
BUN: 30 mg/dL — ABNORMAL HIGH (ref 8–23)
CO2: 27 mmol/L (ref 22–32)
Calcium: 9.2 mg/dL (ref 8.9–10.3)
Chloride: 94 mmol/L — ABNORMAL LOW (ref 98–111)
Creatinine, Ser: 0.81 mg/dL (ref 0.61–1.24)
GFR, Estimated: >60 mL/min (ref >60)
Glucose, Bld: 125 mg/dL — ABNORMAL HIGH (ref 70–99)
Phosphorus: 3.3 mg/dL (ref 2.5–4.6)
Potassium: 4.1 mmol/L (ref 3.5–5.1)
Sodium: 132 mmol/L — ABNORMAL LOW (ref 135–145)

## 2024-03-09 LAB — CBC
HCT: 29.6 % — ABNORMAL LOW (ref 39.0–52.0)
Hemoglobin: 9.8 g/dL — ABNORMAL LOW (ref 13.0–17.0)
MCH: 29.5 pg (ref 26.0–34.0)
MCHC: 33.1 g/dL (ref 30.0–36.0)
MCV: 89.2 fL (ref 80.0–100.0)
Platelets: 437 K/uL — ABNORMAL HIGH (ref 150–400)
RBC: 3.32 MIL/uL — ABNORMAL LOW (ref 4.22–5.81)
RDW: 16.8 % — ABNORMAL HIGH (ref 11.5–15.5)
WBC: 8.8 K/uL (ref 4.0–10.5)
nRBC: 0 % (ref 0.0–0.2)

## 2024-03-09 MED ORDER — FREE WATER
100.0000 mL | Status: DC
  Administered 2024-03-09 – 2024-03-14 (×31): 100 mL

## 2024-03-09 NOTE — Progress Notes (Signed)
 Patient ID: Adair Lauderback, male   DOB: 03/02/1956, 68 y.o.   MRN: [id number] 32 Days Post-Op    Subjective: Chest JP came out overnight Doing fine ROS negative except as listed above. Objective: Vital signs in last 24 hours: Temp:  [97.6 F (36.4 C)-98.4 F (36.9 C)] 97.6 F (36.4 C) (10/27 0853) Pulse Rate:  [82-101] 101 (10/27 0832) Resp:  [15-20] 15 (10/27 0853) BP: (133-152)/(74-93) 151/93 (10/27 0853) SpO2:  [96 %-99 %] 98 % (10/27 0853) Last BM Date : 03/05/24  Intake/Output from previous day: 10/26 0701 - 10/27 0700 In: 120 [NG/GT:120] Out: 750 [Urine:750] Intake/Output this shift: No intake/output data recorded.  General appearance: alert and cooperative Resp: clear to auscultation bilaterally GI: soft, NT, J tube  Lab Results: CBC  Recent Labs    03/08/24 0400 03/09/24 0615  WBC 8.8 8.8  HGB 9.5* 9.8*  HCT 29.4* 29.6*  PLT 443* 437*   BMET Recent Labs    03/08/24 0400 03/09/24 0615  NA 137 132*  K 4.2 4.1  CL 97* 94*  CO2 30 27  GLUCOSE 127* 125*  BUN 37* 30*  CREATININE 0.64 0.81  CALCIUM  9.3 9.2   PT/INR No results for input(s): LABPROT, INR in the last 72 hours. ABG No results for input(s): PHART, HCO3 in the last 72 hours.  Invalid input(s): PCO2, PO2  Studies/Results: No results found.  Anti-infectives: Anti-infectives (From admission, onward)    Start     Dose/Rate Route Frequency Ordered Stop   02/07/24 2000  vancomycin  (VANCOCIN ) IVPB 1000 mg/200 mL premix  Status:  Discontinued        1,000 mg 200 mL/hr over 60 Minutes Intravenous Every 12 hours 02/07/24 1229 02/08/24 0725   02/07/24 0915  vancomycin  (VANCOREADY) IVPB 1500 mg/300 mL        1,500 mg 150 mL/hr over 120 Minutes Intravenous  Once 02/07/24 0823 02/07/24 1125   02/02/24 0400  micafungin  (MYCAMINE ) 100 mg in sodium chloride  0.9 % 100 mL IVPB        100 mg 105 mL/hr over 1 Hours Intravenous Daily 02/02/24 0248 03/15/24 1400   02/02/24 0400  meropenem   (MERREM ) 1,000 mg in sodium chloride  0.9 % 100 mL IVPB        1,000 mg 200 mL/hr over 30 Minutes Intravenous Every 8 hours 02/02/24 0248 03/15/24 1400   02/02/24 0315  vancomycin  (VANCOCIN ) IVPB 1000 mg/200 mL premix        1,000 mg 200 mL/hr over 60 Minutes Intravenous  Once 02/02/24 0228 02/02/24 0510   01/31/24 1800  piperacillin -tazobactam (ZOSYN ) IVPB 3.375 g  Status:  Discontinued        3.375 g 12.5 mL/hr over 240 Minutes Intravenous Every 8 hours 01/31/24 1639 02/02/24 0248   01/31/24 1745  fluconazole  (DIFLUCAN ) IVPB 400 mg  Status:  Discontinued        400 mg 100 mL/hr over 120 Minutes Intravenous Every 24 hours 01/31/24 1659 02/02/24 0248   01/22/24 1000  fluconazole  (DIFLUCAN ) IVPB 400 mg       Placed in Followed by Linked Group   400 mg 100 mL/hr over 120 Minutes Intravenous Every 24 hours 01/21/24 0956 01/30/24 1134   01/21/24 1045  fluconazole  (DIFLUCAN ) IVPB 800 mg       Placed in Followed by Linked Group   800 mg 200 mL/hr over 120 Minutes Intravenous  Once 01/21/24 0956 01/21/24 1441   01/21/24 0945  piperacillin -tazobactam (ZOSYN ) IVPB 3.375 g  3.375 g 12.5 mL/hr over 240 Minutes Intravenous Every 8 hours 01/21/24 0920 01/30/24 2216   01/16/24 1510  ceFAZolin  (ANCEF ) 2-4 GM/100ML-% IVPB       Note to Pharmacy: Cindie Pizza: cabinet override      01/16/24 1510 01/17/24 0314   01/15/24 0645  ceFAZolin  (ANCEF ) IVPB 2g/100 mL premix       Placed in And Linked Group   2 g 200 mL/hr over 30 Minutes Intravenous On call to O.R. 01/15/24 0641 01/15/24 1700   01/15/24 0645  metroNIDAZOLE  (FLAGYL ) IVPB 500 mg       Placed in And Linked Group   500 mg 100 mL/hr over 60 Minutes Intravenous On call to O.R. 01/15/24 9358 01/15/24 0910   01/15/24 9357  ceFAZolin  (ANCEF ) IVPB 2g/100 mL premix  Status:  Discontinued        2 g 200 mL/hr over 30 Minutes Intravenous 30 min pre-op 01/15/24 9357 01/15/24 0644       Assessment/Plan: Mr. Renne is a 68 yo male with  gastric cardia adenocarcinoma, s/p total gastrectomy, with distal esophagectomy and roux-en-Y esophagojejunostomy on 9/3. S/p takeback by thoracic surgery for RATS repair of anastomotic leak on 9/4. S/p EGD with placement of esophageal stent on 9/11. S/p EGD with repositioning of esophageal stent 9/25.   - Has had respiratory arrest x2 associated with hypercarbia. Extubated 9/27, now on RA - Clonidine  patch discontinued, consider risperdal  ODT - Pain control: scheduled tylenol , prn oxycodone . - Hypertension: home hydrochlorothiazide, metoprolol  62.5mg  BID     - EJ leak: S/p esophageal stent placement. Pleural JP to remain in place. On antibiotics and antifungal coverage per thoracic, will need 6 weeks total.  Thoracic planning for stent exchange at 6 weeks. Okay for discharge to [facility] prior to stent exchange if accepted. JP out overnight - I reached out to [facility] for plan.   - FEN: Continue J tube feeds at goal. Patient is tolerating and having bowel function. FWF interval now 30q6h given hyponatremia after initial correction of hypernatremia.   - ID: On broad-spectrum abx and antifungal coverage (Merrem  and Micafungin ) for treatment of aspiration pneumonia and EJ leak. No good enteral options per pharmacy in addition to concern for TF interfering with absorption.  - A-fib: remains in sinus rhythm. On therapeutic lovenox . - Code status: back to full code per patient wishes - Dispo: 4NP, labs OK today. PT recommending SNF at discharge, SW working on SNF placement. I reached out to [facility] regarding chest JP.  LOS: 54 days    Dann Hummer, MD, MPH, FACS Trauma & General Surgery Use [email or web address] to contact on call provider  03/09/2024

## 2024-03-09 NOTE — Plan of Care (Signed)

## 2024-03-09 NOTE — Progress Notes (Signed)
 Physical Therapy Treatment Patient Details Name: [name] MRN: [id number] DOB: 1955-05-17 Today's Date: 03/09/2024   History of Present Illness 68 y.o. male presents to [facility] hospital on 01/15/2024 with gastric CA. Pt underwent total gastrectomy with omentectomy and D2 lymphadenectomy, esophagojejunostomy with Jtube placement, splenic biopsy on 9/3. Pt developed bilious output into chest tube on 9/4, returned to OR for EGD and thoracoscopy with application of wound matrix to EJ anastomosis. ETT 9/11-9/12 for desats and unresponsiveness. S/p EGD and esophageal stent placement 9/11. Cardiac arrest 9/19. Re-ett 9/19-9/22. 9/26 PEA respiratory failure with intubation extubation 9/27.  Psychiatry evaluation to determine competency and per MD note, at time of evaluation, patient lacked medical decisional capacity regarding disposition... Recommend speaking with HCPOA regarding any dispositional planning and possibly consult palliative care to clarify GOC. PMH includes asthma, OA, COPD, GERD, DMII, MDD, emphysema.    PT Comments  Pt tolerates treatment well, ambulating for household distances with support of RW. Pt will benefit from further dynamic gait and balance training without DME in an effort to progress further to pt's baseline. Patient will benefit from continued inpatient follow up therapy, <3 hours/day.    If plan is discharge home, recommend the following: A little help with walking and/or transfers;A little help with bathing/dressing/bathroom;Assistance with cooking/housework;Assist for transportation;Help with stairs or ramp for entrance   Can travel by private vehicle     No  Equipment Recommendations  Rolling walker (2 wheels);BSC/3in1    Recommendations for Other Services       Precautions / Restrictions Precautions Precautions: Fall Recall of Precautions/Restrictions: Impaired Precaution/Restrictions Comments: J tube, R port, tube feeds paused during session; watch orthostatics  abdomen binder Restrictions Weight Bearing Restrictions Per Provider Order: No     Mobility  Bed Mobility                    Transfers Overall transfer level: Needs assistance Equipment used: Rolling walker (2 wheels) Transfers: Sit to/from Stand Sit to Stand: Contact guard assist                Ambulation/Gait Ambulation/Gait assistance: Contact guard assist Gait Distance (Feet): 250 Feet Assistive device: Rolling walker (2 wheels) Gait Pattern/deviations: Step-through pattern Gait velocity: reduced Gait velocity interpretation: <1.8 ft/sec, indicate of risk for recurrent falls   General Gait Details: slowed step-through gait, no considerable balance deviations noted   Stairs             Wheelchair Mobility     Tilt Bed    Modified Rankin (Stroke Patients Only)       Balance Overall balance assessment: Needs assistance Sitting-balance support: No upper extremity supported, Feet supported Sitting balance-Leahy Scale: Good     Standing balance support: Single extremity supported, Reliant on assistive device for balance Standing balance-Leahy Scale: Poor                              Communication Communication Communication: No apparent difficulties  Cognition Arousal: Alert Behavior During Therapy: WFL for tasks assessed/performed   PT - Cognitive impairments: Memory                         Following commands: Intact      Cueing Cueing Techniques: Verbal cues  Exercises      General Comments General comments (skin integrity, edema, etc.): VSS on RA      Pertinent Vitals/Pain Pain Assessment Pain  Assessment: Faces Faces Pain Scale: Hurts little more Pain Location: at J-tube site under bottom edge of bidner, PT adjusts positioning of J-tube with some improvement in comfort per pt Pain Descriptors / Indicators: Sore Pain Intervention(s): Monitored during session    Home Living                           Prior Function            PT Goals (current goals can now be found in the care plan section) Acute Rehab PT Goals Patient Stated Goal: to return to independence Progress towards PT goals: Progressing toward goals    Frequency    Min 2X/week      PT Plan      Co-evaluation              AM-PAC PT 6 Clicks Mobility   Outcome Measure  Help needed turning from your back to your side while in a flat bed without using bedrails?: A Little Help needed moving from lying on your back to sitting on the side of a flat bed without using bedrails?: A Little Help needed moving to and from a bed to a chair (including a wheelchair)?: A Little Help needed standing up from a chair using your arms (e.g., wheelchair or bedside chair)?: A Little Help needed to walk in hospital room?: A Little Help needed climbing 3-5 steps with a railing? : A Lot 6 Click Score: 17    End of Session Equipment Utilized During Treatment: Gait belt Activity Tolerance: Patient tolerated treatment well Patient left: in bed;with call bell/phone within reach Nurse Communication: Mobility status PT Visit Diagnosis: Other abnormalities of gait and mobility (R26.89);Muscle weakness (generalized) (M62.81)     Time: 8658-8645 PT Time Calculation (min) (ACUTE ONLY): 13 min  Charges:    $Gait Training: 8-22 mins PT General Charges $$ ACUTE PT VISIT: 1 Visit                     Bernardino JINNY Ruth, PT, DPT Acute Rehabilitation Office [phone]    Bernardino JINNY Ruth 03/09/2024, 2:32 PM

## 2024-03-09 NOTE — Progress Notes (Signed)
 Nutrition Follow-up  DOCUMENTATION CODES:   Severe malnutrition in context of chronic illness  INTERVENTION:  Continue tube feeding via J-tube: Continue Osmolite 1.5 @ 65 ml/h (1560 ml per day) Prosource TF20 60 ml BID 100 ml FWF Q4H    Provides 2500 kcal, 137 gm protein, 1185 ml free water  daily (1785 ml water  daily TF + FWF)   Monitor for diet advancement    NUTRITION DIAGNOSIS:   Severe Malnutrition related to cancer and cancer related treatments as evidenced by severe muscle depletion, moderate fat depletion, energy intake < or equal to 75% for > or equal to 1 month, percent weight loss (has lost 28 lbs, 15% in 6 months). - Ongoing   GOAL:   Patient will meet greater than or equal to 90% of their needs - Meeting via TF  MONITOR:   Diet advancement, TF tolerance, Labs, I & O's, Skin  REASON FOR ASSESSMENT:   Consult Assessment of nutrition requirement/status, Enteral/tube feeding initiation and management  ASSESSMENT:   [name] is a 68 yo male who was diagnosed with uT2N0 adenocarcinoma of the gastric cardia earlier this year. EUS showed evidence of invasion into the GE junction. The patient completed 3 cycles neoadjuvant FLOT with some complications, and was not able to tolerate further chemotherapy. Presented to [facility] cone for surgery now post op total gastrectomy with distal esophagectomy, roux-en-Y esophagojejunostomy, J-tube. PMH of T2DM, GERD, HTN, a-fib, hernia repair.   9/3 - total gastrectomy with distal esophagectomy, roux-en-Y esophagojejunostomy, J-tube, chest tube, NGT placed to lIS  9/4 - s/p Thoracoscopy repair of esophageal leak using Myriad Matrix  9/6 - transferred to ICU for hypoxia and increased o2 requirements 9/7 - Trickle tube feeds started, 20 ml/hr 9/8 - Tube feeds advanced to 30 ml/hr, transferred to progressive  9/9 - Tube feeds advanced to 40 ml/hr increase by 10 ml every 6 hours until goal of 60 ml/hr 9/10 - TF held 9/11 - Back to OR for  leak, esophageal stent placed, intubated 9/12 - Extubated  9/20 - cardiac arrest with suspected aspiration event 9/25 - arrest overnight, ROSC with Epi; intubated; TF held 9/27 - extubated; TF resumed at trickle 9/29 - TF to goal, TF increased to 65 ml/hr for wt loss 10/3 - Transferred to progressive  10/22 FMS removed    Pt up on edge of bed working with PT. Pt much more awake and alert, wondering when he can go home. Continues to tolerate J-tube feeds at goal. Had BM this morning. Has been able to walk around hallway with PT. Denies N/V/abdominal pain. Chest tube came out overnight.   Pt's weight appears stated on admission.Weight has fluctuated since admission due to fluid/drains. Difficult to access amount of weight loss but overall pt appeared to be losing weight, now weight stable. New weight of 73.7 kg today. Monitor weight trends, may need to increase tube feeding rate again if pt continues to lose weight again.  Some weight loss is to be expected for pt being bedbound.    Disposition: Plan SNF  Admit weight: 80.7 kg - Stated?  Current weight: 73.7 kg   Average Meal Intake: NPO   Intake/Output Summary (Last 24 hours) at 03/09/2024 1506 Last data filed at 03/09/2024 1200 Gross per 24 hour  Intake 120 ml  Output 750 ml  Net -630 ml   Nutritionally Relevant Medications: Scheduled Meds:  feeding supplement (PROSource TF20)  60 mL Per Tube BID   free water   30 mL Per Tube Q6H  Continuous Infusions:  feeding supplement (OSMOLITE 1.5 CAL) 1,000 mL (03/08/24 0910)   meropenem  (MERREM ) IV 1,000 mg (03/09/24 1429)   micafungin  (MYCAMINE ) 100 mg in sodium chloride  0.9 % 100 mL IVPB 100 mg (03/09/24 0851)   Labs Reviewed: Sodium 132 BUN 30 CBG ranges from 83-141 mg/dL over the last 24 hours HgbA1c 5.3  Diet Order:   Diet Order             Diet NPO time specified  Diet effective now                   EDUCATION NEEDS:   Education needs have been  addressed  Skin:  Skin Assessment: Skin Integrity Issues: Skin Integrity Issues:: Incisions, Other (Comment) Incisions: 9/3 abdomen, 9/4 rt chest Other: right back wound, left buttocks wound  Last BM:  10/27 - X 1 TYPE 7  Height:   Ht Readings from Last 1 Encounters:  01/16/24 5' 11 (1.803 m)    Weight:   Wt Readings from Last 1 Encounters:  03/06/24 73.7 kg    Ideal Body Weight:  78.2 kg  BMI:  Body mass index is 22.66 kg/m.  Estimated Nutritional Needs:   Kcal:  2300-2500 kcal  Protein:  120-140 gm  Fluid:  >/=2L/day   [name], RD Registered Dietitian  See Amion for more information

## 2024-03-10 LAB — RENAL FUNCTION PANEL
Albumin: 2.1 g/dL — ABNORMAL LOW (ref 3.5–5.0)
Anion gap: 9 (ref 5–15)
BUN: 33 mg/dL — ABNORMAL HIGH (ref 8–23)
CO2: 28 mmol/L (ref 22–32)
Calcium: 9.1 mg/dL (ref 8.9–10.3)
Chloride: 96 mmol/L — ABNORMAL LOW (ref 98–111)
Creatinine, Ser: 0.68 mg/dL (ref 0.61–1.24)
GFR, Estimated: >60 mL/min (ref >60)
Glucose, Bld: 123 mg/dL — ABNORMAL HIGH (ref 70–99)
Phosphorus: 3.7 mg/dL (ref 2.5–4.6)
Potassium: 4.1 mmol/L (ref 3.5–5.1)
Sodium: 133 mmol/L — ABNORMAL LOW (ref 135–145)

## 2024-03-10 LAB — CBC
HCT: 28.2 % — ABNORMAL LOW (ref 39.0–52.0)
Hemoglobin: 9.3 g/dL — ABNORMAL LOW (ref 13.0–17.0)
MCH: 29.1 pg (ref 26.0–34.0)
MCHC: 33 g/dL (ref 30.0–36.0)
MCV: 88.1 fL (ref 80.0–100.0)
Platelets: 413 K/uL — ABNORMAL HIGH (ref 150–400)
RBC: 3.2 MIL/uL — ABNORMAL LOW (ref 4.22–5.81)
RDW: 16.7 % — ABNORMAL HIGH (ref 11.5–15.5)
WBC: 7.8 K/uL (ref 4.0–10.5)
nRBC: 0 % (ref 0.0–0.2)

## 2024-03-10 MED FILL — Medication: Qty: 1 | Status: AC

## 2024-03-10 NOTE — Treatment Plan (Signed)
 Occupational Therapy Treatment Patient Details Name: [name] MRN: [id number] DOB: 01/28/1956 Today's Date: 03/10/2024   History of present illness 68 y.o. male presents to [facility] hospital on 01/15/2024 with gastric CA. Pt underwent total gastrectomy with omentectomy and D2 lymphadenectomy, esophagojejunostomy with Jtube placement, splenic biopsy on 9/3. Pt developed bilious output into chest tube on 9/4, returned to OR for EGD and thoracoscopy with application of wound matrix to EJ anastomosis. ETT 9/11-9/12 for desats and unresponsiveness. S/p EGD and esophageal stent placement 9/11. Cardiac arrest 9/19. Re-ett 9/19-9/22. 9/26 PEA respiratory failure with intubation extubation 9/27.  Psychiatry evaluation to determine competency and per MD note, at time of evaluation, patient lacked medical decisional capacity regarding disposition... Recommend speaking with HCPOA regarding any dispositional planning and possibly consult palliative care to clarify GOC. PMH includes asthma, OA, COPD, GERD, DMII, MDD, emphysema.   OT comments  Pt completed full bath at sink level CGA with BSC used for seated during the bathing. Pt able to sit and don doff paper pants with standing to pull up. Pt with cognitive deficits noted during session. Recommendation for skilled inpatient follow up therapy, <3 hours/day.       If plan is discharge home, recommend the following:  A lot of help with walking and/or transfers;Two people to help with walking and/or transfers;A lot of help with bathing/dressing/bathroom;Two people to help with bathing/dressing/bathroom;Assistance with cooking/housework;Assist for transportation;Help with stairs or ramp for entrance   Equipment Recommendations  Other (comment);BSC/3in1    Recommendations for Other Services      Precautions / Restrictions Precautions Precautions: Fall Recall of Precautions/Restrictions: Impaired Precaution/Restrictions Comments: tube feed with abdominal binder        Mobility Bed Mobility Overal bed mobility: Needs Assistance Bed Mobility: Rolling, Supine to Sit, Sit to Supine Rolling: Contact guard assist Sidelying to sit: Contact guard assist Supine to sit: Contact guard     General bed mobility comments: bed 30 degrees    Transfers Overall transfer level: Needs assistance   Transfers: Sit to/from Stand Sit to Stand: Contact guard assist           General transfer comment: initially sitting with dizziness and able to progress     Balance                                           ADL either performed or assessed with clinical judgement   ADL Overall ADL's : Needs assistance/impaired         Upper Body Bathing: Set up;Sitting   Lower Body Bathing: Contact guard assist;Sit to/from stand   Upper Body Dressing : Set up   Lower Body Dressing: Contact guard assist Lower Body Dressing Details (indicate cue type and reason): don doff paper pants Toilet Transfer: Contact guard assist;BSC/3in1           Functional mobility during ADLs: Contact guard assist;Rolling walker (2 wheels)      Extremity/Trunk Assessment Upper Extremity Assessment Upper Extremity Assessment: Generalized weakness   Lower Extremity Assessment Lower Extremity Assessment: Defer to PT evaluation        Vision   Vision Assessment?: Wears glasses for reading   Perception     Praxis     Communication Communication Communication: No apparent difficulties Factors Affecting Communication: Reduced clarity of speech   Cognition Arousal: Alert Behavior During Therapy: WFL for tasks assessed/performed Cognition: Cognition impaired  OT - Cognition Comments: pt asking if OT mother lived in [place] randomly without context or conversation prior. pt at the end of session asking OT if related to [facility] that live in his home town. All these quesitons were sponatenous.                 Following  commands: Intact        Cueing   Cueing Techniques: Verbal cues  Exercises      Shoulder Instructions       General Comments VSS on RA    Pertinent Vitals/ Pain       Pain Assessment Pain Assessment: No/denies pain  Home Living                                          Prior Functioning/Environment              Frequency  Min 2X/week        Progress Toward Goals  OT Goals(current goals can now be found in the care plan section)  Progress towards OT goals: Progressing toward goals  Acute Rehab OT Goals Patient Stated Goal: to get out of hospital OT Goal Formulation: With patient Time For Goal Achievement: 03/20/24 Potential to Achieve Goals: Fair ADL Goals Pt Will Perform Grooming: with contact guard assist;standing Pt Will Perform Lower Body Dressing: with min assist;sit to/from stand Pt Will Transfer to Toilet: with contact guard assist;ambulating Additional ADL Goal #1: pt will follow 3 step commands min cues.  Plan      Co-evaluation                 AM-PAC OT 6 Clicks Daily Activity     Outcome Measure   Help from another person eating meals?: A Little Help from another person taking care of personal grooming?: A Little Help from another person toileting, which includes using toliet, bedpan, or urinal?: A Little Help from another person bathing (including washing, rinsing, drying)?: A Little Help from another person to put on and taking off regular upper body clothing?: A Little Help from another person to put on and taking off regular lower body clothing?: A Little 6 Click Score: 18    End of Session Equipment Utilized During Treatment: Rolling walker (2 wheels)  OT Visit Diagnosis: Unsteadiness on feet (R26.81)   Activity Tolerance Treatment limited secondary to medical complications (Comment)   Patient Left in bed;with call bell/phone within reach;with bed alarm set   Nurse Communication Mobility  status;Precautions        Time: 8579-8554 OT Time Calculation (min): 25 min  Charges: OT General Charges $OT Visit: 1 Visit OT Treatments $Self Care/Home Management : 23-37 mins   Brynn, OTR/L  Acute Rehabilitation Services Office: [phone] .   Ely Molt 03/10/2024, 4:49 PM

## 2024-03-10 NOTE — Progress Notes (Addendum)
 Patient ID: [name], male   DOB: 04-30-1956, 68 y.o.   MRN: [id number] 33 Days Post-Op    Subjective: Reports no chest pain, sob or cough. Stable lower abdominal pain that is most in the RLQ. Tolerating TF's without n/v. BM documented yesterday. Voiding.   Afebrile. Soft BP improved. No tachycardia. He is on scheduled hydrochlorothiazide and BB. On RA. WBC wnl.   Objective: Vital signs in last 24 hours: Temp:  [97.6 F (36.4 C)-98.6 F (37 C)] 97.6 F (36.4 C) (10/28 0738) Pulse Rate:  [82-100] 90 (10/28 0826) Resp:  [17-22] 22 (10/28 0826) BP: (95-142)/(56-87) 118/66 (10/28 0738) SpO2:  [95 %-98 %] 98 % (10/28 0826) Weight:  [76.8 kg] 76.8 kg (10/28 0317) Last BM Date : 03/09/24  Intake/Output from previous day: 10/27 0701 - 10/28 0700 In: 3633.3 [NG/GT:2333.5; IV Piggyback:1299.8] Out: 400 [Urine:400] Intake/Output this shift: Total I/O In: 100 [NG/GT:100] Out: -   General appearance: alert and cooperative Resp: clear to auscultation bilaterally GI: soft, mild RLQ ttp, J tube in place with TF's running  Lab Results: CBC  Recent Labs    03/09/24 0615 03/10/24 0419  WBC 8.8 7.8  HGB 9.8* 9.3*  HCT 29.6* 28.2*  PLT 437* 413*   BMET Recent Labs    03/09/24 0615 03/10/24 0419  NA 132* 133*  K 4.1 4.1  CL 94* 96*  CO2 27 28  GLUCOSE 125* 123*  BUN 30* 33*  CREATININE 0.81 0.68  CALCIUM  9.2 9.1   PT/INR No results for input(s): LABPROT, INR in the last 72 hours. ABG No results for input(s): PHART, HCO3 in the last 72 hours.  Invalid input(s): PCO2, PO2  Studies/Results: No results found.  Anti-infectives: Anti-infectives (From admission, onward)    Start     Dose/Rate Route Frequency Ordered Stop   02/07/24 2000  vancomycin  (VANCOCIN ) IVPB 1000 mg/200 mL premix  Status:  Discontinued        1,000 mg 200 mL/hr over 60 Minutes Intravenous Every 12 hours 02/07/24 1229 02/08/24 0725   02/07/24 0915  vancomycin  (VANCOREADY) IVPB 1500  mg/300 mL        1,500 mg 150 mL/hr over 120 Minutes Intravenous  Once 02/07/24 0823 02/07/24 1125   02/02/24 0400  micafungin  (MYCAMINE ) 100 mg in sodium chloride  0.9 % 100 mL IVPB        100 mg 105 mL/hr over 1 Hours Intravenous Daily 02/02/24 0248 03/15/24 1400   02/02/24 0400  meropenem  (MERREM ) 1,000 mg in sodium chloride  0.9 % 100 mL IVPB        1,000 mg 200 mL/hr over 30 Minutes Intravenous Every 8 hours 02/02/24 0248 03/15/24 1400   02/02/24 0315  vancomycin  (VANCOCIN ) IVPB 1000 mg/200 mL premix        1,000 mg 200 mL/hr over 60 Minutes Intravenous  Once 02/02/24 0228 02/02/24 0510   01/31/24 1800  piperacillin -tazobactam (ZOSYN ) IVPB 3.375 g  Status:  Discontinued        3.375 g 12.5 mL/hr over 240 Minutes Intravenous Every 8 hours 01/31/24 1639 02/02/24 0248   01/31/24 1745  fluconazole  (DIFLUCAN ) IVPB 400 mg  Status:  Discontinued        400 mg 100 mL/hr over 120 Minutes Intravenous Every 24 hours 01/31/24 1659 02/02/24 0248   01/22/24 1000  fluconazole  (DIFLUCAN ) IVPB 400 mg       Placed in Followed by Linked Group   400 mg 100 mL/hr over 120 Minutes Intravenous Every 24 hours 01/21/24 0956  01/30/24 1134   01/21/24 1045  fluconazole  (DIFLUCAN ) IVPB 800 mg       Placed in Followed by Linked Group   800 mg 200 mL/hr over 120 Minutes Intravenous  Once 01/21/24 0956 01/21/24 1441   01/21/24 0945  piperacillin -tazobactam (ZOSYN ) IVPB 3.375 g        3.375 g 12.5 mL/hr over 240 Minutes Intravenous Every 8 hours 01/21/24 0920 01/30/24 2216   01/16/24 1510  ceFAZolin  (ANCEF ) 2-4 GM/100ML-% IVPB       Note to Pharmacy: Cindie Pizza: cabinet override      01/16/24 1510 01/17/24 0314   01/15/24 0645  ceFAZolin  (ANCEF ) IVPB 2g/100 mL premix       Placed in And Linked Group   2 g 200 mL/hr over 30 Minutes Intravenous On call to O.R. 01/15/24 0641 01/15/24 1700   01/15/24 0645  metroNIDAZOLE  (FLAGYL ) IVPB 500 mg       Placed in And Linked Group   500 mg 100 mL/hr over 60  Minutes Intravenous On call to O.R. 01/15/24 9358 01/15/24 0910   01/15/24 9357  ceFAZolin  (ANCEF ) IVPB 2g/100 mL premix  Status:  Discontinued        2 g 200 mL/hr over 30 Minutes Intravenous 30 min pre-op 01/15/24 9357 01/15/24 0644       Assessment/Plan: [name] is a 68 yo male with gastric cardia adenocarcinoma, s/p total gastrectomy, with distal esophagectomy and roux-en-Y esophagojejunostomy on 9/3. S/p takeback by thoracic surgery for RATS repair of anastomotic leak on 9/4. S/p EGD with placement of esophageal stent on 9/11. S/p EGD with repositioning of esophageal stent 9/25.   - Has had respiratory arrest x2 associated with hypercarbia. Extubated 9/27, now on RA - Clonidine  patch discontinued, consider risperdal  ODT - Pain control: scheduled tylenol , prn oxycodone . - Hypertension: home hydrochlorothiazide, metoprolol  62.5mg  BID. Monitor BP, if further soft BP can decrease doses of BP medications.      - EJ leak: S/p esophageal stent placement. Pleural JP to remain in place. On antibiotics and antifungal coverage per thoracic, will need 6 weeks total.  Thoracic planning for stent exchange at 6 weeks. Okay for discharge to [facility] prior to stent exchange if accepted. JP out overnight - await recommendations from TCTS for plan.   - FEN: Continue J tube feeds at goal. Patient is tolerating and having bowel function. FWF interval now 30q6h given hyponatremia after initial correction of hypernatremia.   - ID: On broad-spectrum abx and antifungal coverage (Merrem  and Micafungin ) for treatment of aspiration pneumonia and EJ leak. No good enteral options per pharmacy in addition to concern for TF interfering with absorption.  - A-fib: remains in sinus rhythm. On therapeutic lovenox . - Code status: back to full code per patient wishes - Dispo: 4NP, labs OK today. PT recommending SNF at discharge, SW working on SNF placement. Await recommendations from TCTS regarding chest JP.   LOS: 55  days    Dann Hummer, MD, MPH, FACS Trauma & General Surgery Use [email or web address] to contact on call provider  03/10/2024

## 2024-03-10 NOTE — Plan of Care (Signed)
  Problem: Education: Goal: Ability to describe self-care measures that may prevent or decrease complications (Diabetes Survival Skills Education) will improve Outcome: Progressing Goal: Individualized Educational Video(s) Outcome: Progressing   Problem: Coping: Goal: Ability to adjust to condition or change in health will improve Outcome: Progressing   Problem: Fluid Volume: Goal: Ability to maintain a balanced intake and output will improve Outcome: Progressing   Problem: Health Behavior/Discharge Planning: Goal: Ability to identify and utilize available resources and services will improve Outcome: Progressing Goal: Ability to manage health-related needs will improve Outcome: Progressing   Problem: Metabolic: Goal: Ability to maintain appropriate glucose levels will improve Outcome: Progressing   Problem: Nutritional: Goal: Maintenance of adequate nutrition will improve Outcome: Progressing Goal: Progress toward achieving an optimal weight will improve Outcome: Progressing   Problem: Skin Integrity: Goal: Risk for impaired skin integrity will decrease Outcome: Progressing   Problem: Tissue Perfusion: Goal: Adequacy of tissue perfusion will improve Outcome: Progressing   Problem: Education: Goal: Knowledge of General Education information will improve Description: Including pain rating scale, medication(s)/side effects and non-pharmacologic comfort measures Outcome: Progressing   Problem: Clinical Measurements: Goal: Will remain free from infection Outcome: Progressing Goal: Respiratory complications will improve Outcome: Progressing   Problem: Activity: Goal: Risk for activity intolerance will decrease Outcome: Progressing   Problem: Pain Managment: Goal: General experience of comfort will improve and/or be controlled Outcome: Progressing   Problem: Safety: Goal: Ability to remain free from injury will improve Outcome: Progressing

## 2024-03-11 LAB — RENAL FUNCTION PANEL
Albumin: 2.2 g/dL — ABNORMAL LOW (ref 3.5–5.0)
Anion gap: 9 (ref 5–15)
BUN: 30 mg/dL — ABNORMAL HIGH (ref 8–23)
CO2: 29 mmol/L (ref 22–32)
Calcium: 9 mg/dL (ref 8.9–10.3)
Chloride: 95 mmol/L — ABNORMAL LOW (ref 98–111)
Creatinine, Ser: 0.78 mg/dL (ref 0.61–1.24)
GFR, Estimated: >60 mL/min (ref >60)
Glucose, Bld: 121 mg/dL — ABNORMAL HIGH (ref 70–99)
Phosphorus: 3.5 mg/dL (ref 2.5–4.6)
Potassium: 4 mmol/L (ref 3.5–5.1)
Sodium: 133 mmol/L — ABNORMAL LOW (ref 135–145)

## 2024-03-11 NOTE — TOC Progression Note (Signed)
 Transition of Care [facility]) - Progression Note    Patient Details  Name: [name] MRN: [id number] Date of Birth: 08/18/1955  Transition of Care [facility]) CM/SW Contact  Montie LOISE Louder, [place] Phone Number: 03/11/2024, 1:43 PM  Clinical Narrative:     Currently [employer] is having system problems and is unable to start any authorizations at this time. TOC will continue to follow and will start auth once [facility] is operating.   Montie Louder, MSW, LCSW Clinical Social Worker    Expected Discharge Plan: Skilled Nursing Facility Barriers to Discharge: Continued Medical Work up, English As A Second Language Teacher, SNF Pending bed offer               Expected Discharge Plan and Services In-house Referral: Clinical Social Work Discharge Planning Services: [employer] Consult Post Acute Care Choice: Skilled Nursing Facility Living arrangements for the past 2 months: Single Family Home                                       Social Drivers of Health (SDOH) Interventions SDOH Screenings   Food Insecurity: No Food Insecurity (01/28/2024)  Housing: Low Risk  (01/28/2024)  Transportation Needs: No Transportation Needs (01/28/2024)  Utilities: Not At Risk (01/28/2024)  Depression (PHQ2-9): Low Risk  (11/25/2023)  Financial Resource Strain: Low Risk  (08/20/2023)  Social Connections: Unknown (01/28/2024)  Stress: No Stress Concern Present (08/20/2023)  Tobacco Use: High Risk (01/28/2024)    Readmission Risk Interventions    11/05/2023   12:23 PM  Readmission Risk Prevention Plan  Transportation Screening Complete  PCP or Specialist Appt within 3-5 Days Complete  HRI or Home Care Consult Complete  Social Work Consult for Recovery Care Planning/Counseling Complete  Palliative Care Screening Complete  Medication Review Oceanographer) Complete

## 2024-03-11 NOTE — Progress Notes (Signed)
 Patient ID: [name], male   DOB: Jan 20, 1956, 68 y.o.   MRN: [id number] 34 Days Post-Op    Subjective: Doing well ROS negative except as listed above. Objective: Vital signs in last 24 hours: Temp:  [97.5 F (36.4 C)-98.3 F (36.8 C)] 98.2 F (36.8 C) (10/29 0323) Pulse Rate:  [85-87] 87 (10/29 0323) Resp:  [16-20] 20 (10/29 0323) BP: (111-135)/(72-80) 130/79 (10/29 0323) SpO2:  [96 %-97 %] 96 % (10/29 0323) Weight:  [75 kg] 75 kg (10/29 0323) Last BM Date : 03/09/24  Intake/Output from previous day: 10/28 0701 - 10/29 0700 In: 2118.1 [NG/GT:1288.3; IV Piggyback:829.8] Out: 600 [Urine:600] Intake/Output this shift: Total I/O In: 100 [NG/GT:100] Out: -   General appearance: alert and cooperative Resp: clear to auscultation bilaterally GI: soft, NT  Lab Results: CBC  Recent Labs    03/09/24 0615 03/10/24 0419  WBC 8.8 7.8  HGB 9.8* 9.3*  HCT 29.6* 28.2*  PLT 437* 413*   BMET Recent Labs    03/10/24 0419 03/11/24 0520  NA 133* 133*  K 4.1 4.0  CL 96* 95*  CO2 28 29  GLUCOSE 123* 121*  BUN 33* 30*  CREATININE 0.68 0.78  CALCIUM  9.1 9.0   PT/INR No results for input(s): LABPROT, INR in the last 72 hours. ABG No results for input(s): PHART, HCO3 in the last 72 hours.  Invalid input(s): PCO2, PO2  Studies/Results: No results found.  Anti-infectives: Anti-infectives (From admission, onward)    Start     Dose/Rate Route Frequency Ordered Stop   02/07/24 2000  vancomycin  (VANCOCIN ) IVPB 1000 mg/200 mL premix  Status:  Discontinued        1,000 mg 200 mL/hr over 60 Minutes Intravenous Every 12 hours 02/07/24 1229 02/08/24 0725   02/07/24 0915  vancomycin  (VANCOREADY) IVPB 1500 mg/300 mL        1,500 mg 150 mL/hr over 120 Minutes Intravenous  Once 02/07/24 0823 02/07/24 1125   02/02/24 0400  micafungin  (MYCAMINE ) 100 mg in sodium chloride  0.9 % 100 mL IVPB        100 mg 105 mL/hr over 1 Hours Intravenous Daily 02/02/24 0248 03/15/24  1400   02/02/24 0400  meropenem  (MERREM ) 1,000 mg in sodium chloride  0.9 % 100 mL IVPB        1,000 mg 200 mL/hr over 30 Minutes Intravenous Every 8 hours 02/02/24 0248 03/15/24 1400   02/02/24 0315  vancomycin  (VANCOCIN ) IVPB 1000 mg/200 mL premix        1,000 mg 200 mL/hr over 60 Minutes Intravenous  Once 02/02/24 0228 02/02/24 0510   01/31/24 1800  piperacillin -tazobactam (ZOSYN ) IVPB 3.375 g  Status:  Discontinued        3.375 g 12.5 mL/hr over 240 Minutes Intravenous Every 8 hours 01/31/24 1639 02/02/24 0248   01/31/24 1745  fluconazole  (DIFLUCAN ) IVPB 400 mg  Status:  Discontinued        400 mg 100 mL/hr over 120 Minutes Intravenous Every 24 hours 01/31/24 1659 02/02/24 0248   01/22/24 1000  fluconazole  (DIFLUCAN ) IVPB 400 mg       Placed in Followed by Linked Group   400 mg 100 mL/hr over 120 Minutes Intravenous Every 24 hours 01/21/24 0956 01/30/24 1134   01/21/24 1045  fluconazole  (DIFLUCAN ) IVPB 800 mg       Placed in Followed by Linked Group   800 mg 200 mL/hr over 120 Minutes Intravenous  Once 01/21/24 0956 01/21/24 1441   01/21/24 0945  piperacillin -tazobactam (ZOSYN )  IVPB 3.375 g        3.375 g 12.5 mL/hr over 240 Minutes Intravenous Every 8 hours 01/21/24 0920 01/30/24 2216   01/16/24 1510  ceFAZolin  (ANCEF ) 2-4 GM/100ML-% IVPB       Note to Pharmacy: Cindie Pizza: cabinet override      01/16/24 1510 01/17/24 0314   01/15/24 0645  ceFAZolin  (ANCEF ) IVPB 2g/100 mL premix       Placed in And Linked Group   2 g 200 mL/hr over 30 Minutes Intravenous On call to O.R. 01/15/24 0641 01/15/24 1700   01/15/24 0645  metroNIDAZOLE  (FLAGYL ) IVPB 500 mg       Placed in And Linked Group   500 mg 100 mL/hr over 60 Minutes Intravenous On call to O.R. 01/15/24 9358 01/15/24 0910   01/15/24 9357  ceFAZolin  (ANCEF ) IVPB 2g/100 mL premix  Status:  Discontinued        2 g 200 mL/hr over 30 Minutes Intravenous 30 min pre-op 01/15/24 9357 01/15/24 0644        Assessment/Plan: Mr. Harty is a 68 yo male with gastric cardia adenocarcinoma, s/p total gastrectomy, with distal esophagectomy and roux-en-Y esophagojejunostomy on 9/3. S/p takeback by thoracic surgery for RATS repair of anastomotic leak on 9/4. S/p EGD with placement of esophageal stent on 9/11. S/p EGD with repositioning of esophageal stent 9/25.   - Has had respiratory arrest x2 associated with hypercarbia. Extubated 9/27, now on RA - Clonidine  patch discontinued, consider risperdal  ODT - Pain control: scheduled tylenol , prn oxycodone . - Hypertension: home hydrochlorothiazide, metoprolol  62.5mg  BID. Monitor BP, if further soft BP can decrease doses of BP medications.      - EJ leak: S/p esophageal stent placement. Pleural JP to remain in place. On antibiotics and antifungal coverage per thoracic, will need 6 weeks total.  Thoracic planning for stent exchange at 6 weeks. Okay for discharge to [facility] prior to stent exchange if accepted. JP out and TCTS observing without further intervention  - FEN: Continue J tube feeds at goal. Patient is tolerating and having bowel function. FWF interval now 30q6h given hyponatremia after initial correction of hypernatremia.   - ID: On broad-spectrum abx and antifungal coverage (Merrem  and Micafungin ) for treatment of aspiration pneumonia and EJ leak. No good enteral options per pharmacy in addition to concern for TF interfering with absorption.  - A-fib: remains in sinus rhythm. On therapeutic lovenox . - Code status: back to full code per patient wishes - Dispo: 4NP SW working on SNF placement   LOS: 56 days    Dann Hummer, MD, MPH, FACS Trauma & General Surgery Use [email or web address] to contact on call provider  03/11/2024

## 2024-03-11 NOTE — Plan of Care (Signed)
  Problem: Education: Goal: Ability to describe self-care measures that may prevent or decrease complications (Diabetes Survival Skills Education) will improve Outcome: Progressing Goal: Individualized Educational Video(s) Outcome: Progressing   Problem: Coping: Goal: Ability to adjust to condition or change in health will improve Outcome: Progressing   Problem: Fluid Volume: Goal: Ability to maintain a balanced intake and output will improve Outcome: Progressing   Problem: Health Behavior/Discharge Planning: Goal: Ability to identify and utilize available resources and services will improve Outcome: Progressing Goal: Ability to manage health-related needs will improve Outcome: Progressing   Problem: Metabolic: Goal: Ability to maintain appropriate glucose levels will improve Outcome: Progressing   Problem: Nutritional: Goal: Maintenance of adequate nutrition will improve Outcome: Progressing Goal: Progress toward achieving an optimal weight will improve Outcome: Progressing   Problem: Skin Integrity: Goal: Risk for impaired skin integrity will decrease Outcome: Progressing   Problem: Tissue Perfusion: Goal: Adequacy of tissue perfusion will improve Outcome: Progressing   Problem: Education: Goal: Knowledge of General Education information will improve Description: Including pain rating scale, medication(s)/side effects and non-pharmacologic comfort measures Outcome: Progressing   Problem: Clinical Measurements: Goal: Will remain free from infection Outcome: Progressing Goal: Respiratory complications will improve Outcome: Progressing   Problem: Activity: Goal: Risk for activity intolerance will decrease Outcome: Progressing   Problem: Pain Managment: Goal: General experience of comfort will improve and/or be controlled Outcome: Progressing   Problem: Safety: Goal: Ability to remain free from injury will improve Outcome: Progressing

## 2024-03-11 NOTE — TOC Progression Note (Signed)
 Transition of Care [facility]) - Progression Note    Patient Details  Name: [name] MRN: [id number] Date of Birth: 18-May-1955  Transition of Care [facility]) CM/SW Contact  Montie LOISE Louder, [place] Phone Number: 03/11/2024, 12:15 PM  Clinical Narrative:     CSW spoke with patient's sister, [name]- informed will start insurance authorization for rehab at [facility].   Montie Louder, MSW, LCSW Clinical Social Worker    Expected Discharge Plan: Skilled Nursing Facility Barriers to Discharge: Continued Medical Work up, English As A Second Language Teacher, SNF Pending bed offer               Expected Discharge Plan and Services In-house Referral: Clinical Social Work Discharge Planning Services: [employer] Consult Post Acute Care Choice: Skilled Nursing Facility Living arrangements for the past 2 months: Single Family Home                                       Social Drivers of Health (SDOH) Interventions SDOH Screenings   Food Insecurity: No Food Insecurity (01/28/2024)  Housing: Low Risk  (01/28/2024)  Transportation Needs: No Transportation Needs (01/28/2024)  Utilities: Not At Risk (01/28/2024)  Depression (PHQ2-9): Low Risk  (11/25/2023)  Financial Resource Strain: Low Risk  (08/20/2023)  Social Connections: Unknown (01/28/2024)  Stress: No Stress Concern Present (08/20/2023)  Tobacco Use: High Risk (01/28/2024)    Readmission Risk Interventions    11/05/2023   12:23 PM  Readmission Risk Prevention Plan  Transportation Screening Complete  PCP or Specialist Appt within 3-5 Days Complete  HRI or Home Care Consult Complete  Social Work Consult for Recovery Care Planning/Counseling Complete  Palliative Care Screening Complete  Medication Review Oceanographer) Complete

## 2024-03-12 LAB — RENAL FUNCTION PANEL
Albumin: 2.3 g/dL — ABNORMAL LOW (ref 3.5–5.0)
Anion gap: 15 (ref 5–15)
BUN: 30 mg/dL — ABNORMAL HIGH (ref 8–23)
CO2: 27 mmol/L (ref 22–32)
Calcium: 9.5 mg/dL (ref 8.9–10.3)
Chloride: 94 mmol/L — ABNORMAL LOW (ref 98–111)
Creatinine, Ser: 0.69 mg/dL (ref 0.61–1.24)
GFR, Estimated: >60 mL/min (ref >60)
Glucose, Bld: 104 mg/dL — ABNORMAL HIGH (ref 70–99)
Phosphorus: 3.5 mg/dL (ref 2.5–4.6)
Potassium: 4.1 mmol/L (ref 3.5–5.1)
Sodium: 136 mmol/L (ref 135–145)

## 2024-03-12 NOTE — Plan of Care (Signed)
  Problem: Education: Goal: Ability to describe self-care measures that may prevent or decrease complications (Diabetes Survival Skills Education) will improve Outcome: Progressing Goal: Individualized Educational Video(s) Outcome: Progressing   Problem: Coping: Goal: Ability to adjust to condition or change in health will improve Outcome: Progressing   Problem: Fluid Volume: Goal: Ability to maintain a balanced intake and output will improve Outcome: Progressing   Problem: Health Behavior/Discharge Planning: Goal: Ability to identify and utilize available resources and services will improve Outcome: Progressing Goal: Ability to manage health-related needs will improve Outcome: Progressing   Problem: Metabolic: Goal: Ability to maintain appropriate glucose levels will improve Outcome: Progressing   Problem: Nutritional: Goal: Maintenance of adequate nutrition will improve Outcome: Progressing Goal: Progress toward achieving an optimal weight will improve Outcome: Progressing   Problem: Skin Integrity: Goal: Risk for impaired skin integrity will decrease Outcome: Progressing   Problem: Tissue Perfusion: Goal: Adequacy of tissue perfusion will improve Outcome: Progressing   Problem: Education: Goal: Knowledge of General Education information will improve Description: Including pain rating scale, medication(s)/side effects and non-pharmacologic comfort measures Outcome: Progressing   Problem: Clinical Measurements: Goal: Will remain free from infection Outcome: Progressing Goal: Respiratory complications will improve Outcome: Progressing   Problem: Activity: Goal: Risk for activity intolerance will decrease Outcome: Progressing   Problem: Pain Managment: Goal: General experience of comfort will improve and/or be controlled Outcome: Progressing   Problem: Safety: Goal: Ability to remain free from injury will improve Outcome: Progressing

## 2024-03-12 NOTE — Progress Notes (Signed)
 Physical Therapy Treatment Patient Details Name: [name] MRN: [id number] DOB: 05/26/1955 Today's Date: 03/12/2024   History of Present Illness 68 y.o. male presents to [facility] hospital on 01/15/2024 with gastric CA. Pt underwent total gastrectomy with omentectomy and D2 lymphadenectomy, esophagojejunostomy with Jtube placement, splenic biopsy on 9/3. Pt developed bilious output into chest tube on 9/4, returned to OR for EGD and thoracoscopy with application of wound matrix to EJ anastomosis. ETT 9/11-9/12 for desats and unresponsiveness. S/p EGD and esophageal stent placement 9/11. Cardiac arrest 9/19. Re-ett 9/19-9/22. 9/26 PEA respiratory failure with intubation extubation 9/27.  Psychiatry evaluation to determine competency and per MD note, at time of evaluation, patient lacked medical decisional capacity regarding disposition... Recommend speaking with HCPOA regarding any dispositional planning and possibly consult palliative care to clarify GOC. PMH includes asthma, OA, COPD, GERD, DMII, MDD, emphysema.    PT Comments  Focused session on challenging and progressing pt's gait stability by having him perform various dynamic gait challenges while ambulating without UE support. He had > x3 lateral LOB bouts (to R>L), needing min-modA to recover. He particularly had difficulty with increasing his gait speed, changing head positions, reaching off COG and across midline, and ambulating with high knees for increased feet clearance. He remains at risk for falls and continues to display some cognitive deficits that impact his safety. Will continue to follow acutely. Of note, pt reported being uncomfortable to sit in the recliner but was agreeable to sitting in the bed with the bed in chair position end of session instead.   If plan is discharge home, recommend the following: A little help with walking and/or transfers;A little help with bathing/dressing/bathroom;Assistance with cooking/housework;Assist for  transportation;Help with stairs or ramp for entrance;Supervision due to cognitive status   Can travel by private vehicle     Yes  Equipment Recommendations  Rolling walker (2 wheels);BSC/3in1    Recommendations for Other Services       Precautions / Restrictions Precautions Precautions: Fall Recall of Precautions/Restrictions: Impaired Precaution/Restrictions Comments: J tube, R port, tube feeds paused during session; watch orthostatics abdomen binder Restrictions Weight Bearing Restrictions Per Provider Order: No     Mobility  Bed Mobility Overal bed mobility: Needs Assistance Bed Mobility: Supine to Sit, Sit to Supine     Supine to sit: Supervision, HOB elevated Sit to supine: Supervision, HOB elevated   General bed mobility comments: Supervision for safety and line management, HOB elevated    Transfers Overall transfer level: Needs assistance Equipment used: None Transfers: Sit to/from Stand Sit to Stand: Contact guard assist           General transfer comment: Extra effort to power up to stand from low bed height to no AD, mild sway, but no LOB, x6 reps, CGA for safety    Ambulation/Gait Ambulation/Gait assistance: Contact guard assist, Mod assist, Min assist Gait Distance (Feet): 180 Feet Assistive device: None Gait Pattern/deviations: Step-through pattern, Decreased stride length, Trunk flexed, Antalgic, Decreased weight shift to left, Decreased stance time - left, Decreased step length - right, Drifts right/left, Staggering right, Staggering left Gait velocity: reduced Gait velocity interpretation: <1.8 ft/sec, indicate of risk for recurrent falls   General Gait Details: Pt ambulated with a slow, antalgic gait pattern due to chronic L ankle issues per pt. Pt drifts laterally and swayed and had LOB laterally > 3x, more so to his R than his L, needing up to min-modA to recover. Cued pt to turn his head L <> R, nod his  head up <> down, turn around, change  speeds, reach off COG and across midline bil, and perform high knees while ambulating without UE support. Increased sway and instability noted with high knees. Poor ability to increase speeds. Slows his gait for head direction changes and to reach off COG   Stairs             Wheelchair Mobility     Tilt Bed    Modified Rankin (Stroke Patients Only)       Balance Overall balance assessment: Needs assistance Sitting-balance support: No upper extremity supported, Feet supported Sitting balance-Leahy Scale: Good     Standing balance support: During functional activity, No upper extremity supported Standing balance-Leahy Scale: Poor Standing balance comment: > x3 LOB laterally needing min-modA to recover when ambulating without UE support, slows his gait with head turns and displays increased sway with reaching off COG and high knees while ambulating                            Communication Communication Communication: No apparent difficulties  Cognition Arousal: Alert Behavior During Therapy: WFL for tasks assessed/performed   PT - Cognitive impairments: Memory                       PT - Cognition Comments: Pt unsure if year is 2025 or 2026 but knows it is October. Pt seeming to forget conversations held earlier in session. Following commands: Intact      Cueing Cueing Techniques: Verbal cues  Exercises Other Exercises Other Exercises: x5 serial sit <> stand, CGA    General Comments        Pertinent Vitals/Pain Pain Assessment Pain Assessment: Faces Faces Pain Scale: Hurts little more Pain Location: abdomen, L ankle (chronic per pt) Pain Descriptors / Indicators: Sore, Other (Comment) (nausea) Pain Intervention(s): Limited activity within patient's tolerance, Monitored during session, Repositioned, Patient requesting pain meds-RN notified    Home Living                          Prior Function            PT Goals (current  goals can now be found in the care plan section) Acute Rehab PT Goals Patient Stated Goal: to return to independence PT Goal Formulation: With patient Time For Goal Achievement: 03/16/24 Potential to Achieve Goals: Fair Progress towards PT goals: Progressing toward goals    Frequency    Min 2X/week      PT Plan      Co-evaluation              AM-PAC PT 6 Clicks Mobility   Outcome Measure  Help needed turning from your back to your side while in a flat bed without using bedrails?: A Little Help needed moving from lying on your back to sitting on the side of a flat bed without using bedrails?: A Little Help needed moving to and from a bed to a chair (including a wheelchair)?: A Little Help needed standing up from a chair using your arms (e.g., wheelchair or bedside chair)?: A Little Help needed to walk in hospital room?: A Little Help needed climbing 3-5 steps with a railing? : A Lot 6 Click Score: 17    End of Session Equipment Utilized During Treatment: Gait belt;Other (comment) (abdominal binder) Activity Tolerance: Patient tolerated treatment well Patient left: in bed;with call bell/phone within  reach;with bed alarm set (bed in chair position, pt reports recliner is uncomfortable and cannot tolerate sitting there)   PT Visit Diagnosis: Other abnormalities of gait and mobility (R26.89);Muscle weakness (generalized) (M62.81);Unsteadiness on feet (R26.81);Difficulty in walking, not elsewhere classified (R26.2)     Time: 8680-8661 PT Time Calculation (min) (ACUTE ONLY): 19 min  Charges:    $Gait Training: 8-22 mins PT General Charges $$ ACUTE PT VISIT: 1 Visit                     Theo Ferretti, PT, DPT Acute Rehabilitation Services  Office: [phone]    Theo CHRISTELLA Ferretti 03/12/2024, 1:52 PM

## 2024-03-12 NOTE — TOC Progression Note (Signed)
 Transition of Care [facility]) - Progression Note    Patient Details  Name: [name] MRN: [id number] Date of Birth: Jan 17, 1956  Transition of Care [facility]) CM/SW Contact  Montie LOISE Louder, [place] Phone Number: 03/12/2024, 1:47 PM  Clinical Narrative:     CSW started insurance authorization for [facility] reference # [id number]  Montie Louder, MSW, LCSW Clinical Social Worker    Expected Discharge Plan: Skilled Nursing Facility Barriers to Discharge: Continued Medical Work up, English As A Second Language Teacher, SNF Pending bed offer               Expected Discharge Plan and Services In-house Referral: Clinical Social Work Discharge Planning Services: [employer] Consult Post Acute Care Choice: Skilled Nursing Facility Living arrangements for the past 2 months: Single Family Home                                       Social Drivers of Health (SDOH) Interventions SDOH Screenings   Food Insecurity: No Food Insecurity (01/28/2024)  Housing: Low Risk  (01/28/2024)  Transportation Needs: No Transportation Needs (01/28/2024)  Utilities: Not At Risk (01/28/2024)  Depression (PHQ2-9): Low Risk  (11/25/2023)  Financial Resource Strain: Low Risk  (08/20/2023)  Social Connections: Unknown (01/28/2024)  Stress: No Stress Concern Present (08/20/2023)  Tobacco Use: High Risk (01/28/2024)    Readmission Risk Interventions    11/05/2023   12:23 PM  Readmission Risk Prevention Plan  Transportation Screening Complete  PCP or Specialist Appt within 3-5 Days Complete  HRI or Home Care Consult Complete  Social Work Consult for Recovery Care Planning/Counseling Complete  Palliative Care Screening Complete  Medication Review Oceanographer) Complete

## 2024-03-12 NOTE — Progress Notes (Signed)
 Patient ID: [name], male   DOB: [birth year], 68 y.o.   MRN: [id number] 35 Days Post-Op    Subjective: Doing well ROS negative except as listed above. Objective: Vital signs in last 24 hours: Temp:  [97.7 F (36.5 C)-98.3 F (36.8 C)] 97.7 F (36.5 C) (10/30 0754) Pulse Rate:  [85-103] 103 (10/30 0811) Resp:  [16-20] 18 (10/30 0811) BP: (103-130)/(62-95) 130/62 (10/30 0754) SpO2:  [94 %-97 %] 97 % (10/30 0258) Last BM Date : 03/09/24  Intake/Output from previous day: 10/29 0701 - 10/30 0700 In: 600 [NG/GT:600] Out: 750 [Urine:750] Intake/Output this shift: Total I/O In: 100 [NG/GT:100] Out: -   A&O Abd soft J tube Lungs CTA  Lab Results: CBC  Recent Labs    03/10/24 0419  WBC 7.8  HGB 9.3*  HCT 28.2*  PLT 413*   BMET Recent Labs    03/11/24 0520 03/12/24 0552  NA 133* 136  K 4.0 4.1  CL 95* 94*  CO2 29 27  GLUCOSE 121* 104*  BUN 30* 30*  CREATININE 0.78 0.69  CALCIUM  9.0 9.5   PT/INR No results for input(s): LABPROT, INR in the last 72 hours. ABG No results for input(s): PHART, HCO3 in the last 72 hours.  Invalid input(s): PCO2, PO2  Studies/Results: No results found.  Anti-infectives: Anti-infectives (From admission, onward)    Start     Dose/Rate Route Frequency Ordered Stop   02/07/24 2000  vancomycin  (VANCOCIN ) IVPB 1000 mg/200 mL premix  Status:  Discontinued        1,000 mg 200 mL/hr over 60 Minutes Intravenous Every 12 hours 02/07/24 1229 02/08/24 0725   02/07/24 0915  vancomycin  (VANCOREADY) IVPB 1500 mg/300 mL        1,500 mg 150 mL/hr over 120 Minutes Intravenous  Once 02/07/24 0823 02/07/24 1125   02/02/24 0400  micafungin  (MYCAMINE ) 100 mg in sodium chloride  0.9 % 100 mL IVPB        100 mg 105 mL/hr over 1 Hours Intravenous Daily 02/02/24 0248 03/15/24 1400   02/02/24 0400  meropenem  (MERREM ) 1,000 mg in sodium chloride  0.9 % 100 mL IVPB        1,000 mg 200 mL/hr over 30 Minutes Intravenous Every 8 hours  02/02/24 0248 03/15/24 1400   02/02/24 0315  vancomycin  (VANCOCIN ) IVPB 1000 mg/200 mL premix        1,000 mg 200 mL/hr over 60 Minutes Intravenous  Once 02/02/24 0228 02/02/24 0510   01/31/24 1800  piperacillin -tazobactam (ZOSYN ) IVPB 3.375 g  Status:  Discontinued        3.375 g 12.5 mL/hr over 240 Minutes Intravenous Every 8 hours 01/31/24 1639 02/02/24 0248   01/31/24 1745  fluconazole  (DIFLUCAN ) IVPB 400 mg  Status:  Discontinued        400 mg 100 mL/hr over 120 Minutes Intravenous Every 24 hours 01/31/24 1659 02/02/24 0248   01/22/24 1000  fluconazole  (DIFLUCAN ) IVPB 400 mg       Placed in Followed by Linked Group   400 mg 100 mL/hr over 120 Minutes Intravenous Every 24 hours 01/21/24 0956 01/30/24 1134   01/21/24 1045  fluconazole  (DIFLUCAN ) IVPB 800 mg       Placed in Followed by Linked Group   800 mg 200 mL/hr over 120 Minutes Intravenous  Once 01/21/24 0956 01/21/24 1441   01/21/24 0945  piperacillin -tazobactam (ZOSYN ) IVPB 3.375 g        3.375 g 12.5 mL/hr over 240 Minutes Intravenous Every 8 hours 01/21/24  0920 01/30/24 2216   01/16/24 1510  ceFAZolin  (ANCEF ) 2-4 GM/100ML-% IVPB       Note to Pharmacy: Cindie Pizza: cabinet override      01/16/24 1510 01/17/24 0314   01/15/24 0645  ceFAZolin  (ANCEF ) IVPB 2g/100 mL premix       Placed in And Linked Group   2 g 200 mL/hr over 30 Minutes Intravenous On call to O.R. 01/15/24 0641 01/15/24 1700   01/15/24 0645  metroNIDAZOLE  (FLAGYL ) IVPB 500 mg       Placed in And Linked Group   500 mg 100 mL/hr over 60 Minutes Intravenous On call to O.R. 01/15/24 9358 01/15/24 0910   01/15/24 9357  ceFAZolin  (ANCEF ) IVPB 2g/100 mL premix  Status:  Discontinued        2 g 200 mL/hr over 30 Minutes Intravenous 30 min pre-op 01/15/24 9357 01/15/24 0644       Assessment/Plan: [name] is a 68 yo male with gastric cardia adenocarcinoma, s/p total gastrectomy, with distal esophagectomy and roux-en-Y esophagojejunostomy on  9/3. S/p takeback by thoracic surgery for RATS repair of anastomotic leak on 9/4. S/p EGD with placement of esophageal stent on 9/11. S/p EGD with repositioning of esophageal stent 9/25.   - Has had respiratory arrest x2 associated with hypercarbia. Extubated 9/27, now on RA - Clonidine  patch discontinued, consider risperdal  ODT - Pain control: scheduled tylenol , prn oxycodone . - Hypertension: home hydrochlorothiazide, metoprolol  62.5mg  BID. Monitor BP, if further soft BP can decrease doses of BP medications.      - EJ leak: S/p esophageal stent placement. Pleural JP to remain in place. On antibiotics and antifungal coverage per thoracic, will need 6 weeks total.  Thoracic planning for stent exchange at 6 weeks. Okay for discharge to [facility] prior to stent exchange if accepted. JP out and TCTS observing without further intervention  - FEN: Continue J tube feeds at goal. Patient is tolerating and having bowel function. FWF interval now 30q6h given hyponatremia after initial correction of hypernatremia.   - ID: On broad-spectrum abx and antifungal coverage (Merrem  and Micafungin ) for treatment of aspiration pneumonia and EJ leak. No good enteral options per pharmacy in addition to concern for TF interfering with absorption.  - A-fib: remains in sinus rhythm. On therapeutic lovenox . - Code status: back to full code per patient wishes - Dispo: 4NP Doing well. SW working on SNF placement. There was an issue with [facility] computer system precluding approvals.  LOS: 57 days    Jon Hummer, MD, MPH, FACS Trauma & General Surgery Use [email or web address] to contact on call provider  03/12/2024

## 2024-03-13 ENCOUNTER — Other Ambulatory Visit (HOSPITAL_COMMUNITY): Payer: Self-pay

## 2024-03-13 LAB — HEPATIC FUNCTION PANEL
ALT: 26 U/L (ref 0–44)
AST: 24 U/L (ref 15–41)
Albumin: 2.3 g/dL — ABNORMAL LOW (ref 3.5–5.0)
Alkaline Phosphatase: 75 U/L (ref 38–126)
Bilirubin, Direct: <0.1 mg/dL (ref 0.0–0.2)
Total Bilirubin: 0.3 mg/dL (ref 0.0–1.2)
Total Protein: 6.3 g/dL — ABNORMAL LOW (ref 6.5–8.1)

## 2024-03-13 LAB — RENAL FUNCTION PANEL
Albumin: 2.2 g/dL — ABNORMAL LOW (ref 3.5–5.0)
Anion gap: 11 (ref 5–15)
BUN: 30 mg/dL — ABNORMAL HIGH (ref 8–23)
CO2: 29 mmol/L (ref 22–32)
Calcium: 9 mg/dL (ref 8.9–10.3)
Chloride: 93 mmol/L — ABNORMAL LOW (ref 98–111)
Creatinine, Ser: 0.75 mg/dL (ref 0.61–1.24)
GFR, Estimated: >60 mL/min (ref >60)
Glucose, Bld: 121 mg/dL — ABNORMAL HIGH (ref 70–99)
Phosphorus: 3.6 mg/dL (ref 2.5–4.6)
Potassium: 4 mmol/L (ref 3.5–5.1)
Sodium: 133 mmol/L — ABNORMAL LOW (ref 135–145)

## 2024-03-13 MED ORDER — MICAFUNGIN SODIUM 50 MG IV SOLR
100.0000 mg | INTRAVENOUS | 0 refills | Status: AC
  Filled 2024-03-13: qty 2, 20d supply, fill #0

## 2024-03-13 MED ORDER — PROSOURCE TF20 ENFIT COMPATIBL EN LIQD: 60.0000 mL | Freq: Two times a day (BID) | ENTERAL | Status: AC

## 2024-03-13 MED ORDER — SODIUM CHLORIDE 0.9% FLUSH: 10.0000 mL | Freq: Two times a day (BID) | INTRAVENOUS | Status: DC

## 2024-03-13 MED ORDER — LIDOCAINE 5 % EX PTCH: 2.0000 | MEDICATED_PATCH | CUTANEOUS | Status: DC

## 2024-03-13 MED ORDER — OSMOLITE 1.5 CAL PO LIQD: 1000.0000 mL | ORAL | Status: AC

## 2024-03-13 MED ORDER — METOPROLOL TARTRATE 25 MG/10 ML ORAL SUSPENSION: 62.5000 mg | Freq: Two times a day (BID) | ORAL | Status: DC

## 2024-03-13 MED ORDER — FREE WATER: 100.0000 mL | Status: DC

## 2024-03-13 MED ORDER — HYDROCHLOROTHIAZIDE 10 MG/ML ORAL SUSPENSION: 25.0000 mg | Freq: Every day | ORAL | Status: DC

## 2024-03-13 MED ORDER — ACETAMINOPHEN 160 MG/5ML PO SOLN: 650.0000 mg | Freq: Three times a day (TID) | ORAL | Status: AC

## 2024-03-13 MED ORDER — ONDANSETRON HCL 4 MG/2ML IJ SOLN: 4.0000 mg | Freq: Four times a day (QID) | INTRAMUSCULAR | Status: DC | PRN

## 2024-03-13 MED ORDER — RISPERIDONE 0.5 MG PO TBDP: 0.5000 mg | ORAL_TABLET | Freq: Every day | ORAL | Status: DC

## 2024-03-13 MED ORDER — UMECLIDINIUM-VILANTEROL 62.5-25 MCG/ACT IN AEPB: 1.0000 | INHALATION_SPRAY | Freq: Every day | RESPIRATORY_TRACT | Status: DC

## 2024-03-13 MED ORDER — ENOXAPARIN SODIUM 80 MG/0.8ML IJ SOSY: 70.0000 mg | PREFILLED_SYRINGE | Freq: Two times a day (BID) | INTRAMUSCULAR | Status: DC

## 2024-03-13 MED ORDER — OXYCODONE HCL 5 MG/5ML PO SOLN: 5.0000 mg | ORAL | 0 refills | Status: DC | PRN

## 2024-03-13 MED ORDER — MEROPENEM IV (FOR PTA / DISCHARGE USE ONLY): 1.0000 g | Freq: Three times a day (TID) | INTRAVENOUS | 0 refills | Status: AC

## 2024-03-13 MED ORDER — SODIUM CHLORIDE 0.9% FLUSH: 10.0000 mL | INTRAVENOUS | Status: DC | PRN

## 2024-03-13 NOTE — Progress Notes (Signed)
 Progress Note  36 Days Post-Op  Subjective: No significant new concerns. Having some pain in his back that he feels is more positional. Having bowel movements. Denies nausea, vomiting, abdominal pain.   ROS  All negative with the exception of above.  Objective: Vital signs in last 24 hours: Temp:  [97.5 F (36.4 C)-98.5 F (36.9 C)] 98.1 F (36.7 C) (10/31 0813) Pulse Rate:  [83-88] 83 (10/31 0813) Resp:  [15-21] 16 (10/31 0813) BP: (108-141)/(72-84) 135/84 (10/31 0813) SpO2:  [94 %-98 %] 95 % (10/31 0813) Weight:  [77.2 kg] 77.2 kg (10/31 0708) Last BM Date : 03/11/24  Intake/Output from previous day: 10/30 0701 - 10/31 0700 In: 600 [NG/GT:600] Out: 700 [Urine:700] Intake/Output this shift: Total I/O In: 5194.1 [I.V.:10; NG/GT:4274.1; IV Piggyback:910] Out: -   PE: General: Pleasant male who is laying in bed in NAD. HEENT: Head is normocephalic, atraumatic.  Heart: Normal HR. Palpable radial and pedal pulses bilaterally. Lungs: Respiratory effort nonlabored. Abd: Soft, NT. Gastrostomy/enterostomy jejunostomy in place. Skin: Warm and dry. Psych: A&Ox3 with an appropriate affect.    Lab Results:  No results for input(s): WBC, HGB, HCT, PLT in the last 72 hours. BMET Recent Labs    03/12/24 0552 03/13/24 0545  NA 136 133*  K 4.1 4.0  CL 94* 93*  CO2 27 29  GLUCOSE 104* 121*  BUN 30* 30*  CREATININE 0.69 0.75  CALCIUM  9.5 9.0   PT/INR No results for input(s): LABPROT, INR in the last 72 hours. CMP     Component Value Date/Time   NA 133 (L) 03/13/2024 0545   K 4.0 03/13/2024 0545   CL 93 (L) 03/13/2024 0545   CO2 29 03/13/2024 0545   GLUCOSE 121 (H) 03/13/2024 0545   BUN 30 (H) 03/13/2024 0545   CREATININE 0.75 03/13/2024 0545   CREATININE 0.85 10/10/2023 0824   CALCIUM  9.0 03/13/2024 0545   PROT 5.1 (L) 02/07/2024 0155   ALBUMIN  2.2 (L) 03/13/2024 0545   AST 31 02/07/2024 0155   AST 15 10/10/2023 0824   ALT 37 02/07/2024 0155    ALT 11 10/10/2023 0824   ALKPHOS 44 02/07/2024 0155   BILITOT 0.9 02/07/2024 0155   BILITOT 0.5 10/10/2023 0824   GFRNONAA >60 03/13/2024 0545   GFRNONAA >60 10/10/2023 0824   GFRAA  10/16/2007 1246    >60        The eGFR has been calculated using the MDRD equation. This calculation has not been validated in all clinical   Lipase  No results found for: LIPASE     Studies/Results: No results found.  Anti-infectives: Anti-infectives (From admission, onward)    Start     Dose/Rate Route Frequency Ordered Stop   02/07/24 2000  vancomycin  (VANCOCIN ) IVPB 1000 mg/200 mL premix  Status:  Discontinued        1,000 mg 200 mL/hr over 60 Minutes Intravenous Every 12 hours 02/07/24 1229 02/08/24 0725   02/07/24 0915  vancomycin  (VANCOREADY) IVPB 1500 mg/300 mL        1,500 mg 150 mL/hr over 120 Minutes Intravenous  Once 02/07/24 0823 02/07/24 1125   02/02/24 0400  micafungin  (MYCAMINE ) 100 mg in sodium chloride  0.9 % 100 mL IVPB        100 mg 105 mL/hr over 1 Hours Intravenous Daily 02/02/24 0248 03/15/24 1400   02/02/24 0400  meropenem  (MERREM ) 1,000 mg in sodium chloride  0.9 % 100 mL IVPB        1,000 mg 200  mL/hr over 30 Minutes Intravenous Every 8 hours 02/02/24 0248 03/15/24 1400   02/02/24 0315  vancomycin  (VANCOCIN ) IVPB 1000 mg/200 mL premix        1,000 mg 200 mL/hr over 60 Minutes Intravenous  Once 02/02/24 0228 02/02/24 0510   01/31/24 1800  piperacillin -tazobactam (ZOSYN ) IVPB 3.375 g  Status:  Discontinued        3.375 g 12.5 mL/hr over 240 Minutes Intravenous Every 8 hours 01/31/24 1639 02/02/24 0248   01/31/24 1745  fluconazole  (DIFLUCAN ) IVPB 400 mg  Status:  Discontinued        400 mg 100 mL/hr over 120 Minutes Intravenous Every 24 hours 01/31/24 1659 02/02/24 0248   01/22/24 1000  fluconazole  (DIFLUCAN ) IVPB 400 mg       Placed in Followed by Linked Group   400 mg 100 mL/hr over 120 Minutes Intravenous Every 24 hours 01/21/24 0956 01/30/24 1134   01/21/24  1045  fluconazole  (DIFLUCAN ) IVPB 800 mg       Placed in Followed by Linked Group   800 mg 200 mL/hr over 120 Minutes Intravenous  Once 01/21/24 0956 01/21/24 1441   01/21/24 0945  piperacillin -tazobactam (ZOSYN ) IVPB 3.375 g        3.375 g 12.5 mL/hr over 240 Minutes Intravenous Every 8 hours 01/21/24 0920 01/30/24 2216   01/16/24 1510  ceFAZolin  (ANCEF ) 2-4 GM/100ML-% IVPB       Note to Pharmacy: Cindie Pizza: cabinet override      01/16/24 1510 01/17/24 0314   01/15/24 0645  ceFAZolin  (ANCEF ) IVPB 2g/100 mL premix       Placed in And Linked Group   2 g 200 mL/hr over 30 Minutes Intravenous On call to O.R. 01/15/24 0641 01/15/24 1700   01/15/24 0645  metroNIDAZOLE  (FLAGYL ) IVPB 500 mg       Placed in And Linked Group   500 mg 100 mL/hr over 60 Minutes Intravenous On call to O.R. 01/15/24 9358 01/15/24 0910   01/15/24 0642  ceFAZolin  (ANCEF ) IVPB 2g/100 mL premix  Status:  Discontinued        2 g 200 mL/hr over 30 Minutes Intravenous 30 min pre-op 01/15/24 9357 01/15/24 0644        Assessment/Plan [name] is a 68 yo male with gastric cardia adenocarcinoma, s/p total gastrectomy, with distal esophagectomy and roux-en-Y esophagojejunostomy on 9/3. S/p takeback by thoracic surgery for RATS repair of anastomotic leak on 9/4. S/p EGD with placement of esophageal stent on 9/11. S/p EGD with repositioning of esophageal stent 9/25.   - Has had respiratory arrest x2 associated with hypercarbia. Extubated 9/27, now on RA - Clonidine  patch discontinued, consider risperdal  ODT - Pain control: scheduled tylenol , prn oxycodone . - Hypertension: home hydrochlorothiazide, metoprolol  62.5mg  BID. Monitor BP, if further soft BP can decrease doses of BP medications.      - EJ leak: S/p esophageal stent placement. Pleural JP to remain in place. On antibiotics and antifungal coverage per thoracic, will need 6 weeks total.  Thoracic planning for stent exchange at 6 weeks. Okay for discharge  to [facility] prior to stent exchange if accepted. JP out and TCTS observing without further intervention   - FEN: Continue J tube feeds at goal. Patient is tolerating and having bowel function. FWF interval now 30q6h given hyponatremia after initial correction of hypernatremia.   - ID: On broad-spectrum abx and antifungal coverage (Merrem  and Micafungin ) for treatment of aspiration pneumonia and EJ leak. No good enteral options per pharmacy in addition  to concern for TF interfering with absorption.  - A-fib: remains in sinus rhythm. On therapeutic lovenox . - Code status: back to full code per patient wishes - Dispo: 4NP SW working on SNF placement    LOS: 58 days   I reviewed specialist notes, consulting provider notes, nursing notes, last 24 h vitals and pain scores, last 48 h intake and output, last 24 h labs and trends, and last 24 h imaging results.  This care required moderate level of medical decision making.    Marjorie Carlyon Favre, [facility] Surgery 03/13/2024, 10:18 AM Please see Amion for pager number during day hours 7:00am-4:30pm

## 2024-03-13 NOTE — Plan of Care (Signed)
  Problem: Education: Goal: Ability to describe self-care measures that may prevent or decrease complications (Diabetes Survival Skills Education) will improve Outcome: Completed/Met Goal: Individualized Educational Video(s) Outcome: Completed/Met   Problem: Coping: Goal: Ability to adjust to condition or change in health will improve Outcome: Completed/Met   Problem: Fluid Volume: Goal: Ability to maintain a balanced intake and output will improve Outcome: Completed/Met   Problem: Health Behavior/Discharge Planning: Goal: Ability to identify and utilize available resources and services will improve Outcome: Completed/Met Goal: Ability to manage health-related needs will improve Outcome: Completed/Met   Problem: Metabolic: Goal: Ability to maintain appropriate glucose levels will improve Outcome: Completed/Met   Problem: Nutritional: Goal: Maintenance of adequate nutrition will improve Outcome: Completed/Met Goal: Progress toward achieving an optimal weight will improve Outcome: Completed/Met   Problem: Skin Integrity: Goal: Risk for impaired skin integrity will decrease Outcome: Completed/Met   Problem: Tissue Perfusion: Goal: Adequacy of tissue perfusion will improve Outcome: Completed/Met   Problem: Education: Goal: Knowledge of General Education information will improve Description: Including pain rating scale, medication(s)/side effects and non-pharmacologic comfort measures Outcome: Completed/Met   Problem: Clinical Measurements: Goal: Will remain free from infection Outcome: Completed/Met Goal: Respiratory complications will improve Outcome: Completed/Met   Problem: Activity: Goal: Risk for activity intolerance will decrease Outcome: Completed/Met   Problem: Pain Managment: Goal: General experience of comfort will improve and/or be controlled Outcome: Completed/Met   Problem: Safety: Goal: Ability to remain free from injury will improve Outcome:  Completed/Met

## 2024-03-13 NOTE — Progress Notes (Signed)
 PHARMACY CONSULT NOTE FOR:  OUTPATIENT  PARENTERAL ANTIBIOTIC THERAPY (OPAT)  Indication: Esophageal leak Regimen: Meropenem  1g IV every 8 hours and Micafungin  100 mg IV every 24 hours End date: 03/27/24  Noted plans to discharge to SNF, informational and plan to bridge to new patient appointment with ID to determine next steps.   IV antibiotic discharge orders are pended. To discharging provider:  please sign these orders via discharge navigator,  Select New Orders & click on the button choice - Manage This Unsigned Work.     Thank you for allowing pharmacy to be a part of this patient's care.  Almarie Lunger, PharmD, BCPS, BCIDP Infectious Diseases Clinical Pharmacist 03/13/2024 3:28 PM   **Pharmacist phone directory can now be found on [email or web address] ([email or web address]).  Listed under [facility].

## 2024-03-13 NOTE — TOC Transition Note (Addendum)
 Transition of Care [facility]) - Discharge Note   Patient Details  Name: [name] MRN: [id number] Date of Birth: [birth year]  [facility] CM/SW Contact:  Montie LOISE Louder, LCSW Phone Number: 03/13/2024, 4:01 PM   Update: [facility]OME /discharged cancelled- patient will need PICC line for IV abx before he can d/c to SNF.   Clinical Narrative:     Patient will Discharge to: [facility]  Discharge Date: 03/13/2024 Family Notified: sister Transport Ab:[facility]JM  Per MD patient is ready for discharge. RN, patient, and facility notified of discharge. Discharge Summary sent to facility. RN given number for [phone], Room 132-B. Ambulance transport requested for patient.   Clinical Social Worker signing off.  Montie Louder, MSW, LCSW Clinical Social Worker     Final next level of care: Skilled Nursing Facility Barriers to Discharge: Barriers Resolved   Patient Goals and CMS Choice Patient states their goals for this hospitalization and ongoing recovery are:: Rehab CMS Medicare.gov Compare Post Acute Care list provided to:: Patient Represenative (must comment) Choice offered to / list presented to : Sibling      Discharge Placement              Patient chooses bed at:  ([facility]) Patient to be transferred to facility by: ptar Name of family member notified: sister Patient and family notified of of transfer: 03/13/24  Discharge Plan and Services Additional resources added to the After Visit Summary for   In-house Referral: Clinical Social Work Discharge Planning Services: CM Consult Post Acute Care Choice: Skilled Nursing Facility                               Social Drivers of Health (SDOH) Interventions SDOH Screenings   Food Insecurity: No Food Insecurity (01/28/2024)  Housing: Low Risk  (01/28/2024)  Transportation Needs: No Transportation Needs (01/28/2024)  Utilities: Not At Risk (01/28/2024)  Depression (PHQ2-9): Low Risk   (11/25/2023)  Financial Resource Strain: Low Risk  (08/20/2023)  Social Connections: Unknown (01/28/2024)  Stress: No Stress Concern Present (08/20/2023)  Tobacco Use: High Risk (01/28/2024)     Readmission Risk Interventions    11/05/2023   12:23 PM  Readmission Risk Prevention Plan  Transportation Screening Complete  PCP or Specialist Appt within 3-5 Days Complete  HRI or Home Care Consult Complete  Social Work Consult for Recovery Care Planning/Counseling Complete  Palliative Care Screening Complete  Medication Review Oceanographer) Complete

## 2024-03-13 NOTE — Progress Notes (Signed)
 Occupational Therapy Treatment Patient Details Name: [name] MRN: [id number] DOB: 1955-12-05 Today's Date: 03/13/2024   History of present illness 68 y.o. male presents to [facility] hospital on 01/15/2024 with gastric CA. Pt underwent total gastrectomy with omentectomy and D2 lymphadenectomy, esophagojejunostomy with Jtube placement, splenic biopsy on 9/3. Pt developed bilious output into chest tube on 9/4, returned to OR for EGD and thoracoscopy with application of wound matrix to EJ anastomosis. ETT 9/11-9/12 for desats and unresponsiveness. S/p EGD and esophageal stent placement 9/11. Cardiac arrest 9/19. Re-ett 9/19-9/22. 9/26 PEA respiratory failure with intubation extubation 9/27.  Psychiatry evaluation to determine competency and per MD note, at time of evaluation, patient lacked medical decisional capacity regarding disposition... Recommend speaking with HCPOA regarding any dispositional planning and possibly consult palliative care to clarify GOC. PMH includes asthma, OA, COPD, GERD, DMII, MDD, emphysema.   OT comments  Patient seen in order to progress towards goals of ADL independence and increase functional mobility. Patient with improved cognition, though STM deficits and sequencing deficits noted. Patient able to complete toileting and grooming ADLs, with no increased dizziness noted in session. OT recommendation remains appropriate. OT will continue to follow acutely.       If plan is discharge home, recommend the following:  A little help with walking and/or transfers;A little help with bathing/dressing/bathroom;Assistance with cooking/housework;Direct supervision/assist for medications management;Direct supervision/assist for financial management;Assist for transportation;Help with stairs or ramp for entrance;Supervision due to cognitive status   Equipment Recommendations  Other (comment);BSC/3in1 (defer to next venue)    Recommendations for Other Services      Precautions /  Restrictions Precautions Precautions: Fall Recall of Precautions/Restrictions: Impaired Precaution/Restrictions Comments: J tube, R port, tube feeds paused during session; watch orthostatics abdomen binder Restrictions Weight Bearing Restrictions Per Provider Order: No       Mobility Bed Mobility Overal bed mobility: Needs Assistance Bed Mobility: Supine to Sit, Sit to Supine     Supine to sit: Supervision, HOB elevated Sit to supine: Supervision, HOB elevated   General bed mobility comments: Supervision for safety and line management, HOB elevated    Transfers Overall transfer level: Needs assistance Equipment used: None Transfers: Sit to/from Stand Sit to Stand: Min assist           General transfer comment: Min A to navigate IV pole and RW to ambulate to the toilet     Balance Overall balance assessment: Needs assistance Sitting-balance support: No upper extremity supported, Feet supported Sitting balance-Leahy Scale: Good     Standing balance support: During functional activity, No upper extremity supported Standing balance-Leahy Scale: Poor Standing balance comment: Reliant on RW, one LOB standing over the toilet                           ADL either performed or assessed with clinical judgement   ADL Overall ADL's : Needs assistance/impaired     Grooming: Wash/dry hands;Wash/dry face;Set up;Standing                   Toilet Transfer: Minimal assistance;Regular Toilet;Rolling walker (2 wheels)   Toileting- Clothing Manipulation and Hygiene: Contact guard assist       Functional mobility during ADLs: Contact guard assist;Rolling walker (2 wheels) General ADL Comments: Patient seen in order to progress towards goals of ADL independence and increase functional mobility. Patient with improved cognition, though STM deficits and sequencing deficits noted. Patient able to complete toileting and grooming ADLs, with no  increased dizziness noted  in session. OT recommendation remains appropriate. OT will continue to follow acutely.    Extremity/Trunk Assessment              Vision       Restaurant Manager, [employer] Communication: No apparent difficulties   Cognition Arousal: Alert Behavior During Therapy: WFL for tasks assessed/performed Cognition: Cognition impaired   Orientation impairments: Time Awareness: Intellectual awareness intact, Online awareness impaired Memory impairment (select all impairments): Short-term memory, Working memory     OT - Cognition Comments: STM deficits noted, cues for management, decreased safety awareness                 Following commands: Intact        Cueing   Cueing Techniques: Verbal cues  Exercises      Shoulder Instructions       General Comments VSS on RA    Pertinent Vitals/ Pain       Pain Assessment Pain Assessment: Faces Faces Pain Scale: Hurts little more Pain Location: chronic low back pain Pain Descriptors / Indicators: Discomfort, Grimacing (nausea) Pain Intervention(s): Limited activity within patient's tolerance, Monitored during session, Repositioned  Home Living                                          Prior Functioning/Environment              Frequency  Min 2X/week        Progress Toward Goals  OT Goals(current goals can now be found in the care plan section)  Progress towards OT goals: Progressing toward goals  Acute Rehab OT Goals Patient Stated Goal: to get out of here OT Goal Formulation: With patient Time For Goal Achievement: 03/20/24 Potential to Achieve Goals: Fair  Plan      Co-evaluation                 AM-PAC OT 6 Clicks Daily Activity     Outcome Measure   Help from another person eating meals?: A Little Help from another person taking care of personal grooming?: A Little Help from another person toileting, which includes using toliet, bedpan, or  urinal?: A Little Help from another person bathing (including washing, rinsing, drying)?: A Little Help from another person to put on and taking off regular upper body clothing?: A Little Help from another person to put on and taking off regular lower body clothing?: A Little 6 Click Score: 18    End of Session Equipment Utilized During Treatment: Rolling walker (2 wheels)  OT Visit Diagnosis: Unsteadiness on feet (R26.81)   Activity Tolerance Patient tolerated treatment well   Patient Left in bed;with call bell/phone within reach;with bed alarm set   Nurse Communication Mobility status;Precautions        Time: 9095-9066 OT Time Calculation (min): 29 min  Charges: OT General Charges $OT Visit: 1 Visit OT Treatments $Self Care/Home Management : 23-37 mins  Ronal Gift E. Dwayne Begay, OTR/L Acute Rehabilitation Services [phone]   Ronal Gift Salt 03/13/2024, 1:29 PM

## 2024-03-13 NOTE — Progress Notes (Signed)
 Called from Damar with [facility] regarding the patient needing to D/C to SNF on chronic IV antibiotics. He has had a complicated stay here in the hospital following a gastrectomy for adenocarcinoma on 9/03 that was complicated by esophageal leak requiring a stent and has had leaking in to pleural cavity related to this including Afib and respiratory failure requiring ICU stays.   Reviewed cultures - ceftaz/cefepime  and zosyn  resistant pseudomonas and citrobacter KOSERI .  No bacteremia/fungemia present thought multiple bcx checks.   He is NPO with small bore JP drain in place. Stent was placed 9/11 with recent EGD on 9/25 required to reposition it. He has since had 4 weeks of treatment; planned 6 weeks.   D/W Dr. Fleeta Rothman and Garrel from [facility] team to help coordinate IV abx and follow up in ID clinic for antibiotic management.   I have scheduled a New Patient appt in ID clinic with Dr. Overton on 03/24/2024 3:00 PM after [facility] and TCTS follow ups to help further determine next course of action while stent is in place.  OPAT ORDERS:  Diagnosis: Empyema / Esophageal leak s/p stent   Culture Result: Pleural fluid 9/24    Allergies  Allergen Reactions   Dilaudid  [Hydromorphone ] Itching   Lisinopril Nausea Only     Discharge antibiotics to be given via PICC line:  Meropenem  1g IV every 8 hours and Micafungin  100 mg IV every 24 hours   Duration: 2 more weeks (to complete 6 weeks)   End Date: 03/27/24   [facility] Care Per Protocol with Biopatch Use: Home health RN for IV administration and teaching, line care and labs.    Labs weekly while on IV antibiotics: _x_ CBC with differential __ BMP **TWICE WEEKLY ON VANCOMYCIN   _x_ CMP __ CRP __ ESR __ Vancomycin  trough TWICE WEEKLY __ CK  __ Please pull PIC at completion of IV antibiotics _x_ Please leave PIC in place until doctor has seen patient or been notified  Fax weekly labs to [phone]  Clinic Follow Up Appt: 03/24/2024 3:00 PM      Corean Fireman, MSN, NP-C [facility]  [email or web address] Pager: [phone] Office: [phone] [facility] Main Line: [phone] *Secure Chat Communication Welcome  Total Encounter Time: no charge

## 2024-03-14 ENCOUNTER — Other Ambulatory Visit: Payer: Self-pay

## 2024-03-14 LAB — RENAL FUNCTION PANEL
Albumin: 2.3 g/dL — ABNORMAL LOW (ref 3.5–5.0)
Anion gap: 11 (ref 5–15)
BUN: 32 mg/dL — ABNORMAL HIGH (ref 8–23)
CO2: 29 mmol/L (ref 22–32)
Calcium: 9.4 mg/dL (ref 8.9–10.3)
Chloride: 95 mmol/L — ABNORMAL LOW (ref 98–111)
Creatinine, Ser: 0.66 mg/dL (ref 0.61–1.24)
GFR, Estimated: >60 mL/min (ref >60)
Glucose, Bld: 131 mg/dL — ABNORMAL HIGH (ref 70–99)
Phosphorus: 3.7 mg/dL (ref 2.5–4.6)
Potassium: 3.9 mmol/L (ref 3.5–5.1)
Sodium: 135 mmol/L (ref 135–145)

## 2024-03-14 MED ORDER — HEPARIN SOD (PORK) LOCK FLUSH 100 UNIT/ML IV SOLN
250.0000 [IU] | INTRAVENOUS | Status: AC | PRN
  Administered 2024-03-14: 250 [IU]

## 2024-03-14 MED ORDER — HEPARIN SOD (PORK) LOCK FLUSH 100 UNIT/ML IV SOLN
500.0000 [IU] | INTRAVENOUS | Status: AC | PRN
  Administered 2024-03-14: 500 [IU]

## 2024-03-14 NOTE — Progress Notes (Signed)
 Peripherally Inserted Central Catheter Placement  The IV Nurse has discussed with the patient and/or persons authorized to consent for the patient, the purpose of this procedure and the potential benefits and risks involved with this procedure.  The benefits include less needle sticks, lab draws from the catheter, and the patient may be discharged home with the catheter. Risks include, but not limited to, infection, bleeding, blood clot (thrombus formation), and puncture of an artery; nerve damage and irregular heartbeat and possibility to perform a PICC exchange if needed/ordered by physician.  Alternatives to this procedure were also discussed.  Bard Power PICC patient education guide, fact sheet on infection prevention and patient information card has been provided to patient /or left at bedside.    PICC Placement Documentation  PICC Single Lumen 03/14/24 Left Basilic 44 cm 0 cm (Active)  Indication for Insertion or Continuance of Line Prolonged intravenous therapies 03/14/24 1557  Exposed Catheter (cm) 0 cm 03/14/24 1557  Site Assessment Clean, Dry, Intact 03/14/24 1557  Line Status Flushed;Saline locked;Blood return noted 03/14/24 1557  Dressing Type Transparent;Securing device 03/14/24 1557  Dressing Status Antimicrobial disc/dressing in place;Clean, Dry, Intact 03/14/24 1557  Line Care Connections checked and tightened 03/14/24 1557  Line Adjustment (NICU/IV Team Only) No 03/14/24 1557  Dressing Intervention New dressing;Adhesive placed at insertion site (IV team only) 03/14/24 1557  Dressing Change Due 03/21/24 03/14/24 1557       Karlo Goeden Sheral Ruth 03/14/2024, 4:00 PM

## 2024-03-14 NOTE — TOC Transition Note (Addendum)
 Transition of Care [facility]) - Discharge Note   Patient Details  Name: [name] MRN: [id number] Date of Birth: 1956/04/09  [facility] CM/SW Contact:  [name], [place] Phone Number: 03/14/2024, 4:03 PM   Clinical Narrative:  Pt for dc to [facility]. Confirmed with Etta in admissions they are prepared to admit pt to room 132. Pt's sister [name] aware of dc and reports agreeable. RN provided with number for report and PTAR arranged for transport. SW signing off at dc.   Julien Jon, MSW, LCSW [phone] (coverage)      Final next level of care: Skilled Nursing Facility Barriers to Discharge: Barriers Resolved   Patient Goals and CMS Choice Patient states their goals for this hospitalization and ongoing recovery are:: Rehab CMS Medicare.gov Compare Post Acute Care list provided to:: Patient Represenative (must comment) Choice offered to / list presented to : Sibling      Discharge Placement              Patient chooses bed at:  ([facility]) Patient to be transferred to facility by: ptar Name of family member notified: sister Patient and family notified of of transfer: 03/13/24  Discharge Plan and Services Additional resources added to the After Visit Summary for   In-house Referral: Clinical Social Work Discharge Planning Services: CM Consult Post Acute Care Choice: Skilled Nursing Facility                               Social Drivers of Health (SDOH) Interventions SDOH Screenings   Food Insecurity: No Food Insecurity (01/28/2024)  Housing: Low Risk  (01/28/2024)  Transportation Needs: No Transportation Needs (01/28/2024)  Utilities: Not At Risk (01/28/2024)  Depression (PHQ2-9): Low Risk  (11/25/2023)  Financial Resource Strain: Low Risk  (08/20/2023)  Social Connections: Unknown (01/28/2024)  Stress: No Stress Concern Present (08/20/2023)  Tobacco Use: High Risk (01/28/2024)     Readmission Risk Interventions     11/05/2023   12:23 PM  Readmission Risk Prevention Plan  Transportation Screening Complete  PCP or Specialist Appt within 3-5 Days Complete  HRI or Home Care Consult Complete  Social Work Consult for Recovery Care Planning/Counseling Complete  Palliative Care Screening Complete  Medication Review Oceanographer) Complete

## 2024-03-14 NOTE — Plan of Care (Signed)
  Problem: Pain Managment: Goal: General experience of comfort will improve and/or be controlled Outcome: Progressing

## 2024-03-14 NOTE — Progress Notes (Signed)
 Progress Note  37 Days Post-Op  Subjective: No significant new concerns.wants to know when [name] is leaving.  Having bowel movements. Denies nausea, vomiting, abdominal pain.   ROS  All negative with the exception of above.  Objective: Vital signs in last 24 hours: Temp:  [97.7 F (36.5 C)-98.9 F (37.2 C)] 97.7 F (36.5 C) (11/01 0805) Pulse Rate:  [84-94] 87 (11/01 0805) Resp:  [16-19] 19 (11/01 0805) BP: (108-142)/(78-98) 142/89 (11/01 0805) SpO2:  [95 %-99 %] 96 % (11/01 0805) Weight:  [73.9 kg] 73.9 kg (11/01 0250) Last BM Date : 03/11/24  Intake/Output from previous day: 10/31 0701 - 11/01 0700 In: 7242.8 [I.V.:10; WH/HU:3982.1; IV Piggyback:1215] Out: 1520 [Urine:1520] Intake/Output this shift: Total I/O In: 210 [I.V.:10; NG/GT:100; IV Piggyback:100] Out: -   PE: General: [name] who is sitting up in bed in NAD. HEENT: Head is normocephalic, atraumatic.  Heart: Normal HR. . Lungs: Respiratory effort nonlabored. Abd: Soft, NT. Gastrostomy/enterostomy jejunostomy in place. Skin: Warm and dry. Psych: A&Ox3 with an appropriate affect.    Lab Results:  No results for input(s): WBC, HGB, HCT, PLT in the last 72 hours. BMET Recent Labs    03/13/24 0545 03/14/24 0500  NA 133* 135  K 4.0 3.9  CL 93* 95*  CO2 29 29  GLUCOSE 121* 131*  BUN 30* 32*  CREATININE 0.75 0.66  CALCIUM  9.0 9.4   PT/INR No results for input(s): LABPROT, INR in the last 72 hours. CMP     Component Value Date/Time   NA 135 03/14/2024 0500   K 3.9 03/14/2024 0500   CL 95 (L) 03/14/2024 0500   CO2 29 03/14/2024 0500   GLUCOSE 131 (H) 03/14/2024 0500   BUN 32 (H) 03/14/2024 0500   CREATININE 0.66 03/14/2024 0500   CREATININE 0.85 10/10/2023 0824   CALCIUM  9.4 03/14/2024 0500   PROT 6.3 (L) 03/13/2024 0950   ALBUMIN  2.3 (L) 03/14/2024 0500   AST 24 03/13/2024 0950   AST 15 10/10/2023 0824   ALT 26 03/13/2024 0950   ALT 11 10/10/2023 0824   ALKPHOS 75  03/13/2024 0950   BILITOT 0.3 03/13/2024 0950   BILITOT 0.5 10/10/2023 0824   GFRNONAA >60 03/14/2024 0500   GFRNONAA >60 10/10/2023 0824   GFRAA  10/16/2007 1246    >60        The eGFR has been calculated using the MDRD equation. This calculation has not been validated in all clinical   Lipase  No results found for: LIPASE     Studies/Results: US  EKG SITE RITE Result Date: 03/14/2024 If Site Rite image not attached, placement could not be confirmed due to current cardiac rhythm.   Anti-infectives: Anti-infectives (From admission, onward)    Start     Dose/Rate Route Frequency Ordered Stop   03/13/24 0000  meropenem  (MERREM ) IVPB        1 g Intravenous Every 8 hours 03/13/24 1535 03/27/24 2359   03/13/24 0000  micafungin  (MYCAMINE ) 50 MG injection        100 mg Intravenous Every 24 hours 03/13/24 1535 03/27/24 2359   02/07/24 2000  vancomycin  (VANCOCIN ) IVPB 1000 mg/200 mL premix  Status:  Discontinued        1,000 mg 200 mL/hr over 60 Minutes Intravenous Every 12 hours 02/07/24 1229 02/08/24 0725   02/07/24 0915  vancomycin  (VANCOREADY) IVPB 1500 mg/300 mL        1,500 mg 150 mL/hr over 120 Minutes Intravenous  Once 02/07/24 9176  02/07/24 1125   02/02/24 0400  micafungin  (MYCAMINE ) 100 mg in sodium chloride  0.9 % 100 mL IVPB        100 mg 105 mL/hr over 1 Hours Intravenous Daily 02/02/24 0248 03/15/24 1400   02/02/24 0400  meropenem  (MERREM ) 1,000 mg in sodium chloride  0.9 % 100 mL IVPB        1,000 mg 200 mL/hr over 30 Minutes Intravenous Every 8 hours 02/02/24 0248 03/15/24 1400   02/02/24 0315  vancomycin  (VANCOCIN ) IVPB 1000 mg/200 mL premix        1,000 mg 200 mL/hr over 60 Minutes Intravenous  Once 02/02/24 0228 02/02/24 0510   01/31/24 1800  piperacillin -tazobactam (ZOSYN ) IVPB 3.375 g  Status:  Discontinued        3.375 g 12.5 mL/hr over 240 Minutes Intravenous Every 8 hours 01/31/24 1639 02/02/24 0248   01/31/24 1745  fluconazole  (DIFLUCAN ) IVPB 400 mg   Status:  Discontinued        400 mg 100 mL/hr over 120 Minutes Intravenous Every 24 hours 01/31/24 1659 02/02/24 0248   01/22/24 1000  fluconazole  (DIFLUCAN ) IVPB 400 mg       Placed in Followed by Linked Group   400 mg 100 mL/hr over 120 Minutes Intravenous Every 24 hours 01/21/24 0956 01/30/24 1134   01/21/24 1045  fluconazole  (DIFLUCAN ) IVPB 800 mg       Placed in Followed by Linked Group   800 mg 200 mL/hr over 120 Minutes Intravenous  Once 01/21/24 0956 01/21/24 1441   01/21/24 0945  piperacillin -tazobactam (ZOSYN ) IVPB 3.375 g        3.375 g 12.5 mL/hr over 240 Minutes Intravenous Every 8 hours 01/21/24 0920 01/30/24 2216   01/16/24 1510  ceFAZolin  (ANCEF ) 2-4 GM/100ML-% IVPB       Note to Pharmacy: Cindie Pizza: cabinet override      01/16/24 1510 01/17/24 0314   01/15/24 0645  ceFAZolin  (ANCEF ) IVPB 2g/100 mL premix       Placed in And Linked Group   2 g 200 mL/hr over 30 Minutes Intravenous On call to O.R. 01/15/24 0641 01/15/24 1700   01/15/24 0645  metroNIDAZOLE  (FLAGYL ) IVPB 500 mg       Placed in And Linked Group   500 mg 100 mL/hr over 60 Minutes Intravenous On call to O.R. 01/15/24 9358 01/15/24 0910   01/15/24 0642  ceFAZolin  (ANCEF ) IVPB 2g/100 mL premix  Status:  Discontinued        2 g 200 mL/hr over 30 Minutes Intravenous 30 min pre-op 01/15/24 9357 01/15/24 0644        Assessment/Plan [name] is a 68 yo Velasquez with gastric cardia adenocarcinoma, s/p total gastrectomy, with distal esophagectomy and roux-en-Y esophagojejunostomy on 9/3. S/p takeback by thoracic surgery for RATS repair of anastomotic leak on 9/4. S/p EGD with placement of esophageal stent on 9/11. S/p EGD with repositioning of esophageal stent 9/25.   - Has had respiratory arrest x2 associated with hypercarbia. Extubated 9/27, now on RA - Clonidine  patch discontinued, consider risperdal  ODT - Pain control: scheduled tylenol , prn oxycodone . - Hypertension: home hydrochlorothiazide,  metoprolol  62.5mg  BID. Monitor BP, if further soft BP can decrease doses of BP medications.      - EJ leak: S/p esophageal stent placement. Pleural JP to remain in place. On antibiotics and antifungal coverage per thoracic, will need 6 weeks total.  Thoracic planning for stent exchange at 6 weeks. Okay for discharge to [facility] prior to stent exchange if  accepted. JP out and TCTS observing without further intervention   - FEN: Continue J tube feeds at goal. Patient is tolerating and having bowel function. FWF interval now 30q6h given hyponatremia after initial correction of hypernatremia.   - ID: On broad-spectrum abx and antifungal coverage (Merrem  and Micafungin ) for treatment of aspiration pneumonia and EJ leak. No good enteral options per pharmacy in addition to concern for TF interfering with absorption.  - A-fib: remains in sinus rhythm. On therapeutic lovenox . - Code status: back to full code per patient wishes - Dispo: 4NP SW working on SNF placement    LOS: 59 days   I reviewed specialist notes, consulting provider notes, nursing notes, last 24 h vitals and pain scores, last 48 h intake and output, last 24 h labs and trends, and last 24 h imaging results.  This care required low level of medical decision making.    Camellia Blush, MD  [facility] 03/14/2024, 11:31 AM Please see Amion for pager number during day hours 7:00am-4:30pm

## 2024-03-14 NOTE — Progress Notes (Signed)
 Nurse called in for report to [facility]. Nursing receiving report [name]. Jon LITTIE Morones, RN

## 2024-03-14 NOTE — Progress Notes (Addendum)
 Secure chat with Dr Dasie and CM re PICC order.  [name] CM/SW states SNF/rehab cannot take a port and requires a PICC for ABT. Explained reasoning to pt, consent obtained and PICC placed.  [name] notified of successful placement around 1615.

## 2024-03-14 NOTE — TOC Progression Note (Signed)
 Transition of Care [facility]) - Progression Note    Patient Details  Name: [name] MRN: [id number] Date of Birth: [birth year]  [facility]) CM/SW Contact  Gwenn Frieze [place], [place] Phone Number: 03/14/2024, 10:48 AM  Clinical Narrative:  Pt was for dc yesterday to SNF but dc cancelled due to pt needing a PICC line placed and approval of IV antibiotics from SNF. Spoke to [place] with [facility] who confirmed they are able to provide current IV antibiotics. Messaged MD and RN re PICC placement as this is the current barrier to dc.   Frieze Gwenn, MSW, LCSW [phone] (coverage)       Expected Discharge Plan: Skilled Nursing Facility Barriers to Discharge: Barriers Resolved               Expected Discharge Plan and Services In-house Referral: Clinical Social Work Discharge Planning Services: CM Consult Post Acute Care Choice: Skilled Nursing Facility Living arrangements for the past 2 months: Single Family Home Expected Discharge Date: 03/13/24                                     Social Drivers of Health (SDOH) Interventions SDOH Screenings   Food Insecurity: No Food Insecurity (01/28/2024)  Housing: Low Risk  (01/28/2024)  Transportation Needs: No Transportation Needs (01/28/2024)  Utilities: Not At Risk (01/28/2024)  Depression (PHQ2-9): Low Risk  (11/25/2023)  Financial Resource Strain: Low Risk  (08/20/2023)  Social Connections: Unknown (01/28/2024)  Stress: No Stress Concern Present (08/20/2023)  Tobacco Use: High Risk (01/28/2024)    Readmission Risk Interventions    11/05/2023   12:23 PM  Readmission Risk Prevention Plan  Transportation Screening Complete  PCP or Specialist Appt within 3-5 Days Complete  HRI or Home Care Consult Complete  Social Work Consult for Recovery Care Planning/Counseling Complete  Palliative Care Screening Complete  Medication Review Ocean[employer]) Complete

## 2024-03-14 NOTE — Discharge Summary (Signed)
 Discharge Summary   Patient ID: [name] [id number] 68 y.o. 04/08/1956  Admit date: 01/15/2024  Discharge date and time: 03/14/2024  Admitting Physician: [name], MD   Discharge Provider/Physician: Ozell Shaper, PA-C  Admission Diagnoses: Malignant neoplasm of cardia (HCC) [C16.0] Paroxysmal atrial fibrillation (HCC) [I48.0] Atherosclerosis of native coronary artery of native heart without angina pectoris [I25.10] Adenocarcinoma of gastric cardia (HCC) [C16.0]  Discharge Diagnoses: Malignant neoplasm of gastric cardia and gastroesophageal junction Esophagojejunal anastomotic leak Aspiration pneumonia Atrial fibrillation  Indication for Admission: [name] is a 68 yo male who was diagnosed with a gastric cardia cancer earlier this year. Staging workup including diagnostic laparoscopy showed no evidence of metastatic disease. He completed 3 cycles neoadjuvant FLOT with overall poor tolerance, and staging scans showed no evidence of disease progression. After a discussion of the risks and benefits of surgery, he consented to proceed. Please see separately dictated H&P.  Hospital Course: The patient was taken to the OR on 01/15/24 for an open total gastrectomy, with a roux-en-Y EJ anastomosis. His EJ anastomosis was done via a robotic transthoracic approach, with distal esophagectomy. Please see separately dictated operative notes for further details. Postoperatively he was admitted to the progressive care unit in stable condition. His pleural drains became bilious on POD1, and he was taken back to the OR for placement of additional sutures in the anastomosis and placement of a wound matrix. He was started on broad spectrum antibiotics and antifungal coverage. Over the next few days he had return of bowel function and jejunal tube feeds were started. He went into a-fib and cardiology was consulted, and he was started on diltiazem  and metoprolol . He later converted back to sinus rhythm.  His pleural drains again became bilious on POD7, and later that night he developed respiratory distress requiring intubation. He was taken on 9/11 for an EGD with placement of an esophageal stent. He was able to be extubated the following day. He had significant delirium over the next few days. He then had a respiratory arrest on 9/20 requiring reintubation, with a suspected aspiration event. He was transferred back to the ICU. His antibiotics were broadened and pressors were slowly weaned. He was extubated on 9/22 and pressors were weaned off. He continued to have issues with delirium, and his drain again became bilious. He was taken for an EGD with repositioning of the esophageal stent on 9/25. Following the procedure later that night, he became hypercarbic and had another respiratory arrest, and was again reintubated. He was weaned and extubated on 9/27, and mental status significantly improved. He was on precedex  for agitation and delirium, which was slowly weaned over the next few days. He continued to tolerate goal tube feeds. Psychiatry was consulted on 10/7 to determine competence for medical decision making and pre SNF evaluation. They determined that patient lacked medical decisional capacity regarding disposition as evidenced by lack of orientation and evasive responses to questions regarding disposition, his understanding of his own medical conditions, and risks/benefits to his choices. On 10/26 pleural JP drain inadvertently removed. Thoracic surgery has arranged for outpatient stent exchange. Patient continues to be on broad-spectrum antibiotics and antifungal coverage for treatment. Will need to stay on abx till stent is removed per thoracic. ID consulted to assist w/ abx at d/c. Patient worked with therapies during admission and was recommended for SNF. SNF requested PICC line for abx prior to d/c. On 11/1 the patient was felt stable for discharge to SNF. Please see progress notes for more  details.    Consults: Thoracic surgery, cardiology, psychiatry, CCM ,ID  Disposition: Discharge disposition: 03-Skilled Nursing Facility      Exam: Please see MD's attestation for physical exam from day of discharge  Patient Instructions:  Allergies as of 03/14/2024       Reactions   Dilaudid  [hydromorphone ] Itching   Lisinopril Nausea Only        Medication List     PAUSE taking these medications    Linzess  145 MCG Caps capsule Wait to take this until your doctor or other care provider tells you to start again. Generic drug: linaclotide  Take 145 mcg by mouth at bedtime.   losartan 100 MG tablet Wait to take this until your doctor or other care provider tells you to start again. Commonly known as: COZAAR Take 100 mg by mouth daily.   Mens 50+ Multivitamin Tabs Wait to take this until your doctor or other care provider tells you to start again. Take 1 tablet by mouth daily.   metFORMIN 500 MG 24 hr tablet Wait to take this until your doctor or other care provider tells you to start again. Commonly known as: GLUCOPHAGE-XR Take 500 mg by mouth every evening.   pregabalin  25 MG capsule Wait to take this until your doctor or other care provider tells you to start again. Commonly known as: LYRICA  Take 25 mg by mouth at bedtime.   rosuvastatin  20 MG tablet Wait to take this until your doctor or other care provider tells you to start again. Commonly known as: CRESTOR  Take 20 mg by mouth daily.       STOP taking these medications    Buprenorphine  HCl 900 MCG Film   buPROPion  150 MG 24 hr tablet Commonly known as: WELLBUTRIN  XL   dexamethasone  4 MG tablet Commonly known as: DECADRON    diphenoxylate -atropine  2.5-0.025 MG tablet Commonly known as: LOMOTIL    Eliquis  5 MG Tabs tablet Generic drug: apixaban    hydrochlorothiazide 25 MG tablet Commonly known as: HYDRODIURIL Replaced by: hydrochlorothiazide 10 mg/mL Susp   lidocaine -prilocaine  cream Commonly known  as: EMLA    methocarbamol  750 MG tablet Commonly known as: ROBAXIN    metoprolol  tartrate 50 MG tablet Commonly known as: LOPRESSOR  Replaced by: metoprolol  tartrate 25 mg/10 mL Susp   ondansetron  8 MG disintegrating tablet Commonly known as: ZOFRAN -ODT   oxyCODONE -acetaminophen  10-325 MG tablet Commonly known as: PERCOCET   potassium chloride  SA 20 MEQ tablet Commonly known as: KLOR-CON  M   prochlorperazine  10 MG tablet Commonly known as: COMPAZINE    Vitamin D (Ergocalciferol) 1.25 MG (50000 UNIT) Caps capsule Commonly known as: DRISDOL       TAKE these medications    acetaminophen  160 MG/5ML solution Commonly known as: TYLENOL  20.3 mLs (650 mg total) by Per J Tube route every 8 (eight) hours.   albuterol  108 (90 Base) MCG/ACT inhaler Commonly known as: VENTOLIN  HFA Inhale 1-2 puffs into the lungs every 6 (six) hours as needed for shortness of breath or wheezing.   enoxaparin  80 MG/0.8ML injection Commonly known as: LOVENOX  Inject 0.7 mLs (70 mg total) into the skin every 12 (twelve) hours.   feeding supplement (OSMOLITE 1.5 CAL) Liqd Place 1,000 mLs into feeding tube continuous.   feeding supplement (PROSource TF20) liquid Place 60 mLs into feeding tube 2 (two) times daily.   free water  Soln Place 100 mLs into feeding tube every 4 (four) hours.   hydrochlorothiazide 10 mg/mL Susp Place 2.5 mLs (25 mg total) into feeding tube daily. Replaces: hydrochlorothiazide 25 MG  tablet   lidocaine  5 % Commonly known as: LIDODERM  Place 2 patches onto the skin daily. Remove & Discard patch within 12 hours or as directed by MD   meropenem  IVPB Commonly known as: MERREM  Inject 1 g into the vein every 8 (eight) hours for 14 days. Indication:  Esophageal leak First Dose: Yes Last Day of Therapy:  03/27/24 Labs - Once weekly:  CBC/D and BMP, Labs - Once weekly: ESR and CRP Method of administration: Mini-Bag Plus / Gravity or per SNF protocol (IVPB, etc.)   metoprolol   tartrate 25 mg/10 mL Susp Commonly known as: LOPRESSOR  25 mLs (62.5 mg total) by Per J Tube route 2 (two) times daily. Replaces: metoprolol  tartrate 50 MG tablet   micafungin  50 MG injection Commonly known as: MYCAMINE  Inject 100 mg into the vein daily for 14 days. Indication:  Esophageal leak First Dose: Yes Last Day of Therapy:  03/27/24 Labs - Once weekly:  CBC/D and BMP,ESR and CRP Method of administration: Elastomeric or per SNF protocol (IVPB or IV infusion)   Narcan  4 MG/0.1ML Liqd nasal spray kit Generic drug: naloxone  Place 1 spray into the nose as needed (opioid overdose).   ondansetron  4 MG/2ML Soln injection Commonly known as: ZOFRAN  Inject 2 mLs (4 mg total) into the vein every 6 (six) hours as needed for nausea or vomiting.   oxyCODONE  5 MG/5ML solution Commonly known as: ROXICODONE  5 mLs (5 mg total) by Per J Tube route every 4 (four) hours as needed for severe pain (pain score 7-10).   risperiDONE  0.5 MG disintegrating tablet Commonly known as: RISPERDAL  M-TABS Place 1 tablet (0.5 mg total) under the tongue at bedtime.   sodium chloride  flush 0.9 % Soln Commonly known as: NS 10-40 mLs by Intracatheter route every 12 (twelve) hours.   sodium chloride  flush 0.9 % Soln Commonly known as: NS 10-40 mLs by Intracatheter route as needed (flush).   umeclidinium-vilanterol 62.5-25 MCG/ACT Aepb Commonly known as: ANORO ELLIPTA Inhale 1 puff into the lungs daily.               Home Infusion Instuctions  (From admission, onward)           Start     Ordered   03/13/24 0000  Home infusion instructions       Comments: Planning discharge to SNF  Question:  Instructions  Answer:  Flushing of vascular access device: 0.9% NaCl pre/post medication administration and prn patency; Heparin  100 u/ml, 5ml for implanted ports and Heparin  10u/ml, 5ml for all other central venous catheters.   03/13/24 1535              Signed: Ozell HERO  [facility] 03/14/2024 11:18 AM

## 2024-03-20 ENCOUNTER — Ambulatory Visit: Admitting: Thoracic Surgery (Cardiothoracic Vascular Surgery)

## 2024-03-22 ENCOUNTER — Other Ambulatory Visit: Payer: Self-pay

## 2024-03-24 ENCOUNTER — Ambulatory Visit: Admitting: Internal Medicine

## 2024-03-25 ENCOUNTER — Other Ambulatory Visit: Payer: Self-pay

## 2024-03-27 ENCOUNTER — Ambulatory Visit: Admitting: Thoracic Surgery (Cardiothoracic Vascular Surgery)

## 2024-04-01 ENCOUNTER — Ambulatory Visit (INDEPENDENT_AMBULATORY_CARE_PROVIDER_SITE_OTHER): Admitting: Infectious Diseases

## 2024-04-01 ENCOUNTER — Other Ambulatory Visit: Payer: Self-pay

## 2024-04-01 ENCOUNTER — Encounter: Payer: Self-pay | Admitting: Infectious Diseases

## 2024-04-01 VITALS — BP 103/71 | HR 80 | Temp 97.9°F

## 2024-04-01 DIAGNOSIS — Z1159 Encounter for screening for other viral diseases: Secondary | ICD-10-CM

## 2024-04-01 DIAGNOSIS — Z5181 Encounter for therapeutic drug level monitoring: Secondary | ICD-10-CM

## 2024-04-01 DIAGNOSIS — Z7185 Encounter for immunization safety counseling: Secondary | ICD-10-CM

## 2024-04-01 DIAGNOSIS — A498 Other bacterial infections of unspecified site: Secondary | ICD-10-CM

## 2024-04-01 HISTORY — DX: Encounter for screening for other viral diseases: Z11.59

## 2024-04-01 HISTORY — DX: Encounter for therapeutic drug level monitoring: Z51.81

## 2024-04-01 NOTE — Progress Notes (Signed)
 Labs drawn via PICC line per Dr. Dea. Line flushed with 10 mL normal saline and clamped. Patient tolerated procedure well. PICC dressing peeling up at edges, dressing reinforced.   [name], BSN, RN

## 2024-04-01 NOTE — Progress Notes (Signed)
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 patients less than 1 years old. Performed at [facility], [address], [place], [place] [address]    Pathology  01/15/24 FINAL MICROSCOPIC DIAGNOSIS:  A. SPLEEN, NODULES, EXCISION: Benign serosal hyalinized fibrotic nodules with features suggestive of old hyalinized granulomas Negative for malignancy  B. ESOPHAGEAL MARGIN, EXCISION: Focal invasive well to moderately differentiated adenocarcinoma with associated high-grade dysplasia  C. NEW ESOPHAGEAL MARGIN, EXCISION: Benign esophageal mucosa and submucosa Negative for carcinoma  D. STOMACH AND OMENTUM, ESOPHAGOGASTRECTOMY: Invasive well to moderately differentiated adenocarcinoma invading into the submucosa (pT1b) Tumor measures 3.3 x 2.1 x 0.6 cm and arises at the gastroesophageal junction Esophageal margin focally involved by  adenocarcinoma and high-grade dysplasia (see comment) Gastric margin free of dysplasia and carcinoma One of twenty-five perigastric lymph nodes with metastatic carcinoma (1/25, pN1) Mild chronic gastritis with lymphoid aggregates and focal intestinal metaplasia  E. LEFT GASTRIC LYMPH NODE, EXCISION: Five benign lymph nodes, negative for carcinoma (0/5)  F. FINAL ESOPHAGEAL MARGIN, EXCISION: Benign esophageal mucosa and submucosa Negative for carcinoma  Imaging 01/31/24 CTCAP IMPRESSION: 1. Postsurgical changes consistent with recent distal esophagectomy and gastrectomy with esophagojejunostomy in the lower mediastinum. Thick-walled gas and fluid collection dependently within the right pleural space, overall measuring 2.6 x 13.3 x 15 cm (APxTRxCC), and worrisome for a right empyema. Postsurgical drain in place terminating in the upper portion of the collection. While the esophageal stent extends just above the surgical anastomosis, if there is concern for an anastomotic leak, a fluoroscopic upper GI may be of benefit for further characterization. 2. Patchy, peribronchial ground-glass airspace opacities in both upper lobes, possibly reflecting a developing bronchopneumonia or mild pulmonary edema. 3. No acute abnormality within the abdomen or pelvis. 4. Well-positioned percutaneous gastrojejunostomy tube terminating in the left upper quadrant small bowel.   Aortic Atherosclerosis (ICD10-I70.0) and Emphysema (ICD10-J43.9).  Surgeries/Procedures   9/3 Procedure:  Staging laparoscopy Open splenic biopsy Total gastrectomy with omentectomy and D2 lymphadenectomy Roux-en-Y jejunojejunostomy, with mobilization of a roux limb of jejunum for transthoracic esophagojejunostomy  Feeding jejunostomy tube placement   9/4 - Esophagoscopy - Robotic assisted thoracoscopy - I reinforcement esophageal anastomosis with 5 layer myriad mesh   01/23/24 - Esophagogastroscopy - 125 mm covered  esophageal stent placement   02/06/24 - Esophagogastroscopy - Repositioning of esophageal stent  Assessment/Plan 60 Y O male with prior medical h/o as above with Gastric cardia adenoca s/p total gastrectomy, with distal esophagectomy and Roux-en-Y esophagojejunostomy on 9/3 s/p takeback right thoracic surgery for VATS repair of anastomotic leak with myriad mesh on 9/4, s/p EGD with placement of esophageal stent on 9/11 followed by EGD with repositioning of esophageal stent on 9 /25 with   # EJ Leak  # RT empyema s/p removal of pleural drain  Pertinent micro  9/21 trach aspirate cx PsA 9/21 pleural fluid cx PsA, Citrobacter koseri  Plan  - continue IV meropenem  1g iv q8 hrs and micafungin  as is until next appt 12/3. I will not change to PO ciprofloxacin/fluconazole  today due to concerns for interference of tube feeds affecting enteral absorption. Of note, no fungus positive in cultures  - fu in 2 weeks after CTVS OV on 11/21 for ? Stent removal/exchange as well surgery appt 11/24. Note routed to DR Shyrl - lab  today  # ? HBV/HCV  - reported in chart but patient unaware and will check   # Immunization counseling  - got flu vaccine   I spent 60 minutes involved in face-to-face and non-face-to-face activities for this patient on the day of the visit. Professional time spent includes the following activities: Preparing to see the patient (review of tests), Obtaining and reviewing separately obtained history (discharge record 11/1), Performing a medically appropriate examination and evaluation, Ordering medications/labs, referring and communicating with other health care professionals, Documenting clinical information in the EMR, Independently interpreting results (not separately reported), Communicating results to the patient, Counseling and educating the patient and Care coordination (not separately reported).   Of note, portions of this note may have been created with voice recognition  software. While this note has been edited for accuracy, occasional wrong-word or 'sound-a-like' substitutions may have occurred due to the inherent limitations of voice recognition software.   Annalee Joseph, MD [facility] 04/01/2024, 1:43 PM

## 2024-04-02 ENCOUNTER — Other Ambulatory Visit: Payer: Self-pay

## 2024-04-02 ENCOUNTER — Encounter: Payer: Self-pay | Admitting: Hematology

## 2024-04-03 ENCOUNTER — Other Ambulatory Visit: Payer: Self-pay | Admitting: *Deleted

## 2024-04-03 ENCOUNTER — Encounter: Payer: Self-pay | Admitting: Hematology

## 2024-04-03 ENCOUNTER — Ambulatory Visit
Attending: Thoracic Surgery (Cardiothoracic Vascular Surgery) | Admitting: Thoracic Surgery (Cardiothoracic Vascular Surgery)

## 2024-04-03 VITALS — BP 89/59 | HR 80 | Resp 18 | Ht 71.0 in | Wt 168.0 lb

## 2024-04-03 DIAGNOSIS — C160 Malignant neoplasm of cardia: Secondary | ICD-10-CM

## 2024-04-03 DIAGNOSIS — Z09 Encounter for follow-up examination after completed treatment for conditions other than malignant neoplasm: Secondary | ICD-10-CM

## 2024-04-03 LAB — COMPREHENSIVE METABOLIC PANEL WITH GFR
AG Ratio: 0.9 (calc) — ABNORMAL LOW (ref 1.0–2.5)
ALT: 112 U/L — ABNORMAL HIGH (ref 9–46)
AST: 75 U/L — ABNORMAL HIGH (ref 10–35)
Albumin: 3.3 g/dL — ABNORMAL LOW (ref 3.6–5.1)
Alkaline phosphatase (APISO): 96 U/L (ref 35–144)
BUN/Creatinine Ratio: 44 (calc) — ABNORMAL HIGH (ref 6–22)
BUN: 34 mg/dL — ABNORMAL HIGH (ref 7–25)
CO2: 28 mmol/L (ref 20–32)
Calcium: 9.4 mg/dL (ref 8.6–10.3)
Chloride: 96 mmol/L — ABNORMAL LOW (ref 98–110)
Creat: 0.77 mg/dL (ref 0.70–1.35)
Globulin: 3.5 g/dL (ref 1.9–3.7)
Glucose, Bld: 102 mg/dL — ABNORMAL HIGH (ref 65–99)
Potassium: 4.4 mmol/L (ref 3.5–5.3)
Sodium: 134 mmol/L — ABNORMAL LOW (ref 135–146)
Total Bilirubin: 0.6 mg/dL (ref 0.2–1.2)
Total Protein: 6.8 g/dL (ref 6.1–8.1)
eGFR: 98 mL/min/1.73m2 (ref >=60)

## 2024-04-03 LAB — CBC
HCT: 31.5 % — ABNORMAL LOW (ref 38.5–50.0)
Hemoglobin: 10.5 g/dL — ABNORMAL LOW (ref 13.2–17.1)
MCH: 28.9 pg (ref 27.0–33.0)
MCHC: 33.3 g/dL (ref 32.0–36.0)
MCV: 86.8 fL (ref 80.0–100.0)
MPV: 10.1 fL (ref 7.5–12.5)
Platelets: 461 Thousand/uL — ABNORMAL HIGH (ref 140–400)
RBC: 3.63 Million/uL — ABNORMAL LOW (ref 4.20–5.80)
RDW: 15.5 % — ABNORMAL HIGH (ref 11.0–15.0)
WBC: 8.9 Thousand/uL (ref 3.8–10.8)

## 2024-04-03 LAB — C-REACTIVE PROTEIN: CRP: 8.9 mg/L — ABNORMAL HIGH (ref <8.0)

## 2024-04-03 LAB — HEPATITIS B SURFACE ANTIGEN: Hepatitis B Surface Ag: NONREACTIVE

## 2024-04-03 LAB — HEPATITIS B CORE ANTIBODY, TOTAL: Hep B Core Total Ab: REACTIVE — AB

## 2024-04-03 LAB — HEPATITIS C RNA QUANTITATIVE
HCV Quantitative Log: <1.18 {Log_IU}/mL
HCV RNA, PCR, QN: <15 [IU]/mL

## 2024-04-03 LAB — HEPATITIS B SURFACE ANTIBODY,QUALITATIVE: Hep B S Ab: REACTIVE — AB

## 2024-04-03 NOTE — Progress Notes (Signed)
     [address] [name] [address]             [phone]       Patient presents today in follow-up.  He is currently in a rehab center.  He denies any chest pain or shortness of breath.  I will plan for an upper endoscopy with removal of esophageal stent.  The risks and benefits were discussed and he is agreeable to proceed.  [name]

## 2024-04-04 DIAGNOSIS — Z7185 Encounter for immunization safety counseling: Secondary | ICD-10-CM

## 2024-04-04 DIAGNOSIS — A498 Other bacterial infections of unspecified site: Secondary | ICD-10-CM

## 2024-04-04 HISTORY — DX: Other bacterial infections of unspecified site: A49.8

## 2024-04-04 HISTORY — DX: Encounter for immunization safety counseling: Z71.85

## 2024-04-06 ENCOUNTER — Ambulatory Visit: Payer: Self-pay | Admitting: Infectious Diseases

## 2024-04-06 ENCOUNTER — Encounter (HOSPITAL_COMMUNITY): Payer: Self-pay | Admitting: Thoracic Surgery (Cardiothoracic Vascular Surgery)

## 2024-04-06 NOTE — Progress Notes (Signed)
 Patient is in [facility] phone number [phone].  Information and Instruction was faxed to  [phone].  PCP - Dr Arnett Repress Cardiologist - Dr Redell Leiter Oncology - Dr Onita Mattock  Chest x-ray - DOS EKG - 01/18/24 Stress Test - n/a ECHO - 02/02/24 Cardiac Cath - n/a  ICD Pacemaker/Loop - n/a  Sleep Study -  n/a  Diabetes Type 2 Do not take Metformin on the morning of surgery.  Aspirin - n/a  Lovenox  - continue per MD even on DOS.  NPO  Anesthesia review: Yes  STOP now taking any Aspirin (unless otherwise instructed by your surgeon), Aleve, Naproxen, Ibuprofen , Motrin , Advil , Goody's, BC's, all herbal medications, fish oil, and all vitamins.   Coronavirus Screening Do you have any of the following symptoms:  Cough yes/no: No Fever (>100.63F)  yes/no: No Runny nose yes/no: No Sore throat yes/no: No Difficulty breathing/shortness of breath  yes/no: No  Have you traveled in the last 14 days and where? yes/no: No  Patient verbalized understanding of instructions that were given via phone.

## 2024-04-07 ENCOUNTER — Encounter (HOSPITAL_COMMUNITY): Payer: Self-pay | Admitting: Thoracic Surgery (Cardiothoracic Vascular Surgery)

## 2024-04-07 NOTE — Progress Notes (Signed)
 SDW CALL  Patient was given pre-op instructions over the phone. The opportunity was given for the patient to ask questions. No further questions asked. Patient verbalized understanding of instructions given.   PCP -  Cardiologist -   PPM/ICD -  Device Orders -  Rep Notified -   Chest x-ray -  EKG -  Stress Test -  ECHO -  Cardiac Cath -   Sleep Study -  CPAP -   Fasting Blood Sugar -  Checks Blood Sugar _____ times a day  Blood Thinner Instructions: Aspirin Instructions:  ERAS Protcol - PRE-SURGERY Ensure or G2-   COVID TEST-    Anesthesia review:   Patient denies shortness of breath, fever, cough and chest pain over the phone call    Surgical Instructions    Your procedure is scheduled on Wednesday,November 26.  Report to [facility] Main Entrance A at 10 A.M., then check in with the Admitting office.  Call this number if you have problems the morning of surgery:  [phone]    Remember:  Do not eat or drink anything after midnight the night before your surgery.   Take these medicines the morning of surgery with A SIP OF WATER :  enoxaparin  (LOVENOX )  metoprolol  tartrate (LOPRESSOR )  umeclidinium-vilanterol (ANORO ELLIPTA ) inhaler acetaminophen  (TYLENOL ) or oxyCODONE  (ROXICODONE ) if needed ondansetron  (ZOFRAN ) if needed albuterol  (VENTOLIN  HFA) if needed  As of today, STOP taking any Aspirin (unless otherwise instructed by your surgeon) Aleve, Naproxen, Ibuprofen , Motrin , Advil , Goody's, BC's, all herbal medications, fish oil, and all vitamins.  [facility] is not responsible for any belongings or valuables. .   Do NOT Smoke (Tobacco/Vaping)  24 hours prior to your procedure  If you use a CPAP at night, you may bring your mask for your overnight stay.   Contacts, glasses, hearing aids, dentures or partials may not be worn into surgery, please bring cases for these belongings   Patients discharged the day of surgery will not be allowed to drive  home, and someone needs to stay with them for 24 hours.   SURGICAL WAITING ROOM VISITATION You may have 1 visitor in the pre-op area at a time determined by the pre-op nurse. (Visitor may not switch out) Patients having surgery or a procedure in a hospital may have two support people in the waiting room. Children under the age of 24 must have an adult with them who is not the patient. They may stay in the waiting area during the procedure and may switch out with other visitors. If the patient needs to stay at the hospital during part of their recovery, the visitor guidelines for inpatient rooms apply.  Please refer to the [facility] website for the visitor guidelines for Inpatients (after your surgery is over and you are in a regular room).     Special instructions:    Oral Hygiene is also important to reduce your risk of infection.  Remember - BRUSH YOUR TEETH THE MORNING OF SURGERY WITH YOUR REGULAR TOOTHPASTE   Day of Surgery:  Take a shower the day of or night before with antibacterial soap. Wear Clean/Comfortable clothing the morning of surgery Do not apply any deodorants/lotions.   Do not wear jewelry or makeup Do not wear lotions, powders, perfumes/colognes, or deodorant. Do not shave 48 hours prior to surgery.  Men may shave face and neck. Do not bring valuables to the hospital. Do not wear nail polish, gel polish, artificial nails, or any other type of covering on natural  nails (fingers and toes) If you have artificial nails or gel coating that need to be removed by a nail salon, please have this removed prior to surgery. Artificial nails or gel coating may interfere with anesthesia's ability to adequately monitor your vital signs. Remember to brush your teeth WITH YOUR REGULAR TOOTHPASTE.

## 2024-04-07 NOTE — Progress Notes (Signed)
 Notified pt's sister and designated healthcare agent,Betty Idell Eagles, that pt is having a procedure at the hospital tomorrow. Ms. Eagles was aware of the procedure. She would like Dr.Lightfoot to call her after the procedure. She will not be here tomorrow, but said the patient is alert and oriented and can sign his consent. I told her I had faxed instructions to [facility] and spoken to the nurse,[place] Washington  and awaiting a call back to review instructions. She asked me to call her if I did not here from [facility] in the next hour. I said I would.

## 2024-04-07 NOTE — Progress Notes (Signed)
 Patient can continue Lovenox  DOS per conversation with Bernardino Burnetta PEAK.Bernardino Burnetta RN

## 2024-04-07 NOTE — Progress Notes (Signed)
 Surgical Instructions    Your procedure is scheduled on Wednesday,November 26.  Report to [facility] Main Entrance A at 10 A.M., then check in with the Admitting office.  Call this number if you have problems the morning of surgery:  [phone]    Remember:  Do not eat or drink anything after midnight the night before your surgery.   Take these medicines the morning of surgery with A SIP OF WATER :  enoxaparin  (LOVENOX )  metoprolol  tartrate (LOPRESSOR )  umeclidinium-vilanterol (ANORO ELLIPTA ) inhaler acetaminophen  (TYLENOL ) or oxyCODONE  (ROXICODONE ) if needed ondansetron  (ZOFRAN ) if needed albuterol  (VENTOLIN  HFA) if needed  As of today, STOP taking any Aspirin (unless otherwise instructed by your surgeon) Aleve, Naproxen, Ibuprofen , Motrin , Advil , Goody's, BC's, all herbal medications, fish oil, and all vitamins.  [facility] is not responsible for any belongings or valuables. .   Do NOT Smoke (Tobacco/Vaping)  24 hours prior to your procedure  If you use a CPAP at night, you may bring your mask for your overnight stay.   Contacts, glasses, hearing aids, dentures or partials may not be worn into surgery, please bring cases for these belongings   Patients discharged the day of surgery will not be allowed to drive home, and someone needs to stay with them for 24 hours.   SURGICAL WAITING ROOM VISITATION You may have 1 visitor in the pre-op area at a time determined by the pre-op nurse. (Visitor may not switch out) Patients having surgery or a procedure in a hospital may have two support people in the waiting room. Children under the age of 46 must have an adult with them who is not the patient. They may stay in the waiting area during the procedure and may switch out with other visitors. If the patient needs to stay at the hospital during part of their recovery, the visitor guidelines for inpatient rooms apply.  Please refer to the [facility] website for the visitor  guidelines for Inpatients (after your surgery is over and you are in a regular room).     Special instructions:    Oral Hygiene is also important to reduce your risk of infection.  Remember - BRUSH YOUR TEETH THE MORNING OF SURGERY WITH YOUR REGULAR TOOTHPASTE   Day of Surgery:  Take a shower the day of or night before with antibacterial soap. Wear Clean/Comfortable clothing the morning of surgery Do not apply any deodorants/lotions.   Do not wear jewelry or makeup Do not wear lotions, powders, perfumes/colognes, or deodorant. Do not shave 48 hours prior to surgery.  Men may shave face and neck. Do not bring valuables to the hospital. Do not wear nail polish, gel polish, artificial nails, or any other type of covering on natural nails (fingers and toes) If you have artificial nails or gel coating that need to be removed by a nail salon, please have this removed prior to surgery. Artificial nails or gel coating may interfere with anesthesia's ability to adequately monitor your vital signs. Remember to brush your teeth WITH YOUR REGULAR TOOTHPASTE.

## 2024-04-07 NOTE — Progress Notes (Addendum)
 Notified pt's sister,Betty,Jo [facility] had not confirmed receipt of pre op instructions even though I had called them two times since I last spoke with her requesting confirmation.  I reviewed the instructions with Ms. Baldwin. She stated she would call the facility. Shortly thereafter, I received a call from [place] at [facility] who acknowledged receipt of the emailed instructions. . I reviewed the instructions with her. I offered the opportunity to ask questions. She verbalized understanding and had no further questions. According to [facility], faxed instructions were never received ;although our copy said success. I was told that the fax is frequently messed up.

## 2024-04-08 ENCOUNTER — Ambulatory Visit (HOSPITAL_COMMUNITY)

## 2024-04-08 ENCOUNTER — Ambulatory Visit (HOSPITAL_COMMUNITY): Payer: Self-pay | Admitting: Physician Assistant

## 2024-04-08 ENCOUNTER — Encounter (HOSPITAL_COMMUNITY): Payer: Self-pay | Admitting: Physician Assistant

## 2024-04-08 ENCOUNTER — Encounter (HOSPITAL_COMMUNITY): Payer: Self-pay | Admitting: Thoracic Surgery (Cardiothoracic Vascular Surgery)

## 2024-04-08 ENCOUNTER — Observation Stay (HOSPITAL_COMMUNITY)
Admission: RE | Admit: 2024-04-08 | Discharge: 2024-04-09 | Disposition: A | Discharge status: Skilled Nursing Facility | Attending: Thoracic Surgery (Cardiothoracic Vascular Surgery) | Admitting: Thoracic Surgery (Cardiothoracic Vascular Surgery)

## 2024-04-08 ENCOUNTER — Encounter (HOSPITAL_COMMUNITY)
Admission: RE | Disposition: A | Discharge status: Skilled Nursing Facility | Payer: Self-pay | Source: Home / Self Care | Attending: Thoracic Surgery (Cardiothoracic Vascular Surgery)

## 2024-04-08 ENCOUNTER — Observation Stay (HOSPITAL_COMMUNITY)

## 2024-04-08 ENCOUNTER — Other Ambulatory Visit: Payer: Self-pay

## 2024-04-08 DIAGNOSIS — Z4659 Encounter for fitting and adjustment of other gastrointestinal appliance and device: Secondary | ICD-10-CM | POA: Diagnosis not present

## 2024-04-08 DIAGNOSIS — Z9049 Acquired absence of other specified parts of digestive tract: Principal | ICD-10-CM

## 2024-04-08 DIAGNOSIS — Z7984 Long term (current) use of oral hypoglycemic drugs: Secondary | ICD-10-CM | POA: Insufficient documentation

## 2024-04-08 DIAGNOSIS — I2510 Atherosclerotic heart disease of native coronary artery without angina pectoris: Secondary | ICD-10-CM

## 2024-04-08 DIAGNOSIS — Z85028 Personal history of other malignant neoplasm of stomach: Secondary | ICD-10-CM | POA: Diagnosis not present

## 2024-04-08 DIAGNOSIS — Z87891 Personal history of nicotine dependence: Secondary | ICD-10-CM | POA: Insufficient documentation

## 2024-04-08 DIAGNOSIS — J4489 Other specified chronic obstructive pulmonary disease: Secondary | ICD-10-CM | POA: Diagnosis not present

## 2024-04-08 DIAGNOSIS — C160 Malignant neoplasm of cardia: Secondary | ICD-10-CM

## 2024-04-08 DIAGNOSIS — Z79899 Other long term (current) drug therapy: Secondary | ICD-10-CM | POA: Diagnosis not present

## 2024-04-08 DIAGNOSIS — I4891 Unspecified atrial fibrillation: Secondary | ICD-10-CM

## 2024-04-08 DIAGNOSIS — E119 Type 2 diabetes mellitus without complications: Secondary | ICD-10-CM | POA: Diagnosis not present

## 2024-04-08 DIAGNOSIS — K91840 Postprocedural hemorrhage and hematoma of a digestive system organ or structure following a digestive system procedure: Principal | ICD-10-CM | POA: Insufficient documentation

## 2024-04-08 DIAGNOSIS — I10 Essential (primary) hypertension: Secondary | ICD-10-CM | POA: Insufficient documentation

## 2024-04-08 DIAGNOSIS — K9181 Other intraoperative complications of digestive system: Secondary | ICD-10-CM

## 2024-04-08 DIAGNOSIS — Z4589 Encounter for adjustment and management of other implanted devices: Secondary | ICD-10-CM | POA: Diagnosis not present

## 2024-04-08 HISTORY — DX: Acquired absence of other specified parts of digestive tract: Z90.49

## 2024-04-08 HISTORY — PX: GASTROINTESTINAL STENT REMOVAL: SHX6384

## 2024-04-08 LAB — COMPREHENSIVE METABOLIC PANEL WITH GFR
ALT: 44 U/L (ref 0–44)
AST: 33 U/L (ref 15–41)
Albumin: 2.7 g/dL — ABNORMAL LOW (ref 3.5–5.0)
Alkaline Phosphatase: 75 U/L (ref 38–126)
Anion gap: 8 (ref 5–15)
BUN: 20 mg/dL (ref 8–23)
CO2: 27 mmol/L (ref 22–32)
Calcium: 9.3 mg/dL (ref 8.9–10.3)
Chloride: 97 mmol/L — ABNORMAL LOW (ref 98–111)
Creatinine, Ser: 0.85 mg/dL (ref 0.61–1.24)
GFR, Estimated: >60 mL/min (ref >60)
Glucose, Bld: 125 mg/dL — ABNORMAL HIGH (ref 70–99)
Potassium: 3.9 mmol/L (ref 3.5–5.1)
Sodium: 132 mmol/L — ABNORMAL LOW (ref 135–145)
Total Bilirubin: 1.1 mg/dL (ref 0.0–1.2)
Total Protein: 6.5 g/dL (ref 6.5–8.1)

## 2024-04-08 LAB — GLUCOSE, CAPILLARY
Glucose-Capillary: 117 mg/dL — ABNORMAL HIGH (ref 70–99)
Glucose-Capillary: 119 mg/dL — ABNORMAL HIGH (ref 70–99)
Glucose-Capillary: 104 mg/dL — ABNORMAL HIGH (ref 70–99)
Glucose-Capillary: 89 mg/dL (ref 70–99)
Glucose-Capillary: 88 mg/dL (ref 70–99)

## 2024-04-08 LAB — PROTIME-INR
INR: 1.1 (ref 0.8–1.2)
Prothrombin Time: 14.9 s (ref 11.4–15.2)

## 2024-04-08 LAB — TYPE AND SCREEN
ABO/RH(D): A POS
Antibody Screen: NEGATIVE

## 2024-04-08 LAB — CBC
HCT: 30.4 % — ABNORMAL LOW (ref 39.0–52.0)
Hemoglobin: 10.3 g/dL — ABNORMAL LOW (ref 13.0–17.0)
MCH: 29 pg (ref 26.0–34.0)
MCHC: 33.9 g/dL (ref 30.0–36.0)
MCV: 85.6 fL (ref 80.0–100.0)
Platelets: 465 K/uL — ABNORMAL HIGH (ref 150–400)
RBC: 3.55 MIL/uL — ABNORMAL LOW (ref 4.22–5.81)
RDW: 15.9 % — ABNORMAL HIGH (ref 11.5–15.5)
WBC: 9.7 K/uL (ref 4.0–10.5)
nRBC: 0 % (ref 0.0–0.2)

## 2024-04-08 LAB — APTT: aPTT: 35 s (ref 24–36)

## 2024-04-08 SURGERY — EGD, WITH STENT REMOVAL
Anesthesia: General | Site: Esophagus

## 2024-04-08 MED ORDER — INSULIN ASPART 100 UNIT/ML IJ SOLN: 0.0000 [IU] | INTRAMUSCULAR | Status: DC | PRN

## 2024-04-08 MED ORDER — IOHEXOL 300 MG/ML  SOLN
INTRAMUSCULAR | Status: DC | PRN
  Administered 2024-04-08: 75 mL

## 2024-04-08 MED ORDER — OXYCODONE HCL 5 MG/5ML PO SOLN
ORAL | Status: AC
  Filled 2024-04-08: qty 5

## 2024-04-08 MED ORDER — FENTANYL CITRATE (PF) 250 MCG/5ML IJ SOLN
INTRAMUSCULAR | Status: DC | PRN
  Administered 2024-04-08 (×2): 50 ug via INTRAVENOUS

## 2024-04-08 MED ORDER — DEXAMETHASONE SOD PHOSPHATE PF 10 MG/ML IJ SOLN
INTRAMUSCULAR | Status: DC | PRN
  Administered 2024-04-08: 5 mg via INTRAVENOUS

## 2024-04-08 MED ORDER — FREE WATER
100.0000 mL | Status: DC
  Administered 2024-04-09: 100 mL

## 2024-04-08 MED ORDER — INSULIN ASPART 100 UNIT/ML IJ SOLN: 0.0000 [IU] | Freq: Every day | INTRAMUSCULAR | Status: DC

## 2024-04-08 MED ORDER — FENTANYL CITRATE (PF) 100 MCG/2ML IJ SOLN
INTRAMUSCULAR | Status: AC
  Filled 2024-04-08: qty 2

## 2024-04-08 MED ORDER — MIDAZOLAM HCL 2 MG/2ML IJ SOLN
INTRAMUSCULAR | Status: AC
  Filled 2024-04-08: qty 2

## 2024-04-08 MED ORDER — VASOPRESSIN 20 UNIT/ML IV SOLN
INTRAVENOUS | Status: AC
Start: 2024-04-08 — End: 2024-04-08
  Filled 2024-04-08: qty 1

## 2024-04-08 MED ORDER — HYDRALAZINE HCL 20 MG/ML IJ SOLN: 10.0000 mg | Freq: Four times a day (QID) | INTRAMUSCULAR | Status: DC | PRN

## 2024-04-08 MED ORDER — ENOXAPARIN SODIUM 80 MG/0.8ML IJ SOSY
70.0000 mg | PREFILLED_SYRINGE | Freq: Two times a day (BID) | INTRAMUSCULAR | Status: DC
  Administered 2024-04-08 – 2024-04-09 (×2): 70 mg via SUBCUTANEOUS
  Filled 2024-04-08 (×2): qty 0.8

## 2024-04-08 MED ORDER — HYDROCHLOROTHIAZIDE 10 MG/ML ORAL SUSPENSION
25.0000 mg | Freq: Every day | ORAL | Status: DC
  Filled 2024-04-08: qty 2.5

## 2024-04-08 MED ORDER — ALBUTEROL SULFATE HFA 108 (90 BASE) MCG/ACT IN AERS
1.0000 | INHALATION_SPRAY | Freq: Four times a day (QID) | RESPIRATORY_TRACT | Status: DC | PRN
Start: 2024-04-08 — End: 2024-04-08

## 2024-04-08 MED ORDER — 0.9 % SODIUM CHLORIDE (POUR BTL) OPTIME
TOPICAL | Status: DC | PRN
  Administered 2024-04-08: 1000 mL

## 2024-04-08 MED ORDER — OSMOLITE 1.5 CAL PO LIQD
1000.0000 mL | ORAL | Status: AC
  Administered 2024-04-08: 1000 mL
  Filled 2024-04-08: qty 1000

## 2024-04-08 MED ORDER — OXYCODONE HCL 5 MG/5ML PO SOLN
5.0000 mg | ORAL | Status: DC | PRN
  Administered 2024-04-08 – 2024-04-09 (×2): 5 mg via JEJUNOSTOMY
  Filled 2024-04-08 (×2): qty 5

## 2024-04-08 MED ORDER — PROPOFOL 10 MG/ML IV BOLUS
INTRAVENOUS | Status: AC
  Filled 2024-04-08: qty 20

## 2024-04-08 MED ORDER — LACTATED RINGERS IV SOLN: INTRAVENOUS | Status: DC

## 2024-04-08 MED ORDER — OXYCODONE HCL 5 MG PO TABS: 5.0000 mg | ORAL_TABLET | Freq: Once | ORAL | Status: AC | PRN

## 2024-04-08 MED ORDER — LIDOCAINE 5 % EX PTCH: 2.0000 | MEDICATED_PATCH | CUTANEOUS | Status: DC

## 2024-04-08 MED ORDER — LIDOCAINE 2% (20 MG/ML) 5 ML SYRINGE
INTRAMUSCULAR | Status: AC
  Filled 2024-04-08: qty 5

## 2024-04-08 MED ORDER — PROPOFOL 10 MG/ML IV BOLUS
INTRAVENOUS | Status: DC | PRN
  Administered 2024-04-08: 80 mg via INTRAVENOUS

## 2024-04-08 MED ORDER — ROCURONIUM BROMIDE 10 MG/ML (PF) SYRINGE
PREFILLED_SYRINGE | INTRAVENOUS | Status: AC
  Filled 2024-04-08: qty 10

## 2024-04-08 MED ORDER — UMECLIDINIUM-VILANTEROL 62.5-25 MCG/ACT IN AEPB
1.0000 | INHALATION_SPRAY | Freq: Every day | RESPIRATORY_TRACT | Status: DC
  Administered 2024-04-09: 1 via RESPIRATORY_TRACT
  Filled 2024-04-08: qty 14

## 2024-04-08 MED ORDER — OXYCODONE HCL 5 MG/5ML PO SOLN
5.0000 mg | Freq: Once | ORAL | Status: AC | PRN
  Administered 2024-04-08: 5 mg via ORAL

## 2024-04-08 MED ORDER — ACETAMINOPHEN 160 MG/5ML PO SOLN
650.0000 mg | Freq: Three times a day (TID) | ORAL | Status: DC | PRN
Start: 2024-04-08 — End: 2024-04-09

## 2024-04-08 MED ORDER — CHLORHEXIDINE GLUCONATE 0.12 % MT SOLN
15.0000 mL | Freq: Once | OROMUCOSAL | Status: AC
  Administered 2024-04-08: 15 mL via OROMUCOSAL
  Filled 2024-04-08: qty 15

## 2024-04-08 MED ORDER — SUCCINYLCHOLINE CHLORIDE 200 MG/10ML IV SOSY
PREFILLED_SYRINGE | INTRAVENOUS | Status: DC | PRN
  Administered 2024-04-08: 80 mg via INTRAVENOUS

## 2024-04-08 MED ORDER — ONDANSETRON HCL 4 MG/2ML IJ SOLN
INTRAMUSCULAR | Status: DC | PRN
  Administered 2024-04-08: 4 mg via INTRAVENOUS

## 2024-04-08 MED ORDER — PROSOURCE TF20 ENFIT COMPATIBL EN LIQD
60.0000 mL | Freq: Two times a day (BID) | ENTERAL | Status: DC
  Administered 2024-04-08: 60 mL
  Filled 2024-04-08: qty 60

## 2024-04-08 MED ORDER — OSMOLITE 1.5 CAL PO LIQD
1000.0000 mL | ORAL | Status: DC
  Filled 2024-04-08: qty 1000

## 2024-04-08 MED ORDER — FENTANYL CITRATE (PF) 100 MCG/2ML IJ SOLN: 25.0000 ug | INTRAMUSCULAR | Status: DC | PRN

## 2024-04-08 MED ORDER — ORAL CARE MOUTH RINSE: 15.0000 mL | Freq: Once | OROMUCOSAL | Status: AC

## 2024-04-08 MED ORDER — METOPROLOL TARTRATE 25 MG/10 ML ORAL SUSPENSION
62.5000 mg | Freq: Two times a day (BID) | ORAL | Status: DC
  Administered 2024-04-08 – 2024-04-09 (×2): 62.5 mg via JEJUNOSTOMY
  Filled 2024-04-08 (×3): qty 25

## 2024-04-08 MED ORDER — ONDANSETRON HCL 4 MG/2ML IJ SOLN: 4.0000 mg | Freq: Once | INTRAMUSCULAR | Status: DC | PRN

## 2024-04-08 MED ORDER — ALBUTEROL SULFATE (2.5 MG/3ML) 0.083% IN NEBU: 2.5000 mg | INHALATION_SOLUTION | Freq: Four times a day (QID) | RESPIRATORY_TRACT | Status: DC | PRN

## 2024-04-08 MED ORDER — PHENYLEPHRINE HCL (PRESSORS) 10 MG/ML IV SOLN
INTRAVENOUS | Status: DC | PRN
  Administered 2024-04-08: 160 ug via INTRAVENOUS

## 2024-04-08 MED ORDER — RISPERIDONE 1 MG/ML PO SOLN
0.5000 mg | Freq: Every day | ORAL | Status: DC
  Administered 2024-04-08: 0.5 mg
  Filled 2024-04-08 (×2): qty 0.5

## 2024-04-08 MED ORDER — ONDANSETRON HCL 4 MG/2ML IJ SOLN: 4.0000 mg | Freq: Three times a day (TID) | INTRAMUSCULAR | Status: DC | PRN

## 2024-04-08 MED ORDER — INSULIN ASPART 100 UNIT/ML IJ SOLN: 0.0000 [IU] | Freq: Three times a day (TID) | INTRAMUSCULAR | Status: DC

## 2024-04-08 MED ORDER — IOHEXOL 300 MG/ML  SOLN
100.0000 mL | Freq: Once | INTRAMUSCULAR | Status: AC | PRN
  Administered 2024-04-08: 100 mL via ORAL

## 2024-04-08 MED ORDER — LIDOCAINE 2% (20 MG/ML) 5 ML SYRINGE
INTRAMUSCULAR | Status: DC | PRN
Start: 2024-04-08 — End: 2024-04-08
  Administered 2024-04-08: 60 mg via INTRAVENOUS

## 2024-04-08 SURGICAL SUPPLY — 19 items
BUTTON OLYMPUS DEFENDO 5 PIECE (MISCELLANEOUS) ×1 IMPLANT
CANISTER SUCTION 3000ML PPV (SUCTIONS) ×1 IMPLANT
CNTNR URN SCR LID CUP LEK RST (MISCELLANEOUS) ×1 IMPLANT
GAUZE SPONGE 4X4 12PLY STRL (GAUZE/BANDAGES/DRESSINGS) ×1 IMPLANT
GLOVE BIO SURGEON STRL SZ7 (GLOVE) ×1 IMPLANT
GLOVE BIO SURGEON STRL SZ7.5 (GLOVE) ×1 IMPLANT
GOWN STRL REUS W/ TWL XL LVL3 (GOWN DISPOSABLE) ×1 IMPLANT
GOWN STRL SURGICAL XL XLNG (GOWN DISPOSABLE) ×1 IMPLANT
MARKER SKIN DUAL TIP RULER LAB (MISCELLANEOUS) ×1 IMPLANT
OIL SILICONE PENTAX (PARTS (SERVICE/REPAIRS)) IMPLANT
PAD ARMBOARD POSITIONER FOAM (MISCELLANEOUS) ×2 IMPLANT
SOLN 0.9% NACL POUR BTL 1000ML (IV SOLUTION) ×1 IMPLANT
SOLN STERILE WATER BTL 1000 ML (IV SOLUTION) ×1 IMPLANT
SYR 20ML ECCENTRIC (SYRINGE) ×1 IMPLANT
TOWEL GREEN STERILE (TOWEL DISPOSABLE) ×1 IMPLANT
TOWEL GREEN STERILE FF (TOWEL DISPOSABLE) ×1 IMPLANT
TUBE CONNECTING 20X1/4 (TUBING) ×1 IMPLANT
TUBING ENDO SMARTCAP (MISCELLANEOUS) ×1 IMPLANT
UNDERPAD 30X36 HEAVY ABSORB (UNDERPADS AND DIAPERS) ×1 IMPLANT

## 2024-04-08 NOTE — Hospital Course (Signed)
 History of Present Illness:  [name] is 68 yo male known to [facility].  He underwent total gastrectomy and unfortunately required total esophagectomy due to margin involvement.  His post operative course was complicated by anastomotic leak, which was treated with esophageal stent placement and repair with Myriad Mesh.  He was last evaluated by Dr. Shyrl on 04/03/2024 at which time the patient continued to be receiving therapy in a rehab center.  However it was felt he could undergo EGD with possible esophageal stent removal.  The risks and benefits of the procedure were explained to the patient and he was agreeable to proceed.  Hospital Course:  [name] presented to [facility] on 04/08/2024.  He was taken to the operating room and underwent EGD with removal of esophageal stent.  He was admitted for overnight observation.  Esophogram was obtained and showed no evidence of leak.  There was an expected outpouching present.  He will remain on tube feedings at this time. He will be started on a dysphagia 1 diet.  He refused tube feedings during his stay due to diarrhea.  IV Antibiotics per ID through 12/3  IV Meropenem  q8 and Micafungin .  He remains stable and is for discharge back to his facility today.

## 2024-04-08 NOTE — Transfer of Care (Signed)
 Immediate Anesthesia Transfer of Care Note  Patient: [name]  Procedure(s) Performed: ESOPHAGOGASTRODUODENOSCOPY, WITH STENT REMOVAL (Esophagus)  Patient Location: PACU  Anesthesia Type:General  Level of Consciousness: awake, alert , and oriented  Airway & Oxygen Therapy: Patient Spontanous Breathing  Post-op Assessment: Report given to RN and Post -op Vital signs reviewed and stable  Post vital signs: Reviewed and stable  Last Vitals:  Vitals Value Taken Time  BP 129/85 04/08/24 13:03  Temp    Pulse 89 04/08/24 13:04  Resp 20 04/08/24 13:04  SpO2 97 % 04/08/24 13:04  Vitals shown include unfiled device data.  Last Pain:  Vitals:   04/08/24 1012  TempSrc:   PainSc: 9       Patients Stated Pain Goal: 4 (04/08/24 1012)  Complications: No notable events documented.

## 2024-04-08 NOTE — Anesthesia Preprocedure Evaluation (Addendum)
 Anesthesia Evaluation  Patient identified by MRN, date of birth, ID band Patient awake    Reviewed: Allergy & Precautions, NPO status , Patient's Chart, lab work & pertinent test results, reviewed documented beta blocker date and time   History of Anesthesia Complications Negative for: history of anesthetic complications  Airway Mallampati: II  TM Distance: >3 FB Neck ROM: Full    Dental  (+) Missing, Dental Advisory Given   Pulmonary asthma , COPD,  COPD inhaler, former smoker   Pulmonary exam normal        Cardiovascular hypertension, Pt. on medications and Pt. on home beta blockers + CAD  Normal cardiovascular exam+ dysrhythmias Atrial Fibrillation    '25 TTE - EF 55 to 60%. Right ventricular systolic function is hyperdynamic. Trace TR.     Neuro/Psych  Headaches PSYCHIATRIC DISORDERS  Depression     Neuromuscular disease    GI/Hepatic ,GERD  Controlled,,(+) Hepatitis -, C, B Gastric cancer s/p gastroesophagectomy   Endo/Other  diabetes, Type 2, Oral Hypoglycemic Agents    Renal/GU Renal InsufficiencyRenal disease     Musculoskeletal  (+) Arthritis ,    Abdominal   Peds  Hematology  (+) Blood dyscrasia, anemia   Anesthesia Other Findings Chronic pain HSV  Reproductive/Obstetrics                              Anesthesia Physical Anesthesia Plan  ASA: 3  Anesthesia Plan: General   Post-op Pain Management: Minimal or no pain anticipated   Induction: Intravenous  PONV Risk Score and Plan: 2 and Ondansetron , Dexamethasone  and Treatment may vary due to age or medical condition  Airway Management Planned: Oral ETT  Additional Equipment: None  Intra-op Plan:   Post-operative Plan: Extubation in OR  Informed Consent: I have reviewed the patients History and Physical, chart, labs and discussed the procedure including the risks, benefits and alternatives for the proposed  anesthesia with the patient or authorized representative who has indicated his/her understanding and acceptance.     Dental advisory given  Plan Discussed with: CRNA and Anesthesiologist  Anesthesia Plan Comments:          Anesthesia Quick Evaluation

## 2024-04-08 NOTE — Progress Notes (Signed)
 1600 Pt arrived to unit from fluro via OR. Pt oriented to unit. VSS. Pt has left upper arm PICC, right upper chest POC (not accessed) and JG tube with abdominal binder. Pt had sure to right posterior flank which this RN removed. Placed order for WOC and IV team. Ann Nena Hoard, RN

## 2024-04-08 NOTE — Anesthesia Procedure Notes (Signed)
 Procedure Name: Intubation Date/Time: 04/08/2024 12:36 PM  Performed by: Delores Dus, CRNAPre-anesthesia Checklist: Patient identified, Emergency Drugs available, Suction available and Patient being monitored Patient Re-evaluated:Patient Re-evaluated prior to induction Oxygen Delivery Method: Circle system utilized Preoxygenation: Pre-oxygenation with 100% oxygen Induction Type: IV induction Ventilation: Mask ventilation without difficulty Laryngoscope Size: Miller and 2 Grade View: Grade I Tube type: Oral Tube size: 7.0 mm Number of attempts: 1 Airway Equipment and Method: Stylet and Oral airway Placement Confirmation: ETT inserted through vocal cords under direct vision, positive ETCO2 and breath sounds checked- equal and bilateral Secured at: 22 cm Tube secured with: Tape Dental Injury: Teeth and Oropharynx as per pre-operative assessment

## 2024-04-08 NOTE — Consult Note (Signed)
 WOC team consulted  for use of G tube.  No skin breakdown around tube reported.  Secure chat to primary RN that determining appropriateness to use existing G tube is not in the scope of the WOC RN.   Thank you,    Powell Bar MSN, RN-BC, [employer]

## 2024-04-08 NOTE — Op Note (Signed)
      [address] [place] [address]             [phone]      04/08/2024  Patient:  [name] Pre-Op Dx: Hx of gastrectomy Anastomotic leak   Post-op Dx:  same Procedure: - Esophagogastroscopy - Removal of esophageal stent - Esophagram with Omnipaque    Surgeon and Role:      * [name], Jon KIDD, MD - Primary  Anesthesia  general EBL:  0ml Blood Administration: none Specimen:  none   Counts: correct   Indications: 68yo male with hx of gastrectomy with esophago-jejunostomy.  He developed an anastomotic leak that covered with a stent.  He presents today for stent removal.    Findings: The anastomosis was well healed.  There was a small area of granulation tissue at the distal end of the anastomosis.     On table fluoro did not show an obvious leak.  At the anastomosis, there is a pouch of small bowel that protrudes medially.  This pouch was entered and more contrast was injected without evidence of leak.    Operative Technique: After the risks, benefits and alternatives were thoroughly discussed, the patient was brought to the operative theatre.  Anesthesia was induced. The patient was prepped and draped in normal sterile fashion.  An appropriate surgical pause was performed, and pre-operative antibiotics were dosed accordingly.  The gastroscope was advanced through the oropharynx into the cervical esophagus under direct visualization.  The esophageal stent was removed with a grasper.  Contrast was injected through the scope.  No leak was apparent.  There was a blind portion of jejunum that protruded medially, but there was no clear defect or leak here either.   The patient tolerated the procedure without any immediate complications, and was transferred to the PACU in stable condition.  Tationna Fullard KIDD Velasquez

## 2024-04-08 NOTE — Progress Notes (Signed)
 LUE PICC dressing changed/ line pulled back 2.5cm per radiologist recommendation. PICC OK to use.
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
## 2024-04-09 DIAGNOSIS — K91840 Postprocedural hemorrhage and hematoma of a digestive system organ or structure following a digestive system procedure: Secondary | ICD-10-CM | POA: Diagnosis not present

## 2024-04-09 DIAGNOSIS — Z9889 Other specified postprocedural states: Secondary | ICD-10-CM

## 2024-04-09 LAB — GLUCOSE, CAPILLARY: Glucose-Capillary: 97 mg/dL (ref 70–99)

## 2024-04-09 MED ORDER — HEPARIN SOD (PORK) LOCK FLUSH 100 UNIT/ML IV SOLN
250.0000 [IU] | INTRAVENOUS | Status: AC | PRN
  Administered 2024-04-09: 250 [IU]
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
## 2024-04-09 NOTE — Discharge Instructions (Addendum)
 DIET: DYSPHAGIA 1 DO NOT ADVANCE   ANTIBIOTICS PER PICC AS ORDERED BY ID Note from outpatient visit 11/19

## 2024-04-09 NOTE — Progress Notes (Signed)
 Several unsuccessful attempts to call report to [facility] [phone]. Operator transferred calls multiple times. Pt is returning to facility. [facility] transporting pt and aware that report not given. Pt kept overnight for observation from gastric stent removal. Ann Nena Hoard, RN

## 2024-04-09 NOTE — Progress Notes (Addendum)
      [address] [place] [address]             [phone]         1 Day Post-Op Procedure(s) (LRB): ESOPHAGOGASTRODUODENOSCOPY, WITH STENT REMOVAL (N/A)  Subjective:  Patient asking when he can get out of here.  He states the facility said he just needs to call and they will take him back.  He is not receiving tube feedings as ordered as per nursing he refused due to diarrhea.  Objective: Vital signs in last 24 hours: Temp:  [97.5 F (36.4 C)-98.8 F (37.1 C)] 97.5 F (36.4 C) (11/27 0756) Pulse Rate:  [71-109] 71 (11/27 0756) Cardiac Rhythm: Normal sinus rhythm (11/26 1900) Resp:  [13-22] 20 (11/27 0756) BP: (113-143)/(76-100) 141/86 (11/27 0756) SpO2:  [93 %-100 %] 99 % (11/27 0756) Weight:  [76.2 kg] 76.2 kg (11/26 0950)  Intake/Output from previous day: 11/26 0701 - 11/27 0700 In: 600 [I.V.:600] Out: -   General appearance: alert, cooperative, and no distress Heart: regular rate and rhythm Lungs: clear to auscultation bilaterally  Lab Results: Recent Labs    04/08/24 1047  WBC 9.7  HGB 10.3*  HCT 30.4*  PLT 465*   BMET:  Recent Labs    04/08/24 1047  NA 132*  K 3.9  CL 97*  CO2 27  GLUCOSE 125*  BUN 20  CREATININE 0.85  CALCIUM  9.3    PT/INR:  Recent Labs    04/08/24 1047  LABPROT 14.9  INR 1.1   ABG    Component Value Date/Time   PHART 7.377 02/07/2024 0526   HCO3 31.0 (H) 02/07/2024 0526   TCO2 33 (H) 02/07/2024 0526   ACIDBASEDEF 2.0 01/15/2024 1459   O2SAT 100 02/07/2024 0526   CBG (last 3)  Recent Labs    04/08/24 1822 04/08/24 2117 04/09/24 0629  GLUCAP 89 88 97    Assessment/Plan: S/P Procedure(s) (LRB): ESOPHAGOGASTRODUODENOSCOPY, WITH STENT REMOVAL (N/A)  S/P Esophageal stent removal.. esophogram showed no evidence of leak per my review..there is a contained section of colon as expected from surgery.. will discuss starting dysphagia diet with Dr. Daniel  GI- continue tube feedings,  supplements per outpatient regimen Dispo- as discussed with Dr. Shyrl post procedure patient for discharge back to rehab today   LOS: 0 days    Rocky Shad, PA-C 04/09/2024 8:13 AM  No apparent leak on my evaluation of the UGI from yesterday.  Feels well. Ok to start dysphagia diet and plan to discharge back to rehab today.  Con Daniel, MD Cardiothoracic Surgery Pager: [phone]

## 2024-04-09 NOTE — Anesthesia Postprocedure Evaluation (Signed)
 Anesthesia Post Note  Patient: [name]  Procedure(s) Performed: ESOPHAGOGASTRODUODENOSCOPY, WITH STENT REMOVAL (Esophagus)     Patient location during evaluation: PACU Anesthesia Type: General Level of consciousness: sedated and patient cooperative Pain management: pain level controlled Vital Signs Assessment: post-procedure vital signs reviewed and stable Respiratory status: spontaneous breathing Cardiovascular status: stable Anesthetic complications: no   No notable events documented.  Last Vitals:  Vitals:   04/09/24 0732 04/09/24 0756  BP:  (!) 141/86  Pulse: 76 71  Resp: 16 20  Temp:  (!) 36.4 C  SpO2: 97% 99%    Last Pain:  Vitals:   04/09/24 0756  TempSrc: Oral  PainSc:                  [name]

## 2024-04-09 NOTE — TOC Transition Note (Signed)
 Transition of Care [facility]) - Discharge Note   Patient Details  Name: [name] MRN: [id number] Date of Birth: 1956/03/24  [facility] of [facility]) CM/SW Contact:  Montie LOISE Louder, LCSW Phone Number: 04/09/2024, 11:29 AM   Clinical Narrative:     Patient will Discharge to: [facility] Discharge Date: 04/09/2024 Family Notified: sister   Transport By: ROME  Per MD patient is ready for discharge. RN, patient, and facility notified of discharge. Discharge Summary sent to facility. RN given number for [phone]. Ambulance transport requested for patient.   Clinical Social Worker signing off.  Montie Louder, MSW, LCSW Clinical Social Worker     Final next level of care: Skilled Nursing Facility Barriers to Discharge: Barriers Resolved   Patient Goals and CMS Choice            Discharge Placement              Patient chooses bed at:  ([facility]) Patient to be transferred to facility by: [facility] Name of family member notified: sister Patient and family notified of of transfer: 04/09/24  Discharge Plan and Services Additional resources added to the After Visit Summary for                                       Social Drivers of Health (SDOH) Interventions SDOH Screenings   Food Insecurity: Patient Declined (04/09/2024)  Housing: Unknown (04/09/2024)  Transportation Needs: Patient Declined (04/09/2024)  Utilities: Not At Risk (01/28/2024)  Depression (PHQ2-9): Low Risk  (11/25/2023)  Financial Resource Strain: Low Risk  (08/20/2023)  Social Connections: Patient Declined (04/09/2024)  Stress: No Stress Concern Present (08/20/2023)  Tobacco Use: High Risk (04/08/2024)     Readmission Risk Interventions    11/05/2023   12:23 PM  Readmission Risk Prevention Plan  Transportation Screening Complete  PCP or Specialist Appt within 3-5 Days Complete  HRI or Home Care Consult Complete  Social Work Consult for Recovery Care  Planning/Counseling Complete  Palliative Care Screening Complete  Medication Review Oceanographer) Complete

## 2024-04-10 ENCOUNTER — Encounter (HOSPITAL_COMMUNITY): Payer: Self-pay | Admitting: Thoracic Surgery (Cardiothoracic Vascular Surgery)

## 2024-04-14 ENCOUNTER — Telehealth: Payer: Self-pay

## 2024-04-14 NOTE — Telephone Encounter (Signed)
 Contacted Dee with [facility], the facility patient currently resides. Advised that patient can transition to nightly tube feeds and eat Dysphagia I diet during the day per [place], [place]. Advised that patient can also advance to all oral diet if he has enough caloric intake. Graig acknowledged receipt. Faxed information/ orders to facility per [facility] request.

## 2024-04-14 NOTE — Telephone Encounter (Signed)
-----   Message from Rocky Shad sent at 04/14/2024 11:50 AM EST ----- Regarding: RE: Tube feedings/ Dysphagia diet Rosina,  Yes they can transition them to nightly. If they can monitor his calories once he is in taking enough orally tube feedings can stop all together .    Thanks  Rocky Shad ----- Message ----- From: Carvin Rosina FALCON, RN Sent: 04/14/2024  11:43 AM EST To: Rocky JONELLE Shad, PA-C Subject: Tube feedings/ Dysphagia diet                  Hey,  The facility is calling with questions about his tube feedings. They are asking if he can do nocturnal tube feeds and eat during the day?  They had questions about him doing both tube feeds and a dysphagia diet but I advised that the majority of his nutrition will be coming from the tube feeds right now and would advance his diet later as tolerated.  But wanted to make sure his tube feeds were ok to do at night?  Thanks, Rosina

## 2024-04-15 ENCOUNTER — Other Ambulatory Visit: Payer: Self-pay

## 2024-04-15 ENCOUNTER — Encounter: Payer: Self-pay | Admitting: Internal Medicine

## 2024-04-15 ENCOUNTER — Ambulatory Visit: Admitting: Internal Medicine

## 2024-04-15 VITALS — BP 101/65 | HR 73 | Temp 98.1°F

## 2024-04-15 DIAGNOSIS — Z5181 Encounter for therapeutic drug level monitoring: Secondary | ICD-10-CM

## 2024-04-15 DIAGNOSIS — A498 Other bacterial infections of unspecified site: Secondary | ICD-10-CM

## 2024-04-15 DIAGNOSIS — K9429 Other complications of gastrostomy: Secondary | ICD-10-CM

## 2024-04-15 DIAGNOSIS — I951 Orthostatic hypotension: Secondary | ICD-10-CM
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
## 2024-04-15 NOTE — Progress Notes (Signed)
 Labs drawn via PICC line per Dr. Luiz. Line flushed with 10 mL normal saline and clamped. Patient tolerated procedure well.   PICC Removal    PICC length & location: left basilic 44 cm Removed per verbal order from: Dr. Luiz  Blood thinners: lovenox  70mg  BID Platelet count: 465 (04/08/24)  Site assessment: Dressing clean and dry. Extremity warm and dry. No redness, drainage, or swelling present at insertion site.   Pre-removal vital signs:  BP: 101/65 HR: 73 SpO2: 97%  Insertion site positioned below level of heart. No sutures present. Insertion site cleaned with CHG, catheter removed and petroleum dressing applied. Tip intact. Pressure held until hemostasis achieved.    Length of catheter removed: 44 cm   Provided patient with after care instructions and precautions print out (via [employer] Key). Reviewed this information with patient.   Patient verbalized understanding and agreement, all questions answered. Patient tolerated procedure well and remained in clinic under the care of RN 30 minutes post removal.  Post-observation vital signs:  BP: 122/77 HR: 60 SpO2: 98%  Dr. Luiz requested orthostatic vitals and aware of decreased BP upon standing.  Checked patient's PICC dressing, no bleeding present 30 minutes post PICC removal.  Notified SNF and [facility] pharmacy team of removal.  Eniola Cerullo, BSN, RN

## 2024-04-16 ENCOUNTER — Other Ambulatory Visit: Payer: Self-pay

## 2024-04-16 ENCOUNTER — Encounter: Payer: Self-pay | Admitting: Hematology

## 2024-04-16 LAB — BASIC METABOLIC PANEL WITH GFR
BUN: 25 mg/dL (ref 7–25)
CO2: 28 mmol/L (ref 20–32)
Calcium: 9.3 mg/dL (ref 8.6–10.3)
Chloride: 99 mmol/L (ref 98–110)
Creat: 0.75 mg/dL (ref 0.70–1.35)
Glucose, Bld: 93 mg/dL (ref 65–99)
Potassium: 4.4 mmol/L (ref 3.5–5.3)
Sodium: 136 mmol/L (ref 135–146)
eGFR: 99 mL/min/1.73m2 (ref >=60)

## 2024-04-16 LAB — CBC WITH DIFFERENTIAL/PLATELET
Absolute Lymphocytes: 854 {cells}/uL (ref 850–3900)
Absolute Monocytes: 1161 {cells}/uL — ABNORMAL HIGH (ref 200–950)
Basophils Absolute: 80 {cells}/uL (ref 0–200)
Basophils Relative: 1.1 %
Eosinophils Absolute: 533 {cells}/uL — ABNORMAL HIGH (ref 15–500)
Eosinophils Relative: 7.3 %
HCT: 30.6 % — ABNORMAL LOW (ref 39.4–51.1)
Hemoglobin: 9.8 g/dL — ABNORMAL LOW (ref 13.2–17.1)
MCH: 29.4 pg (ref 27.0–33.0)
MCHC: 32 g/dL (ref 31.6–35.4)
MCV: 91.9 fL (ref 81.4–101.7)
MPV: 10.4 fL (ref 7.5–12.5)
Monocytes Relative: 15.9 %
Neutro Abs: 4672 {cells}/uL (ref 1500–7800)
Neutrophils Relative %: 64 %
Platelets: 341 Thousand/uL (ref 140–400)
RBC: 3.33 Million/uL — ABNORMAL LOW (ref 4.20–5.80)
RDW: 17.5 % — ABNORMAL HIGH (ref 11.0–15.0)
Total Lymphocyte: 11.7 %
WBC: 7.3 Thousand/uL (ref 3.8–10.8)

## 2024-04-16 LAB — C-REACTIVE PROTEIN: CRP: <3 mg/L (ref <8.0)

## 2024-04-16 LAB — SEDIMENTATION RATE: Sed Rate: 6 mm/h (ref 0–20)

## 2024-04-23 ENCOUNTER — Other Ambulatory Visit: Payer: Self-pay | Admitting: Thoracic Surgery (Cardiothoracic Vascular Surgery)

## 2024-04-23 DIAGNOSIS — C160 Malignant neoplasm of cardia: Secondary | ICD-10-CM

## 2024-04-24 ENCOUNTER — Ambulatory Visit
Admission: RE | Admit: 2024-04-24 | Discharge: 2024-04-24 | Disposition: A | Source: Ambulatory Visit | Attending: Cardiology | Admitting: Cardiology

## 2024-04-24 ENCOUNTER — Ambulatory Visit: Admitting: Thoracic Surgery (Cardiothoracic Vascular Surgery)

## 2024-04-24 VITALS — BP 124/76 | HR 81 | Resp 18 | Ht 71.0 in | Wt 167.0 lb

## 2024-04-24 DIAGNOSIS — C160 Malignant neoplasm of cardia: Secondary | ICD-10-CM

## 2024-04-24 DIAGNOSIS — Z9889 Other specified postprocedural states: Secondary | ICD-10-CM

## 2024-04-24 DIAGNOSIS — Z9049 Acquired absence of other specified parts of digestive tract: Secondary | ICD-10-CM

## 2024-04-24 NOTE — Progress Notes (Signed)
° °   °  [address] [place] [address]             [phone]       [facility] Medical Record #[id number] Date of Birth: 04-26-1956  Referring: Lanny Callander, MD Primary Care: Sun, Vyvyan, MD Primary Cardiologist:Brian Monetta, MD  Reason for visit:   follow-up  History of Present Illness:     [name] present for follow-up after esophageal stent removal.  He is tolerating a regular diet.  He has no complaints.  Physical Exam: BP 124/76 (BP Location: Right Arm)   Pulse 81   Resp 18   Ht 5' 11 (1.803 m)   Wt 167 lb (75.8 kg)   SpO2 97%   BMI 23.29 kg/m   Alert NAD Abdomen, ND No peripheral edema   Diagnostic Studies & Laboratory data: CXR:  Stable     Assessment / Plan:   68 y.o. male s/p total gastrectomy with esophagojejunostomy.  This was complicated by an anastomotic leak which was covered with a stent.  The stent has since been removed and the patient is tolerating a regular diet.  Will defer to surgical oncology in regards to removal of the jejunostomy tube.  He can follow-up with me as needed.   [name] 04/24/2024 3:42 PM

## 2024-04-27 NOTE — Assessment & Plan Note (Signed)
-  cT2N0M0, stage I, MMR normal  - He presented with heartburn.  EGD showed medium size infiltrative, fungating and frond-like villous, partially circumferential mass in the cardia, biopsy showed at least intramucosal adenocarcinoma. -He underwent EUS on August 28, 2023, which showed T2 lesion, with possible tumor invading GE junction. -He had exploratory laparoscope on August 29, 2023, which was negative for peritoneal metastasis, he had a port placement. -I recommend neoadjuvant chemotherapy FLOT every 2 weeks for 4 cycles, followed by surgery. He started on 09/12/2023.  Chemo stopped after cycle 3 due to poor tolerance and hospital admission. -He underwent a total gastrectomy with distal esophagectomy on 01/15/24. He had an EJ anastomotic leak and had a prolonged hospital course and rehabilitation.

## 2024-04-28 ENCOUNTER — Inpatient Hospital Stay: Attending: Hematology

## 2024-04-28 ENCOUNTER — Inpatient Hospital Stay: Admitting: Hematology

## 2024-04-28 VITALS — BP 96/64 | HR 100 | Temp 97.6°F | Resp 17 | Ht 71.0 in | Wt 171.5 lb

## 2024-04-28 DIAGNOSIS — G8929 Other chronic pain: Secondary | ICD-10-CM | POA: Diagnosis not present

## 2024-04-28 DIAGNOSIS — B159 Hepatitis A without hepatic coma: Secondary | ICD-10-CM | POA: Insufficient documentation

## 2024-04-28 DIAGNOSIS — I951 Orthostatic hypotension: Secondary | ICD-10-CM | POA: Insufficient documentation

## 2024-04-28 DIAGNOSIS — R42 Dizziness and giddiness: Secondary | ICD-10-CM | POA: Insufficient documentation

## 2024-04-28 DIAGNOSIS — Z79891 Long term (current) use of opiate analgesic: Secondary | ICD-10-CM | POA: Insufficient documentation

## 2024-04-28 DIAGNOSIS — E46 Unspecified protein-calorie malnutrition: Secondary | ICD-10-CM | POA: Insufficient documentation

## 2024-04-28 DIAGNOSIS — C160 Malignant neoplasm of cardia: Secondary | ICD-10-CM

## 2024-04-28 DIAGNOSIS — D649 Anemia, unspecified: Secondary | ICD-10-CM | POA: Insufficient documentation

## 2024-04-28 DIAGNOSIS — M549 Dorsalgia, unspecified: Secondary | ICD-10-CM | POA: Insufficient documentation

## 2024-04-28 DIAGNOSIS — J439 Emphysema, unspecified: Secondary | ICD-10-CM | POA: Insufficient documentation

## 2024-04-28 DIAGNOSIS — Z809 Family history of malignant neoplasm, unspecified: Secondary | ICD-10-CM | POA: Diagnosis not present

## 2024-04-28 DIAGNOSIS — Z903 Acquired absence of stomach [part of]: Secondary | ICD-10-CM | POA: Insufficient documentation

## 2024-04-28 DIAGNOSIS — Z8042 Family history of malignant neoplasm of prostate: Secondary | ICD-10-CM | POA: Insufficient documentation

## 2024-04-28 DIAGNOSIS — R296 Repeated falls: Secondary | ICD-10-CM | POA: Diagnosis not present

## 2024-04-28 LAB — COMPREHENSIVE METABOLIC PANEL WITH GFR
ALT: 12 U/L (ref 0–44)
AST: 19 U/L (ref 15–41)
Albumin: 3.3 g/dL — ABNORMAL LOW (ref 3.5–5.0)
Alkaline Phosphatase: 53 U/L (ref 38–126)
Anion gap: 8 (ref 5–15)
BUN: 17 mg/dL (ref 8–23)
CO2: 24 mmol/L (ref 22–32)
Calcium: 8.7 mg/dL — ABNORMAL LOW (ref 8.9–10.3)
Chloride: 105 mmol/L (ref 98–111)
Creatinine, Ser: 1.14 mg/dL (ref 0.61–1.24)
GFR, Estimated: >60 mL/min (ref >60)
Glucose, Bld: 100 mg/dL — ABNORMAL HIGH (ref 70–99)
Potassium: 4.1 mmol/L (ref 3.5–5.1)
Sodium: 138 mmol/L (ref 135–145)
Total Bilirubin: 0.3 mg/dL (ref 0.0–1.2)
Total Protein: 6.3 g/dL — ABNORMAL LOW (ref 6.5–8.1)

## 2024-04-28 LAB — CBC WITH DIFFERENTIAL/PLATELET
Abs Immature Granulocytes: 0.02 K/uL (ref 0.00–0.07)
Basophils Absolute: 0 K/uL (ref 0.0–0.1)
Basophils Relative: 1 %
Eosinophils Absolute: 0.5 K/uL (ref 0.0–0.5)
Eosinophils Relative: 7 %
HCT: 28.5 % — ABNORMAL LOW (ref 39.0–52.0)
Hemoglobin: 9.5 g/dL — ABNORMAL LOW (ref 13.0–17.0)
Immature Granulocytes: 0 %
Lymphocytes Relative: 14 %
Lymphs Abs: 1 K/uL (ref 0.7–4.0)
MCH: 29.3 pg (ref 26.0–34.0)
MCHC: 33.3 g/dL (ref 30.0–36.0)
MCV: 88 fL (ref 80.0–100.0)
Monocytes Absolute: 0.7 K/uL (ref 0.1–1.0)
Monocytes Relative: 11 %
Neutro Abs: 4.7 K/uL (ref 1.7–7.7)
Neutrophils Relative %: 67 %
Platelets: 216 K/uL (ref 150–400)
RBC: 3.24 MIL/uL — ABNORMAL LOW (ref 4.22–5.81)
RDW: 17.2 % — ABNORMAL HIGH (ref 11.5–15.5)
WBC: 6.9 K/uL (ref 4.0–10.5)
nRBC: 0 % (ref 0.0–0.2)

## 2024-04-28 LAB — CEA (ACCESS): CEA (CHCC): 2.56 ng/mL (ref 0.00–5.00)

## 2024-04-28 MED ORDER — ONDANSETRON HCL 8 MG PO TABS: 8.0000 mg | ORAL_TABLET | Freq: Three times a day (TID) | ORAL | 1 refills | Status: AC | PRN
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
## 2024-04-29 ENCOUNTER — Ambulatory Visit: Admitting: Internal Medicine

## 2024-04-29 ENCOUNTER — Other Ambulatory Visit: Payer: Self-pay

## 2024-04-29 ENCOUNTER — Encounter: Payer: Self-pay | Admitting: Internal Medicine

## 2024-04-29 VITALS — BP 86/53 | HR 65 | Temp 97.7°F | Wt 170.0 lb

## 2024-04-29 DIAGNOSIS — K9429 Other complications of gastrostomy: Secondary | ICD-10-CM | POA: Diagnosis not present

## 2024-04-29 DIAGNOSIS — E43 Unspecified severe protein-calorie malnutrition: Secondary | ICD-10-CM | POA: Diagnosis not present

## 2024-04-29 DIAGNOSIS — L03311 Cellulitis of abdominal wall: Secondary | ICD-10-CM | POA: Diagnosis not present

## 2024-04-29 MED ORDER — CEFADROXIL 500 MG PO CAPS: 500.0000 mg | ORAL_CAPSULE | Freq: Two times a day (BID) | ORAL | 0 refills | Status: DC
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
## 2024-05-01 ENCOUNTER — Other Ambulatory Visit: Payer: Self-pay

## 2024-05-15 ENCOUNTER — Other Ambulatory Visit: Payer: Self-pay

## 2024-05-15 DIAGNOSIS — C160 Malignant neoplasm of cardia: Secondary | ICD-10-CM

## 2024-05-15 NOTE — Progress Notes (Signed)
 As per Dr. Lanny, order was successfully placed in portal for Signatera, Kit was taken to lab to be drawn on 01/15. All paper work was uploaded with the order.

## 2024-05-28 ENCOUNTER — Inpatient Hospital Stay

## 2024-05-28 ENCOUNTER — Inpatient Hospital Stay: Attending: Hematology

## 2024-05-28 ENCOUNTER — Inpatient Hospital Stay: Admitting: Hematology

## 2024-05-28 ENCOUNTER — Inpatient Hospital Stay: Admitting: Dietician

## 2024-05-28 ENCOUNTER — Other Ambulatory Visit: Payer: Self-pay

## 2024-05-28 VITALS — BP 127/86 | HR 78 | Temp 97.9°F | Resp 17 | Ht 71.0 in | Wt 160.5 lb

## 2024-05-28 DIAGNOSIS — R42 Dizziness and giddiness: Secondary | ICD-10-CM | POA: Insufficient documentation

## 2024-05-28 DIAGNOSIS — G8929 Other chronic pain: Secondary | ICD-10-CM | POA: Insufficient documentation

## 2024-05-28 DIAGNOSIS — J439 Emphysema, unspecified: Secondary | ICD-10-CM | POA: Diagnosis not present

## 2024-05-28 DIAGNOSIS — Z903 Acquired absence of stomach [part of]: Secondary | ICD-10-CM | POA: Insufficient documentation

## 2024-05-28 DIAGNOSIS — C160 Malignant neoplasm of cardia: Secondary | ICD-10-CM | POA: Diagnosis not present

## 2024-05-28 DIAGNOSIS — M79673 Pain in unspecified foot: Secondary | ICD-10-CM | POA: Insufficient documentation

## 2024-05-28 DIAGNOSIS — H9190 Unspecified hearing loss, unspecified ear: Secondary | ICD-10-CM | POA: Diagnosis not present

## 2024-05-28 DIAGNOSIS — Z8042 Family history of malignant neoplasm of prostate: Secondary | ICD-10-CM | POA: Insufficient documentation

## 2024-05-28 DIAGNOSIS — Z809 Family history of malignant neoplasm, unspecified: Secondary | ICD-10-CM | POA: Insufficient documentation

## 2024-05-28 DIAGNOSIS — M549 Dorsalgia, unspecified: Secondary | ICD-10-CM | POA: Insufficient documentation

## 2024-05-28 DIAGNOSIS — Z79891 Long term (current) use of opiate analgesic: Secondary | ICD-10-CM | POA: Diagnosis not present

## 2024-05-28 LAB — COMPREHENSIVE METABOLIC PANEL WITH GFR
ALT: <5 U/L (ref 0–44)
AST: 16 U/L (ref 15–41)
Albumin: 3.6 g/dL (ref 3.5–5.0)
Alkaline Phosphatase: 48 U/L (ref 38–126)
Anion gap: 9 (ref 5–15)
BUN: 8 mg/dL (ref 8–23)
CO2: 28 mmol/L (ref 22–32)
Calcium: 8.8 mg/dL — ABNORMAL LOW (ref 8.9–10.3)
Chloride: 102 mmol/L (ref 98–111)
Creatinine, Ser: 0.89 mg/dL (ref 0.61–1.24)
GFR, Estimated: >60 mL/min
Glucose, Bld: 98 mg/dL (ref 70–99)
Potassium: 4 mmol/L (ref 3.5–5.1)
Sodium: 139 mmol/L (ref 135–145)
Total Bilirubin: 0.4 mg/dL (ref 0.0–1.2)
Total Protein: 6.5 g/dL (ref 6.5–8.1)

## 2024-05-28 LAB — CBC WITH DIFFERENTIAL/PLATELET
Abs Immature Granulocytes: 0.01 K/uL (ref 0.00–0.07)
Basophils Absolute: 0 K/uL (ref 0.0–0.1)
Basophils Relative: 1 %
Eosinophils Absolute: 0.3 K/uL (ref 0.0–0.5)
Eosinophils Relative: 6 %
HCT: 30.2 % — ABNORMAL LOW (ref 39.0–52.0)
Hemoglobin: 10.2 g/dL — ABNORMAL LOW (ref 13.0–17.0)
Immature Granulocytes: 0 %
Lymphocytes Relative: 23 %
Lymphs Abs: 1.1 K/uL (ref 0.7–4.0)
MCH: 29.4 pg (ref 26.0–34.0)
MCHC: 33.8 g/dL (ref 30.0–36.0)
MCV: 87 fL (ref 80.0–100.0)
Monocytes Absolute: 0.7 K/uL (ref 0.1–1.0)
Monocytes Relative: 15 %
Neutro Abs: 2.5 K/uL (ref 1.7–7.7)
Neutrophils Relative %: 55 %
Platelets: 178 K/uL (ref 150–400)
RBC: 3.47 MIL/uL — ABNORMAL LOW (ref 4.22–5.81)
RDW: 15.5 % (ref 11.5–15.5)
WBC: 4.5 K/uL (ref 4.0–10.5)
nRBC: 0 % (ref 0.0–0.2)

## 2024-05-28 LAB — GENETIC SCREENING ORDER

## 2024-05-28 LAB — CEA (ACCESS): CEA (CHCC): 3.3 ng/mL (ref 0.00–5.00)

## 2024-05-28 NOTE — Assessment & Plan Note (Signed)
-  cT2N0M0, stage I, MMR normal  - He presented with heartburn.  EGD showed medium size infiltrative, fungating and frond-like villous, partially circumferential mass in the cardia, biopsy showed at least intramucosal adenocarcinoma. -He underwent EUS on August 28, 2023, which showed T2 lesion, with possible tumor invading GE junction. -He had exploratory laparoscope on August 29, 2023, which was negative for peritoneal metastasis, he had a port placement. -I recommend neoadjuvant chemotherapy FLOT every 2 weeks for 4 cycles, followed by surgery. He started on 09/12/2023.  Chemo stopped after cycle 3 due to poor tolerance and hospital admission. -He underwent a total gastrectomy with distal esophagectomy on 01/15/24. He had an EJ anastomotic leak and had a prolonged hospital course and rehabilitation.

## 2024-05-28 NOTE — Progress Notes (Signed)
 Planning to see patient for nutrition follow-up after MD visit. Lab appointment was added today at same time of RD visit. RD was not notified. Patient checked out of lab at 2:22 PM and was brought downstairs to see RD. Patient appointment with nutrition was at 1:45 PM. Due to full schedule, RD was with another patient and unable to accommodate. Patient understanding of this. He would prefer to reschedule visit for telephone due to transportation challenges. Nutrition appointment r/s for 1/23 via telephone.
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
## 2024-05-29 DIAGNOSIS — R519 Headache, unspecified: Secondary | ICD-10-CM | POA: Insufficient documentation
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
## 2024-06-04 ENCOUNTER — Encounter: Payer: Self-pay | Admitting: Cardiology

## 2024-06-04 ENCOUNTER — Ambulatory Visit: Admitting: Cardiology

## 2024-06-04 VITALS — BP 104/74 | HR 72 | Ht 71.0 in | Wt 155.2 lb

## 2024-06-04 DIAGNOSIS — I480 Paroxysmal atrial fibrillation: Secondary | ICD-10-CM

## 2024-06-04 DIAGNOSIS — Z7901 Long term (current) use of anticoagulants: Secondary | ICD-10-CM | POA: Diagnosis not present

## 2024-06-04 DIAGNOSIS — I2510 Atherosclerotic heart disease of native coronary artery without angina pectoris: Secondary | ICD-10-CM | POA: Diagnosis not present

## 2024-06-04 DIAGNOSIS — I119 Hypertensive heart disease without heart failure: Secondary | ICD-10-CM

## 2024-06-04 NOTE — Patient Instructions (Addendum)
 KardiaMobile [email or web address]        FDA-cleared, clinical grade mobile EKG monitor: Crist is the most clinically-validated mobile EKG used by the world's leading cardiac care medical professionals With Basic service, know instantly if your heart rhythm is normal or if atrial fibrillation is detected, and email the last single EKG recording to yourself or your doctor Premium service, available for purchase through the Kardia app for $9.99 per month or $99 per year, includes unlimited history and storage of your EKG recordings, a monthly EKG summary report to share with your doctor, along with the ability to track your blood pressure, activity and weight Includes one KardiaMobile phone clip FREE SHIPPING: Standard delivery 1-3 business days. Orders placed by 11:00am PST will ship that afternoon. Otherwise, will ship next business day. All orders ship via [employer] from [place], [place]  Step One- Record your EKG strip on [employer].   Step two- On Kardia EKG click Download   Step three- It will prompt you to make a password for this EKG. Please make the password [email or web address] so that we can view it.   Step four- Click on the little upload button (small box with an arrow in the middle) in the bottom left-hand corner of the screen.   Step five- Click Save to Files  Step six- Click on On my iphone and then Pages then press save in the top right-hand corner.   NOW GO TO MYCHART   Once on MyChart click Messages  Step one- Click Send a message  Step two- Click Ask a medical question   Step three- Click Non urgent medical question   Step four- Click on [facility] name.  Step five- Click on the small paperclip at the bottom of the screen  Step six- Click Choose file  Step seven- Pick the most recent EKG strip listed.   Once uploaded send the message!           Medication Instructions:  Your physician has  recommended you make the following change in your medication:   Stop hydrochlorothiazide   If systolic BP remains less than 899 in 2 weeks please let our office know.  *If you need a refill on your cardiac medications before your next appointment, please call your pharmacy*   Lab Work: None ordered If you have labs (blood work) drawn today and your tests are completely normal, you will receive your results only by: MyChart Message (if you have MyChart) OR A paper copy in the mail If you have any lab test that is abnormal or we need to change your treatment, we will call you to review the results.   Testing/Procedures: None ordered   Follow-Up: At [facility], you and your health needs are our priority.  As part of our continuing mission to provide you with exceptional heart care, we have created designated Provider Care Teams.  These Care Teams include your primary Cardiologist (physician) and Advanced Practice Providers (APPs -  Physician Assistants and Nurse Practitioners) who all work together to provide you with the care you need, when you need it.  We recommend signing up for the patient portal called MyChart.  Sign up information is provided on this After Visit Summary.  MyChart is used to connect with patients for Virtual Visits (Telemedicine).  Patients are able to view lab/test results, encounter notes, upcoming appointments, etc.  Non-urgent messages can be sent to your provider as well.   To learn more about what you can  do with MyChart, go to [email or web address].    Your next appointment:   3 month(s)  The format for your next appointment:   In Person  Provider:   Redell Leiter, MD    Other Instructions none  Important Information About Sugar

## 2024-06-05 ENCOUNTER — Telehealth: Payer: Self-pay | Admitting: Dietician

## 2024-06-05 ENCOUNTER — Inpatient Hospital Stay: Admitting: Dietician

## 2024-06-05 NOTE — Telephone Encounter (Signed)
 Attempted to reach patient by telephone for nutrition follow-up. Patient did not answer. Left VM with request for return call. Contact information provided

## 2024-06-16 LAB — SIGNATERA
SIGNATERA MTM READOUT: 0 MTM/ml
SIGNATERA TEST RESULT: NEGATIVE

## 2024-06-17 ENCOUNTER — Encounter (HOSPITAL_COMMUNITY): Payer: Self-pay

## 2024-06-19 ENCOUNTER — Other Ambulatory Visit: Payer: Self-pay

## 2024-09-02 ENCOUNTER — Ambulatory Visit: Admitting: Cardiology
